# Patient Record
Sex: Female | Born: 1940
Health system: Southern US, Community
[De-identification: ages and names within clinical notes are randomized; demographics above are authoritative.]

## PROBLEM LIST (undated history)

## (undated) DIAGNOSIS — G8929 Other chronic pain: Secondary | ICD-10-CM

## (undated) DIAGNOSIS — M549 Dorsalgia, unspecified: Secondary | ICD-10-CM

## (undated) DIAGNOSIS — C801 Malignant (primary) neoplasm, unspecified: Secondary | ICD-10-CM

## (undated) DIAGNOSIS — N200 Calculus of kidney: Secondary | ICD-10-CM

## (undated) DIAGNOSIS — J90 Pleural effusion, not elsewhere classified: Secondary | ICD-10-CM

## (undated) DIAGNOSIS — F32A Depression, unspecified: Secondary | ICD-10-CM

## (undated) DIAGNOSIS — I5030 Unspecified diastolic (congestive) heart failure: Secondary | ICD-10-CM

## (undated) DIAGNOSIS — J189 Pneumonia, unspecified organism: Secondary | ICD-10-CM

## (undated) DIAGNOSIS — Z923 Personal history of irradiation: Secondary | ICD-10-CM

## (undated) DIAGNOSIS — Z9221 Personal history of antineoplastic chemotherapy: Secondary | ICD-10-CM

## (undated) DIAGNOSIS — I509 Heart failure, unspecified: Secondary | ICD-10-CM

## (undated) DIAGNOSIS — J40 Bronchitis, not specified as acute or chronic: Secondary | ICD-10-CM

## (undated) DIAGNOSIS — E049 Nontoxic goiter, unspecified: Secondary | ICD-10-CM

## (undated) DIAGNOSIS — I499 Cardiac arrhythmia, unspecified: Secondary | ICD-10-CM

## (undated) DIAGNOSIS — F329 Major depressive disorder, single episode, unspecified: Secondary | ICD-10-CM

## (undated) DIAGNOSIS — I4891 Unspecified atrial fibrillation: Secondary | ICD-10-CM

## (undated) DIAGNOSIS — R319 Hematuria, unspecified: Secondary | ICD-10-CM

## (undated) DIAGNOSIS — G4700 Insomnia, unspecified: Secondary | ICD-10-CM

## (undated) DIAGNOSIS — K219 Gastro-esophageal reflux disease without esophagitis: Secondary | ICD-10-CM

## (undated) DIAGNOSIS — E785 Hyperlipidemia, unspecified: Secondary | ICD-10-CM

## (undated) DIAGNOSIS — R42 Dizziness and giddiness: Secondary | ICD-10-CM

## (undated) DIAGNOSIS — I10 Essential (primary) hypertension: Secondary | ICD-10-CM

## (undated) HISTORY — DX: Unspecified atrial fibrillation: I48.91

## (undated) HISTORY — PX: ABDOMINAL HYSTERECTOMY: SHX81

## (undated) HISTORY — DX: Essential (primary) hypertension: I10

## (undated) HISTORY — DX: Insomnia, unspecified: G47.00

## (undated) HISTORY — DX: Hyperlipidemia, unspecified: E78.5

## (undated) HISTORY — PX: TUBAL LIGATION: SHX77

## (undated) HISTORY — PX: CARDIAC VALVE REPLACEMENT: SHX585

## (undated) HISTORY — DX: Hematuria, unspecified: R31.9

## (undated) HISTORY — DX: Pleural effusion, not elsewhere classified: J90

## (undated) HISTORY — DX: Unspecified diastolic (congestive) heart failure: I50.30

## (undated) HISTORY — DX: Nontoxic goiter, unspecified: E04.9

## (undated) HISTORY — PX: BREAST SURGERY: SHX581

---

## 2000-02-24 DIAGNOSIS — C50919 Malignant neoplasm of unspecified site of unspecified female breast: Secondary | ICD-10-CM

## 2000-02-24 HISTORY — PX: BREAST LUMPECTOMY: SHX2

## 2000-02-24 HISTORY — DX: Malignant neoplasm of unspecified site of unspecified female breast: C50.919

## 2007-06-02 ENCOUNTER — Encounter: Payer: Self-pay | Admitting: Family Medicine

## 2009-01-29 ENCOUNTER — Encounter: Payer: Self-pay | Admitting: Family Medicine

## 2009-03-30 ENCOUNTER — Encounter: Payer: Self-pay | Admitting: Family Medicine

## 2009-06-27 ENCOUNTER — Encounter: Payer: Self-pay | Admitting: Family Medicine

## 2009-09-02 ENCOUNTER — Encounter: Payer: Self-pay | Admitting: Family Medicine

## 2009-09-24 ENCOUNTER — Encounter: Payer: Self-pay | Admitting: Family Medicine

## 2010-01-13 ENCOUNTER — Ambulatory Visit: Payer: Self-pay | Admitting: Family Medicine

## 2010-01-13 DIAGNOSIS — I10 Essential (primary) hypertension: Secondary | ICD-10-CM | POA: Insufficient documentation

## 2010-01-13 DIAGNOSIS — Z853 Personal history of malignant neoplasm of breast: Secondary | ICD-10-CM | POA: Insufficient documentation

## 2010-01-13 DIAGNOSIS — G4700 Insomnia, unspecified: Secondary | ICD-10-CM | POA: Insufficient documentation

## 2010-01-13 DIAGNOSIS — E785 Hyperlipidemia, unspecified: Secondary | ICD-10-CM | POA: Insufficient documentation

## 2010-01-22 ENCOUNTER — Encounter: Payer: Self-pay | Admitting: Family Medicine

## 2010-01-29 ENCOUNTER — Encounter: Payer: Self-pay | Admitting: Family Medicine

## 2010-03-25 NOTE — Assessment & Plan Note (Signed)
Summary: NEW PATIENT   Vital Signs:  Patient profile:   70 year old female Menstrual status:  hysterectomy Height:      67 inches Weight:      169 pounds BMI:     26.56 O2 Sat:      97 % on Room air Pulse rate:   86 / minute Pulse rhythm:   regular Resp:     16 per minute BP sitting:   130 / 90  (right arm)  Vitals Entered By: Adella Hare LPN (January 13, 2010 1:54 PM)  Nutrition Counseling: Patient's BMI is greater than 25 and therefore counseled on weight management options.  O2 Flow:  Room air CC: new patient Is Patient Diabetic? No Pain Assessment Patient in pain? no      Comments complains of feet and legs burning     Menstrual Status hysterectomy   CC:  new patient.  History of Present Illness: New pt eval, she has relocated from [place], [place] since August. One urgent care visit since then treated with z pack and home remedies for URI which has resolved. She reports difficulty with sleep but the med she is on is beneficial. She denies uncontrolled depression, but has been having to deal in recent times with the unexpected death of son. she has in the past had depression following a divorce. she states her symptoms are improving with time, espescially she is living with her sisternow. she exercises regularly, and also tries to follow a heart healthy diet. She c/o numbness and burning on the tops of her feetfor several months intermittently.She denies back pain . Pt is a breast cancer survivor, and is on no current meds, she wants to establish with oncology locally.  since the untimely death of her son, the pt had 2 eD visits where she was near syncopal, likely stress related, she has ahd a neg recent card eval  Preventive Screening-Counseling & Management  Alcohol-Tobacco     Smoking Status: never  Caffeine-Diet-Exercise     Does Patient Exercise: no      Drug Use:  no.    Current Medications (verified): 1)  Amlodipine Besy-Benazepril Hcl 5-40 Mg  Caps (Amlodipine Besy-Benazepril Hcl) .... One Cap By Mouth Once Daily 2)  Lipitor 20 Mg Tabs (Atorvastatin Calcium) .... One Tab By Mouth At Bedtime 3)  Arthrotec 75-200 Mg-Mcg Tabs (Diclofenac-Misoprostol) .... One Tab By Mouth Two Times A Day 4)  Temazepam 30 Mg Caps (Temazepam) .... One Cap By Mouth At Bedtime  Allergies (verified): 1)  ! Pcn  Past History:  Past Medical History: Hyperlipidemia  since approx 2000 Hypertension  since approx 2000 Breast ca left dx in 15-Jul-2000, radiation x 30 weeks, chemotherapy x 6 treatments Left axillary and posterior chest pain  since 2000 approx insomnia in 07/15/2004 when she got divorced osteoperosis  Past Surgical History: Left breast lumpectomy removald 8  lymph nodes 2000/07/15 Hysterectomy (partial)  Family History: mother deceased- pulmonary aneurysm died at age 76, epilepsy since age 71 father deceased- COPD deceased in his 59's four sisters- 1 COPD, 1 DM, HTN four brothers- 1 heart dz, 1 DM  Social History: Retired, Solicitor retired in 07-16-1999 Divorced One grown son One deceased son, died in Jul 15, 2009 Never Smoked Alcohol use-no Drug use-no Regular exercise-no Smoking Status:  never Drug Use:  no Does Patient Exercise:  no  Review of Systems      See HPI General:  Complains of sleep disorder. Eyes:  Denies discharge, eye pain, and  red eye. ENT:  Complains of nasal congestion and postnasal drainage; denies sinus pressure and sore throat; mild symptoms. CV:  Denies chest pain or discomfort, palpitations, and swelling of feet. Resp:  Denies cough, shortness of breath, and sputum productive. GI:  Denies abdominal pain, constipation, diarrhea, nausea, and vomiting. GU:  Denies dysuria and urinary frequency. MS:  Denies joint pain and stiffness. Derm:  Denies itching and rash. Neuro:  Complains of tingling; denies headaches, seizures, and sensation of room spinning. Psych:  Complains of depression and easily tearful; denies mental problems,  suicidal thoughts/plans, thoughts of violence, and unusual visions or sounds; grief reaction primarily. Endo:  Denies cold intolerance, excessive hunger, excessive thirst, and excessive urination. Heme:  Denies abnormal bruising and bleeding. Allergy:  Denies hives or rash and itching eyes.  Physical Exam  General:  Well-developed,well-nourished,in no acute distress; alert,appropriate and cooperative throughout examination HEENT: No facial asymmetry,  EOMI, No sinus tenderness, TM's Clear, oropharynx  pink and moist.   Chest: Clear to auscultation bilaterally.  CVS: S1, S2, No murmurs, No S3. Bilateral dorsalis pedis pulses full and normal  Abd: Soft, Nontender.  MS: Adequate ROM spine, hips, shoulders and knees.  Ext: No edema.   CNS: CN 2-12 intact, power tone and sensation normal throughout.   Skin: Intact, no visible lesions or rashes.  Psych: Good eye contact, normal affect.  Memory intact, mildly anxious not depressed appearing.    Impression & Recommendations:  Problem # 1:  INSOMNIA (ICD-780.52) Assessment Comment Only  Her updated medication list for this problem includes:    Temazepam 30 Mg Caps (Temazepam) ..... One cap by mouth at bedtime  Discussed sleep hygiene.   Problem # 2:  BREAST CANCER (ICD-174.9) Assessment: Comment Only  Orders: Oncology Referral (Oncology)establishment only, pt is 5 years out  Problem # 3:  HYPERTENSION (ICD-401.9) Assessment: Comment Only  Her updated medication list for this problem includes:    Amlodipine Besy-benazepril Hcl 5-40 Mg Caps (Amlodipine besy-benazepril hcl) ..... One cap by mouth once daily  Orders: T-Basic Metabolic Panel [phone])  BP today: 130/90 Patient advised to follow low sodium diet rich in fruit and vegetables, and to commit to at least 30 minutes 5 days per week of regular exercise , to improve blood presure control.   Problem # 4:  HYPERLIPIDEMIA (ICD-272.4) Assessment: Comment Only  Her  updated medication list for this problem includes:    Lipitor 20 Mg Tabs (Atorvastatin calcium) ..... One tab by mouth at bedtime  Orders: T-Lipid Profile ([id number]) T-Hepatic Function [id number]) Low fat dietdiscussed and encouraged  Complete Medication List: 1)  Amlodipine Besy-benazepril Hcl 5-40 Mg Caps (Amlodipine besy-benazepril hcl) .... One cap by mouth once daily 2)  Lipitor 20 Mg Tabs (Atorvastatin calcium) .... One tab by mouth at bedtime 3)  Arthrotec 75-200 Mg-mcg Tabs (Diclofenac-misoprostol) .... One tab by mouth two times a day 4)  Temazepam 30 Mg Caps (Temazepam) .... One cap by mouth at bedtime  Other Orders: T- Hemoglobin A1C ([id number]) T-Vitamin D (25-Hydroxy) [phone]) Influenza Vaccine MCR (47425)  Patient Instructions: 1)  CPE  in 4.5 months 2)  BMP prior to visit, ICD-9: 3)  Hepatic Panel prior to visit, ICD-9:  fasting in 2nd week Dec 4)  Lipid Panel prior to visit, ICD-9: 5)  HBA1C , and ViT D level   6)  Pls start calcium with D 1200mg /1000IU one gel capsule once daily. 7)  asprin 81mg  one daily for heart health. 8)  Multivitamin one daily 9)  PLS take your once monthly osteoperosis tablet 10)  Flu vaccine today 11)  You are referred to Dr Cleone Slim Prescriptions: ARTHROTEC 75-200 MG-MCG TABS (DICLOFENAC-MISOPROSTOL) one tab by mouth two times a day  #180 x 0   Entered by:   Adella Hare LPN   Authorized by:   Syliva Overman MD   Signed by:   Adella Hare LPN on 16/11/9602   Method used:   Electronically to        [facility]* (retail)             ,          Ph: [phone]       Fax: [phone]   RxID:   [id number] LIPITOR 20 MG TABS (ATORVASTATIN CALCIUM) one tab by mouth at bedtime  #90 x 0   Entered by:   Adella Hare LPN   Authorized by:   Syliva Overman MD   Signed by:   Adella Hare LPN on 32/44/0102   Method used:   Electronically to        [facility]* (retail)             ,          Ph: [phone]        Fax: [phone]   RxID:   [id number] AMLODIPINE BESY-BENAZEPRIL HCL 5-40 MG CAPS (AMLODIPINE BESY-BENAZEPRIL HCL) one cap by mouth once daily  #90 x 0   Entered by:   Adella Hare LPN   Authorized by:   Syliva Overman MD   Signed by:   Adella Hare LPN on 08/23/1599   Method used:   Electronically to        [facility]* (retail)             ,          Ph: [phone]       Fax: [phone]   RxID:   [id number]    Orders Added: 1)  New Patient 65&> [71062] 2)  T-Basic Metabolic Panel [[id number]] 3)  T-Lipid Profile [35009-38182] 4)  T-Hepatic Function [80076-22960] 5)  T- Hemoglobin A1C [83036-23375] 6)  T-Vitamin D (25-Hydroxy) [99371-69678] 7)  Influenza Vaccine MCR [00025] 8)  Oncology Referral [Oncology] 9)  New Patient Level IV [93810]   Immunizations Administered:  Influenza Vaccine # 1:    Vaccine Type: Fluvax MCR    Site: right deltoid    Mfr: novartis    Dose: 0.5 ml    Route: IM    Given by: Adella Hare LPN    Exp. Date: 06/2010    Lot #: [id number]    VIS given: 09/17/09 version given January 13, 2010.   Immunizations Administered:  Influenza Vaccine # 1:    Vaccine Type: Fluvax MCR    Site: right deltoid    Mfr: novartis    Dose: 0.5 ml    Route: IM    Given by: Adella Hare LPN    Exp. Date: 06/2010    Lot #: [id number]    VIS given: 09/17/09 version given January 13, 2010.

## 2010-03-25 NOTE — Letter (Signed)
Summary: STRESS ECHOCARDIOGRAPHY REPORT  STRESS ECHOCARDIOGRAPHY REPORT   Imported By: Lind Guest 01/22/2010 13:00:02  _____________________________________________________________________  External Attachment:    Type:   Image     Comment:   External Document

## 2010-03-25 NOTE — Letter (Signed)
Summary: progress notes  progress notes   Imported By: Lind Guest 01/22/2010 10:43:46  _____________________________________________________________________  External Attachment:    Type:   Image     Comment:   External Document

## 2010-03-25 NOTE — Letter (Signed)
Summary: TRANSFERRED RECORDS  TRANSFERRED RECORDS   Imported By: Lind Guest 01/22/2010 12:54:45  _____________________________________________________________________  External Attachment:    Type:   Image     Comment:   External Document

## 2010-03-25 NOTE — Letter (Signed)
Summary: consulting radiologists  consulting radiologists   Imported By: Lind Guest 01/22/2010 12:47:50  _____________________________________________________________________  External Attachment:    Type:   Image     Comment:   External Document

## 2010-03-27 NOTE — Letter (Signed)
Summary: [facility]   Imported By: Lind Guest 02/05/2010 10:25:03  _____________________________________________________________________  External Attachment:    Type:   Image     Comment:   External Document

## 2010-04-02 ENCOUNTER — Encounter: Payer: Self-pay | Admitting: Family Medicine

## 2010-04-02 ENCOUNTER — Ambulatory Visit (INDEPENDENT_AMBULATORY_CARE_PROVIDER_SITE_OTHER): Payer: Medicare Other

## 2010-04-02 DIAGNOSIS — R350 Frequency of micturition: Secondary | ICD-10-CM

## 2010-04-02 LAB — CONVERTED CEMR LAB
Bilirubin Urine: NEGATIVE
Glucose, Urine, Semiquant: NEGATIVE
Ketones, urine, test strip: NEGATIVE
Nitrite: NEGATIVE
Protein, U semiquant: NEGATIVE
Specific Gravity, Urine: <1.005
Urobilinogen, UA: 0.2
WBC Urine, dipstick: NEGATIVE
pH: 5.5

## 2010-04-04 ENCOUNTER — Encounter: Payer: Self-pay | Admitting: Family Medicine

## 2010-04-07 ENCOUNTER — Telehealth: Payer: Self-pay | Admitting: Family Medicine

## 2010-04-10 ENCOUNTER — Ambulatory Visit (INDEPENDENT_AMBULATORY_CARE_PROVIDER_SITE_OTHER): Payer: Medicare Other | Admitting: Family Medicine

## 2010-04-10 ENCOUNTER — Encounter: Payer: Self-pay | Admitting: Family Medicine

## 2010-04-10 DIAGNOSIS — I10 Essential (primary) hypertension: Secondary | ICD-10-CM

## 2010-04-10 DIAGNOSIS — N3000 Acute cystitis without hematuria: Secondary | ICD-10-CM | POA: Insufficient documentation

## 2010-04-10 DIAGNOSIS — E785 Hyperlipidemia, unspecified: Secondary | ICD-10-CM

## 2010-04-10 DIAGNOSIS — G4700 Insomnia, unspecified: Secondary | ICD-10-CM

## 2010-04-10 LAB — CONVERTED CEMR LAB
Bilirubin Urine: NEGATIVE
Glucose, Urine, Semiquant: NEGATIVE
Ketones, urine, test strip: NEGATIVE
Nitrite: NEGATIVE
Protein, U semiquant: NEGATIVE
Specific Gravity, Urine: 1.02
Urobilinogen, UA: 0.2
pH: 5.5

## 2010-04-10 NOTE — Assessment & Plan Note (Signed)
Summary: bladder infection   Vital Signs:  Patient profile:   70 year old female Menstrual status:  hysterectomy Weight:      173 pounds BP sitting:   120 / 82 CC: frequent urination Comments patient states she is irritated around private area, advised patient did not sound like urinary symptoms but vaginal, patient states she is already taking a vaginal cream and fluconazole prescribed by cancer doctor and she strongly feels as though she has a uti    CC:  frequent urination.  Allergies: 1)  ! Pcn   Complete Medication List: 1)  Amlodipine Besy-benazepril Hcl 5-40 Mg Caps (Amlodipine besy-benazepril hcl) .... One cap by mouth once daily 2)  Lipitor 20 Mg Tabs (Atorvastatin calcium) .... One tab by mouth at bedtime 3)  Arthrotec 75-200 Mg-mcg Tabs (Diclofenac-misoprostol) .... One tab by mouth two times a day 4)  Temazepam 30 Mg Caps (Temazepam) .... One cap by mouth at bedtime  Other Orders: Urinalysis (75643-32951) T-Culture, Urine ([id number])   Orders Added: 1)  Urinalysis [81003-65000] 2)  T-Culture, Urine [[id number]]    Laboratory Results   Urine Tests  Date/Time Received: April 02, 2010 3:34 PM  Date/Time Reported: April 02, 2010 3:34 PM   Routine Urinalysis   Color: yellow Appearance: Clear Glucose: negative   (Normal Range: Negative) Bilirubin: negative   (Normal Range: Negative) Ketone: negative   (Normal Range: Negative) Spec. Gravity: <1.005   (Normal Range: 1.003-1.035) Blood: trace-intact   (Normal Range: Negative) pH: 5.5   (Normal Range: 5.0-8.0) Protein: negative   (Normal Range: Negative) Urobilinogen: 0.2   (Normal Range: 0-1) Nitrite: negative   (Normal Range: Negative) Leukocyte Esterace: negative   (Normal Range: Negative)    Comments: advised patient no infection, trace blood, will send for culture, in meantime advised to continue cream and pill prescribed by cancer doctor, if no improvement by the first of next week to call  for ov appt Adella Hare LPN  April 04, 2010 8:24 AM

## 2010-04-12 ENCOUNTER — Encounter: Payer: Self-pay | Admitting: Family Medicine

## 2010-04-14 ENCOUNTER — Ambulatory Visit: Payer: Medicare Other | Admitting: Family Medicine

## 2010-04-16 NOTE — Progress Notes (Signed)
Summary: blood in urine  Phone Note Call from Patient   Summary of Call: the nurse told her last week that she had blood in her urine and needs you to call her back at [phone] Initial call taken by: Lind Guest,  April 07, 2010 1:39 PM  Follow-up for Phone Call        states she has vaginal infection, was advised to schedule appt this week Follow-up by: Adella Hare LPN,  April 07, 2010 1:49 PM

## 2010-04-17 ENCOUNTER — Ambulatory Visit: Payer: Medicare Other | Admitting: Family Medicine

## 2010-04-18 ENCOUNTER — Telehealth: Payer: Self-pay | Admitting: Family Medicine

## 2010-04-22 NOTE — Progress Notes (Signed)
Summary: medicine  Phone Note Call from Patient   Summary of Call: the medicine that you gave her for bladder infection she just took the last and needs some more called into [facility] in [place].If any ? please call back at [phone] Initial call taken by: Lind Guest,  April 18, 2010 10:38 AM  Follow-up for Phone Call        pls refill cipro x 1 and let her know she did have a UTI with 2 different organisms, both asensitive to the cipro, pls also tell her to resubmit urine c/s next Tuesday if she feels she is still not doing well, she needs to call and let you know so you can order the test Follow-up by: Syliva Overman MD,  April 18, 2010 3:02 PM  Additional Follow-up for Phone Call Additional follow up Details #1::        patient aware Additional Follow-up by: Adella Hare LPN,  April 18, 2010 4:00 PM    Prescriptions: CIPROFLOXACIN HCL 500 MG TABS (CIPROFLOXACIN HCL) Take 1 tablet by mouth two times a day  #14 x 0   Entered by:   Adella Hare LPN   Authorized by:   Syliva Overman MD   Signed by:   Adella Hare LPN on 41/32/4401   Method used:   Electronically to        [facility] (retail)       [address]       [place], [place]  [address]       Ph: [phone]       Fax: [phone]   RxID:   [id number]

## 2010-04-22 NOTE — Assessment & Plan Note (Signed)
Summary: office visit   Vital Signs:  Patient profile:   70 year old female Menstrual status:  hysterectomy Height:      67 inches Weight:      169 pounds BMI:     26.56 Pulse rate:   80 / minute Pulse rhythm:   regular Resp:     16 per minute BP sitting:   130 / 90  (left arm) Cuff size:   regular  Vitals Entered By: Everitt Amber LPN (April 10, 2010 3:34 PM)  Nutrition Counseling: Patient's BMI is greater than 25 and therefore counseled on weight management options. CC: c/o having a scratchy feeling in her vaginal area, no pain or burning with urination. x 3 weeks and some urinary frequency    CC:  c/o having a scratchy feeling in her vaginal area and no pain or burning with urination. x 3 weeks and some urinary frequency .  History of Present Illness: 3 week h/o dysuria and frequency, no flank paij , fever or chills. Increased tearfullness and grief,this is the 1 year anniversarry of her son's untimely death.  Allergies: 1)  ! Pcn  Review of Systems      See HPI General:  Complains of fatigue, loss of appetite, and sleep disorder. ENT:  Complains of postnasal drainage; denies hoarseness, nasal congestion, and sinus pressure. CV:  Denies chest pain or discomfort, palpitations, and swelling of feet. Resp:  Denies cough and sputum productive. GI:  Denies abdominal pain, constipation, diarrhea, nausea, and vomiting. GU:  Complains of dysuria, hematuria, and urinary frequency. MS:  Denies joint pain and stiffness. Psych:  Complains of depression and easily tearful; denies suicidal thoughts/plans, thoughts of violence, and unusual visions or sounds; acutely grieving the loss of her son who passed 1 year ago in February. Endo:  Denies cold intolerance, excessive hunger, excessive thirst, and excessive urination. Heme:  Denies abnormal bruising and bleeding. Allergy:  Denies hives or rash and itching eyes.  Physical Exam  General:  Well-developed,well-nourished,in no acute  distress; alert,appropriate and cooperative throughout examination HEENT: No facial asymmetry,  EOMI, No sinus tenderness, TM's Clear, oropharynx  pink and moist.   Chest: Clear to auscultation bilaterally.  CVS: S1, S2, No murmurs, No S3.   Abd: Soft, mild suprapubic tenderness, no renal angle tenderness MS: Adequate ROM spine, hips, shoulders and knees.  Ext: No edema.   CNS: CN 2-12 intact, power tone and sensation normal throughout.   Skin: Intact, no visible lesions or rashes.  Psych: Good eye contact, normal affect.  Memory intact,  depressed appearing.    Impression & Recommendations:  Problem # 1:  ACUTE CYSTITIS (ICD-595.0) Assessment Comment Only  Her updated medication list for this problem includes:    Ciprofloxacin Hcl 500 Mg Tabs (Ciprofloxacin hcl) .Marland Kitchen... Take 1 tablet by mouth two times a day  Orders: Medicare Electronic Prescription [phone]) T-Culture, Urine ([id number]) UA Dipstick W/ Micro (manual) (57846)  Encouraged to push clear liquids, get enough rest, and take acetaminophen as needed. To be seen in 10 days if no improvement, sooner if worse.  Problem # 2:  HYPERTENSION (ICD-401.9) Assessment: Deteriorated  Her updated medication list for this problem includes:    Amlodipine Besy-benazepril Hcl 5-40 Mg Caps (Amlodipine besy-benazepril hcl) ..... One cap by mouth once daily  BP today: 130/90 Prior BP: 120/82 (04/02/2010)  Problem # 3:  HYPERLIPIDEMIA (ICD-272.4) Assessment: Comment Only  Her updated medication list for this problem includes:    Lipitor 20 Mg Tabs (Atorvastatin calcium) .Marland KitchenMarland KitchenMarland KitchenMarland Kitchen  One tab by mouth at bedtime needs fasting labs in the near future Low fat dietdiscussed and encouraged  Complete Medication List: 1)  Amlodipine Besy-benazepril Hcl 5-40 Mg Caps (Amlodipine besy-benazepril hcl) .... One cap by mouth once daily 2)  Lipitor 20 Mg Tabs (Atorvastatin calcium) .... One tab by mouth at bedtime 3)  Arthrotec 75-200 Mg-mcg Tabs  (Diclofenac-misoprostol) .... One tab by mouth two times a day 4)  Temazepam 30 Mg Caps (Temazepam) .... One cap by mouth at bedtime 5)  Ciprofloxacin Hcl 500 Mg Tabs (Ciprofloxacin hcl) .... Take 1 tablet by mouth two times a day 6)  Fluconazole 150 Mg Tabs (Fluconazole) .... Take 1 tablet by mouth once a day as needed for vag itching  Patient Instructions: 1)  f/u as before. 2)  you are being treated for acute cystitis (Bladder infection) Antibiotics are sent in, and your urine will be sent for fi-urther testing 3)  Take your antibiotic as prescribed until ALL of it is gone, but stop if you develop a rash or swelling and contact our office as soon as possible. 4)  pls drink alot of fluids/water. 5)  pLs  consider setiously joinng a support group for grieving parents i will give you the contact info. Prescriptions: FLUCONAZOLE 150 MG TABS (FLUCONAZOLE) Take 1 tablet by mouth once a day as needed for vag itching  #3 x 0   Entered by:   Everitt Amber LPN   Authorized by:   Syliva Overman MD   Signed by:   Everitt Amber LPN on 46/96/2952   Method used:   Electronically to        [facility] (retail)       [address]       [place], [place]  [address]       Ph: [phone]       Fax: [phone]   RxID:   [id number] CIPROFLOXACIN HCL 500 MG TABS (CIPROFLOXACIN HCL) Take 1 tablet by mouth two times a day  #14 x 0   Entered by:   Everitt Amber LPN   Authorized by:   Syliva Overman MD   Signed by:   Everitt Amber LPN on 88/41/6606   Method used:   Electronically to        [facility] (retail)       [address]       [place], [place]  [address]       Ph: [phone]       Fax: [phone]   RxID:   [id number]    Orders Added: 1)  Est. Patient Level IV [37106] 2)  Medicare Electronic Prescription [G8553] 3)  T-Culture, Urine [[id number]] 4)  UA Dipstick W/ Micro (manual) [81000]    Laboratory Results     Urine Tests    Routine Urinalysis   Color: lt. yellow Appearance: Clear Glucose: negative   (Normal Range: Negative) Bilirubin: negative   (Normal Range: Negative) Ketone: negative   (Normal Range: Negative) Spec. Gravity: 1.020   (Normal Range: 1.003-1.035) Blood: trace-intact   (Normal Range: Negative) pH: 5.5   (Normal Range: 5.0-8.0) Protein: negative   (Normal Range: Negative) Urobilinogen: 0.2   (Normal Range: 0-1) Nitrite: negative   (Normal Range: Negative) Leukocyte Esterace: trace   (Normal Range: Negative)

## 2010-05-01 ENCOUNTER — Telehealth: Payer: Self-pay | Admitting: Family Medicine

## 2010-05-06 NOTE — Progress Notes (Signed)
Summary: yeast infection  Phone Note Call from Patient   Summary of Call: yeast infection has came back could you please call in some medicine that is a little different at [facility] Initial call taken by: Lind Guest,  May 01, 2010 2:35 PM  Follow-up for Phone Call        does she want a cream to insert???, that is the prescription tab, where is the infectiopn, mouthor vaginal canal???let her know med will be sent tomorrow Follow-up by: Syliva Overman MD,  May 01, 2010 3:52 PM  Additional Follow-up for Phone Call Additional follow up Details #1::        She just wanted some cream, not to insert, but to rub on the outside. Doesn't burn when she urinates but just has been bothering her (irritated) in that area. [facility] Additional Follow-up by: Everitt Amber LPN,  May 01, 2010 4:00 PM    Additional Follow-up for Phone Call Additional follow up Details #2::    otc monistat is recommended for yeast on the skin, no script needed pls let her know Follow-up by: Syliva Overman MD,  May 02, 2010 4:56 AM  Additional Follow-up for Phone Call Additional follow up Details #3:: Details for Additional Follow-up Action Taken: Patient aware Additional Follow-up by: Everitt Amber LPN,  May 02, 2010 7:57 AM

## 2010-06-04 ENCOUNTER — Telehealth: Payer: Self-pay | Admitting: Family Medicine

## 2010-06-04 MED ORDER — TEMAZEPAM 30 MG PO CAPS: 30.0000 mg | ORAL_CAPSULE | Freq: Every evening | ORAL | Status: DC | PRN

## 2010-06-04 NOTE — Telephone Encounter (Signed)
Sent as requested.

## 2010-06-10 ENCOUNTER — Encounter: Payer: Self-pay | Admitting: Family Medicine

## 2010-06-11 ENCOUNTER — Ambulatory Visit (INDEPENDENT_AMBULATORY_CARE_PROVIDER_SITE_OTHER): Payer: Medicare Other | Admitting: Family Medicine

## 2010-06-11 ENCOUNTER — Other Ambulatory Visit: Payer: Self-pay | Admitting: Family Medicine

## 2010-06-11 ENCOUNTER — Telehealth: Payer: Self-pay | Admitting: Family Medicine

## 2010-06-11 ENCOUNTER — Encounter: Payer: Self-pay | Admitting: Family Medicine

## 2010-06-11 DIAGNOSIS — Z13820 Encounter for screening for osteoporosis: Secondary | ICD-10-CM

## 2010-06-11 DIAGNOSIS — Z139 Encounter for screening, unspecified: Secondary | ICD-10-CM

## 2010-06-11 DIAGNOSIS — I10 Essential (primary) hypertension: Secondary | ICD-10-CM

## 2010-06-11 DIAGNOSIS — R5381 Other malaise: Secondary | ICD-10-CM

## 2010-06-11 DIAGNOSIS — R5383 Other fatigue: Secondary | ICD-10-CM

## 2010-06-11 DIAGNOSIS — E785 Hyperlipidemia, unspecified: Secondary | ICD-10-CM

## 2010-06-11 MED ORDER — TEMAZEPAM 30 MG PO CAPS: 30.0000 mg | ORAL_CAPSULE | Freq: Every evening | ORAL | Status: DC | PRN

## 2010-06-11 NOTE — Patient Instructions (Signed)
Mammogram due September 03, 2010 or after, we will schedule.   Benadryl is good for allergies if excessive, I suggest you take at bedtime since this has a sedative effect.  Pls dicontinue daily use of arthrotec, your pain is fleeting and intermittent.Use only if needed  Pls start calcium with D 1200mg /1000IU one daily , gel capsule , this is good for your bones  One multivitamin once daily is recommended   CBC , fasting chem 7 , lipid , hepatic , Vit D and tSH  As soon as possible  F/u in 5 months

## 2010-06-12 NOTE — Telephone Encounter (Signed)
Noted, thanks!

## 2010-06-15 NOTE — Progress Notes (Signed)
  Subjective:    Patient ID: Theresa Garcia, female    DOB: [birth year], 70 y.o.   MRN: [id number]  HPI The PT is here for follow up and re-evaluation of chronic medical conditions, medication management and review of recent lab and radiology data.  Preventive health is updated, specifically  Cancer screening, Osteoporosis screening and Immunization.   Questions or concerns regarding consultations or procedures which the PT has had in the interim are  addressed. The PT denies any adverse reactions to current medications since the last visit.  There are no new concerns.  There are no specific complaints       Review of Systems Denies recent fever or chills. Denies sinus pressure, nasal congestion, ear pain or sore throat. Denies chest congestion, productive cough or wheezing. Denies chest pains, palpitations, paroxysmal nocturnal dyspnea, orthopnea and leg swelling Denies abdominal pain, nausea, vomiting,diarrhea or constipation.  Denies rectal bleeding or change in bowel movement. Denies dysuria, frequency, hesitancy or incontinence. Denies joint pain, swelling and limitation and mobility. Denies headaches, seizure, numbness, or tingling. Denies depression, anxiety or insomnia. Denies skin break down or rash.        Objective:   Physical Exam Patient alert and oriented and in no Cardiopulmonary distress.  HEENT: No facial asymmetry, EOMI, no sinus tenderness, TM's clear, Oropharynx pink and moist.  Neck supple no adenopathy.  Chest: Clear to auscultation bilaterally.  CVS: S1, S2 no murmurs, no S3.  ABD: Soft non tender. Bowel sounds normal.  Ext: No edema  MS: Adequate ROM spine, shoulders, hips and knees.  Skin: Intact, no ulcerations or rash noted.  Psych: Good eye contact, normal affect. Memory intact not anxious or depressed appearing.  CNS: CN 2-12 intact, power, tone and sensation normal throughout.        Assessment & Plan:    1.Hypertension:Controlled, no changes in medication.  2. Hyperlipidemia: ;labs to be obtained, low fat diet encouraged 3. Depression and grief: improving over time, excellent family support 4.Allergies: increased , otc benadryl as needed Insomnia: improved and controlled wit medication

## 2010-06-20 ENCOUNTER — Encounter: Payer: Self-pay | Admitting: Family Medicine

## 2010-06-23 ENCOUNTER — Encounter: Payer: Self-pay | Admitting: Family Medicine

## 2010-08-06 ENCOUNTER — Telehealth: Payer: Self-pay | Admitting: Family Medicine

## 2010-08-06 MED ORDER — ATORVASTATIN CALCIUM 20 MG PO TABS: ORAL_TABLET | ORAL | Status: DC

## 2010-08-06 NOTE — Telephone Encounter (Signed)
Sent to [facility] 

## 2010-09-08 ENCOUNTER — Ambulatory Visit (HOSPITAL_COMMUNITY): Payer: Medicare Other

## 2010-11-10 ENCOUNTER — Encounter: Payer: Self-pay | Admitting: Family Medicine

## 2010-11-11 ENCOUNTER — Encounter: Payer: Self-pay | Admitting: Family Medicine

## 2010-11-11 ENCOUNTER — Ambulatory Visit: Payer: Medicare Other | Admitting: Family Medicine

## 2010-11-21 ENCOUNTER — Encounter: Payer: Self-pay | Admitting: Family Medicine

## 2010-11-24 ENCOUNTER — Ambulatory Visit (INDEPENDENT_AMBULATORY_CARE_PROVIDER_SITE_OTHER): Payer: Medicare Other | Admitting: Family Medicine

## 2010-11-24 ENCOUNTER — Encounter: Payer: Self-pay | Admitting: Family Medicine

## 2010-11-24 DIAGNOSIS — I10 Essential (primary) hypertension: Secondary | ICD-10-CM

## 2010-11-24 DIAGNOSIS — M899 Disorder of bone, unspecified: Secondary | ICD-10-CM

## 2010-11-24 DIAGNOSIS — R5383 Other fatigue: Secondary | ICD-10-CM

## 2010-11-24 DIAGNOSIS — E785 Hyperlipidemia, unspecified: Secondary | ICD-10-CM

## 2010-11-24 DIAGNOSIS — G4700 Insomnia, unspecified: Secondary | ICD-10-CM

## 2010-11-24 DIAGNOSIS — J40 Bronchitis, not specified as acute or chronic: Secondary | ICD-10-CM

## 2010-11-24 DIAGNOSIS — R5381 Other malaise: Secondary | ICD-10-CM

## 2010-11-24 LAB — CBC WITH DIFFERENTIAL/PLATELET
Basophils Absolute: 0 10*3/uL (ref 0.0–0.1)
Basophils Relative: 0 % (ref 0–1)
Eosinophils Absolute: 0.4 10*3/uL (ref 0.0–0.7)
Eosinophils Relative: 4 % (ref 0–5)
HCT: 41.5 % (ref 36.0–46.0)
Hemoglobin: 13.3 g/dL (ref 12.0–15.0)
Lymphocytes Relative: 26 % (ref 12–46)
Lymphs Abs: 2.3 10*3/uL (ref 0.7–4.0)
MCH: 30.5 pg (ref 26.0–34.0)
MCHC: 32 g/dL (ref 30.0–36.0)
MCV: 95.2 fL (ref 78.0–100.0)
Monocytes Absolute: 0.5 10*3/uL (ref 0.1–1.0)
Monocytes Relative: 5 % (ref 3–12)
Neutro Abs: 5.9 10*3/uL (ref 1.7–7.7)
Neutrophils Relative %: 65 % (ref 43–77)
Platelets: 337 10*3/uL (ref 150–400)
RBC: 4.36 MIL/uL (ref 3.87–5.11)
RDW: 13.9 % (ref 11.5–15.5)
WBC: 9.1 10*3/uL (ref 4.0–10.5)

## 2010-11-24 LAB — LIPID PANEL
Cholesterol: 179 mg/dL (ref 0–200)
HDL: 52 mg/dL (ref >39)
LDL Cholesterol: 106 mg/dL — ABNORMAL HIGH (ref 0–99)
Total CHOL/HDL Ratio: 3.4 Ratio
Triglycerides: 106 mg/dL (ref <150)
VLDL: 21 mg/dL (ref 0–40)

## 2010-11-24 LAB — HEPATIC FUNCTION PANEL
ALT: 21 U/L (ref 0–35)
AST: 24 U/L (ref 0–37)
Albumin: 4.4 g/dL (ref 3.5–5.2)
Alkaline Phosphatase: 58 U/L (ref 39–117)
Bilirubin, Direct: 0.1 mg/dL (ref 0.0–0.3)
Indirect Bilirubin: 0.4 mg/dL (ref 0.0–0.9)
Total Bilirubin: 0.5 mg/dL (ref 0.3–1.2)
Total Protein: 7.3 g/dL (ref 6.0–8.3)

## 2010-11-24 LAB — TSH: TSH: 0.656 u[IU]/mL (ref 0.350–4.500)

## 2010-11-24 MED ORDER — FLUCONAZOLE 150 MG PO TABS: 150.0000 mg | ORAL_TABLET | Freq: Once | ORAL | Status: AC

## 2010-11-24 MED ORDER — SULFAMETHOXAZOLE-TRIMETHOPRIM 800-160 MG PO TABS: 1.0000 | ORAL_TABLET | Freq: Two times a day (BID) | ORAL | Status: AC

## 2010-11-24 MED ORDER — BENZONATATE 100 MG PO CAPS: 100.0000 mg | ORAL_CAPSULE | Freq: Four times a day (QID) | ORAL | Status: DC | PRN

## 2010-11-24 NOTE — Assessment & Plan Note (Signed)
Controlled pt also practices good sleep hygiene

## 2010-11-24 NOTE — Progress Notes (Signed)
  Subjective:    Patient ID: Theresa Garcia, female    DOB: 1940-10-27, 70 y.o.   MRN: [id number]  HPI 2 week h/o head and chest congestion, sputum is cream and thick, she has post nasal drainage which is foull tasting and smelling, she has had intermittent chills, low grade temp last week. Otherwise has been fairly well Labs still outstanding but will get them today.      Review of Systems See HPI  Denies chest pains, palpitations and leg swelling Denies abdominal pain, nausea, vomiting,diarrhea or constipation.   Denies dysuria, frequency, hesitancy or incontinence. Denies joint pain, swelling and limitation in mobility. Denies headaches, seizures, numbness, or tingling. Denies depression, anxiety or insomnia. Denies skin break down or rash.        Objective:   Physical Exam Patient alert and oriented and in no cardiopulmonary distress.  HEENT: No facial asymmetry, EOMI, frontal and maxillary sinus tenderness,  oropharynx pink and moist.  Neck supple no adenopathy.  Chest: decreased air entry bilateral crackles and wheezes  CVS: S1, S2 no murmurs, no S3.  ABD: Soft non tender. Bowel sounds normal.  Ext: No edema  MS: Adequate ROM spine, shoulders, hips and knees.  Skin: Intact, no ulcerations or rash noted.  Psych: Good eye contact, normal affect. Memory intact not anxious or depressed appearing.  CNS: CN 2-12 intact, power, tone and sensation normal throughout.        Assessment & Plan:

## 2010-11-24 NOTE — Assessment & Plan Note (Signed)
Uncontrolled. Medication compliance addressed. Commitment to regular exercise, and healthy  eating habits with portion control discussed. DASH diet, and low fat diet discussed, and literature offered. No changes in medication at this time.  

## 2010-11-24 NOTE — Patient Instructions (Addendum)
F/u in 4 months.  Nurse visit for flu vaccine in 3 to 4weeks  You are being treated for acute bronchitis and sinusiits, medication is sent to your pharmacy, call if symptoms persist after approx 10 days   Fasting labs today.  No changes in regular medications  Blood pressure is high today, pls follow the DASH diet, and start daily exercise when you feel better

## 2010-11-24 NOTE — Assessment & Plan Note (Signed)
2 week h/o head congestion and productive cough, will prescribe med

## 2010-11-24 NOTE — Assessment & Plan Note (Signed)
Low fat diet encouraged labs today

## 2010-11-25 LAB — VITAMIN D 25 HYDROXY (VIT D DEFICIENCY, FRACTURES): Vit D, 25-Hydroxy: 20 ng/mL — ABNORMAL LOW (ref 30–89)

## 2010-11-28 ENCOUNTER — Other Ambulatory Visit: Payer: Self-pay

## 2010-11-28 MED ORDER — ATORVASTATIN CALCIUM 20 MG PO TABS: ORAL_TABLET | ORAL | Status: DC

## 2010-11-28 MED ORDER — SUCRALFATE 1 G PO TABS: ORAL_TABLET | ORAL | Status: DC

## 2010-11-28 MED ORDER — TEMAZEPAM 30 MG PO CAPS: 30.0000 mg | ORAL_CAPSULE | Freq: Every evening | ORAL | Status: DC | PRN

## 2010-11-28 MED ORDER — AMLODIPINE BESY-BENAZEPRIL HCL 5-40 MG PO CAPS: 1.0000 | ORAL_CAPSULE | Freq: Every day | ORAL | Status: DC

## 2010-12-01 ENCOUNTER — Other Ambulatory Visit: Payer: Self-pay | Admitting: Family Medicine

## 2010-12-02 ENCOUNTER — Telehealth: Payer: Self-pay | Admitting: *Deleted

## 2010-12-02 NOTE — Telephone Encounter (Signed)
Patient aware of lab results.

## 2010-12-02 NOTE — Telephone Encounter (Signed)
Message copied by Diamantina Monks on Tue Dec 02, 2010 10:26 AM ------      Message from: Syliva Overman MD E      Created: Mon Dec 01, 2010 10:35 AM       pls advise labs are good, except bad cholesterol a little too high, cut back on fried and fatty foods, no med changes.      Her vit D is low needs once weekly supplement, for at least 24  weeks then rept test, pls order the vit D       She needs to ensure she take calcium 1200mg  once daily and vit D 800IU once daily.      I am sending in the vit D, let her know

## 2010-12-18 ENCOUNTER — Ambulatory Visit (INDEPENDENT_AMBULATORY_CARE_PROVIDER_SITE_OTHER): Payer: Medicare Other

## 2010-12-18 DIAGNOSIS — Z23 Encounter for immunization: Secondary | ICD-10-CM

## 2011-01-05 ENCOUNTER — Encounter: Payer: Self-pay | Admitting: Family Medicine

## 2011-01-06 ENCOUNTER — Ambulatory Visit (INDEPENDENT_AMBULATORY_CARE_PROVIDER_SITE_OTHER): Payer: Medicare Other | Admitting: Family Medicine

## 2011-01-06 ENCOUNTER — Encounter (HOSPITAL_COMMUNITY): Payer: Self-pay | Admitting: Unknown Physician Specialty

## 2011-01-06 ENCOUNTER — Inpatient Hospital Stay (HOSPITAL_COMMUNITY)
Admission: AD | Admit: 2011-01-06 | Discharge: 2011-01-08 | DRG: 669 | Disposition: A | Discharge status: Home / Self Care | Payer: Medicare Other | Source: Ambulatory Visit | Attending: Internal Medicine | Admitting: Internal Medicine

## 2011-01-06 ENCOUNTER — Encounter: Payer: Self-pay | Admitting: Family Medicine

## 2011-01-06 VITALS — BP 150/90 | HR 74 | Resp 16 | Ht 67.0 in | Wt 175.0 lb

## 2011-01-06 DIAGNOSIS — J40 Bronchitis, not specified as acute or chronic: Secondary | ICD-10-CM

## 2011-01-06 DIAGNOSIS — N83209 Unspecified ovarian cyst, unspecified side: Secondary | ICD-10-CM | POA: Diagnosis present

## 2011-01-06 DIAGNOSIS — N3000 Acute cystitis without hematuria: Secondary | ICD-10-CM

## 2011-01-06 DIAGNOSIS — I10 Essential (primary) hypertension: Secondary | ICD-10-CM

## 2011-01-06 DIAGNOSIS — G4700 Insomnia, unspecified: Secondary | ICD-10-CM | POA: Diagnosis present

## 2011-01-06 DIAGNOSIS — Z853 Personal history of malignant neoplasm of breast: Secondary | ICD-10-CM | POA: Diagnosis present

## 2011-01-06 DIAGNOSIS — E785 Hyperlipidemia, unspecified: Secondary | ICD-10-CM | POA: Diagnosis present

## 2011-01-06 DIAGNOSIS — N201 Calculus of ureter: Secondary | ICD-10-CM

## 2011-01-06 DIAGNOSIS — N23 Unspecified renal colic: Secondary | ICD-10-CM

## 2011-01-06 DIAGNOSIS — R112 Nausea with vomiting, unspecified: Secondary | ICD-10-CM | POA: Diagnosis present

## 2011-01-06 DIAGNOSIS — E860 Dehydration: Secondary | ICD-10-CM | POA: Diagnosis present

## 2011-01-06 DIAGNOSIS — C50919 Malignant neoplasm of unspecified site of unspecified female breast: Secondary | ICD-10-CM

## 2011-01-06 DIAGNOSIS — N1330 Unspecified hydronephrosis: Secondary | ICD-10-CM | POA: Diagnosis present

## 2011-01-06 HISTORY — DX: Pneumonia, unspecified organism: J18.9

## 2011-01-06 HISTORY — DX: Malignant (primary) neoplasm, unspecified: C80.1

## 2011-01-06 HISTORY — DX: Gastro-esophageal reflux disease without esophagitis: K21.9

## 2011-01-06 HISTORY — DX: Bronchitis, not specified as acute or chronic: J40

## 2011-01-06 LAB — COMPREHENSIVE METABOLIC PANEL
ALT: 11 U/L (ref 0–35)
AST: 14 U/L (ref 0–37)
Albumin: 3.7 g/dL (ref 3.5–5.2)
Alkaline Phosphatase: 62 U/L (ref 39–117)
BUN: 4 mg/dL — ABNORMAL LOW (ref 6–23)
CO2: 25 mEq/L (ref 19–32)
Calcium: 10.2 mg/dL (ref 8.4–10.5)
Chloride: 108 mEq/L (ref 96–112)
Creatinine, Ser: 0.78 mg/dL (ref 0.50–1.10)
GFR calc Af Amer: >90 mL/min (ref >90)
GFR calc non Af Amer: 85 mL/min — ABNORMAL LOW (ref >90)
Glucose, Bld: 99 mg/dL (ref 70–99)
Potassium: 3.7 mEq/L (ref 3.5–5.1)
Sodium: 143 mEq/L (ref 135–145)
Total Bilirubin: 0.2 mg/dL — ABNORMAL LOW (ref 0.3–1.2)
Total Protein: 7.6 g/dL (ref 6.0–8.3)

## 2011-01-06 LAB — URINALYSIS, ROUTINE W REFLEX MICROSCOPIC
Bilirubin Urine: NEGATIVE
Glucose, UA: NEGATIVE mg/dL
Hgb urine dipstick: NEGATIVE
Ketones, ur: NEGATIVE mg/dL
Leukocytes, UA: NEGATIVE
Nitrite: NEGATIVE
Protein, ur: NEGATIVE mg/dL
Specific Gravity, Urine: 1.015 (ref 1.005–1.030)
Urobilinogen, UA: 0.2 mg/dL (ref 0.0–1.0)
pH: 6 (ref 5.0–8.0)

## 2011-01-06 LAB — CBC
HCT: 39.3 % (ref 36.0–46.0)
Hemoglobin: 12.8 g/dL (ref 12.0–15.0)
MCH: 30.3 pg (ref 26.0–34.0)
MCHC: 32.6 g/dL (ref 30.0–36.0)
MCV: 93.1 fL (ref 78.0–100.0)
Platelets: 290 10*3/uL (ref 150–400)
RBC: 4.22 MIL/uL (ref 3.87–5.11)
RDW: 13.3 % (ref 11.5–15.5)
WBC: 7.4 10*3/uL (ref 4.0–10.5)

## 2011-01-06 LAB — DIFFERENTIAL
Basophils Absolute: 0 10*3/uL (ref 0.0–0.1)
Basophils Relative: 0 % (ref 0–1)
Eosinophils Absolute: 0.1 10*3/uL (ref 0.0–0.7)
Eosinophils Relative: 1 % (ref 0–5)
Lymphocytes Relative: 32 % (ref 12–46)
Lymphs Abs: 2.4 10*3/uL (ref 0.7–4.0)
Monocytes Absolute: 0.4 10*3/uL (ref 0.1–1.0)
Monocytes Relative: 6 % (ref 3–12)
Neutro Abs: 4.5 10*3/uL (ref 1.7–7.7)
Neutrophils Relative %: 61 % (ref 43–77)

## 2011-01-06 MED ORDER — ACETAMINOPHEN 325 MG PO TABS: 650.0000 mg | ORAL_TABLET | Freq: Four times a day (QID) | ORAL | Status: DC | PRN

## 2011-01-06 MED ORDER — ONDANSETRON HCL 4 MG PO TABS: 4.0000 mg | ORAL_TABLET | Freq: Four times a day (QID) | ORAL | Status: DC | PRN

## 2011-01-06 MED ORDER — SODIUM CHLORIDE 0.9 % IJ SOLN
INTRAMUSCULAR | Status: AC
  Filled 2011-01-06: qty 3

## 2011-01-06 MED ORDER — ACETAMINOPHEN 650 MG RE SUPP: 650.0000 mg | Freq: Four times a day (QID) | RECTAL | Status: DC | PRN

## 2011-01-06 MED ORDER — SODIUM CHLORIDE 0.9 % IV SOLN
INTRAVENOUS | Status: DC
  Administered 2011-01-06 – 2011-01-07 (×2): via INTRAVENOUS

## 2011-01-06 MED ORDER — HYDROMORPHONE HCL PF 1 MG/ML IJ SOLN
1.0000 mg | INTRAMUSCULAR | Status: DC | PRN
  Administered 2011-01-06 – 2011-01-08 (×4): 1 mg via INTRAVENOUS
  Filled 2011-01-06 (×4): qty 1

## 2011-01-06 MED ORDER — ONDANSETRON HCL 4 MG/2ML IJ SOLN
4.0000 mg | Freq: Four times a day (QID) | INTRAMUSCULAR | Status: DC | PRN
  Administered 2011-01-06 – 2011-01-07 (×2): 4 mg via INTRAVENOUS
  Filled 2011-01-06 (×2): qty 2

## 2011-01-06 MED ORDER — ALUM & MAG HYDROXIDE-SIMETH 200-200-20 MG/5ML PO SUSP: 30.0000 mL | Freq: Four times a day (QID) | ORAL | Status: DC | PRN

## 2011-01-06 MED ORDER — CIPROFLOXACIN IN D5W 200 MG/100ML IV SOLN
200.0000 mg | INTRAVENOUS | Status: DC
  Filled 2011-01-06: qty 100

## 2011-01-06 MED ORDER — SODIUM CHLORIDE 0.9 % IV BOLUS (SEPSIS)
500.0000 mL | Freq: Once | INTRAVENOUS | Status: AC
  Administered 2011-01-06: 500 mL via INTRAVENOUS

## 2011-01-06 MED ORDER — SENNA 8.6 MG PO TABS: 2.0000 | ORAL_TABLET | Freq: Every day | ORAL | Status: DC | PRN

## 2011-01-06 NOTE — Discharge Summary (Signed)
NAMEMarland Kitchen  ASHANTY, COLTRANE              ACCOUNT NO.:  [id number]  MEDICAL RECORD NO.:  [id number]  LOCATION:  A340                          FACILITY:  [facility]  PHYSICIAN:  Ky Barban, M.D.DATE OF BIRTH:  04-12-1940  DATE OF ADMISSION:  01/06/2011 DATE OF DISCHARGE:  LH                         DISCHARGE SUMMARY-REFERRING   CHIEF COMPLAINT:  Recurrent left renal colic.  HISTORY:  A 70 year old female patient under of Dr. Lodema Hong.  For the last 1 week, she is having recurrent episodes of left renal colic associated with nausea, vomiting, and she initially thought she will get better, did not do anything, eventually went to the emergency room in [facility] where CT scan shows there is a 5 mm stone on the left mid ureter causing hydroureter nephrosis.  She continued to have pain, so she was admitted today in [facility] for further management and I was asked by Dr. Lodema Hong to see her.  She is not running any fever or chills.  No history of kidney stones in the past.  PAST MEDICAL HISTORY:  History of hypertension, breast CA.  SURGICAL HISTORY:  TAH, left lumpectomy.  SOCIAL HISTORY:  She is divorced, nonsmoker, never took any drugs, and alcohol use none.  REVIEW OF SYSTEMS:  Denies any chest pain, orthopnea, PND.  Does have nausea and vomiting.  PHYSICAL EXAMINATION:  GENERAL:  Moderately built pleasant female not in acute distress. VITAL SIGNS:  Blood pressure is 155/100, temperature is 98.3, pulse 78 per minute. CENTRAL NERVOUS SYSTEM:  Negative. HEAD, NECK, EYE, ENT:  Negative. CHEST:  Symmetrical. HEART:  Regular sinus rhythm. ABDOMEN:  Soft, flat.  Liver, spleen, kidneys not palpable, 1+ left CVA tenderness. PELVIC:  Deferred. EXTREMITIES:  Normal.  IMPRESSION: 1. Left ureteral calculus. 2. Hypertension.  Recommend cystoscopy, left retrograde pyelogram,     ureteroscopic stone basket extraction, holmium laser lithotripsy,     insertion of double-J  stent under anesthesia in the morning.  I had     long discussion with the patient and her family which included her     brother and 2 sisters and told them the procedure of stone baskets,     possible complications which include:     a.     Ureteral perforation leading to open surgery, use of double-      J stent, stone migration.  They understand and wanted me to go      ahead and proceed.     b.     The second choice I gave her not to do anything but she was      very sick, she wanted to have done something.  Also we discussed      about ESL.  After I provided the information, they wanted to go      ahead and have stone basket which we will do in the morning.     Ky Barban, M.D.     MIJ/MEDQ  D:  01/06/2011  T:  01/06/2011  Job:  [id number]

## 2011-01-06 NOTE — Patient Instructions (Signed)
F/u in 5 to 6 weeks  You are  Being directly admitted to the [facility] to Dr Pincus Sanes care. Dr Frann Rider, a urologist will see you when you are there also.  You will feel better soon

## 2011-01-06 NOTE — Discharge Summary (Signed)
NAMEMarland Kitchen  Garcia, Theresa              ACCOUNT NO.:  [id number]  MEDICAL RECORD NO.:  [id number]  LOCATION:  A340                          FACILITY:  [facility]  PHYSICIAN:  Ky Barban, M.D.DATE OF BIRTH:  November 21, 1940  DATE OF ADMISSION:  01/06/2011 DATE OF DISCHARGE:  LH                         DISCHARGE SUMMARY-REFERRING   ADDENDUM:  Her lab workup is following:  Her BUN is 9, creatinine is 0.9.  Sodium 140, potassium 4, chloride 109, CO2 is 23.  Urinalysis shows moderate amount of blood.  CBC shows WBC count is 10.2, hematocrit is 39.5.     Ky Barban, M.D.     MIJ/MEDQ  D:  01/06/2011  T:  01/06/2011  Job:  [id number]

## 2011-01-06 NOTE — Progress Notes (Signed)
  Subjective:    Patient ID: Theresa Garcia, female    DOB: [birth year], 70 y.o.   MRN: [id number]  HPI Seen in ed at [facility] on Sunday, dx with kidney stone, on oxycodone and cipro, still nauseated poor oral intake , weak , denies fever or chills. Poor apetite, has been in bed . Pt accompanied by her sisters who state they were told to f/i with PCP   Review of Systems See HPI Denies recent fever or chills. Denies sinus pressure, nasal congestion, ear pain or sore throat. Denies chest congestion, productive cough or wheezing. Denies chest pains, palpitations and leg swelling Denies joint pain, swelling and limitation in mobility. Denies headaches, seizures, numbness, or tingling. Denies depression, anxiety or insomnia. Denies skin break down or rash.        Objective:   Physical Exam Ill appearing female lying flat , in pain, in no C/p distres HEENT: No facial asymmetry, EOMI, no sinus tenderness,  oropharynx dry.  Neck supple no adenopathy.  Chest: Clear to auscultation bilaterally.  CVS: S1, S2 no murmurs, no S3.  ABD: Soft left renal angle tenderness. Bowel sounds normal. Ext: No edema  MS: Adequate ROM spine, shoulders, hips and knees.  Skin: Intact, no ulcerations or rash noted.  d appearing.  CNS: CN 2-12 intact, power, tone and sensation normal throughout.        Assessment & Plan:

## 2011-01-06 NOTE — H&P (Signed)
Hospital Admission Note Date: 01/06/2011  Patient name: Theresa Garcia Medical record number: [id number] Date of birth: 09/24/1940 Age: 70 y.o. Gender: female PCP: Syliva Overman, MD, MD  Attending physician: Christiane Ha  Chief Complaint: Vomiting and left-sided pain.  History of Present Illness:  Theresa Garcia is an 70 y.o. female who was directly admitted from Dr. Anthony Sar office with a kidney stone.  she started having left abdominal and flank pain about 6 days ago. It was accompanied by intractable vomiting. She was hesitant to go to the emergency room, and instead stayed at home with vomiting daily and pain for several days. She finally did present to the emergency room at [facility] on 01/04/2011. She was diagnosed with a ureteral stone and sent home with oxycodone and ciprofloxacin. Since then, she has had no improvement. She rates her pain at 10 out of 10. She's been unable to keep down medications. She has vomited multiple times daily and is dehydrated. She has become very weak and has been essentially bed bound for the past week. She's had no fevers or chills or sweats. She's had no hematemesis. No diarrhea. She's never had problems with nephrolithiasis in the past. Dr. Lodema Hong contacted Dr. Jesse Fall who recommended admission to the hospitalist service and he would consult.  Past Medical History  Diagnosis Date  . Insomnia   . Hyperlipidemia   . Hypertension   . Cancer   . Pneumonia   . GERD (gastroesophageal reflux disease)     Meds: Prescriptions prior to admission  Medication Sig Dispense Refill  . amLODipine-benazepril (LOTREL) 5-40 MG per capsule Take 1 capsule by mouth daily.  90 capsule  1  . atorvastatin (LIPITOR) 20 MG tablet Take 1 tab at bedtime  90 tablet  1  . benzonatate (TESSALON PERLES) 100 MG capsule Take 1 capsule (100 mg total) by mouth every 6 (six) hours as needed for cough.  30 capsule  0  . Cholecalciferol (VITAMIN D3) 50000  UNITS CAPS Take 50,000 Units by mouth once a week.  12 capsule  1  . diclofenac-misoprostol (ARTHROTEC 75) 75-0.2 MG per tablet Take 1 tablet by mouth 2 (two) times daily as needed.       . sucralfate (CARAFATE) 1 G tablet Take 1 tab 1/2 hour before each meal  270 tablet  1  . temazepam (RESTORIL) 30 MG capsule Take 1 capsule (30 mg total) by mouth at bedtime as needed.  90 capsule  1    Allergies: Penicillins History   Social History  . Marital Status: Divorced    Spouse Name: N/A    Number of Children: N/A  . Years of Education: N/A   Occupational History  . Not on file.   Social History Main Topics  . Smoking status: Never Smoker   . Smokeless tobacco: Not on file  . Alcohol Use: No  . Drug Use: Not on file  . Sexually Active: Not on file   Other Topics Concern  . Not on file   Social History Narrative  . No narrative on file   Family History  Problem Relation Age of Onset  . COPD Father   . COPD Sister   . COPD Sister   . Diabetes Sister   . Diabetes Brother   . Heart disease Brother    Past Surgical History  Procedure Date  . Abdominal hysterectomy   . Breast surgery     left  . Tubal ligation  Review of Systems: Systems reviewed and as per HPI, otherwise negative.  Physical Exam: Temperature is not recorded heart rate 74 respiratory rate 16 blood pressure 150/90   General Appearance:    weak appearing black female.  Head:    Normocephalic, without obvious abnormality, atraumatic  Eyes:    PERRL, conjunctiva/corneas clear, EOM's intact, fundi    benign, both eyes  Ears:    Normal TM's and external ear canals, both ears  Nose:   Nares normal, septum midline, mucosa normal, no drainage    or sinus tenderness  Throat:   Lips, mucosa, and tongue normal; teeth and gums normal  Neck:   Supple, symmetrical, trachea midline, no adenopathy;    thyroid:  no enlargement/tenderness/nodules; no carotid   bruit or JVD  Back:     Symmetric, no curvature, ROM  normal, left CVA tenderness  Lungs:     Clear to auscultation bilaterally, respirations unlabored  Chest Wall:    No tenderness or deformity   Heart:    Regular rate and rhythm, S1 and S2 normal, no murmur, rub   or gallop  Breast Exam:    No tenderness, masses, or nipple abnormality  Abdomen:     Soft, suprapubic and left abdominal tenderness, mild, bowel sounds active all four quadrants,    no masses, no organomegaly  Genitalia:   deferred   Rectal:   deferred   Extremities:   Extremities normal, atraumatic, no cyanosis or edema  Pulses:   2+ and symmetric all extremities  Skin:   Skin color, texture, poor skin turgor, no rashes or lesions  Lymph nodes:   Cervical, supraclavicular, and axillary nodes normal  Neurologic:   CNII-XII intact, normal strength, sensation and reflexes    throughout    Psychiatric: Normal affect. Pleasant and cooperative.  Lab results: Basic Metabolic Panel: All labs are pending. Labs from 01/04/2011 at outside hospital showed a sodium of 140 potassium of 4.0 chloride 108, carbon dioxide 23 BUN 9 creatinine 0.9. CBC at that time was normal. Urinalysis at that time showed specific gravity less than 1.005 negative protein negative ketones moderate blood negative nitrite negative bilirubin negative leukocyte esterase 0-5 white cells 0-5 red cells small bacteria small mucous negative glucose.  Imaging results:  CT scan of the abdomen and pelvis without contrast on 1111 showed mild to moderate left hydronephrosis with 5 mm obstructing calculus in the proximal/mid left ureter. Also incidentally found was a 3.6 cm cystic lesion which was incompletely characterized on the right. Status post hysterectomy.  Assessment & Plan: Principal Problem:  *Ureterolithiasis Active Problems:  Dehydration  HYPERTENSION  Nausea and vomiting  BREAST CANCER  HYPERLIPIDEMIA  INSOMNIA  Ovarian cyst  Patient will be admitted to the floor. IV fluids, pain medication nausea  medication. Repeat CBC and basic metabolic panel. Consult urology. Hold home medications for now and monitor blood pressure. Check a urinalysis. If signs of ongoing infection, resume IV antibiotics. She will need an elective pelvic ultrasound to further evaluate the cyst. She would be more comfortable to have it done once her stone passes (as an outpatient).  Jaiel Saraceno L 01/06/2011, 5:25 PM

## 2011-01-06 NOTE — Consult Note (Signed)
Report#[id number] 

## 2011-01-06 NOTE — Consult Note (Signed)
[phone]

## 2011-01-07 ENCOUNTER — Inpatient Hospital Stay (HOSPITAL_COMMUNITY): Payer: Medicare Other | Admitting: Anesthesiology

## 2011-01-07 ENCOUNTER — Encounter (HOSPITAL_COMMUNITY): Payer: Self-pay

## 2011-01-07 ENCOUNTER — Other Ambulatory Visit: Payer: Self-pay

## 2011-01-07 ENCOUNTER — Inpatient Hospital Stay (HOSPITAL_COMMUNITY): Payer: Medicare Other

## 2011-01-07 ENCOUNTER — Encounter (HOSPITAL_COMMUNITY): Payer: Self-pay | Admitting: Anesthesiology

## 2011-01-07 ENCOUNTER — Encounter (HOSPITAL_COMMUNITY)
Admission: AD | Disposition: A | Discharge status: Home / Self Care | Payer: Self-pay | Source: Ambulatory Visit | Attending: Internal Medicine

## 2011-01-07 HISTORY — PX: CYSTOSCOPY/RETROGRADE/URETEROSCOPY/STONE EXTRACTION WITH BASKET: SHX5317

## 2011-01-07 SURGERY — CYSTOSCOPY, WITH CALCULUS REMOVAL USING BASKET
Anesthesia: General | Site: Urethra | Laterality: Left | Wound class: Clean Contaminated

## 2011-01-07 MED ORDER — MIDAZOLAM HCL 5 MG/5ML IJ SOLN
INTRAMUSCULAR | Status: DC | PRN
  Administered 2011-01-07: 2 mg via INTRAVENOUS

## 2011-01-07 MED ORDER — ONDANSETRON HCL 4 MG/2ML IJ SOLN: 4.0000 mg | Freq: Once | INTRAMUSCULAR | Status: DC | PRN

## 2011-01-07 MED ORDER — ONDANSETRON HCL 4 MG/2ML IJ SOLN
INTRAMUSCULAR | Status: AC
  Filled 2011-01-07: qty 2

## 2011-01-07 MED ORDER — MIDAZOLAM HCL 2 MG/2ML IJ SOLN
INTRAMUSCULAR | Status: AC
  Filled 2011-01-07: qty 2

## 2011-01-07 MED ORDER — STERILE WATER FOR IRRIGATION IR SOLN
Status: DC | PRN
  Administered 2011-01-07: 1000 mL

## 2011-01-07 MED ORDER — FENTANYL CITRATE 0.05 MG/ML IJ SOLN: 25.0000 ug | INTRAMUSCULAR | Status: DC | PRN

## 2011-01-07 MED ORDER — PROPOFOL 10 MG/ML IV EMUL
INTRAVENOUS | Status: AC
  Filled 2011-01-07: qty 20

## 2011-01-07 MED ORDER — MORPHINE SULFATE 2 MG/ML IJ SOLN: 1.0000 mg | INTRAMUSCULAR | Status: DC | PRN

## 2011-01-07 MED ORDER — MIDAZOLAM HCL 2 MG/2ML IJ SOLN
1.0000 mg | INTRAMUSCULAR | Status: DC | PRN
  Administered 2011-01-07: 2 mg via INTRAVENOUS

## 2011-01-07 MED ORDER — PROPOFOL 10 MG/ML IV EMUL
INTRAVENOUS | Status: DC | PRN
  Administered 2011-01-07: 150 mg via INTRAVENOUS

## 2011-01-07 MED ORDER — LACTATED RINGERS IV SOLN
INTRAVENOUS | Status: DC
  Administered 2011-01-07: 1000 mL via INTRAVENOUS

## 2011-01-07 MED ORDER — LIDOCAINE HCL 1 % IJ SOLN
INTRAMUSCULAR | Status: DC | PRN
  Administered 2011-01-07: 25 mg via INTRADERMAL

## 2011-01-07 MED ORDER — ONDANSETRON HCL 4 MG PO TABS: 4.0000 mg | ORAL_TABLET | Freq: Four times a day (QID) | ORAL | Status: DC | PRN

## 2011-01-07 MED ORDER — SODIUM CHLORIDE 0.9 % IR SOLN
Status: DC | PRN
  Administered 2011-01-07: 3000 mL

## 2011-01-07 MED ORDER — FENTANYL CITRATE 0.05 MG/ML IJ SOLN
INTRAMUSCULAR | Status: DC | PRN
  Administered 2011-01-07: 25 ug via INTRAVENOUS
  Administered 2011-01-07: 50 ug via INTRAVENOUS
  Administered 2011-01-07: 25 ug via INTRAVENOUS

## 2011-01-07 MED ORDER — LIDOCAINE HCL (PF) 1 % IJ SOLN
INTRAMUSCULAR | Status: AC
  Filled 2011-01-07: qty 5

## 2011-01-07 MED ORDER — ONDANSETRON HCL 4 MG/2ML IJ SOLN
4.0000 mg | Freq: Once | INTRAMUSCULAR | Status: AC
  Administered 2011-01-07: 4 mg via INTRAVENOUS

## 2011-01-07 MED ORDER — LACTATED RINGERS IV SOLN
INTRAVENOUS | Status: DC | PRN
  Administered 2011-01-07 (×2): via INTRAVENOUS

## 2011-01-07 MED ORDER — DEXTROSE-NACL 5-0.45 % IV SOLN
INTRAVENOUS | Status: DC
  Administered 2011-01-07: 950 mL via INTRAVENOUS
  Administered 2011-01-08: 04:00:00 via INTRAVENOUS

## 2011-01-07 MED ORDER — ENALAPRILAT 1.25 MG/ML IV SOLN: 0.6250 mg | Freq: Four times a day (QID) | INTRAVENOUS | Status: DC | PRN

## 2011-01-07 MED ORDER — FENTANYL CITRATE 0.05 MG/ML IJ SOLN
INTRAMUSCULAR | Status: AC
  Filled 2011-01-07: qty 2

## 2011-01-07 MED ORDER — ONDANSETRON HCL 4 MG/2ML IJ SOLN: 4.0000 mg | Freq: Four times a day (QID) | INTRAMUSCULAR | Status: DC | PRN

## 2011-01-07 MED ORDER — IOHEXOL 350 MG/ML SOLN
INTRAVENOUS | Status: DC | PRN
  Administered 2011-01-07 (×2): 50 mL via INTRAVENOUS

## 2011-01-07 MED ORDER — CIPROFLOXACIN IN D5W 200 MG/100ML IV SOLN
200.0000 mg | INTRAVENOUS | Status: AC
  Administered 2011-01-07: 200 mg via INTRAVENOUS
  Filled 2011-01-07: qty 100

## 2011-01-07 MED ORDER — TEMAZEPAM 15 MG PO CAPS
30.0000 mg | ORAL_CAPSULE | Freq: Every day | ORAL | Status: DC
Start: 2011-01-07 — End: 2011-01-08
  Administered 2011-01-07: 30 mg via ORAL
  Filled 2011-01-07: qty 2

## 2011-01-07 SURGICAL SUPPLY — 25 items
BAG DRAIN URO TABLE W/ADPT NS (DRAPE) ×3 IMPLANT
BASKET LASER NITINOL 1.9FR (BASKET) ×3 IMPLANT
BASKET/GRASPER STONE 3FR 13CM (MISCELLANEOUS) ×3 IMPLANT
CATH 5 FR WEDGE TIP (UROLOGICAL SUPPLIES) ×3 IMPLANT
CATH OPEN TIP 5FR (CATHETERS) ×3 IMPLANT
CLOTH BEACON ORANGE TIMEOUT ST (SAFETY) ×3 IMPLANT
DILATOR UROMAX ULTRA (MISCELLANEOUS) ×3 IMPLANT
FLOOR PAD 36X40 (MISCELLANEOUS)
GLOVE BIO SURGEON STRL SZ7 (GLOVE) ×3 IMPLANT
GLOVE BIOGEL PI IND STRL 7.0 (GLOVE) ×2 IMPLANT
GLOVE BIOGEL PI INDICATOR 7.0 (GLOVE) ×1
GLOVE EXAM NITRILE MD LF STRL (GLOVE) ×3 IMPLANT
GLOVE OPTIFIT SS 6.5 STRL BRWN (GLOVE) ×3 IMPLANT
GOWN STRL REIN XL XLG (GOWN DISPOSABLE) ×3 IMPLANT
IV NS IRRIG 3000ML ARTHROMATIC (IV SOLUTION) ×3 IMPLANT
KIT ROOM TURNOVER AP CYSTO (KITS) ×3 IMPLANT
LASER FIBER DISP (UROLOGICAL SUPPLIES) ×3 IMPLANT
MANIFOLD NEPTUNE II (INSTRUMENTS) ×3 IMPLANT
PACK CYSTO (CUSTOM PROCEDURE TRAY) ×3 IMPLANT
PAD ARMBOARD 7.5X6 YLW CONV (MISCELLANEOUS) ×3 IMPLANT
PAD FLOOR 36X40 (MISCELLANEOUS) IMPLANT
STENT PERCUFLEX 4.8FRX24 (STENTS) ×3 IMPLANT
STONE RETRIEVAL GEMINI 2.4 FR (MISCELLANEOUS) ×3 IMPLANT
TOWEL OR 17X26 4PK STRL BLUE (TOWEL DISPOSABLE) ×3 IMPLANT
WIRE GUIDE BENTSON .035 15CM (WIRE) IMPLANT

## 2011-01-07 NOTE — Progress Notes (Signed)
Pt reexamined H&P reviewed and no change.

## 2011-01-07 NOTE — Progress Notes (Signed)
OR nurse called stating to go ahead and give Cipro this morning.

## 2011-01-07 NOTE — Progress Notes (Signed)
Subjective: Couldn't sleep last night. No pain currently. Nausea and vomiting continues.  Objective: Vital signs in last 24 hours: Filed Vitals:   01/06/11 1735 01/06/11 2244 01/07/11 0600  BP: 173/83 143/77 152/77  Pulse: 72 71 68  Temp: 98.6 F (37 C) 98.1 F (36.7 C) 98.1 F (36.7 C)  TempSrc: Oral Oral Oral  Resp: 20 20 16   Height: 5\' 7"  (1.702 m)    Weight: 78.3 kg (172 lb 9.9 oz)    SpO2: 92% 98% 96%   Weight change:   Intake/Output Summary (Last 24 hours) at 01/07/11 1039 Last data filed at 01/07/11 0600  Gross per 24 hour  Intake   2340 ml  Output   2200 ml  Net    140 ml   Physical Exam:  Gen.: Groggy. Unchanged from 01/06/2011.  Lab Results: Basic Metabolic Panel:  Lab 01/06/11 7829  NA 143  K 3.7  CL 108  CO2 25  GLUCOSE 99  BUN 4*  CREATININE 0.78  CALCIUM 10.2  MG --  PHOS --   Liver Function Tests:  Lab 01/06/11 1736  AST 14  ALT 11  ALKPHOS 62  BILITOT 0.2*  PROT 7.6  ALBUMIN 3.7   No results found for this basename: LIPASE:2,AMYLASE:2 in the last 168 hours No results found for this basename: AMMONIA:2 in the last 168 hours CBC:  Lab 01/06/11 1736  WBC 7.4  NEUTROABS 4.5  HGB 12.8  HCT 39.3  MCV 93.1  PLT 290   Cardiac Enzymes: No results found for this basename: CKTOTAL:3,CKMB:3,CKMBINDEX:3,TROPONINI:3 in the last 168 hours  Alcohol Level: No results found for this basename: ETH:2 in the last 168 hours Urinalysis: Negative for blood, leukocyte esterase, protein, nitrites  Scheduled Meds:   . ciprofloxacin  200 mg Intravenous 120 min pre-op  . sodium chloride  500 mL Intravenous Once  . sodium chloride      . DISCONTD: ciprofloxacin  200 mg Intravenous 120 min pre-op   Continuous Infusions:   . sodium chloride 150 mL/hr at 01/07/11 0452   PRN Meds:.acetaminophen, acetaminophen, alum & mag hydroxide-simeth, HYDROmorphone, ondansetron (ZOFRAN) IV, ondansetron, senna  Assessment/Plan: Principal Problem:  *Ureterolithiasis Active Problems:  Dehydration  HYPERTENSION  Nausea and vomiting  BREAST CANCER  HYPERLIPIDEMIA  INSOMNIA  Ovarian cyst  Patient was not given sleep last night despite my order to give it when necessary. I will schedule it nightly. Await cystoscopy and stone removal.  Appreciate Dr. Rudean Haskell assistance.   LOS: 1 day   Jadee Golebiewski L 01/07/2011, 10:39 AM

## 2011-01-07 NOTE — Anesthesia Postprocedure Evaluation (Signed)
  Anesthesia Post-op Note  Patient: Theresa Garcia  Procedure(s) Performed:  CYSTOSCOPY/RETROGRADE/URETEROSCOPY/STONE EXTRACTION WITH BASKET - Balloon Dilatation ; HOLMIUM LASER APPLICATION  Patient Location: PACU  Anesthesia Type: General  Level of Consciousness: awake, alert , oriented and patient cooperative  Airway and Oxygen Therapy: Patient Spontanous Breathing  Post-op Pain: 2 /10, mild  Post-op Assessment: Post-op Vital signs reviewed, Patient's Cardiovascular Status Stable, Respiratory Function Stable, Patent Airway and No signs of Nausea or vomiting  Post-op Vital Signs: Reviewed and stable  Complications: No apparent anesthesia complications

## 2011-01-07 NOTE — Anesthesia Preprocedure Evaluation (Addendum)
Anesthesia Evaluation  Patient identified by MRN, date of birth, ID band Patient awake    Reviewed: Allergy & Precautions, H&P , NPO status , Patient's Chart, lab work & pertinent test results, reviewed documented beta blocker date and time   History of Anesthesia Complications Negative for: history of anesthetic complications  Airway Mallampati: I      Dental  (+) Edentulous Upper   Pulmonary pneumonia  (resolved 1 month ago, no cough),  clear to auscultation        Cardiovascular hypertension, Pt. on medications Regular Normal    Neuro/Psych    GI/Hepatic   Endo/Other    Renal/GU      Musculoskeletal   Abdominal   Peds  Hematology   Anesthesia Other Findings   Reproductive/Obstetrics                           Anesthesia Physical Anesthesia Plan  ASA: II  Anesthesia Plan: General   Post-op Pain Management:    Induction: Intravenous  Airway Management Planned: LMA  Additional Equipment:   Intra-op Plan:   Post-operative Plan: Extubation in OR  Informed Consent: I have reviewed the patients History and Physical, chart, labs and discussed the procedure including the risks, benefits and alternatives for the proposed anesthesia with the patient or authorized representative who has indicated his/her understanding and acceptance.     Plan Discussed with:   Anesthesia Plan Comments:         Anesthesia Quick Evaluation

## 2011-01-07 NOTE — Anesthesia Procedure Notes (Signed)
Procedure Name: LMA Insertion Date/Time: 01/07/2011 1:57 PM Performed by: Despina Hidden Pre-anesthesia Checklist: Patient identified, Patient being monitored, Emergency Drugs available and Suction available Patient Re-evaluated:Patient Re-evaluated prior to inductionOxygen Delivery Method: Circle System Utilized Preoxygenation: Pre-oxygenation with 100% oxygen Intubation Type: IV induction Ventilation: Mask ventilation without difficulty LMA Size: 3.0 Number of attempts: 1 Placement Confirmation: breath sounds checked- equal and bilateral and positive ETCO2 Tube secured with: Tape Dental Injury: Teeth and Oropharynx as per pre-operative assessment

## 2011-01-07 NOTE — Op Note (Signed)
[phone]

## 2011-01-07 NOTE — Brief Op Note (Signed)
01/06/2011 - 01/07/2011  3:35 PM  PATIENT:  Theresa Garcia  70 y.o. female  PRE-OPERATIVE DIAGNOSIS:  left ureteral stone  POST-OPERATIVE DIAGNOSIS:  left ureteral stone  PROCEDURE:  Procedure(s): CYSTOSCOPY/RETROGRADE/URETEROSCOPY/STONE EXTRACTION WITH BASKET HOLMIUM LASER APPLICATION  SURGEON:  Surgeon(s): Ky Barban  PHYSICIAN ASSISTANT:   ASSISTANTS: none   ANESTHESIA:   general  EBL:  Total I/O In: 1000 [I.V.:1000] Out: 1350 [Urine:1350]  BLOOD ADMINISTERED:none  DRAINS: double j stent   LOCAL MEDICATIONS USED:  NONE  SPECIMEN:  Source of Specimen:  ureteral stone   DISPOSITION OF SPECIMEN:  given to the patient  COUNTS:  YES  TOURNIQUET:  * No tourniquets in log *  DICTATION: .Other Dictation: Dictation Number [id number]  PLAN OF CARE: Admit to inpatient   PATIENT DISPOSITION:  PACU - hemodynamically stable.   Delay start of Pharmacological VTE agent (>24hrs) due to surgical blood loss or risk of bleeding:  {YES/NO/NOT APPLICABLE:20182

## 2011-01-07 NOTE — Transfer of Care (Signed)
Immediate Anesthesia Transfer of Care Note  Patient: Theresa Garcia  Procedure(s) Performed:  CYSTOSCOPY/RETROGRADE/URETEROSCOPY/STONE EXTRACTION WITH BASKET - Balloon Dilatation ; HOLMIUM LASER APPLICATION  Patient Location: PACU  Anesthesia Type: General  Level of Consciousness: awake, alert , oriented and patient cooperative  Airway & Oxygen Therapy: Patient Spontanous Breathing and Patient connected to face mask oxygen  Post-op Assessment: Report given to PACU RN, Post -op Vital signs reviewed and stable and Patient moving all extremities  Post vital signs: Reviewed and stable  Complications: No apparent anesthesia complications

## 2011-01-07 NOTE — Progress Notes (Signed)
Writer called OR to find out about what time procedure will be done so I could give Cipro 120 minutes before.  OR nurse stated that it will probably be after lunch but not sure on exact time.  OR nurse to call to let me know of definite time.

## 2011-01-07 NOTE — Op Note (Signed)
NAME:  Theresa Garcia, Theresa Garcia              ACCOUNT NO.:  [id number]  MEDICAL RECORD NO.:  [id number]  LOCATION:  A340                          FACILITY:  [facility]  PHYSICIAN:  Ky Barban, M.D.DATE OF BIRTH:  11-18-1940  DATE OF PROCEDURE: DATE OF DISCHARGE:                              OPERATIVE REPORT   PREOPERATIVE DIAGNOSIS:  Left ureteral calculus.  POSTOPERATIVE DIAGNOSIS:  Left ureteral calculus.  PROCEDURE:  Cystoscopy, left retrograde pyelogram, ureteroscopic stone basket extraction, holmium laser lithotripsy, insertion of double-J stent size 5-French 24 cm.  No strings attached.  ANESTHESIA:  General.  DESCRIPTION OF PROCEDURE:  The patient under general endotracheal anesthesia in lithotomy position.  After usual prep and drape, #25 cystoscope introduced into the bladder.  It is inspected, bladder looks normal.  Left ureteral orifice catheterized with a wedge catheter. Hypaque injected under fluoroscopic control.  It goes up into the upper ureter and I can see the obstruction and a filling defect but the dye goes above that without much difficulty and that is the area of the stone which is almost between L2 and L3.  Now a guidewire is passed up into the renal pelvis and over the guidewire.  The balloon dilator #15 is introduced into the intramural ureter, which was dilated.  After dilating the intramural ureter, ureteroscope short rigid was introduced into the ureter and I went alongside the guidewire to the level of the stone.  Stone is visualized and with the help of 316 laser fiber, the stone was broken into pieces and engaged into basket and several pieces of stones were removed gradually without much difficulty.  Once all the stones were removed, the ureter was inspected, looked satisfactory, and I went all the way near the UPJ.  There are no more stones and the ureter was intact.  Ureteroscope was removed over the guidewire.  A 5- French 24 cm double-J stent was  inserted under fluoroscopic control.  It was positioned between the renal pelvis and the bladder.  Nice loop was obtained in the bladder, kidney, and pelvis.  All the instruments were removed.  The patient left the operating room in satisfactory condition.     Ky Barban, M.D.     MIJ/MEDQ  D:  01/07/2011  T:  01/07/2011  Job:  [id number]

## 2011-01-08 LAB — CBC
HCT: 38.1 % (ref 36.0–46.0)
Hemoglobin: 12.7 g/dL (ref 12.0–15.0)
MCH: 31.2 pg (ref 26.0–34.0)
MCHC: 33.3 g/dL (ref 30.0–36.0)
MCV: 93.6 fL (ref 78.0–100.0)
Platelets: 283 10*3/uL (ref 150–400)
RBC: 4.07 MIL/uL (ref 3.87–5.11)
RDW: 13.1 % (ref 11.5–15.5)
WBC: 9.9 10*3/uL (ref 4.0–10.5)

## 2011-01-08 LAB — BASIC METABOLIC PANEL
BUN: 5 mg/dL — ABNORMAL LOW (ref 6–23)
CO2: 26 mEq/L (ref 19–32)
Calcium: 9.3 mg/dL (ref 8.4–10.5)
Chloride: 105 mEq/L (ref 96–112)
Creatinine, Ser: 0.9 mg/dL (ref 0.50–1.10)
GFR calc Af Amer: 75 mL/min — ABNORMAL LOW (ref >90)
GFR calc non Af Amer: 65 mL/min — ABNORMAL LOW (ref >90)
Glucose, Bld: 112 mg/dL — ABNORMAL HIGH (ref 70–99)
Potassium: 3.2 mEq/L — ABNORMAL LOW (ref 3.5–5.1)
Sodium: 139 mEq/L (ref 135–145)

## 2011-01-08 MED ORDER — PROMETHAZINE HCL 12.5 MG PO TABS: 12.5000 mg | ORAL_TABLET | Freq: Four times a day (QID) | ORAL | Status: AC | PRN

## 2011-01-08 MED ORDER — ACETAMINOPHEN 325 MG PO TABS: 650.0000 mg | ORAL_TABLET | Freq: Four times a day (QID) | ORAL | Status: AC | PRN

## 2011-01-08 MED ORDER — OXYCODONE-ACETAMINOPHEN 5-325 MG PO TABS: 1.0000 | ORAL_TABLET | ORAL | Status: AC | PRN

## 2011-01-08 NOTE — Progress Notes (Signed)
Afebrile.feels fine . Gave her instructions may be discharged on percocet. Will see her in office in one week. i have told her that she has a stent i will remove it in office.

## 2011-01-08 NOTE — Addendum Note (Signed)
Addendum  created 01/08/11 1329 by Despina Hidden   Modules edited:Notes Section

## 2011-01-08 NOTE — Anesthesia Postprocedure Evaluation (Signed)
  Anesthesia Post-op Note  Patient: Theresa Garcia  Procedure(s) Performed:  CYSTOSCOPY/RETROGRADE/URETEROSCOPY/STONE EXTRACTION WITH BASKET - Balloon Dilatation; stone given to family per MD; HOLMIUM LASER APPLICATION  Patient Location: room  340  Anesthesia Type: General  Level of Consciousness: awake and patient cooperative  Airway and Oxygen Therapy: Patient Spontanous Breathing  Post-op Pain: none  Post-op Assessment: Post-op Vital signs reviewed, Patient's Cardiovascular Status Stable, Respiratory Function Stable, Patent Airway and No signs of Nausea or vomiting  Post-op Vital Signs: Reviewed and stable  Complications: No apparent anesthesia complications

## 2011-01-08 NOTE — Discharge Summary (Signed)
Physician Discharge Summary  Patient ID: DYNASTI KERMAN MRN: [id number] DOB/AGE: Feb 16, 1941 70 y.o.  Admit date: 01/06/2011 Discharge date: 01/08/2011  Discharge Diagnoses:  Principal Problem:  *Ureterolithiasis Active Problems:  Dehydration  HYPERTENSION  Nausea and vomiting  BREAST CANCER  HYPERLIPIDEMIA  INSOMNIA  Ovarian cyst, needs outpatient pelvic ultrasound after stent is out and symptoms resolved.   Current Discharge Medication List    START taking these medications   Details  acetaminophen (TYLENOL) 325 MG tablet Take 2 tablets (650 mg total) by mouth every 6 (six) hours as needed (or Fever >/= 101). Qty: 30 tablet    oxyCODONE-acetaminophen (ROXICET) 5-325 MG per tablet Take 1 tablet by mouth every 4 (four) hours as needed for pain. Qty: 15 tablet, Refills: 0    promethazine (PHENERGAN) 12.5 MG tablet Take 1 tablet (12.5 mg total) by mouth every 6 (six) hours as needed for nausea. Qty: 15 tablet, Refills: 0      CONTINUE these medications which have NOT CHANGED   Details  amLODipine-benazepril (LOTREL) 5-40 MG per capsule Take 1 capsule by mouth daily. Qty: 90 capsule, Refills: 1    atorvastatin (LIPITOR) 20 MG tablet Take 1 tab at bedtime Qty: 90 tablet, Refills: 1    benzonatate (TESSALON PERLES) 100 MG capsule Take 1 capsule (100 mg total) by mouth every 6 (six) hours as needed for cough. Qty: 30 capsule, Refills: 0   Associated Diagnoses: Bronchitis    Cholecalciferol (VITAMIN D3) 50000 UNITS CAPS Take 50,000 Units by mouth once a week. Qty: 12 capsule, Refills: 1    diclofenac-misoprostol (ARTHROTEC 75) 75-0.2 MG per tablet Take 1 tablet by mouth 2 (two) times daily as needed.     sucralfate (CARAFATE) 1 G tablet Take 1 tab 1/2 hour before each meal Qty: 270 tablet, Refills: 1    temazepam (RESTORIL) 30 MG capsule Take 1 capsule (30 mg total) by mouth at bedtime as needed. Qty: 90 capsule, Refills: 1        Discharge Orders    Future  Appointments: Provider: Department: Dept Phone: Center:   04/03/2011 11:00 AM Milinda Antis, MD [facility] [phone] [facility]     Future Orders Please Complete By Expires   Diet - low sodium heart healthy      Activity as tolerated - No restrictions      Call MD for:  temperature >100.4      Call MD for:  persistant nausea and vomiting      Call MD for:  severe uncontrolled pain         Follow-up Information    Follow up with JAVAID,MOHAMMAD I. Make an appointment in 1 week.   Contact information:   [address] [place] [address] [phone]       Follow up with Syliva Overman, MD in 1 month. (arrange pelvic ultrasound for ovarian cyst)    Contact information:   [address], [address] [place] [address] [phone]          Disposition: Home  Discharged Condition: Stable  Consults: Treatment Team:  Ky Barban  Labs:   Results for orders placed during the hospital encounter of 01/06/11 (from the past 48 hour(s))  CBC     Status: Normal   Collection Time   01/06/11  5:36 PM      Component Value Range Comment   WBC 7.4  4.0 - 10.5 (K/uL)    RBC 4.22  3.87 - 5.11 (MIL/uL)  Hemoglobin 12.8  12.0 - 15.0 (g/dL)    HCT 16.1  09.6 - 04.5 (%)    MCV 93.1  78.0 - 100.0 (fL)    MCH 30.3  26.0 - 34.0 (pg)    MCHC 32.6  30.0 - 36.0 (g/dL)    RDW 40.9  81.1 - 91.4 (%)    Platelets 290  150 - 400 (K/uL)   DIFFERENTIAL     Status: Normal   Collection Time   01/06/11  5:36 PM      Component Value Range Comment   Neutrophils Relative 61  43 - 77 (%)    Neutro Abs 4.5  1.7 - 7.7 (K/uL)    Lymphocytes Relative 32  12 - 46 (%)    Lymphs Abs 2.4  0.7 - 4.0 (K/uL)    Monocytes Relative 6  3 - 12 (%)    Monocytes Absolute 0.4  0.1 - 1.0 (K/uL)    Eosinophils Relative 1  0 - 5 (%)    Eosinophils Absolute 0.1  0.0 - 0.7 (K/uL)    Basophils Relative 0  0 - 1 (%)    Basophils Absolute 0.0  0.0 - 0.1 (K/uL)     COMPREHENSIVE METABOLIC PANEL     Status: Abnormal   Collection Time   01/06/11  5:36 PM      Component Value Range Comment   Sodium 143  135 - 145 (mEq/L)    Potassium 3.7  3.5 - 5.1 (mEq/L)    Chloride 108  96 - 112 (mEq/L)    CO2 25  19 - 32 (mEq/L)    Glucose, Bld 99  70 - 99 (mg/dL)    BUN 4 (*) 6 - 23 (mg/dL)    Creatinine, Ser 7.82  0.50 - 1.10 (mg/dL)    Calcium 95.6  8.4 - 10.5 (mg/dL)    Total Protein 7.6  6.0 - 8.3 (g/dL)    Albumin 3.7  3.5 - 5.2 (g/dL)    AST 14  0 - 37 (U/L)    ALT 11  0 - 35 (U/L)    Alkaline Phosphatase 62  39 - 117 (U/L)    Total Bilirubin 0.2 (*) 0.3 - 1.2 (mg/dL)    GFR calc non Af Amer 85 (*) >90 (mL/min)    GFR calc Af Amer >90  >90 (mL/min)   URINALYSIS, ROUTINE W REFLEX MICROSCOPIC     Status: Normal   Collection Time   01/06/11  8:05 PM      Component Value Range Comment   Color, Urine YELLOW  YELLOW     Appearance CLEAR  CLEAR     Specific Gravity, Urine 1.015  1.005 - 1.030     pH 6.0  5.0 - 8.0     Glucose, UA NEGATIVE  NEGATIVE (mg/dL)    Hgb urine dipstick NEGATIVE  NEGATIVE     Bilirubin Urine NEGATIVE  NEGATIVE     Ketones, ur NEGATIVE  NEGATIVE (mg/dL)    Protein, ur NEGATIVE  NEGATIVE (mg/dL)    Urobilinogen, UA 0.2  0.0 - 1.0 (mg/dL)    Nitrite NEGATIVE  NEGATIVE     Leukocytes, UA NEGATIVE  NEGATIVE  MICROSCOPIC NOT DONE ON URINES WITH NEGATIVE PROTEIN, BLOOD, LEUKOCYTES, NITRITE, OR GLUCOSE <1000 mg/dL.  BASIC METABOLIC PANEL     Status: Abnormal   Collection Time   01/08/11  4:36 AM      Component Value Range Comment   Sodium 139  135 - 145 (mEq/L)  Potassium 3.2 (*) 3.5 - 5.1 (mEq/L)    Chloride 105  96 - 112 (mEq/L)    CO2 26  19 - 32 (mEq/L)    Glucose, Bld 112 (*) 70 - 99 (mg/dL)    BUN 5 (*) 6 - 23 (mg/dL)    Creatinine, Ser 1.61  0.50 - 1.10 (mg/dL)    Calcium 9.3  8.4 - 10.5 (mg/dL)    GFR calc non Af Amer 65 (*) >90 (mL/min)    GFR calc Af Amer 75 (*) >90 (mL/min)   CBC     Status: Normal   Collection  Time   01/08/11  4:36 AM      Component Value Range Comment   WBC 9.9  4.0 - 10.5 (K/uL)    RBC 4.07  3.87 - 5.11 (MIL/uL)    Hemoglobin 12.7  12.0 - 15.0 (g/dL)    HCT 09.6  04.5 - 40.9 (%)    MCV 93.6  78.0 - 100.0 (fL)    MCH 31.2  26.0 - 34.0 (pg)    MCHC 33.3  30.0 - 36.0 (g/dL)    RDW 81.1  91.4 - 78.2 (%)    Platelets 283  150 - 400 (K/uL)     Diagnostics:  Dg Retrograde Pyelogram  01/07/2011  *RADIOLOGY REPORT*  Clinical Data: Left ureteral calculus.  RETROGRADE PYELOGRAM  Comparison: CT scan 01/04/2011.  Findings: Fluoroscopic spot images demonstrate cannulation of the left ureter.  Injection of contrast material demonstrates a dilated left renal collecting system.  Subsequent balloon passage, stone extraction and double J ureteral stent placement.  IMPRESSION: Left ureteral stone extraction and double J stent placement.  Original Report Authenticated By: P. Loralie Champagne, M.D.    Procedures: Cystoscopy, left retrograde pyelogram, ureteroscopic stone basket extraction, laser lithotripsy, insertion of double-J stent by Dr. Jerre Simon on 11/14/2 2012  Full Code   Hospital Course:   See H&P for complete admission details. Ms. Kilts is a pleasant 70 year old black female who was directly admitted for treatment of ureterolithiasis and left hydronephrosis. Patient had been diagnosed with a 5 mm obstructing calculus in the left ureter on 01/04/2011 at [facility]. She was sent home with Cipro and pain medication. She continued to have intractable vomiting and pain. She was admitted to the hospitalist service and urology was consulted. Repeat urinalysis was negative and her laboratories were unremarkable. She did appear dehydrated on physical examination and had left lower cord or tenderness. She under went stone extraction and double-J stent placement without incident. Her vomiting and pain have resolved. She's been cleared for discharge and will followup next week with Dr.  Jesse Fall to remove the stent.  Incidentally noted on outside hospitals scan was a 3.6 cm cystic lesion of the right ovary which was incompletely characterized. She will need elective pelvic ultrasound and I've instructed patient about this followup needed and wrote it on her discharge instructions. I will also e-mail her primary care physician, Dr. Lodema Hong. Total time on the day of discharge is greater than 30 minutes.  Discharge Exam:  Blood pressure 145/74, pulse 80, temperature 97.9 F (36.6 C), temperature source Oral, resp. rate 20, height 5\' 7"  (1.702 m), weight 78.3 kg (172 lb 9.9 oz), SpO2 93.00%.  Unchanged from 01/07/2011   Signed: Crista Curb L 01/08/2011, 9:55 AM

## 2011-01-09 ENCOUNTER — Other Ambulatory Visit: Payer: Self-pay | Admitting: Family Medicine

## 2011-01-09 DIAGNOSIS — N83209 Unspecified ovarian cyst, unspecified side: Secondary | ICD-10-CM

## 2011-01-11 NOTE — Assessment & Plan Note (Signed)
Clinically dehydrated by history and on exam , needs admission

## 2011-01-11 NOTE — Assessment & Plan Note (Signed)
Elevated blood pressure, pt has not been able to keep down food

## 2011-01-11 NOTE — Assessment & Plan Note (Signed)
Symptomatic, with dehydration, reduced oral intake, nausea and vomitng, will directly admit to medical floor and direct contact was made both with hospitalist and urologist

## 2011-01-12 ENCOUNTER — Other Ambulatory Visit: Payer: Self-pay | Admitting: Family Medicine

## 2011-01-12 DIAGNOSIS — N83209 Unspecified ovarian cyst, unspecified side: Secondary | ICD-10-CM

## 2011-01-13 ENCOUNTER — Encounter (HOSPITAL_COMMUNITY): Payer: Self-pay | Admitting: Urology

## 2011-01-14 ENCOUNTER — Other Ambulatory Visit (HOSPITAL_COMMUNITY): Payer: Medicare Other

## 2011-01-29 ENCOUNTER — Telehealth: Payer: Self-pay | Admitting: Family Medicine

## 2011-01-29 NOTE — Telephone Encounter (Signed)
noted 

## 2011-01-30 ENCOUNTER — Encounter: Payer: Self-pay | Admitting: Family Medicine

## 2011-02-02 ENCOUNTER — Encounter: Payer: Self-pay | Admitting: Family Medicine

## 2011-02-02 ENCOUNTER — Ambulatory Visit (INDEPENDENT_AMBULATORY_CARE_PROVIDER_SITE_OTHER): Payer: Medicare Other | Admitting: Family Medicine

## 2011-02-02 ENCOUNTER — Ambulatory Visit (HOSPITAL_COMMUNITY)
Admission: RE | Admit: 2011-02-02 | Discharge: 2011-02-02 | Disposition: A | Discharge status: Home / Self Care | Payer: Medicare Other | Source: Ambulatory Visit | Attending: Family Medicine | Admitting: Family Medicine

## 2011-02-02 VITALS — BP 122/80 | HR 74 | Resp 16 | Ht 67.0 in | Wt 172.0 lb

## 2011-02-02 DIAGNOSIS — F329 Major depressive disorder, single episode, unspecified: Secondary | ICD-10-CM

## 2011-02-02 DIAGNOSIS — M545 Low back pain, unspecified: Secondary | ICD-10-CM | POA: Insufficient documentation

## 2011-02-02 DIAGNOSIS — R109 Unspecified abdominal pain: Secondary | ICD-10-CM

## 2011-02-02 DIAGNOSIS — E860 Dehydration: Secondary | ICD-10-CM

## 2011-02-02 DIAGNOSIS — M899 Disorder of bone, unspecified: Secondary | ICD-10-CM | POA: Insufficient documentation

## 2011-02-02 DIAGNOSIS — R5383 Other fatigue: Secondary | ICD-10-CM

## 2011-02-02 DIAGNOSIS — M949 Disorder of cartilage, unspecified: Secondary | ICD-10-CM | POA: Insufficient documentation

## 2011-02-02 DIAGNOSIS — M549 Dorsalgia, unspecified: Secondary | ICD-10-CM

## 2011-02-02 DIAGNOSIS — N3000 Acute cystitis without hematuria: Secondary | ICD-10-CM

## 2011-02-02 DIAGNOSIS — F3289 Other specified depressive episodes: Secondary | ICD-10-CM | POA: Insufficient documentation

## 2011-02-02 DIAGNOSIS — N390 Urinary tract infection, site not specified: Secondary | ICD-10-CM

## 2011-02-02 DIAGNOSIS — F32A Depression, unspecified: Secondary | ICD-10-CM

## 2011-02-02 DIAGNOSIS — M546 Pain in thoracic spine: Secondary | ICD-10-CM | POA: Insufficient documentation

## 2011-02-02 DIAGNOSIS — I10 Essential (primary) hypertension: Secondary | ICD-10-CM

## 2011-02-02 DIAGNOSIS — E785 Hyperlipidemia, unspecified: Secondary | ICD-10-CM

## 2011-02-02 DIAGNOSIS — R5381 Other malaise: Secondary | ICD-10-CM

## 2011-02-02 LAB — POCT URINALYSIS DIPSTICK
Bilirubin, UA: NEGATIVE
Glucose, UA: NEGATIVE
Leukocytes, UA: NEGATIVE
Nitrite, UA: NEGATIVE
Protein, UA: NEGATIVE
Spec Grav, UA: >=1.03
Urobilinogen, UA: 0.2
pH, UA: 6

## 2011-02-02 MED ORDER — TRAMADOL HCL 50 MG PO TABS: ORAL_TABLET | ORAL | Status: DC

## 2011-02-02 MED ORDER — FLUOXETINE HCL 10 MG PO CAPS: 10.0000 mg | ORAL_CAPSULE | Freq: Every day | ORAL | Status: DC

## 2011-02-02 NOTE — Progress Notes (Signed)
  Subjective:    Patient ID: Theresa Garcia, female    DOB: 10-31-1940, 70 y.o.   MRN: [id number]  HPI Pt in for f/u from recent surgery gor  Kidney stones, reports having no energy. Appetite is fairly good, denies any recent fever or chills , c/o back pain.Also reports mild depression   Review of Systems See HPI Denies recent fever or chills. Denies sinus pressure, nasal congestion, ear pain or sore throat. Denies chest congestion, productive cough or wheezing. Denies chest pains, palpitations and leg swelling Denies abdominal pain, nausea, vomiting,diarrhea or constipation.   .Mild frequency and dysuria Denies headaches, seizures, numbness, or tingling. Denies depression, anxiety or insomnia.Denies skin break down or rash.        Objective:   Physical Exam Patient alert and oriented and in no cardiopulmonary distress.  HEENT: No facial asymmetry, EOMI, no sinus tenderness,  oropharynx pink and moist.  Neck supple no adenopathy.  Chest: Clear to auscultation bilaterally.  CVS: S1, S2 no murmurs, no S3.  ABD: Soft non tender. Bowel sounds normal.  Ext: No edema  MS: Adequate ROM spine, shoulders, hips and knees.  Skin: Intact, no ulcerations or rash noted.  Psych: Good eye contact,flat  affect. Memory intact not anxious o depressed appearing.  CNS: CN 2-12 intact, power, tone and sensation normal throughout.        Assessment & Plan:

## 2011-02-02 NOTE — Patient Instructions (Addendum)
F/u in mid January  New medication for back pain, for use at night , also xrays of the back today.  Cbc , chem 7 and TSH today to evaluate fatigue and dehydration.  Urine is being sent for culture.  New medication for depression  Please ensure you drink at least 60 ounces of water every day, your urine suggests you are mildly dehydrated

## 2011-02-02 NOTE — Assessment & Plan Note (Signed)
approx 3 week h/o mid to low back pain, not radiaiting to legs rated at an 8 keeps her awake, states similar to her pain when she had breast cancer

## 2011-02-03 LAB — COMPREHENSIVE METABOLIC PANEL
ALT: 14 U/L (ref 0–35)
AST: 17 U/L (ref 0–37)
Albumin: 4.3 g/dL (ref 3.5–5.2)
Alkaline Phosphatase: 52 U/L (ref 39–117)
BUN: 17 mg/dL (ref 6–23)
CO2: 23 mEq/L (ref 19–32)
Calcium: 9.6 mg/dL (ref 8.4–10.5)
Chloride: 106 mEq/L (ref 96–112)
Creat: 0.86 mg/dL (ref 0.50–1.10)
Glucose, Bld: 77 mg/dL (ref 70–99)
Potassium: 4.1 mEq/L (ref 3.5–5.3)
Sodium: 139 mEq/L (ref 135–145)
Total Bilirubin: 0.4 mg/dL (ref 0.3–1.2)
Total Protein: 6.7 g/dL (ref 6.0–8.3)

## 2011-02-03 LAB — CBC
HCT: 39.7 % (ref 36.0–46.0)
Hemoglobin: 13.1 g/dL (ref 12.0–15.0)
MCH: 31 pg (ref 26.0–34.0)
MCHC: 33 g/dL (ref 30.0–36.0)
MCV: 94.1 fL (ref 78.0–100.0)
Platelets: 292 10*3/uL (ref 150–400)
RBC: 4.22 MIL/uL (ref 3.87–5.11)
RDW: 13.5 % (ref 11.5–15.5)
WBC: 9.4 10*3/uL (ref 4.0–10.5)

## 2011-02-03 LAB — TSH: TSH: 0.64 u[IU]/mL (ref 0.350–4.500)

## 2011-02-05 ENCOUNTER — Ambulatory Visit: Payer: Medicare Other | Admitting: Family Medicine

## 2011-02-06 ENCOUNTER — Telehealth: Payer: Self-pay | Admitting: Family Medicine

## 2011-02-06 LAB — URINE CULTURE: Colony Count: >=100000

## 2011-02-06 MED ORDER — CIPROFLOXACIN HCL 500 MG PO TABS: 500.0000 mg | ORAL_TABLET | Freq: Two times a day (BID) | ORAL | Status: AC

## 2011-02-06 NOTE — Telephone Encounter (Signed)
Advise her to stop pill if she feels it is bothering her, nothing else will be substituted att this time, take med for UTI I sent in

## 2011-02-06 NOTE — Telephone Encounter (Signed)
Spoke with pt and she understands to continue the entire course of the ABT and to discontinue the antidepressant if the side effects are too much for her.

## 2011-02-06 NOTE — Assessment & Plan Note (Signed)
Urinalysis mildly abnormal , will await c/s prior to treatment

## 2011-02-06 NOTE — Assessment & Plan Note (Signed)
Controlled, no change in medication  

## 2011-02-06 NOTE — Assessment & Plan Note (Signed)
Low fat diet encouraged, no change in medication

## 2011-02-06 NOTE — Assessment & Plan Note (Signed)
Deteriorated, will start medication 

## 2011-02-23 ENCOUNTER — Ambulatory Visit (HOSPITAL_COMMUNITY)
Admission: RE | Admit: 2011-02-23 | Discharge: 2011-02-23 | Disposition: A | Discharge status: Home / Self Care | Payer: Medicare Other | Source: Ambulatory Visit | Attending: Family Medicine | Admitting: Family Medicine

## 2011-02-23 ENCOUNTER — Other Ambulatory Visit: Payer: Self-pay | Admitting: Family Medicine

## 2011-02-23 DIAGNOSIS — R109 Unspecified abdominal pain: Secondary | ICD-10-CM | POA: Insufficient documentation

## 2011-02-23 DIAGNOSIS — N83209 Unspecified ovarian cyst, unspecified side: Secondary | ICD-10-CM

## 2011-02-25 ENCOUNTER — Telehealth: Payer: Self-pay | Admitting: Family Medicine

## 2011-02-25 ENCOUNTER — Other Ambulatory Visit: Payer: Self-pay | Admitting: Family Medicine

## 2011-02-25 DIAGNOSIS — N83209 Unspecified ovarian cyst, unspecified side: Secondary | ICD-10-CM

## 2011-02-25 NOTE — Telephone Encounter (Signed)
Spoke wit pt , discusssed the result of pelvic US, she has complex right ovarian cyst, will refer to gynae to follow.  She is to resume prozac, took one today states energy improved, using tylenol for aches from head to toe , encouraged to continue as needed

## 2011-02-26 ENCOUNTER — Telehealth: Payer: Self-pay | Admitting: Family Medicine

## 2011-02-26 NOTE — Telephone Encounter (Signed)
Appointment is made and patient is aware

## 2011-02-26 NOTE — Telephone Encounter (Signed)
Patient is aware 

## 2011-03-02 DIAGNOSIS — N83209 Unspecified ovarian cyst, unspecified side: Secondary | ICD-10-CM | POA: Diagnosis not present

## 2011-03-03 ENCOUNTER — Telehealth: Payer: Self-pay | Admitting: Family Medicine

## 2011-03-03 NOTE — Telephone Encounter (Signed)
States she is to have a hysterectomy which will be done in the near future. I denied her request to be excused from court, insufficient medical grds, but suggested she see if the gynae who will operate feels this is a reason to provide letter requested.

## 2011-03-09 DIAGNOSIS — N83209 Unspecified ovarian cyst, unspecified side: Secondary | ICD-10-CM | POA: Diagnosis not present

## 2011-03-11 ENCOUNTER — Encounter: Payer: Self-pay | Admitting: Family Medicine

## 2011-03-13 ENCOUNTER — Encounter: Payer: Self-pay | Admitting: Family Medicine

## 2011-03-16 ENCOUNTER — Telehealth: Payer: Self-pay | Admitting: Family Medicine

## 2011-03-16 ENCOUNTER — Ambulatory Visit: Payer: Medicare Other | Admitting: Family Medicine

## 2011-03-16 NOTE — Telephone Encounter (Signed)
noted 

## 2011-03-16 NOTE — Telephone Encounter (Signed)
Will forward to Dr

## 2011-03-26 DIAGNOSIS — E785 Hyperlipidemia, unspecified: Secondary | ICD-10-CM | POA: Diagnosis not present

## 2011-03-26 DIAGNOSIS — Z780 Asymptomatic menopausal state: Secondary | ICD-10-CM | POA: Diagnosis not present

## 2011-03-26 DIAGNOSIS — N83209 Unspecified ovarian cyst, unspecified side: Secondary | ICD-10-CM | POA: Diagnosis not present

## 2011-03-26 DIAGNOSIS — Z79899 Other long term (current) drug therapy: Secondary | ICD-10-CM | POA: Diagnosis not present

## 2011-03-26 DIAGNOSIS — Z853 Personal history of malignant neoplasm of breast: Secondary | ICD-10-CM | POA: Diagnosis not present

## 2011-03-26 DIAGNOSIS — Z803 Family history of malignant neoplasm of breast: Secondary | ICD-10-CM | POA: Diagnosis not present

## 2011-03-26 DIAGNOSIS — I10 Essential (primary) hypertension: Secondary | ICD-10-CM | POA: Diagnosis not present

## 2011-03-30 DIAGNOSIS — Z853 Personal history of malignant neoplasm of breast: Secondary | ICD-10-CM | POA: Diagnosis not present

## 2011-03-30 DIAGNOSIS — Z803 Family history of malignant neoplasm of breast: Secondary | ICD-10-CM | POA: Diagnosis not present

## 2011-03-30 DIAGNOSIS — I10 Essential (primary) hypertension: Secondary | ICD-10-CM | POA: Diagnosis not present

## 2011-03-30 DIAGNOSIS — N838 Other noninflammatory disorders of ovary, fallopian tube and broad ligament: Secondary | ICD-10-CM | POA: Diagnosis not present

## 2011-03-30 DIAGNOSIS — N83209 Unspecified ovarian cyst, unspecified side: Secondary | ICD-10-CM | POA: Diagnosis not present

## 2011-03-30 DIAGNOSIS — Z79899 Other long term (current) drug therapy: Secondary | ICD-10-CM | POA: Diagnosis not present

## 2011-03-30 DIAGNOSIS — E785 Hyperlipidemia, unspecified: Secondary | ICD-10-CM | POA: Diagnosis not present

## 2011-03-31 DIAGNOSIS — Z803 Family history of malignant neoplasm of breast: Secondary | ICD-10-CM | POA: Diagnosis not present

## 2011-03-31 DIAGNOSIS — I10 Essential (primary) hypertension: Secondary | ICD-10-CM | POA: Diagnosis not present

## 2011-03-31 DIAGNOSIS — Z853 Personal history of malignant neoplasm of breast: Secondary | ICD-10-CM | POA: Diagnosis not present

## 2011-03-31 DIAGNOSIS — Z79899 Other long term (current) drug therapy: Secondary | ICD-10-CM | POA: Diagnosis not present

## 2011-03-31 DIAGNOSIS — E785 Hyperlipidemia, unspecified: Secondary | ICD-10-CM | POA: Diagnosis not present

## 2011-03-31 DIAGNOSIS — N83209 Unspecified ovarian cyst, unspecified side: Secondary | ICD-10-CM | POA: Diagnosis not present

## 2011-04-03 ENCOUNTER — Ambulatory Visit: Payer: Medicare Other | Admitting: Family Medicine

## 2011-04-17 NOTE — Telephone Encounter (Signed)
Pt aware.

## 2011-05-06 ENCOUNTER — Encounter (HOSPITAL_COMMUNITY): Payer: Self-pay | Admitting: *Deleted

## 2011-05-06 ENCOUNTER — Emergency Department (HOSPITAL_COMMUNITY)
Admission: EM | Admit: 2011-05-06 | Discharge: 2011-05-06 | Disposition: A | Discharge status: Home / Self Care | Payer: Medicare Other | Attending: Emergency Medicine | Admitting: Emergency Medicine

## 2011-05-06 ENCOUNTER — Emergency Department (HOSPITAL_COMMUNITY): Payer: Medicare Other

## 2011-05-06 DIAGNOSIS — K219 Gastro-esophageal reflux disease without esophagitis: Secondary | ICD-10-CM | POA: Diagnosis not present

## 2011-05-06 DIAGNOSIS — M1990 Unspecified osteoarthritis, unspecified site: Secondary | ICD-10-CM | POA: Diagnosis not present

## 2011-05-06 DIAGNOSIS — M549 Dorsalgia, unspecified: Secondary | ICD-10-CM

## 2011-05-06 DIAGNOSIS — R319 Hematuria, unspecified: Secondary | ICD-10-CM | POA: Insufficient documentation

## 2011-05-06 DIAGNOSIS — M545 Low back pain, unspecified: Secondary | ICD-10-CM | POA: Diagnosis not present

## 2011-05-06 DIAGNOSIS — E785 Hyperlipidemia, unspecified: Secondary | ICD-10-CM | POA: Insufficient documentation

## 2011-05-06 DIAGNOSIS — F411 Generalized anxiety disorder: Secondary | ICD-10-CM | POA: Insufficient documentation

## 2011-05-06 DIAGNOSIS — R109 Unspecified abdominal pain: Secondary | ICD-10-CM | POA: Insufficient documentation

## 2011-05-06 DIAGNOSIS — K5730 Diverticulosis of large intestine without perforation or abscess without bleeding: Secondary | ICD-10-CM | POA: Diagnosis not present

## 2011-05-06 DIAGNOSIS — M47817 Spondylosis without myelopathy or radiculopathy, lumbosacral region: Secondary | ICD-10-CM | POA: Insufficient documentation

## 2011-05-06 DIAGNOSIS — Z79899 Other long term (current) drug therapy: Secondary | ICD-10-CM | POA: Diagnosis not present

## 2011-05-06 DIAGNOSIS — N200 Calculus of kidney: Secondary | ICD-10-CM | POA: Insufficient documentation

## 2011-05-06 DIAGNOSIS — I10 Essential (primary) hypertension: Secondary | ICD-10-CM | POA: Insufficient documentation

## 2011-05-06 DIAGNOSIS — N289 Disorder of kidney and ureter, unspecified: Secondary | ICD-10-CM | POA: Diagnosis not present

## 2011-05-06 DIAGNOSIS — Z7982 Long term (current) use of aspirin: Secondary | ICD-10-CM | POA: Insufficient documentation

## 2011-05-06 HISTORY — DX: Calculus of kidney: N20.0

## 2011-05-06 LAB — CBC
HCT: 38.7 % (ref 36.0–46.0)
Hemoglobin: 13.1 g/dL (ref 12.0–15.0)
MCH: 30.8 pg (ref 26.0–34.0)
MCHC: 33.9 g/dL (ref 30.0–36.0)
MCV: 90.8 fL (ref 78.0–100.0)
Platelets: 235 10*3/uL (ref 150–400)
RBC: 4.26 MIL/uL (ref 3.87–5.11)
RDW: 13 % (ref 11.5–15.5)
WBC: 9.9 10*3/uL (ref 4.0–10.5)

## 2011-05-06 LAB — URINALYSIS, ROUTINE W REFLEX MICROSCOPIC
Bilirubin Urine: NEGATIVE
Glucose, UA: NEGATIVE mg/dL
Ketones, ur: NEGATIVE mg/dL
Leukocytes, UA: NEGATIVE
Nitrite: NEGATIVE
Protein, ur: NEGATIVE mg/dL
Specific Gravity, Urine: 1.01 (ref 1.005–1.030)
Urobilinogen, UA: 0.2 mg/dL (ref 0.0–1.0)
pH: 5.5 (ref 5.0–8.0)

## 2011-05-06 LAB — BASIC METABOLIC PANEL
BUN: 16 mg/dL (ref 6–23)
CO2: 25 mEq/L (ref 19–32)
Calcium: 10.4 mg/dL (ref 8.4–10.5)
Chloride: 105 mEq/L (ref 96–112)
Creatinine, Ser: 0.78 mg/dL (ref 0.50–1.10)
GFR calc Af Amer: >90 mL/min (ref >90)
GFR calc non Af Amer: 84 mL/min — ABNORMAL LOW (ref >90)
Glucose, Bld: 137 mg/dL — ABNORMAL HIGH (ref 70–99)
Potassium: 3.5 mEq/L (ref 3.5–5.1)
Sodium: 141 mEq/L (ref 135–145)

## 2011-05-06 LAB — DIFFERENTIAL
Basophils Absolute: 0 10*3/uL (ref 0.0–0.1)
Basophils Relative: 0 % (ref 0–1)
Eosinophils Absolute: 0.1 10*3/uL (ref 0.0–0.7)
Eosinophils Relative: 1 % (ref 0–5)
Lymphocytes Relative: 26 % (ref 12–46)
Lymphs Abs: 2.5 10*3/uL (ref 0.7–4.0)
Monocytes Absolute: 0.4 10*3/uL (ref 0.1–1.0)
Monocytes Relative: 4 % (ref 3–12)
Neutro Abs: 6.9 10*3/uL (ref 1.7–7.7)
Neutrophils Relative %: 69 % (ref 43–77)

## 2011-05-06 LAB — URINE MICROSCOPIC-ADD ON

## 2011-05-06 MED ORDER — OXYCODONE-ACETAMINOPHEN 5-325 MG PO TABS: 1.0000 | ORAL_TABLET | ORAL | Status: AC | PRN

## 2011-05-06 MED ORDER — OXYCODONE-ACETAMINOPHEN 5-325 MG PO TABS
1.0000 | ORAL_TABLET | Freq: Once | ORAL | Status: AC
  Administered 2011-05-06: 1 via ORAL
  Filled 2011-05-06: qty 1

## 2011-05-06 NOTE — ED Notes (Signed)
Pt and family under the impression pt was a direct admit. Pt has no orders and no bed assigned to pt. Pt and family aware

## 2011-05-06 NOTE — Discharge Instructions (Signed)
Use heat on your back 3-4 times a day.  Degenerative Arthritis You have osteoarthritis. This is the wear and tear arthritis that comes with aging. It is also called degenerative arthritis. This is common in people past middle age. It is caused by stress on the joints. The large weight bearing joints of the lower extremities are most often affected. The knees, hips, back, neck, and hands can become painful, swollen, and stiff. This is the most common type of arthritis. It comes on with age, carrying too much weight, or from an injury. Treatment includes resting the sore joint until the pain and swelling improve. Crutches or a walker may be needed for severe flares. Only take over-the-counter or prescription medicines for pain, discomfort, or fever as directed by your caregiver. Local heat therapy may improve motion. Cortisone shots into the joint are sometimes used to reduce pain and swelling during flares. Osteoarthritis is usually not crippling and progresses slowly. There are things you can do to decrease pain:  Avoid high impact activities.   Exercise regularly.   Low impact exercises such as walking, biking and swimming help to keep the muscles strong and keep normal joint function.   Stretching helps to keep your range of motion.   Lose weight if you are overweight. This reduces joint stress.  In severe cases when you have pain at rest or increasing disability, joint surgery may be helpful. See your caregiver for follow-up treatment as recommended.  SEEK IMMEDIATE MEDICAL CARE IF:   You have severe joint pain.   Marked swelling and redness in your joint develops.   You develop a high fever.  Document Released: 02/09/2005 Document Revised: 01/29/2011 Document Reviewed: 07/12/2006 [facility] Patient Information 2012 [place], [place].Back Exercises Back exercises help treat and prevent back injuries. The goal of back exercises is to increase the strength of your abdominal and back muscles  and the flexibility of your back. These exercises should be started when you no longer have back pain. Back exercises include:  Pelvic Tilt. Lie on your back with your knees bent. Tilt your pelvis until the lower part of your back is against the floor. Hold this position 5 to 10 sec and repeat 5 to 10 times.   Knee to Chest. Pull first 1 knee up against your chest and hold for 20 to 30 seconds, repeat this with the other knee, and then both knees. This may be done with the other leg straight or bent, whichever feels better.   Sit-Ups or Curl-Ups. Bend your knees 90 degrees. Start with tilting your pelvis, and do a partial, slow sit-up, lifting your trunk only 30 to 45 degrees off the floor. Take at least 2 to 3 seconds for each sit-up. Do not do sit-ups with your knees out straight. If partial sit-ups are difficult, simply do the above but with only tightening your abdominal muscles and holding it as directed.   Hip-Lift. Lie on your back with your knees flexed 90 degrees. Push down with your feet and shoulders as you raise your hips a couple inches off the floor; hold for 10 seconds, repeat 5 to 10 times.   Back arches. Lie on your stomach, propping yourself up on bent elbows. Slowly press on your hands, causing an arch in your low back. Repeat 3 to 5 times. Any initial stiffness and discomfort should lessen with repetition over time.   Shoulder-Lifts. Lie face down with arms beside your body. Keep hips and torso pressed to floor as you slowly  lift your head and shoulders off the floor.  Do not overdo your exercises, especially in the beginning. Exercises may cause you some mild back discomfort which lasts for a few minutes; however, if the pain is more severe, or lasts for more than 15 minutes, do not continue exercises until you see your caregiver. Improvement with exercise therapy for back problems is slow.  See your caregivers for assistance with developing a proper back exercise  program. Document Released: 03/19/2004 Document Revised: 01/29/2011 Document Reviewed: 02/09/2005 [facility] Patient Information 2012 [place], [place].Back Pain, Adult Low back pain is very common. About 1 in 5 people have back pain.The cause of low back pain is rarely dangerous. The pain often gets better over time.About half of people with a sudden onset of back pain feel better in just 2 weeks. About 8 in 10 people feel better by 6 weeks.  CAUSES Some common causes of back pain include:  Strain of the muscles or ligaments supporting the spine.   Wear and tear (degeneration) of the spinal discs.   Arthritis.   Direct injury to the back.  DIAGNOSIS Most of the time, the direct cause of low back pain is not known.However, back pain can be treated effectively even when the exact cause of the pain is unknown.Answering your caregiver's questions about your overall health and symptoms is one of the most accurate ways to make sure the cause of your pain is not dangerous. If your caregiver needs more information, he or she may order lab work or imaging tests (X-rays or MRIs).However, even if imaging tests show changes in your back, this usually does not require surgery. HOME CARE INSTRUCTIONS For many people, back pain returns.Since low back pain is rarely dangerous, it is often a condition that people can learn to [facility] their own.   Remain active. It is stressful on the back to sit or stand in one place. Do not sit, drive, or stand in one place for more than 30 minutes at a time. Take short walks on level surfaces as soon as pain allows.Try to increase the length of time you walk each day.   Do not stay in bed.Resting more than 1 or 2 days can delay your recovery.   Do not avoid exercise or work.Your body is made to move.It is not dangerous to be active, even though your back may hurt.Your back will likely heal faster if you return to being active before your pain is gone.   Pay  attention to your body when you bend and lift. Many people have less discomfortwhen lifting if they bend their knees, keep the load close to their bodies,and avoid twisting. Often, the most comfortable positions are those that put less stress on your recovering back.   Find a comfortable position to sleep. Use a firm mattress and lie on your side with your knees slightly bent. If you lie on your back, put a pillow under your knees.   Only take over-the-counter or prescription medicines as directed by your caregiver. Over-the-counter medicines to reduce pain and inflammation are often the most helpful.Your caregiver may prescribe muscle relaxant drugs.These medicines help dull your pain so you can more quickly return to your normal activities and healthy exercise.   Put ice on the injured area.   Put ice in a plastic bag.   Place a towel between your skin and the bag.   Leave the ice on for 15 to 20 minutes, 3 to 4 times a day for the  first 2 to 3 days. After that, ice and heat may be alternated to reduce pain and spasms.   Ask your caregiver about trying back exercises and gentle massage. This may be of some benefit.   Avoid feeling anxious or stressed.Stress increases muscle tension and can worsen back pain.It is important to recognize when you are anxious or stressed and learn ways to manage it.Exercise is a great option.  SEEK MEDICAL CARE IF:  You have pain that is not relieved with rest or medicine.   You have pain that does not improve in 1 week.   You have new symptoms.   You are generally not feeling well.  SEEK IMMEDIATE MEDICAL CARE IF:   You have pain that radiates from your back into your legs.   You develop new bowel or bladder control problems.   You have unusual weakness or numbness in your arms or legs.   You develop nausea or vomiting.   You develop abdominal pain.   You feel faint.  Document Released: 02/09/2005 Document Revised: 01/29/2011 Document  Reviewed: 06/30/2010 [facility] Patient Information 2012 [place], [place].

## 2011-05-06 NOTE — ED Notes (Signed)
Pt and family request not to see dr Frann Rider. edp aware

## 2011-05-06 NOTE — ED Notes (Signed)
Pt not ready for discharge. Breathing tx not completed

## 2011-05-06 NOTE — ED Provider Notes (Signed)
History     CSN: [id number]  Arrival date & time 05/06/11  1615   First MD Initiated Contact with Patient 05/06/11 1630      Chief Complaint  Patient presents with  . Hematuria    (Consider location/radiation/quality/duration/timing/severity/associated sxs/prior treatment) HPI Comments: Theresa Garcia is a 71 y.o. female who states she went to her gynecologist office today gave a urine sample and was told there was blood in it. So was sent here. Her gynecologist did not examine her. She had a hysterectomy about 5 weeks ago. She does not know the reason why. She had basket extraction of kidney stone 4 weeks ago. His ongoing low back pain since the kidney stone extraction. She has  not know if she has persistent hematuria. Denies fever, vomiting, diarrhea. She was able to eat today. She has been using her usual medications without relief.  Patient is a 71 y.o. female presenting with hematuria.  Hematuria    Past Medical History  Diagnosis Date  . Insomnia   . Hyperlipidemia   . Hypertension   . Cancer   . Pneumonia   . GERD (gastroesophageal reflux disease)   . Bronchitis   . Renal disorder   . Kidney stone   . Kidney stone     Past Surgical History  Procedure Date  . Abdominal hysterectomy   . Breast surgery     left  . Tubal ligation   . Cystoscopy/retrograde/ureteroscopy/stone extraction with basket 01/07/2011    Procedure: CYSTOSCOPY/RETROGRADE/URETEROSCOPY/STONE EXTRACTION WITH BASKET;  Surgeon: Ky Barban;  Location: [facility];  Service: Urology;  Laterality: Left;  Balloon Dilatation; stone given to family per MD    Family History  Problem Relation Age of Onset  . COPD Father   . COPD Sister   . COPD Sister   . Diabetes Sister   . Diabetes Brother   . Heart disease Brother     History  Substance Use Topics  . Smoking status: Never Smoker   . Smokeless tobacco: Not on file  . Alcohol Use: No    OB History    Grav Para Term Preterm Abortions  TAB SAB Ect Mult Living                  Review of Systems  Genitourinary: Positive for hematuria.  All other systems reviewed and are negative.    Allergies  Penicillins  Home Medications   Current Outpatient Rx  Name Route Sig Dispense Refill  . AMLODIPINE BESY-BENAZEPRIL HCL 5-40 MG PO CAPS Oral Take 1 capsule by mouth daily. 90 capsule 1  . ASPIRIN EC 81 MG PO TBEC Oral Take 81 mg by mouth 2 (two) times a week.    . ATORVASTATIN CALCIUM 20 MG PO TABS Oral Take 20 mg by mouth every morning.    Marland Kitchen DICLOFENAC-MISOPROSTOL 75-200 MG-MCG PO TABS Oral Take 1 tablet by mouth daily. **May take one tablet twice daily if needed**    . FLUOXETINE HCL 10 MG PO CAPS Oral Take 10 mg by mouth daily as needed. For depression    . SUCRALFATE 1 G PO TABS Oral Take 1 g by mouth daily as needed. With a meal    . TEMAZEPAM 30 MG PO CAPS Oral Take 1 capsule (30 mg total) by mouth at bedtime as needed. 90 capsule 1  . OXYCODONE-ACETAMINOPHEN 5-325 MG PO TABS Oral Take 1 tablet by mouth every 4 (four) hours as needed for pain. 20 tablet 0  BP 144/69  Pulse 67  Temp(Src) 98.3 F (36.8 C) (Oral)  Resp 20  Ht 5\' 7"  (1.702 m)  Wt 171 lb (77.565 kg)  BMI 26.78 kg/m2  SpO2 96%  Physical Exam  Nursing note and vitals reviewed. Constitutional: She is oriented to person, place, and time. She appears well-developed and well-nourished. She appears distressed (mild).  HENT:  Head: Normocephalic and atraumatic.  Eyes: Conjunctivae and EOM are normal. Pupils are equal, round, and reactive to light.  Neck: Normal range of motion and phonation normal. Neck supple.  Cardiovascular: Normal rate, regular rhythm and intact distal pulses.   Pulmonary/Chest: Effort normal and breath sounds normal. She exhibits no tenderness.  Abdominal: Soft. She exhibits no distension. There is no tenderness. There is no guarding.       Mild lateral lower quadrant tenderness, that she states is persistent since her  hysterectomy 5 weeks ago  Genitourinary:       No costal vertebral angle percussive tenderness  Musculoskeletal: Normal range of motion.       Mild lumbar tenderness bilaterally. She has pain in this area when going from supine to sitting  Neurological: She is alert and oriented to person, place, and time. She has normal strength. She exhibits normal muscle tone.  Skin: Skin is warm and dry.  Psychiatric: Her behavior is normal. Thought content normal.       She is anxious    ED Course  Procedures (including critical care time)  Initial impression: Pain is more likely musculoskeletal than neurologic in etiology. The patient did have lumbar spine films done 12/12, that were normal. She has not had recent or numerous CT scans. It would be best to evaluate her with a CT scan of the abdomen and pelvis for urologic source of pain.    Labs Reviewed  URINALYSIS, ROUTINE W REFLEX MICROSCOPIC - Abnormal; Notable for the following:    Hgb urine dipstick TRACE (*)    All other components within normal limits  BASIC METABOLIC PANEL - Abnormal; Notable for the following:    Glucose, Bld 137 (*)    GFR calc non Af Amer 84 (*)    All other components within normal limits  CBC  DIFFERENTIAL  URINE MICROSCOPIC-ADD ON   Ct Abdomen Pelvis Wo Contrast  05/06/2011  *RADIOLOGY REPORT*  Clinical Data: Flank pain.  History of nephrolithiasis.  CT ABDOMEN AND PELVIS WITHOUT CONTRAST  Technique:  Multidetector CT imaging of the abdomen and pelvis was performed following the standard protocol without intravenous contrast.  Comparison: 01/04/2011  Findings: There is dependent atelectasis or consolidation posteriorly in the visualized right lower lobe.  Unremarkable uninfused evaluation of the liver, gallbladder, spleen, adrenal glands, pancreas.  Patchy aortoiliac calcified plaque without aneurysm.  No nephrolithiasis or hydronephrosis.  2.7 cm low attenuation right renal lesion possibly cyst but incompletely  characterized, stable since prior study.  No evidence of ureteral calculus.  Urinary bladder is physiologically distended.  Stomach and small bowel are decompressed.  Normal appendix. Scattered diverticula from ascending, descending, and proximal sigmoid portions of the colon without adjacent inflammatory or edematous change.  Previous hysterectomy.  Trace amount of pelvic fluid.  No free air.  Degenerative disc disease L5-S1.  IMPRESSION:  1.  Negative for nephrolithiasis, ureteral calculus, or hydronephrosis. 2.  Normal appendix. 3.  Stable right renal lesion probably cyst. 4.  Scattered colonic diverticula.  Original Report Authenticated By: Osa Craver, M.D.   6:59 PM Reevaluation with update and discussion.  After initial assessment and treatment, an updated evaluation reveals pain better, no further c/o. Iyonnah Ferrante L   1. Back pain   2. Degenerative joint disease       MDM  Nonspecific low back and abdominal pain. No apparent kidney stone or acute intra-abdominal process. She has mild, degenerative joint disease of the low back. This could be a cause for pain. Patient stable for discharge.  Plan: Home Medications- Percocet; Home Treatments- heat; Recommended follow up- PCP in 1 week        Flint Melter, MD 05/06/11 1859

## 2011-05-06 NOTE — ED Notes (Addendum)
Back pain, hematuria,  Sent by DR Lodema Hong. To be abmitted.  Pt was seen by MD in [facility] and thought to have a kidney stone.  Dr Lodema Hong notified and pt told to come here.

## 2011-05-07 ENCOUNTER — Ambulatory Visit (INDEPENDENT_AMBULATORY_CARE_PROVIDER_SITE_OTHER): Payer: Medicare Other | Admitting: Family Medicine

## 2011-05-07 ENCOUNTER — Encounter: Payer: Self-pay | Admitting: Family Medicine

## 2011-05-07 VITALS — BP 130/90 | HR 72 | Resp 16 | Ht 67.0 in | Wt 173.0 lb

## 2011-05-07 DIAGNOSIS — E785 Hyperlipidemia, unspecified: Secondary | ICD-10-CM | POA: Diagnosis not present

## 2011-05-07 DIAGNOSIS — M549 Dorsalgia, unspecified: Secondary | ICD-10-CM | POA: Diagnosis not present

## 2011-05-07 DIAGNOSIS — F32A Depression, unspecified: Secondary | ICD-10-CM

## 2011-05-07 DIAGNOSIS — F3289 Other specified depressive episodes: Secondary | ICD-10-CM

## 2011-05-07 DIAGNOSIS — F329 Major depressive disorder, single episode, unspecified: Secondary | ICD-10-CM

## 2011-05-07 DIAGNOSIS — I10 Essential (primary) hypertension: Secondary | ICD-10-CM | POA: Diagnosis not present

## 2011-05-07 MED ORDER — AMLODIPINE BESY-BENAZEPRIL HCL 5-40 MG PO CAPS: 1.0000 | ORAL_CAPSULE | Freq: Every day | ORAL | Status: DC

## 2011-05-07 MED ORDER — ATORVASTATIN CALCIUM 20 MG PO TABS: 20.0000 mg | ORAL_TABLET | ORAL | Status: DC

## 2011-05-07 MED ORDER — TEMAZEPAM 30 MG PO CAPS: 30.0000 mg | ORAL_CAPSULE | Freq: Every evening | ORAL | Status: DC | PRN

## 2011-05-07 NOTE — Progress Notes (Signed)
  Subjective:    Patient ID: Theresa Garcia, female    DOB: 03-21-1940, 71 y.o.   MRN: [id number]  HPI Pt in for Ed follow up for what she thought was back pain due to renal colic. She is in with a sibling who feels like the others that the pt is extremely depressed and needs help. At this visit , after much discussion pt herself agrees that this is the case and is in agreement to going.    Review of Systems See HPI Denies recent fever or chills. Denies sinus pressure, nasal congestion, ear pain or sore throat. Denies chest congestion, productive cough or wheezing. Denies chest pains, palpitations and leg swelling Denies abdominal pain, nausea, vomiting,diarrhea or constipation.   Denies dysuria, frequency, hesitancy or incontinence.  Denies headaches, seizures, numbness, or tingling. Denies suicidal or homicidal ideation or hallucination, though she reportedly often speaks to her deceased son. Denies skin break down or rash.        Objective:   Physical Exam Patient alert and oriented and in no cardiopulmonary distress.  HEENT: No facial asymmetry, EOMI, no sinus tenderness,  oropharynx pink and moist.  Neck supple no adenopathy.  Chest: Clear to auscultation bilaterally.  CVS: S1, S2 no murmurs, no S3.  ABD: Soft non tender. Bowel sounds normal.  Ext: No edema  MS: decreased  ROM spine,adequate in  shoulders, hips and knees.  Skin: Intact, no ulcerations or rash noted.  Psych: Good eye contact, flat  affect. Memory intact  anxious and  depressed appearing.  CNS: CN 2-12 intact, power, tone and sensation normal throughout.        Assessment & Plan:

## 2011-05-07 NOTE — Patient Instructions (Signed)
F/u in 5.5 to 6 weeks.  Use pain medication only if having significant back pain.   The scan of your abdomen and pelvis were normal and there was no blood in the urine  You are referred for therapy , I am thankful you decided to go. This will make a big difference with how you are feeling because you need it.

## 2011-05-08 ENCOUNTER — Ambulatory Visit: Payer: Medicare Other | Admitting: Family Medicine

## 2011-05-14 ENCOUNTER — Other Ambulatory Visit: Payer: Self-pay | Admitting: Family Medicine

## 2011-05-14 ENCOUNTER — Telehealth: Payer: Self-pay | Admitting: Family Medicine

## 2011-05-14 DIAGNOSIS — M549 Dorsalgia, unspecified: Secondary | ICD-10-CM

## 2011-05-14 NOTE — Telephone Encounter (Signed)
Pt was referred to dr. Eduard Clos. Pt is aware of this.

## 2011-05-14 NOTE — Telephone Encounter (Signed)
Yes I recommend Dr Eduard Clos, pls let her know and I am entering referral

## 2011-05-18 NOTE — Assessment & Plan Note (Signed)
Increased and uncontrolled. Pt called back a few days after the visit for a referral to a pain specialist

## 2011-05-18 NOTE — Assessment & Plan Note (Signed)
Controlled, no change in medication  

## 2011-05-18 NOTE — Assessment & Plan Note (Signed)
Severe and disabling trying to recover from death of her son, as well as a divorce. Is now agreeing to therapy

## 2011-05-18 NOTE — Assessment & Plan Note (Signed)
Hyperlipidemia:Low fat diet discussed and encouraged.  Updated labs needed 

## 2011-05-19 ENCOUNTER — Ambulatory Visit (INDEPENDENT_AMBULATORY_CARE_PROVIDER_SITE_OTHER): Payer: Medicare Other | Admitting: Psychiatry

## 2011-05-19 ENCOUNTER — Encounter (HOSPITAL_COMMUNITY): Payer: Self-pay | Admitting: Psychiatry

## 2011-05-19 DIAGNOSIS — F3289 Other specified depressive episodes: Secondary | ICD-10-CM

## 2011-05-19 DIAGNOSIS — F329 Major depressive disorder, single episode, unspecified: Secondary | ICD-10-CM | POA: Diagnosis not present

## 2011-05-20 ENCOUNTER — Encounter: Payer: Self-pay | Admitting: Family Medicine

## 2011-05-21 NOTE — Progress Notes (Signed)
Patient:   Theresa Garcia   DOB:   11-29-1940  MR Number:  [id number]  Location:  [address] [place], [place] [address]  Date of Service:   Tuesday 05/19/2011  Start Time:   3:05 PM End Time:   3:55 PM  Provider/Observer:  Florencia Reasons, MSW, LCSW   Billing Code/Service:  [phone]   Chief Complaint:     Chief Complaint  Patient presents with  . Depression    Reason for Service:  The patient was referred for services by primary care physician Dr. Syliva Overman due the patient experiencing symptoms of depression. Patient's sister accompanies her to the initial part of session as she is concerned  that patient has been very quiet and withdrawn since she had surgery in November, 2012 due to kidney stones. Patient had another surgery in February 2013 when she had a partial hysterectomy. Patient reports that she has been very tired and just hasn't felt like doing anything. Patient also reports being sad as she found her 73 year old son dead in his car on 05/05/2009. Per patient's report, the muffler company did not seal the muffler properly when he took the car for repairs. Her son died from carbon monoxide poisoning.  Current Status:  The patient is experiencing sadness, low energy, and decreased interest.  Reliability of Information: Reliable  Behavioral Observation: Theresa Garcia  presents as a 71 y.o.-year-old  African American Female who appeared her stated age. Her dress was appropriate and she was casual in her appearance. Her manners were appropriate to the situation.  There were not any physical disabilities noted.  She displayed an appropriate level of cooperation and motivation.    Interactions:    Active   Attention:   within normal limits  Memory:   within normal limits  Visuo-spatial:   within normal limits  Speech (Volume):  within normal limits  Speech:   soft  Thought Process:  Coherent and Relevant  Though Content:  WNL  Orientation:   person, place,  time/date, situation, day of week, month of year and year  Judgment:   Good  Planning:   Good  Affect:    Depressed, tearful  Mood:    Depressed  Insight:   Good  Intelligence:   normal  Marital Status/Living: The patient was born in [place] and reared in [place]. She is third of 10 siblings. 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 She reports being well loved by her parents and having a good relationship with her siblings. Patient moved to [place] when she graduated from high school.  She and her husband were married for 32 years before patient left the marriage as husband talked negatively about their 24 year-old son due to his disapproval of  his son's gay lifestyle.  Patient resided with her son for 4-5 years before finding him dead in his car.  Patient returned to [place] in August 2011 and now resides with her sister in [place].  Patient also has a 32 year old son who continues to reside in [place].  Current Employment: retired  Past Employment:  Patient reports a stable work history being employed at [employer] for 32 years.  Substance Use:  No concerns of substance abuse are reported.    Education:   HS Graduate  Medical History:   Past Medical History  Diagnosis Date  . Insomnia   . Hyperlipidemia   . Hypertension   . Cancer   .  Pneumonia   . GERD (gastroesophageal reflux disease)   . Bronchitis   . Renal disorder   . Kidney stone   . Kidney stone     Sexual History:   History  Sexual Activity  . Sexually Active: Not Currently    Abuse/Trauma History: Patient reports finding son dead in car due to carbon monoxide poisoning in 2011.  Psychiatric History:  Patient reports no psychiatric hospitalizations. She attended outpatient psychotherapy after her divorce.  Family Med/Psych History:  Family History  Problem Relation Age of Onset  . COPD Father   . COPD Sister   . COPD Sister   . Diabetes Sister   .  Diabetes Brother   . Heart disease Brother     Risk of Suicide/Violence: virtually non-existent.. Patient denies past and current suicidal and homicidal ideations.  She reports no history of aggression or violent behaviors.  Impression/DX:  The patient presents with symptoms of depression that have been observed by family members in the past 3-4 months. Her sister notes that patient appears to have become quiet and withdrawn since surgery in November 2012. Patient had another surgery in February 2013. Patient reports feeling tired and losing interest in activities. She also reports experiencing sadness as she found her son dead his car from carbon monoxide poisoning in February 2011.  Diagnosis: Depressive disorder NOS  Disposition/Plan:  The patient attends the assessment appointment. Confidentiality and limits are explained. She agrees to return for an appointment in 2 weeks for continuing assessment and treatment planning. She agrees to call this practice, call 911, or have someone take her to the emergency room should symptoms worsen.  Diagnosis:    Axis I:   1. Depressive disorder, not elsewhere classified         Axis II: No diagnosis       Axis III:  See medical history      Axis IV:  Death of son 2 years ago          Axis V:  51-60 moderate symptoms

## 2011-05-21 NOTE — Patient Instructions (Signed)
Discussed orally 

## 2011-05-25 ENCOUNTER — Telehealth: Payer: Self-pay | Admitting: Family Medicine

## 2011-05-29 ENCOUNTER — Other Ambulatory Visit: Payer: Self-pay

## 2011-05-29 MED ORDER — TEMAZEPAM 30 MG PO CAPS: 30.0000 mg | ORAL_CAPSULE | Freq: Every evening | ORAL | Status: DC | PRN

## 2011-05-29 NOTE — Telephone Encounter (Signed)
Med refilled.

## 2011-06-02 ENCOUNTER — Encounter (HOSPITAL_COMMUNITY): Payer: Self-pay | Admitting: Psychiatry

## 2011-06-02 ENCOUNTER — Ambulatory Visit (INDEPENDENT_AMBULATORY_CARE_PROVIDER_SITE_OTHER): Payer: Medicare Other | Admitting: Psychiatry

## 2011-06-02 DIAGNOSIS — F329 Major depressive disorder, single episode, unspecified: Secondary | ICD-10-CM

## 2011-06-02 DIAGNOSIS — F3289 Other specified depressive episodes: Secondary | ICD-10-CM | POA: Diagnosis not present

## 2011-06-03 NOTE — Patient Instructions (Signed)
Discussed orally 
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
## 2011-06-06 ENCOUNTER — Other Ambulatory Visit: Payer: Self-pay | Admitting: Family Medicine

## 2011-06-16 ENCOUNTER — Ambulatory Visit (INDEPENDENT_AMBULATORY_CARE_PROVIDER_SITE_OTHER): Payer: Medicare Other | Admitting: Psychiatry

## 2011-06-16 DIAGNOSIS — F3289 Other specified depressive episodes: Secondary | ICD-10-CM | POA: Diagnosis not present

## 2011-06-16 DIAGNOSIS — F329 Major depressive disorder, single episode, unspecified: Secondary | ICD-10-CM | POA: Diagnosis not present

## 2011-06-17 NOTE — Progress Notes (Signed)
Patient:  Theresa Garcia   DOB: 10-21-1940  MR Number: [id number]  Location: [facility]:  [address] [place],  [place], [address]  Start: Tuesday 06/16/2011 2:00 PM End: Tuesday 06/16/2011 2:50 PM  Provider/Observer:     Florencia Reasons, MSW, LCSW   Chief Complaint:      Chief Complaint  Patient presents with  . Depression    Reason For Service:     The patient was referred for services by primary care physician Dr. Syliva Overman do to be patient experiencing symptoms of depression. Patient's sister reported concerns that patient had been very quiet and withdrawn since her surgery in November, 2012 due to kidney stones. The patient had another surgery in February 2013 when she had a partial hysterectomy. Patient reports she has been very tired and just hasn't felt like doing anything. The patient also reports being sad as she found her 37 year old son did in his car on 04/21/2009. Per patient's report, the muffler company did not seal the muffler properly when he took the car for repairs. Her son died from carbon monoxide poisoning. Patient is seen for a followup appointment today  Interventions Strategy:  Supportive therapy  Participation Level:   Active  Participation Quality:  Appropriate      Behavioral Observation:  Casual, Alert, and Appropriate.   Current Psychosocial Factors: Patient reports recent conflict with the sister with whom she resides.  Content of Session:   Reviewing symptoms, developing treatment plan, processing feelings  Current Status:   The patient reports improved mood, increased involvement in activities such as completing household chores. She experiences continued sadness regarding her son.  Patient Progress:   Fair. The patient reports she has continued to feel better. She has resumed driving and plans to resume a walking regimen at the [facility] this week. She is pleased that she and her sister have discussed and resolved the recent  conflict. Patient continues to become tearful when she talks about her deceased son.She shares some of her son's accomplishments as well as some of the difficulties he faced.  She continues to rely on her spirituality as support and a way of trying to accept son's death.  She also begins to share information regarding other losses including the loss of her nieces and her brother.  Target Goals:  Process and resolve grief and loss issues.   Last Reviewed:  06/16/2011  Goals Addressed Today:    Process and resolve grief and loss issues.  Impression/Diagnosis:   The patient presented with symptoms of depression that have been observed by family members in the past 3-4 months. Her sister noted that patient appears to have become quiet and withdrawn since her surgery in November 2012. The patient had another surgery in February 2013. Patient reported feeling tired and losing interest in activities. She also reports experiencing increased sadness as she found her son dead in his car from carbon monoxide processing in February 2011. Diagnoses: Depressive disorder NOS  Diagnosis:  Axis I:  1. Depressive disorder, not elsewhere classified             Axis II: No diagnosis

## 2011-06-17 NOTE — Patient Instructions (Signed)
Discussed orally 

## 2011-06-23 ENCOUNTER — Encounter: Payer: Self-pay | Admitting: Family Medicine

## 2011-06-23 ENCOUNTER — Ambulatory Visit (INDEPENDENT_AMBULATORY_CARE_PROVIDER_SITE_OTHER): Payer: Medicare Other | Admitting: Family Medicine

## 2011-06-23 VITALS — BP 130/84 | HR 77 | Resp 15 | Ht 67.0 in | Wt 174.8 lb

## 2011-06-23 DIAGNOSIS — I10 Essential (primary) hypertension: Secondary | ICD-10-CM

## 2011-06-23 DIAGNOSIS — E785 Hyperlipidemia, unspecified: Secondary | ICD-10-CM | POA: Diagnosis not present

## 2011-06-23 DIAGNOSIS — F3289 Other specified depressive episodes: Secondary | ICD-10-CM

## 2011-06-23 DIAGNOSIS — M549 Dorsalgia, unspecified: Secondary | ICD-10-CM

## 2011-06-23 DIAGNOSIS — F329 Major depressive disorder, single episode, unspecified: Secondary | ICD-10-CM | POA: Diagnosis not present

## 2011-06-23 DIAGNOSIS — G4700 Insomnia, unspecified: Secondary | ICD-10-CM | POA: Diagnosis not present

## 2011-06-23 DIAGNOSIS — F32A Depression, unspecified: Secondary | ICD-10-CM

## 2011-06-23 MED ORDER — ATORVASTATIN CALCIUM 20 MG PO TABS: 20.0000 mg | ORAL_TABLET | ORAL | Status: DC

## 2011-06-23 MED ORDER — AMLODIPINE BESY-BENAZEPRIL HCL 5-40 MG PO CAPS: ORAL_CAPSULE | ORAL | Status: DC

## 2011-06-23 MED ORDER — CLOTRIMAZOLE-BETAMETHASONE 1-0.05 % EX CREA: TOPICAL_CREAM | Freq: Two times a day (BID) | CUTANEOUS | Status: AC

## 2011-06-23 NOTE — Patient Instructions (Addendum)
F/u in September, early  Call if you need me before  PLease follow through on your commitment to go to therapy and exercise daily for  Days each week at least   Exercise 5 days per week  Continuing with therapy   Fluoxetine to be taken EVERY day

## 2011-06-23 NOTE — Progress Notes (Signed)
  Subjective:    Patient ID: Theresa Garcia, female    DOB: 1940/09/16, 71 y.o.   MRN: [id number]  HPI The PT is here for follow up and re-evaluation of chronic medical conditions, medication management and review of any available recent lab and radiology data.  Preventive health is updated, specifically  Cancer screening and Immunization.   Questions or concerns regarding consultations or procedures which the PT has had in the interim are  Addressed.She reports benefiting from psychotherapy but is uncertain if she should continue, i counseled her of the need to continue. She has not been taking prozac regularly and i advise this is in her best interest . She has seen pain specialist re chronic back pain and reports topica med to o be useful The PT denies any adverse reactions to current medications since the last visit.  There are no new concerns.  There are no specific complaints       Review of Systems See HPI Denies recent fever or chills. Denies sinus pressure, nasal congestion, ear pain or sore throat. Denies chest congestion, productive cough or wheezing. Denies chest pains, palpitations and leg swelling Denies abdominal pain, nausea, vomiting,diarrhea or constipation.   Denies dysuria, frequency, hesitancy or incontinence.  Chronic back pain reportedly improved Denies uncontrolled  depression, anxiety or insomnia. Denies skin break down or rash.         Objective:   Physical Exam Patient alert and oriented and in no cardiopulmonary distress.  HEENT: No facial asymmetry, EOMI, no sinus tenderness,  oropharynx pink and moist.  Neck supple no adenopathy.  Chest: Clear to auscultation bilaterally.  CVS: S1, S2 no murmurs, no S3.  ABD: Soft non tender. Bowel sounds normal.  Ext: No edema  MS: Adequate though decreased  ROM spine, shoulders, hips and knees.  Skin: Intact, no ulcerations or rash noted.  Psych: Good eye contact, blunted  affect. Memory intact not  anxious or depressed appearing.  CNS: CN 2-12 intact, power, tone and sensation normal throughout.        Assessment & Plan:

## 2011-06-28 NOTE — Assessment & Plan Note (Signed)
Sleep hygiene reviewed, pt to continue medication 

## 2011-06-28 NOTE — Assessment & Plan Note (Signed)
improved

## 2011-06-28 NOTE — Assessment & Plan Note (Signed)
Controlled, no change in medication  

## 2011-06-28 NOTE — Assessment & Plan Note (Signed)
Improved with therapy, pt needs to take prescribed med regularly

## 2011-06-28 NOTE — Assessment & Plan Note (Signed)
Hyperlipidemia:Low fat diet discussed and encouraged.  Controlled, no change in medication   

## 2011-07-07 ENCOUNTER — Ambulatory Visit (INDEPENDENT_AMBULATORY_CARE_PROVIDER_SITE_OTHER): Payer: Medicare Other | Admitting: Psychiatry

## 2011-07-07 DIAGNOSIS — F3289 Other specified depressive episodes: Secondary | ICD-10-CM

## 2011-07-07 DIAGNOSIS — F329 Major depressive disorder, single episode, unspecified: Secondary | ICD-10-CM

## 2011-07-07 NOTE — Patient Instructions (Signed)
Discussed orally 

## 2011-07-07 NOTE — Progress Notes (Signed)
Patient:  Theresa Garcia   DOB: 12-22-1940  MR Number: [id number]  Location: [facility]:  [address] [place],  [place], [address]  Start: Tuesday 07/07/2011 1:05 PM End: Tuesday 07/07/2011 1:50 PM  Provider/Observer:     Florencia Reasons, MSW, LCSW   Chief Complaint:      Chief Complaint  Patient presents with  . Depression    Reason For Service:     The patient was referred for services by primary care physician Dr. Syliva Overman do to be patient experiencing symptoms of depression. Patient's sister reported concerns that patient had been very quiet and withdrawn since her surgery in November, 2012 due to kidney stones. The patient had another surgery in February 2013 when she had a partial hysterectomy. Patient reports she has been very tired and just hasn't felt like doing anything. The patient also reports being sad as she found her 46 year old son did in his car on 04/21/2009. Per patient's report, the muffler company did not seal the muffler properly when he took the car for repairs. Her son died from carbon monoxide poisoning. Patient is seen for a followup appointment today  Interventions Strategy:  Supportive therapy,grief therapy  Participation Level:   Active  Participation Quality:  Appropriate      Behavioral Observation:  Casual, Alert, and Appropriate.   Current Psychosocial Factors: Patient reports no new stressors  Content of Session:   Reviewing symptoms,  processing grief and loss issues, identifying memorializations  Current Status:   The patient reports continued improved mood, increased involvement in activities such as completing household chores. She experiences continued sadness regarding her son.  Patient Progress:   Good. The patient reports continuing to feel better. She reports increased thoughts about deceased son but reports managing Mother's Day by placing a token in a memorial garden she has for her son.  She also had support from  family and went out to dinner with her sisters. Patient also reports that her daughter-in-law recently found a family member dead in his home.  This triggered memories of patient finding her son in his car. Therapist works with patient to process trauma history and to process grief and loss issues. Patient is able to discuss son today without becoming overwhelmed although she is still tearful at times during session.  Target Goals:  Process and resolve grief and loss issues.   Last Reviewed:  06/16/2011  Goals Addressed Today:    Process and resolve grief and loss issues.  Impression/Diagnosis:   The patient presented with symptoms of depression that have been observed by family members in the past 3-4 months. Her sister noted that patient appears to have become quiet and withdrawn since her surgery in November 2012. The patient had another surgery in February 2013. Patient reported feeling tired and losing interest in activities. She also reports experiencing increased sadness as she found her son dead in his car from carbon monoxide processing in February 2011. Diagnoses: Depressive disorder NOS  Diagnosis:  Axis I:  1. Depressive disorder, not elsewhere classified             Axis II: No diagnosis

## 2011-07-28 ENCOUNTER — Ambulatory Visit (INDEPENDENT_AMBULATORY_CARE_PROVIDER_SITE_OTHER): Payer: Medicare Other | Admitting: Psychiatry

## 2011-07-28 DIAGNOSIS — F3289 Other specified depressive episodes: Secondary | ICD-10-CM | POA: Diagnosis not present

## 2011-07-28 DIAGNOSIS — F329 Major depressive disorder, single episode, unspecified: Secondary | ICD-10-CM | POA: Diagnosis not present

## 2011-07-28 NOTE — Patient Instructions (Signed)
Discussed orally 
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
## 2011-08-18 ENCOUNTER — Ambulatory Visit (INDEPENDENT_AMBULATORY_CARE_PROVIDER_SITE_OTHER): Payer: Medicare Other | Admitting: Psychiatry

## 2011-08-18 DIAGNOSIS — F3289 Other specified depressive episodes: Secondary | ICD-10-CM

## 2011-08-18 DIAGNOSIS — F329 Major depressive disorder, single episode, unspecified: Secondary | ICD-10-CM | POA: Diagnosis not present

## 2011-08-18 NOTE — Patient Instructions (Signed)
Discussed orally 

## 2011-08-18 NOTE — Progress Notes (Addendum)
Patient:  Theresa Garcia   DOB: Jun 28, 1940  MR Number: [id number]  Location: [facility]:  [address] [place],  [place], [address]  Start: Tuesday 08/18/2011 1:10 PM End: Tuesday 08/18/2011 1:55 PM  Provider/Observer:     Florencia Reasons, MSW, LCSW   Chief Complaint:      Chief Complaint  Patient presents with  . Depression    Reason For Service:     The patient was referred for services by primary care physician Dr. Syliva Overman do to patient experiencing symptoms of depression. Patient's sister reported concerns that patient had been very quiet and withdrawn since her surgery in November, 2012 due to kidney stones. The patient had another surgery in February 2013 when she had a partial hysterectomy. Patient reports she has been very tired and just hasn't felt like doing anything. The patient also reports being sad as she found her 36 year old son dead in his car on 06-May-2009. Per patient's report, the muffler company did not seal the muffler properly when he took the car for repairs. Her son died from carbon monoxide poisoning. Patient is seen for a followup appointment today  Interventions Strategy:  Supportive therapy,grief therapy  Participation Level:   Active  Participation Quality:  Appropriate      Behavioral Observation:  Casual, Alert, pleasant  Current Psychosocial Factors: Patient   Content of Session:   Reviewing symptoms,  processing feelings, identifying memorializations, discussing impact of patient's spiritual beliefs regarding grief and adversity  Current Status:   The patient reports continued improved mood, decreased anxiety, increased involvement in activity, decreased sadness.  Patient Progress:   Good. The patient reports continuing to feel better and resuming normal interest in activities.  She continues to  attend church regularly and has maintained a walking regimen reporting that she walks a mile  at the mall. She also reports more  involvement with family and friends and states visiting friends. She expresses less worry about her youngest son. She continues to think about her deceased son and is reflecting on the relief her son may have experienced. She reports that her son was under a lot of pressure before his death. She shares with therapist today that she visits her son's grave in her mind as she cannot physically go there often since  his grave is in [place]. She reports feeling comforted when she imagines visiting his grave. She also talks about her spiritual beliefs and her faith in God. Patient states that she knows she will see her son again. Patient reports her faith in God is helping her with her son's death as well as other adversities she has faced in her life. Her statements in session today reflect more hope and optimism.   Target Goals:  Process and resolve grief and loss issues.   Last Reviewed:  06/16/2011  Goals Addressed Today:    Process and resolve grief and loss issues.  Impression/Diagnosis:   The patient presented with symptoms of depression that have been observed by family members in the past 3-4 months. Her sister noted that patient appears to have become quiet and withdrawn since her surgery in November 2012. The patient had another surgery in February 2013. Patient reported feeling tired and losing interest in activities. She also reports experiencing increased sadness as she found her son dead in his car from carbon monoxide processing in February 2011. Diagnoses: Depressive disorder NOS  Diagnosis:  Axis I:  1. Depressive disorder, not elsewhere classified  Axis II: No diagnosis

## 2011-09-08 ENCOUNTER — Ambulatory Visit (INDEPENDENT_AMBULATORY_CARE_PROVIDER_SITE_OTHER): Payer: Medicare Other | Admitting: Psychiatry

## 2011-09-08 DIAGNOSIS — F329 Major depressive disorder, single episode, unspecified: Secondary | ICD-10-CM | POA: Diagnosis not present

## 2011-09-08 DIAGNOSIS — F3289 Other specified depressive episodes: Secondary | ICD-10-CM

## 2011-09-08 NOTE — Patient Instructions (Signed)
Discussed orally 

## 2011-09-08 NOTE — Progress Notes (Signed)
Patient:  Theresa Garcia   DOB: 1940-03-11  MR Number: [id number]  [facility]:  [address] [place],  [place], [address]  Start: Tuesday 09/08/2011 1:10 PM End: Tuesday 09/08/2011 1:40 PM  Provider/Observer:     Florencia Reasons, MSW, LCSW   Chief Complaint:      Chief Complaint  Patient presents with  . Depression    Reason For Service:     The patient was referred for services by primary care physician Dr. Syliva Overman do to patient experiencing symptoms of depression. Patient's sister reported concerns that patient had been very quiet and withdrawn since her surgery in November, 2012 due to kidney stones. The patient had another surgery in February 2013 when she had a partial hysterectomy. Patient reports she has been very tired and just hasn't felt like doing anything. The patient also reports being sad as she found her 69 year old son dead in his car on 05-04-2009. Per patient's report, the muffler company did not seal the muffler properly when he took the car for repairs. Her son died from carbon monoxide poisoning. Patient is seen for a followup appointment today  Interventions Strategy:  Supportive therapy  Participation Level:   Active  Participation Quality:  Appropriate      Behavioral Observation:  Casual, Alert, pleasant  Current Psychosocial Factors: Patient   Content of Session:   Reviewing symptoms,  processing feelings, reviewing goals and progress, termination  Current Status:   The patient reports continued improved mood, decreased anxiety, increased involvement in activity, and decreased sadness.  Patient Progress:   Good. The patient reports doing well since last session. She has maintained involvement in various activities including socializing with family and friends, attending church regularly, and performing daily household tasks. Patient is experiencing pleasure and joy in activities as well as resuming normal interest in  activities. She continues to miss her deceased son but is able to discuss son without becoming overwhelmed. She is able to identify ways to stay connected to her son.  Patient also continues to have strong family support and relies heavily on her spirituality and faith. Therapist and patient agree that patient has met her goals. Therefore, therapist and patient agree to close patient's case. Patient is encouraged to contact this practice should she need services in the future.   Target Goals:  Process and resolve grief and loss issues.   Last Reviewed:  09/08/2011  Goals Addressed Today:    Process and resolve grief and loss issues.  Impression/Diagnosis:   The patient presented with symptoms of depression that have been observed by family members in the past 3-4 months. Her sister noted that patient appears to have become quiet and withdrawn since her surgery in November 2012. The patient had another surgery in February 2013. Patient reported feeling tired and losing interest in activities. She also reported experiencing increased sadness as she found her son dead in his car from carbon monoxide processing in February 2011. Diagnosis: Depressive disorder NOS  Diagnosis:  Axis I:  1. Depressive disorder, not elsewhere classified             Axis II: No diagnosis       Outpatient Therapist Discharge Summary      CEIRRA BELLI    Apr 29, 1940   Admission Date:   05/19/2911    Discharge Date:    09/08/2011  Reason for Discharge:  Treatment completed   Diagnosis:   Axis I:   1. Depressive disorder, not  elsewhere classified     Axis II:  No diagnosis   Axis III:  Hyperlipidemia, hypertension, GERD   Axis IV:  Death of son   Axis V:  81-90  Comments:    Jibril Mcminn LCSW

## 2011-11-03 ENCOUNTER — Ambulatory Visit (INDEPENDENT_AMBULATORY_CARE_PROVIDER_SITE_OTHER): Payer: Medicare Other | Admitting: Family Medicine

## 2011-11-03 ENCOUNTER — Encounter: Payer: Self-pay | Admitting: Family Medicine

## 2011-11-03 VITALS — BP 120/82 | HR 79 | Resp 16 | Ht 67.0 in | Wt 171.0 lb

## 2011-11-03 DIAGNOSIS — G4700 Insomnia, unspecified: Secondary | ICD-10-CM

## 2011-11-03 DIAGNOSIS — I10 Essential (primary) hypertension: Secondary | ICD-10-CM | POA: Diagnosis not present

## 2011-11-03 DIAGNOSIS — F32A Depression, unspecified: Secondary | ICD-10-CM

## 2011-11-03 DIAGNOSIS — E785 Hyperlipidemia, unspecified: Secondary | ICD-10-CM

## 2011-11-03 DIAGNOSIS — F3289 Other specified depressive episodes: Secondary | ICD-10-CM

## 2011-11-03 DIAGNOSIS — F329 Major depressive disorder, single episode, unspecified: Secondary | ICD-10-CM

## 2011-11-03 DIAGNOSIS — M899 Disorder of bone, unspecified: Secondary | ICD-10-CM

## 2011-11-03 DIAGNOSIS — M949 Disorder of cartilage, unspecified: Secondary | ICD-10-CM

## 2011-11-03 MED ORDER — ATORVASTATIN CALCIUM 20 MG PO TABS: 20.0000 mg | ORAL_TABLET | ORAL | Status: DC

## 2011-11-03 MED ORDER — TEMAZEPAM 30 MG PO CAPS: 30.0000 mg | ORAL_CAPSULE | Freq: Every evening | ORAL | Status: DC | PRN

## 2011-11-03 MED ORDER — AMLODIPINE BESY-BENAZEPRIL HCL 5-40 MG PO CAPS: ORAL_CAPSULE | ORAL | Status: DC

## 2011-11-03 NOTE — Patient Instructions (Addendum)
F/U in January.Call if you need me before  Resume daily exercise.  Please start volunteering  Mammogram will be scheduled at checkout   lipid and cmp and vit D today   Flu vaccine today  You will get the script for zostavax, please get at pharmacy if able  Take one capsule 3 times daily of the medicine from Dr Eduard Clos for pain for 5 days to help with right forearm and thumb  discomfort , if no relief, please call Dr Eduard Clos for an appt

## 2011-11-16 NOTE — Assessment & Plan Note (Signed)
Sleep hygiene reviewed, continue meds 

## 2011-11-16 NOTE — Assessment & Plan Note (Signed)
Controlled, no change in medication DASH diet and commitment to daily physical activity for a minimum of 30 minutes discussed and encouraged, as a part of hypertension management. The importance of attaining a healthy weight is also discussed.  

## 2011-11-16 NOTE — Progress Notes (Signed)
  Subjective:    Patient ID: Theresa Garcia, female    DOB: 1941/02/11, 71 y.o.   MRN: [id number]  HPI The PT is here for follow up and re-evaluation of chronic medical conditions, medication management and review of any available recent lab and radiology data.  Preventive health is updated, specifically  Cancer screening and Immunization.   Questions or concerns regarding consultations or procedures which the PT has had in the interim are  addressed. The PT denies any adverse reactions to current medications since the last visit.   Pt c/o right forearm and thumb pain in the past 3 days, no aggravating trauma to explain same   Review of Systems See HPI Denies recent fever or chills. Denies sinus pressure, nasal congestion, ear pain or sore throat. Denies chest congestion, productive cough or wheezing. Denies chest pains, palpitations and leg swelling Denies abdominal pain, nausea, vomiting,diarrhea or constipation.   Denies dysuria, frequency, hesitancy or incontinence.  Denies headaches, seizures, numbness, or tingling. Denies depression, anxiety or insomnia.much improved with therapy reportedly Denies skin break down or rash.        Objective:   Physical Exam  Patient alert and oriented and in no cardiopulmonary distress.  HEENT: No facial asymmetry, EOMI, no sinus tenderness,  oropharynx pink and moist.  Neck supple no adenopathy.  Chest: Clear to auscultation bilaterally.  CVS: S1, S2 no murmurs, no S3.  ABD: Soft non tender. Bowel sounds normal.  Ext: No edema  MS: Adequate ROM spine, shoulders, hips and knees.  Skin: Intact, no ulcerations or rash noted.  Psych: Good eye contact, normal affect. Memory intact not anxious or depressed appearing.  CNS: CN 2-12 intact, power, tone and sensation normal throughout.       Assessment & Plan:

## 2011-11-16 NOTE — Assessment & Plan Note (Signed)
Much improved, and pt on no regular medication, has benefited from therapy

## 2011-11-16 NOTE — Assessment & Plan Note (Signed)
Hyperlipidemia:Low fat diet discussed and encouraged.  Controlled, no change in medication, updated labs needed

## 2012-01-04 ENCOUNTER — Other Ambulatory Visit: Payer: Self-pay

## 2012-01-04 MED ORDER — DICLOFENAC-MISOPROSTOL 75-0.2 MG PO TBEC: 1.0000 | DELAYED_RELEASE_TABLET | Freq: Every day | ORAL | Status: DC

## 2012-01-12 ENCOUNTER — Telehealth: Payer: Self-pay | Admitting: Family Medicine

## 2012-01-12 NOTE — Telephone Encounter (Signed)
If her sister has never been here she needs a new pt appt.

## 2012-01-13 NOTE — Telephone Encounter (Signed)
Sorry brandi its the pts sister calling to see if she can get something for a head cold. Not the sister. Theresa Garcia has really bad head cold. I offered appt but they didn't want it

## 2012-01-14 NOTE — Telephone Encounter (Signed)
Nasal congestion and head stopped up. Clear mucus. Advised sudafed and saline nasal speay. If not better in a few days to schedule appt to come in

## 2012-02-01 IMAGING — US US PELVIS COMPLETE
1 series · 13 of 25 positions shown · non-contrast
Comparison: 01/04/2011 CT

CLINICAL DATA: 70-year-old female with right ovarian/adnexal cyst
identified on recent CT.  History of hysterectomy and breast
cancer.

TRANSABDOMINAL AND TRANSVAGINAL ULTRASOUND OF PELVIS
TECHNIQUE: Both transabdominal and transvaginal ultrasound
examinations of the pelvis were performed. Transabdominal technique
was performed for global imaging of the pelvis including uterus,
ovaries, adnexal regions, and pelvic cul-de-sac.

[Series 1: us pelvis complete · 0.27mm/px · 13 of 37 slices shown]
[im 1/37]
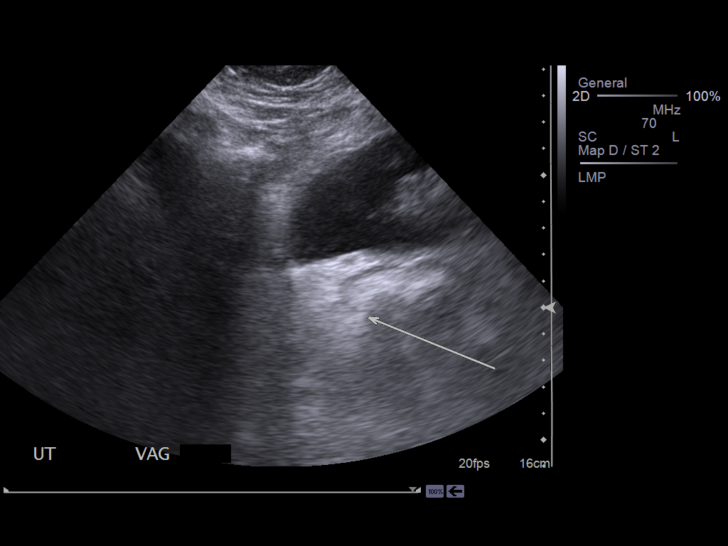
[im 4/37]
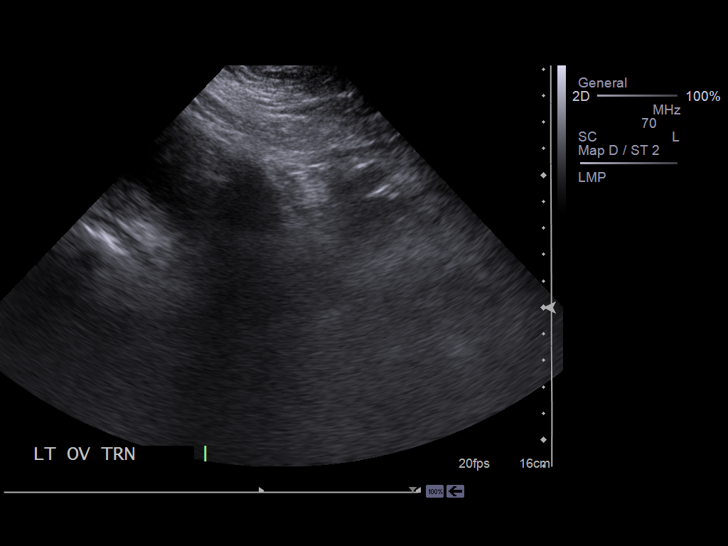
[im 7/37]
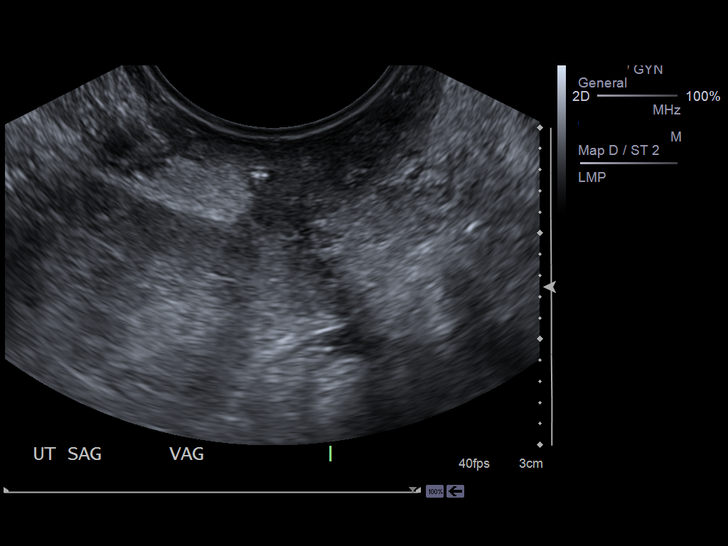
[im 10/37]
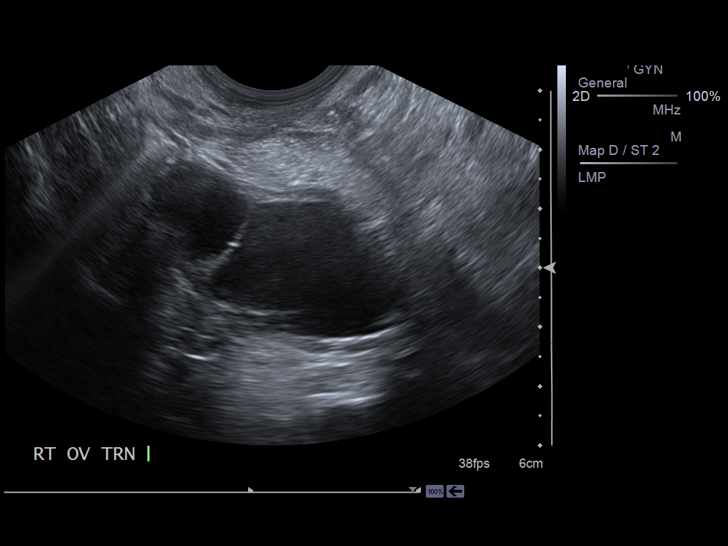
[im 13/37]
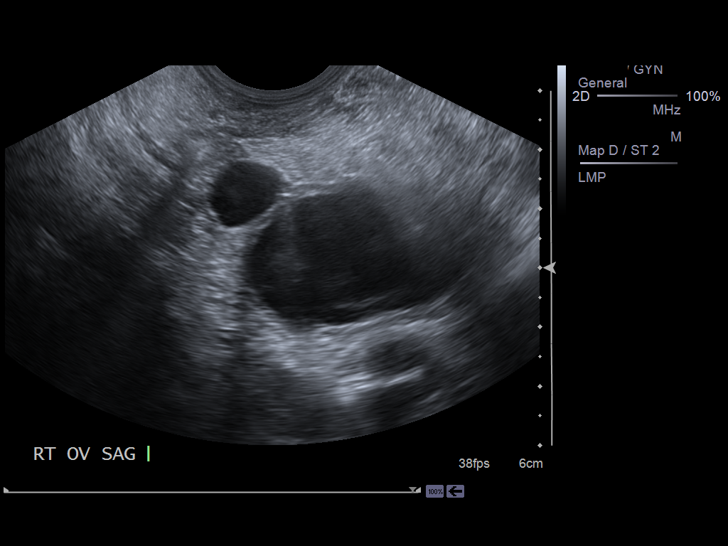
[im 16/37]
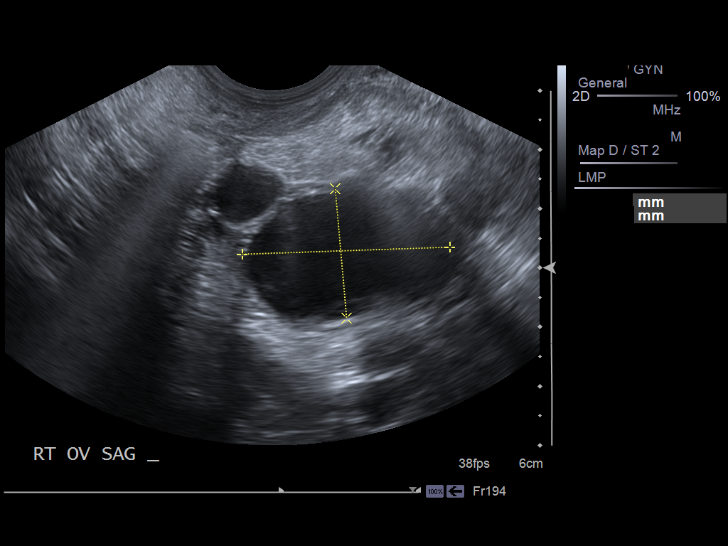
[im 19/37]
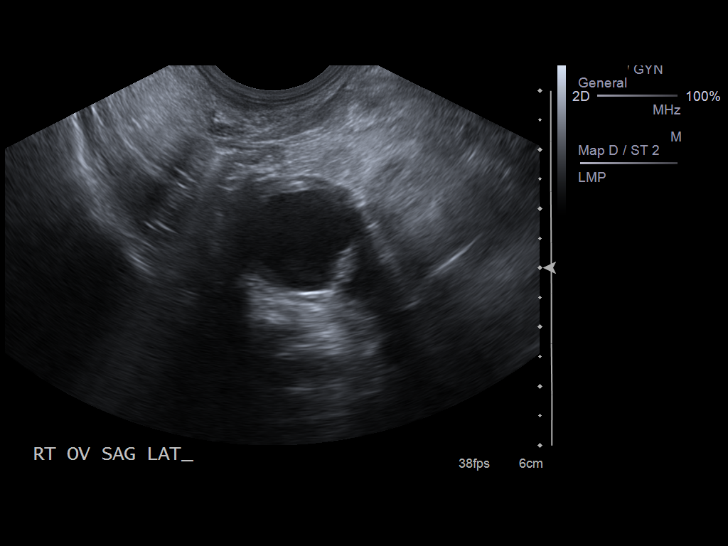
[im 22/37]
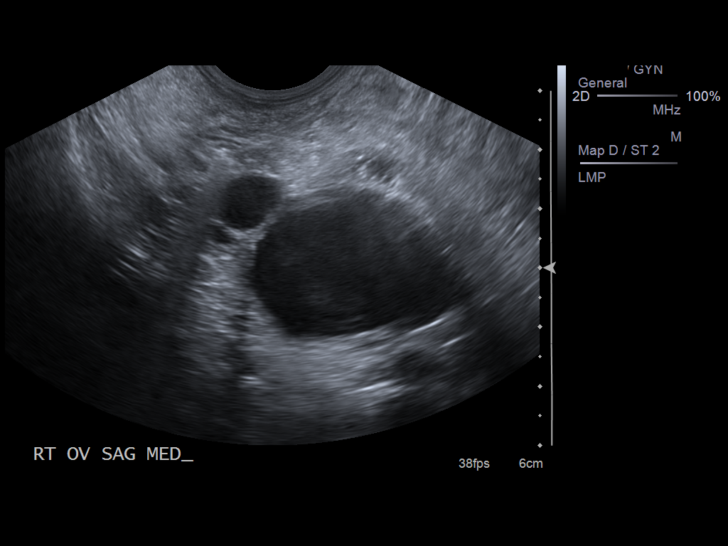
[im 25/37]
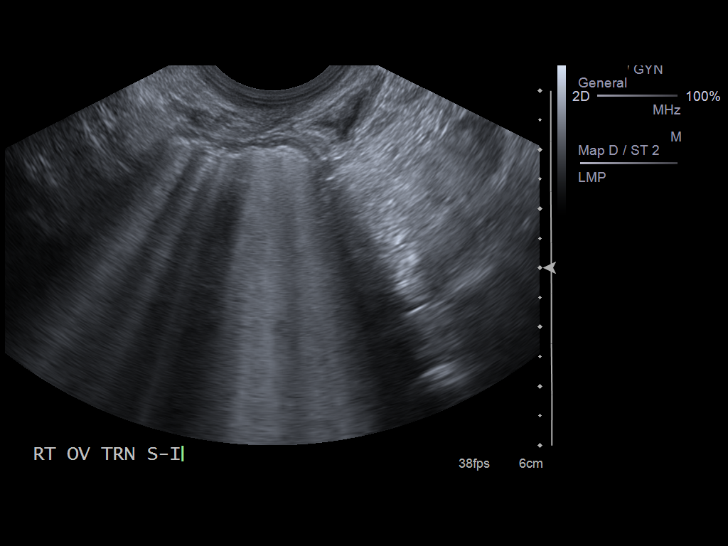
[im 28/37]
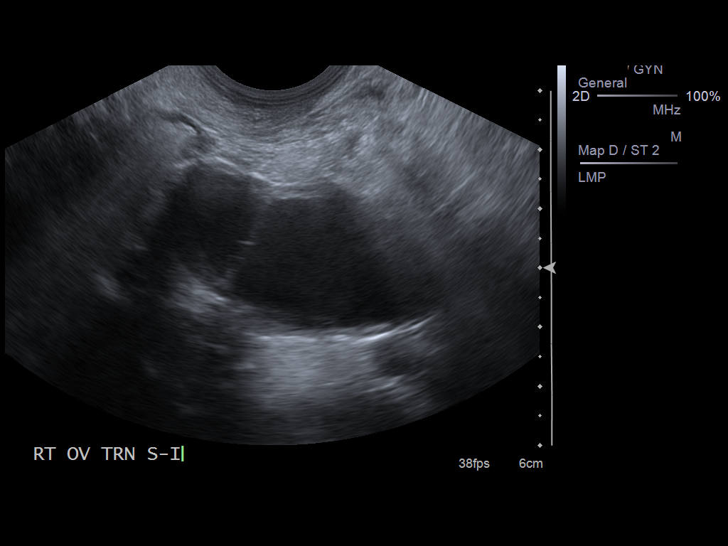
[im 31/37]
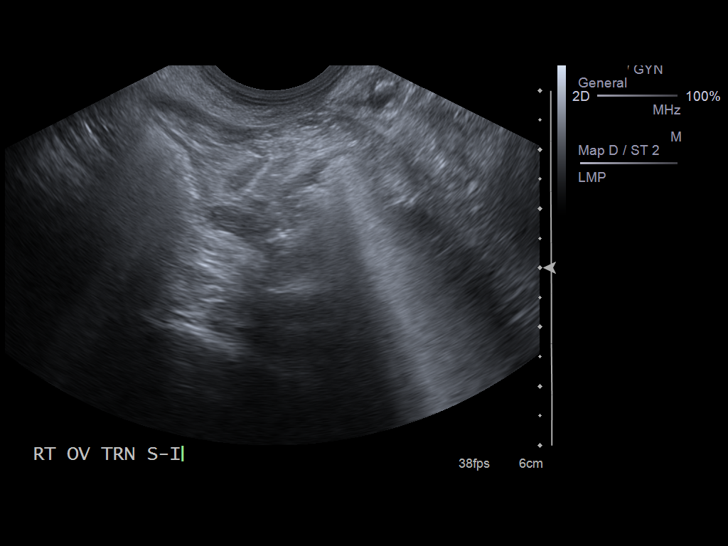
[im 34/37]
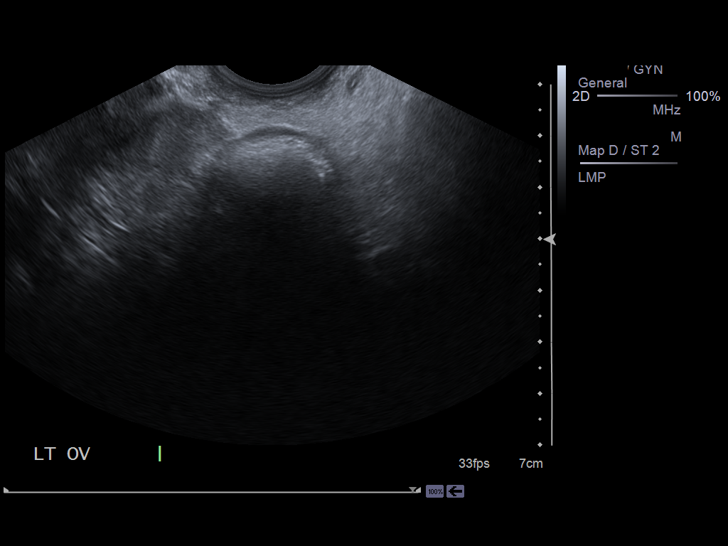
[im 37/37]
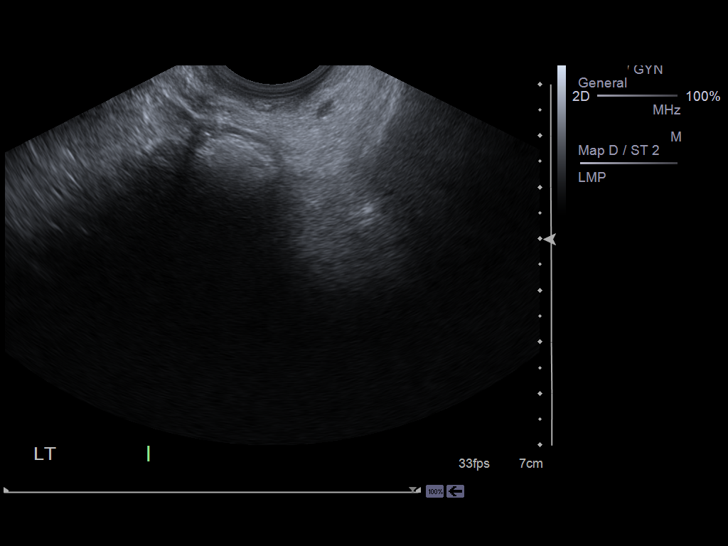

[13 of 25 positions shown; findings below may reference images not displayed]

It was necessary to proceed with endovaginal exam following the
transabdominal exam to visualize the ovaries.
FINDINGS: Uterus: The uterus is not visualized compatible with hysterectomy.

Endometrium: Not visualized compatible with hysterectomy.

Right ovary:  A 4.8 x 4.2 x 4.8 cm complex cyst within the right
ovary/adnexal region is noted with slightly thickened septations.
There is no evidence of mural nodularity or internal vascular flow.

Left ovary: The left ovary is not visualized.

Other findings: No free fluid
IMPRESSION: 4.8 x 4.2 x 4.8 cm right ovarian/adnexal cystic lesion,
indeterminate but likely benign.  Cystic neoplasm is not excluded
and consider surgical evaluation, MRI or short-term interval
ultrasound follow-up (3-6 months).

Status post hysterectomy.

Left ovary not visualized.

## 2012-02-18 ENCOUNTER — Other Ambulatory Visit: Payer: Self-pay | Admitting: Family Medicine

## 2012-02-18 ENCOUNTER — Encounter: Payer: Self-pay | Admitting: Family Medicine

## 2012-02-18 ENCOUNTER — Ambulatory Visit (INDEPENDENT_AMBULATORY_CARE_PROVIDER_SITE_OTHER): Payer: Medicare Other | Admitting: Family Medicine

## 2012-02-18 VITALS — BP 130/78 | HR 76 | Resp 18 | Ht 67.0 in | Wt 174.1 lb

## 2012-02-18 DIAGNOSIS — G4700 Insomnia, unspecified: Secondary | ICD-10-CM

## 2012-02-18 DIAGNOSIS — L259 Unspecified contact dermatitis, unspecified cause: Secondary | ICD-10-CM | POA: Diagnosis not present

## 2012-02-18 DIAGNOSIS — I10 Essential (primary) hypertension: Secondary | ICD-10-CM | POA: Diagnosis not present

## 2012-02-18 DIAGNOSIS — Z139 Encounter for screening, unspecified: Secondary | ICD-10-CM | POA: Diagnosis not present

## 2012-02-18 DIAGNOSIS — F32A Depression, unspecified: Secondary | ICD-10-CM

## 2012-02-18 DIAGNOSIS — F3289 Other specified depressive episodes: Secondary | ICD-10-CM | POA: Diagnosis not present

## 2012-02-18 DIAGNOSIS — F329 Major depressive disorder, single episode, unspecified: Secondary | ICD-10-CM | POA: Diagnosis not present

## 2012-02-18 DIAGNOSIS — L309 Dermatitis, unspecified: Secondary | ICD-10-CM | POA: Insufficient documentation

## 2012-02-18 MED ORDER — SULFAMETHOXAZOLE-TRIMETHOPRIM 800-160 MG PO TABS: 1.0000 | ORAL_TABLET | Freq: Two times a day (BID) | ORAL | Status: DC

## 2012-02-18 MED ORDER — FLUOXETINE HCL 10 MG PO CAPS: 10.0000 mg | ORAL_CAPSULE | Freq: Every day | ORAL | Status: DC

## 2012-02-18 NOTE — Assessment & Plan Note (Signed)
Deteriorated due to mental health issues, will resume fluoxetine

## 2012-02-18 NOTE — Progress Notes (Signed)
  Subjective:    Patient ID: Theresa Garcia, female    DOB: 1940/05/20, 71 y.o.   MRN: [id number]  HPI 2 week h/o pruritic rash on inner thighs between buttocks. Denies dysuria or frequency, no fever or chills. Has not had this problem in the past. Increased and uncontrolled symptoms of depression since mentally challenged sister diagnosed with breast cancer. Has been off meds , willing to resume, not willing to start therapy at this time.   Review of Systems See HPI Denies recent fever or chills. Denies sinus pressure, nasal congestion, ear pain or sore throat. Denies chest congestion, productive cough or wheezing. Denies chest pains, palpitations and leg swelling Denies abdominal pain, nausea, vomiting,diarrhea or constipation.    Denies joint pain, swelling and limitation in mobility. Denies headaches, seizures, numbness, or tingling.  Denies skin break down or rash.        Objective:   Physical Exam  Patient alert and oriented and in no cardiopulmonary distress.  HEENT: No facial asymmetry, EOMI, no sinus tenderness,  oropharynx pink and moist.  Neck supple no adenopathy.  Chest: Clear to auscultation bilaterally.  CVS: S1, S2 no murmurs, no S3.  ABD: Soft non tender. Bowel sounds normal.  Ext: No edema  MS: Adequate ROM spine, shoulders, hips and knees.  Skin: ulceration of inner thighs, area involved approx 2.5 inch diameter Psych: Good eye contact, flat  affect. Memory intact tearful and  depressed appearing.  CNS: CN 2-12 intact, power, tone and sensation normal throughout.       Assessment & Plan:

## 2012-02-18 NOTE — Assessment & Plan Note (Signed)
Resume fluoxetine, behavioral change to begin, therapy recommended, pt refusing at this time

## 2012-02-18 NOTE — Patient Instructions (Addendum)
F/u in 2 month  I am concerned about your health. You need to resume fluoxetine as we discussed, and start taking breaks from sitting with your ill sister , and allow other family members to be involved. This will help her, you ,and the rest of the family.  Antibiotic is sent in where the skin is broken and you have been scratching, so it will heal, you also need to stop scratching.  HSV tye 2 test today

## 2012-02-18 NOTE — Assessment & Plan Note (Signed)
Controlled, no change in medication DASH diet and commitment to daily physical activity for a minimum of 30 minutes discussed and encouraged, as a part of hypertension management. The importance of attaining a healthy weight is also discussed.  

## 2012-02-18 NOTE — Assessment & Plan Note (Signed)
Skin irritation and breakdown on buttocks between thighs, will cover with antibiotic, test for hSV2

## 2012-02-19 ENCOUNTER — Other Ambulatory Visit: Payer: Self-pay

## 2012-02-19 ENCOUNTER — Other Ambulatory Visit: Payer: Self-pay | Admitting: Family Medicine

## 2012-02-19 LAB — HSV 2 ANTIBODY, IGG: HSV 2 Glycoprotein G Ab, IgG: 11.11 IV — ABNORMAL HIGH

## 2012-02-19 MED ORDER — ACYCLOVIR 200 MG PO CAPS: 200.0000 mg | ORAL_CAPSULE | Freq: Every day | ORAL | Status: DC

## 2012-03-04 ENCOUNTER — Other Ambulatory Visit: Payer: Self-pay | Admitting: Family Medicine

## 2012-03-04 ENCOUNTER — Telehealth: Payer: Self-pay | Admitting: Family Medicine

## 2012-03-04 ENCOUNTER — Other Ambulatory Visit: Payer: Self-pay

## 2012-03-04 MED ORDER — ACYCLOVIR 400 MG PO TABS: 400.0000 mg | ORAL_TABLET | Freq: Two times a day (BID) | ORAL | Status: DC

## 2012-03-04 NOTE — Telephone Encounter (Signed)
Tried to get more information but she said that there was no itching or burning but she "felt" like it was still something there in her vagina and she wanted a refill of the med she got last time. She has her sick sister at her home and is looking after her and can't come in

## 2012-03-04 NOTE — Telephone Encounter (Signed)
Acyclovir twice daily ebnterded historically for chronic suppression and will help the stingluing and burning. That is what she needs now. If still having probs will need OV, pls send med after you spk with her

## 2012-03-04 NOTE — Telephone Encounter (Signed)
Med sent.

## 2012-03-08 ENCOUNTER — Telehealth: Payer: Self-pay | Admitting: Family Medicine

## 2012-03-08 NOTE — Telephone Encounter (Signed)
noted 

## 2012-03-10 ENCOUNTER — Ambulatory Visit (INDEPENDENT_AMBULATORY_CARE_PROVIDER_SITE_OTHER): Payer: Medicare Other | Admitting: Family Medicine

## 2012-03-10 ENCOUNTER — Encounter: Payer: Self-pay | Admitting: Family Medicine

## 2012-03-10 VITALS — BP 130/82 | HR 78 | Resp 16 | Wt 176.0 lb

## 2012-03-10 DIAGNOSIS — E559 Vitamin D deficiency, unspecified: Secondary | ICD-10-CM

## 2012-03-10 DIAGNOSIS — A6004 Herpesviral vulvovaginitis: Secondary | ICD-10-CM

## 2012-03-10 DIAGNOSIS — E785 Hyperlipidemia, unspecified: Secondary | ICD-10-CM

## 2012-03-10 DIAGNOSIS — Z0000 Encounter for general adult medical examination without abnormal findings: Secondary | ICD-10-CM

## 2012-03-10 DIAGNOSIS — Z79899 Other long term (current) drug therapy: Secondary | ICD-10-CM

## 2012-03-10 DIAGNOSIS — R5383 Other fatigue: Secondary | ICD-10-CM

## 2012-03-10 DIAGNOSIS — C50919 Malignant neoplasm of unspecified site of unspecified female breast: Secondary | ICD-10-CM | POA: Diagnosis not present

## 2012-03-10 DIAGNOSIS — R5381 Other malaise: Secondary | ICD-10-CM

## 2012-03-10 DIAGNOSIS — I10 Essential (primary) hypertension: Secondary | ICD-10-CM

## 2012-03-10 MED ORDER — TEMAZEPAM 30 MG PO CAPS: 30.0000 mg | ORAL_CAPSULE | Freq: Every evening | ORAL | Status: DC | PRN

## 2012-03-10 NOTE — Progress Notes (Signed)
Subjective:    Patient ID: Theresa Garcia, female    DOB: [birth year], 72 y.o.   MRN: [id number]  HPI Preventive Screening-Counseling & Management   Patient present here today for a Medicare annual wellness visit.   Current Problems (verified)   Medications Prior to Visit Allergies (verified)   PAST HISTORY  Family History  Social History divorced, mother of 1 living one son living, the other deceased recently Never alcohol , quit nicotine over 25 years ago , and did not smoke for more than 2 years Retired from [employer] x 32 years    Risk Factors  Current exercise habits:  Physical activity commitment needed  Dietary issues discussed:low fat, a lot of fruit and bveg   Cardiac risk factors:   Depression Screen  (Note: if answer to either of the following is "Yes", a more complete depression screening is indicated)   Over the past two weeks, have you felt down, depressed or hopeless? No  Over the past two weeks, have you felt little interest or pleasure in doing things? No  Have you lost interest or pleasure in daily life? No  Do you often feel hopeless? No  Do you cry easily over simple problems? No   Activities of Daily Living  In your present state of health, do you have any difficulty performing the following activities?  Driving?: No Managing money?: No Feeding yourself?:No Getting from bed to chair?:No Climbing a flight of stairs?:No Preparing food and eating?:No Bathing or showering?:No Getting dressed?:No Getting to the toilet?:No Using the toilet?:No Moving around from place to place?: No  Fall Risk Assessment In the past year have you fallen or had a near fall?:No Are you currently taking any medications that make you dizziness?:No   Hearing Difficulties: No Do you often ask people to speak up or repeat themselves?:No Do you experience ringing or noises in your ears?:No Do you have difficulty understanding soft or whispered voices?:No  Cognitive  Testing  Alert? Yes Normal Appearance?Yes  Oriented to person? Yes Place? Yes  Time? Yes  Displays appropriate judgment?Yes  Can read the correct time from a watch face? yes Are you having problems remembering things?No  Advanced Directives have been discussed with the patient?Yes , full code   List the Names of Other Physician/Practitioners you currently use: needs onc f/u in [place], , Dr Eduard Clos   Indicate any recent Medical Services you may have received from other than [facility] providers in the past year (date may be approximate).   Assessment:    Annual Wellness Exam   Plan:    During the course of the visit the patient was educated and counseled about appropriate screening and preventive services including:  A healthy diet is rich in fruit, vegetables and whole grains. Poultry fish, nuts and beans are a healthy choice for protein rather then red meat. A low sodium diet and drinking 64 ounces of water daily is generally recommended. Oils and sweet should be limited. Carbohydrates especially for those who are diabetic or overweight, should be limited to 30-45 gram per meal. It is important to eat on a regular schedule, at least 3 times daily. Snacks should be primarily fruits, vegetables or nuts. It is important that you exercise regularly at least 30 minutes 5 times a week. If you develop chest pain, have severe difficulty breathing, or feel very tired, stop exercising immediately and seek medical attention  Immunization reviewed and updated. Cancer screening reviewed and updated    Patient Instructions (the written  plan) was given to the patient.  Medicare Attestation  I have personally reviewed:  The patient's medical and social history  Their use of alcohol, tobacco or illicit drugs  Their current medications and supplements  The patient's functional ability including ADLs,fall risks, home safety risks, cognitive, and hearing and visual impairment  Diet and physical activities    Evidence for depression or mood disorders  The patient's weight, height, BMI, and visual acuity have been recorded in the chart. I have made referrals, counseling, and provided education to the patient based on review of the above and I have provided the patient with a written personalized care plan for preventive services.     Review of Systems     Objective:   Physical Exam        Assessment & Plan:

## 2012-03-10 NOTE — Patient Instructions (Addendum)
F/u in 4 month  I am happy you are doing better.  Please call if you need me before.  Fasting lipid, cmp, TSH, vit D, cbc past due please get in the next 1 to 2 weeks  Please start regular physical activity, his will help with your health  You will be referred to oncology re breast cancer hsitory, in [place]

## 2012-03-12 DIAGNOSIS — A6004 Herpesviral vulvovaginitis: Secondary | ICD-10-CM | POA: Insufficient documentation

## 2012-03-12 DIAGNOSIS — Z0000 Encounter for general adult medical examination without abnormal findings: Secondary | ICD-10-CM | POA: Insufficient documentation

## 2012-03-12 NOTE — Assessment & Plan Note (Signed)
Annual wellness exam completed as documented. Pt referred to oncology to f/u locally, past h/o breast cancer Needs to commit to regularr phsyical activity for mental and physical wellbeing

## 2012-03-24 ENCOUNTER — Telehealth: Payer: Self-pay | Admitting: Family Medicine

## 2012-03-24 DIAGNOSIS — E785 Hyperlipidemia, unspecified: Secondary | ICD-10-CM | POA: Diagnosis not present

## 2012-03-24 DIAGNOSIS — I10 Essential (primary) hypertension: Secondary | ICD-10-CM | POA: Diagnosis not present

## 2012-03-24 DIAGNOSIS — C50919 Malignant neoplasm of unspecified site of unspecified female breast: Secondary | ICD-10-CM | POA: Diagnosis not present

## 2012-03-24 NOTE — Telephone Encounter (Signed)
If any questions please call [phone] option 1

## 2012-03-30 DIAGNOSIS — Z1231 Encounter for screening mammogram for malignant neoplasm of breast: Secondary | ICD-10-CM | POA: Diagnosis not present

## 2012-05-18 ENCOUNTER — Other Ambulatory Visit: Payer: Self-pay | Admitting: Family Medicine

## 2012-07-11 ENCOUNTER — Ambulatory Visit (INDEPENDENT_AMBULATORY_CARE_PROVIDER_SITE_OTHER): Payer: Medicare Other | Admitting: Family Medicine

## 2012-07-11 ENCOUNTER — Encounter: Payer: Self-pay | Admitting: Family Medicine

## 2012-07-11 VITALS — BP 112/80 | HR 83 | Resp 16 | Ht 67.0 in | Wt 180.8 lb

## 2012-07-11 DIAGNOSIS — I10 Essential (primary) hypertension: Secondary | ICD-10-CM

## 2012-07-11 DIAGNOSIS — J309 Allergic rhinitis, unspecified: Secondary | ICD-10-CM

## 2012-07-11 DIAGNOSIS — F3289 Other specified depressive episodes: Secondary | ICD-10-CM

## 2012-07-11 DIAGNOSIS — R5383 Other fatigue: Secondary | ICD-10-CM

## 2012-07-11 DIAGNOSIS — F32A Depression, unspecified: Secondary | ICD-10-CM

## 2012-07-11 DIAGNOSIS — E785 Hyperlipidemia, unspecified: Secondary | ICD-10-CM

## 2012-07-11 DIAGNOSIS — R5381 Other malaise: Secondary | ICD-10-CM | POA: Diagnosis not present

## 2012-07-11 DIAGNOSIS — F329 Major depressive disorder, single episode, unspecified: Secondary | ICD-10-CM

## 2012-07-11 DIAGNOSIS — G4700 Insomnia, unspecified: Secondary | ICD-10-CM

## 2012-07-11 MED ORDER — DICLOFENAC-MISOPROSTOL 75-0.2 MG PO TBEC: 1.0000 | DELAYED_RELEASE_TABLET | Freq: Every day | ORAL | Status: DC

## 2012-07-11 MED ORDER — AMLODIPINE BESY-BENAZEPRIL HCL 5-40 MG PO CAPS: ORAL_CAPSULE | ORAL | Status: DC

## 2012-07-11 MED ORDER — LORATADINE 10 MG PO TABS: 10.0000 mg | ORAL_TABLET | Freq: Every day | ORAL | Status: DC

## 2012-07-11 MED ORDER — ATORVASTATIN CALCIUM 20 MG PO TABS: ORAL_TABLET | ORAL | Status: DC

## 2012-07-11 NOTE — Assessment & Plan Note (Signed)
Sleep hygiene reviewed, continue medication

## 2012-07-11 NOTE — Patient Instructions (Addendum)
Annual wellness in 6 month, call if you need me before  I am glad that you are doing well.  Fasting lipid, cmp, cbc, tsh this week please   Please check [facility], [place] for shingles vaccine, you need this, one time only  [facility]or[facility]idine, (generic [facility]) [facility].

## 2012-07-11 NOTE — Assessment & Plan Note (Signed)
Controlled, no change in medication DASH diet and commitment to daily physical activity for a minimum of 30 minutes discussed and encouraged, as a part of hypertension management. The importance of attaining a healthy weight is also discussed.  

## 2012-07-11 NOTE — Assessment & Plan Note (Signed)
uncontrolled in past 6 weeks, medication prescribed

## 2012-07-11 NOTE — Assessment & Plan Note (Signed)
Improved, continue current meds 

## 2012-07-11 NOTE — Progress Notes (Signed)
  Subjective:    Patient ID: Theresa Garcia, female    DOB: Jul 05, 1940, 72 y.o.   MRN: [id number]  HPI  The PT is here for follow up and re-evaluation of chronic medical conditions, medication management and review of any available recent lab and radiology data.  Preventive health is updated, specifically  Cancer screening and Immunization.   Questions or concerns regarding consultations or procedures which the PT has had in the interim are  addressed. The PT denies any adverse reactions to current medications since the last visit.  Increased sinus pressure and nasal congestion with clear drainage in the past 4 to 6 weeks, no fever chills or green drainage. Recently having increased calls from her ex spouse , somewhat troubling to her , I advised her not to answer      Review of Systems See HPI Denies recent fever or chills. Denies , ear pain or sore throat. Denies chest congestion, productive cough or wheezing. Denies chest pains, palpitations and leg swelling Denies abdominal pain, nausea, vomiting,diarrhea or constipation.   Denies dysuria, frequency, hesitancy or incontinence. Denies joint pain, swelling and limitation in mobility. Denies headaches, seizures, numbness, or tingling. Denies depression, anxiety or insomnia. Denies skin break down or rash.        Objective:   Physical Exam   Patient alert and oriented and in no cardiopulmonary distress.  HEENT: No facial asymmetry, EOMI, no sinus tenderness,  oropharynx pink and moist.  Neck supple no adenopathy.  Chest: Clear to auscultation bilaterally.  CVS: S1, S2 no murmurs, no S3.  ABD: Soft non tender. Bowel sounds normal.  Ext: No edema  MS: Adequate ROM spine, shoulders, hips and knees.  Skin: Intact, no ulcerations or rash noted.  Psych: Good eye contact, normal affect. Memory intact not anxious or depressed appearing.  CNS: CN 2-12 intact, power, tone and sensation normal throughout.       Assessment & Plan:

## 2012-07-15 DIAGNOSIS — R5381 Other malaise: Secondary | ICD-10-CM | POA: Diagnosis not present

## 2012-07-15 DIAGNOSIS — I10 Essential (primary) hypertension: Secondary | ICD-10-CM | POA: Diagnosis not present

## 2012-07-15 DIAGNOSIS — E785 Hyperlipidemia, unspecified: Secondary | ICD-10-CM | POA: Diagnosis not present

## 2012-07-15 LAB — LIPID PANEL
Cholesterol: 173 mg/dL (ref 0–200)
HDL: 51 mg/dL (ref >39)
LDL Cholesterol: 103 mg/dL — ABNORMAL HIGH (ref 0–99)
Total CHOL/HDL Ratio: 3.4 Ratio
Triglycerides: 95 mg/dL (ref <150)
VLDL: 19 mg/dL (ref 0–40)

## 2012-07-15 LAB — CBC WITH DIFFERENTIAL/PLATELET
Basophils Absolute: 0 10*3/uL (ref 0.0–0.1)
Basophils Relative: 0 % (ref 0–1)
Eosinophils Absolute: 0.2 10*3/uL (ref 0.0–0.7)
Eosinophils Relative: 2 % (ref 0–5)
HCT: 39.9 % (ref 36.0–46.0)
Hemoglobin: 13.9 g/dL (ref 12.0–15.0)
Lymphocytes Relative: 32 % (ref 12–46)
Lymphs Abs: 2.3 10*3/uL (ref 0.7–4.0)
MCH: 31.4 pg (ref 26.0–34.0)
MCHC: 34.8 g/dL (ref 30.0–36.0)
MCV: 90.3 fL (ref 78.0–100.0)
Monocytes Absolute: 0.5 10*3/uL (ref 0.1–1.0)
Monocytes Relative: 7 % (ref 3–12)
Neutro Abs: 4.3 10*3/uL (ref 1.7–7.7)
Neutrophils Relative %: 59 % (ref 43–77)
Platelets: 267 10*3/uL (ref 150–400)
RBC: 4.42 MIL/uL (ref 3.87–5.11)
RDW: 14.5 % (ref 11.5–15.5)
WBC: 7.2 10*3/uL (ref 4.0–10.5)

## 2012-07-15 LAB — COMPREHENSIVE METABOLIC PANEL
ALT: 12 U/L (ref 0–35)
AST: 18 U/L (ref 0–37)
Albumin: 4.1 g/dL (ref 3.5–5.2)
Alkaline Phosphatase: 55 U/L (ref 39–117)
BUN: 12 mg/dL (ref 6–23)
CO2: 21 mEq/L (ref 19–32)
Calcium: 9.8 mg/dL (ref 8.4–10.5)
Chloride: 108 mEq/L (ref 96–112)
Creat: 0.8 mg/dL (ref 0.50–1.10)
Glucose, Bld: 87 mg/dL (ref 70–99)
Potassium: 4.3 mEq/L (ref 3.5–5.3)
Sodium: 140 mEq/L (ref 135–145)
Total Bilirubin: 0.5 mg/dL (ref 0.3–1.2)
Total Protein: 7 g/dL (ref 6.0–8.3)

## 2012-07-15 LAB — TSH: TSH: 0.942 u[IU]/mL (ref 0.350–4.500)

## 2012-09-13 ENCOUNTER — Other Ambulatory Visit: Payer: Self-pay

## 2012-09-13 ENCOUNTER — Telehealth: Payer: Self-pay | Admitting: Family Medicine

## 2012-09-13 MED ORDER — TEMAZEPAM 30 MG PO CAPS: 30.0000 mg | ORAL_CAPSULE | Freq: Every evening | ORAL | Status: DC | PRN

## 2012-09-13 NOTE — Telephone Encounter (Signed)
Noted and sorry to hear

## 2012-09-13 NOTE — Telephone Encounter (Signed)
Med refilled.

## 2012-10-05 ENCOUNTER — Other Ambulatory Visit: Payer: Self-pay | Admitting: Family Medicine

## 2012-10-06 NOTE — Telephone Encounter (Signed)
Reminder letter sent to call and schedule annual wellness visit for November.

## 2012-10-10 ENCOUNTER — Encounter: Payer: Self-pay | Admitting: Family Medicine

## 2012-10-10 ENCOUNTER — Ambulatory Visit (INDEPENDENT_AMBULATORY_CARE_PROVIDER_SITE_OTHER): Payer: Medicare Other | Admitting: Family Medicine

## 2012-10-10 VITALS — BP 128/80 | HR 83 | Resp 16 | Wt 182.0 lb

## 2012-10-10 DIAGNOSIS — Z1239 Encounter for other screening for malignant neoplasm of breast: Secondary | ICD-10-CM | POA: Diagnosis not present

## 2012-10-10 DIAGNOSIS — Z1211 Encounter for screening for malignant neoplasm of colon: Secondary | ICD-10-CM | POA: Diagnosis not present

## 2012-10-10 DIAGNOSIS — E785 Hyperlipidemia, unspecified: Secondary | ICD-10-CM | POA: Diagnosis not present

## 2012-10-10 DIAGNOSIS — G4700 Insomnia, unspecified: Secondary | ICD-10-CM

## 2012-10-10 DIAGNOSIS — A6004 Herpesviral vulvovaginitis: Secondary | ICD-10-CM

## 2012-10-10 DIAGNOSIS — F329 Major depressive disorder, single episode, unspecified: Secondary | ICD-10-CM

## 2012-10-10 DIAGNOSIS — I10 Essential (primary) hypertension: Secondary | ICD-10-CM

## 2012-10-10 DIAGNOSIS — F32A Depression, unspecified: Secondary | ICD-10-CM

## 2012-10-10 DIAGNOSIS — F3289 Other specified depressive episodes: Secondary | ICD-10-CM

## 2012-10-10 LAB — HEMOCCULT GUIAC POC 1CARD (OFFICE): Fecal Occult Blood, POC: NEGATIVE

## 2012-10-10 NOTE — Assessment & Plan Note (Addendum)
Controlled, no change in medication Hyperlipidemia:Low fat diet discussed and encouraged. Updated lab in November

## 2012-10-10 NOTE — Assessment & Plan Note (Signed)
Sleep hygiene reviewed, continue current medication

## 2012-10-10 NOTE — Assessment & Plan Note (Signed)
Improved and controlled on current medication 

## 2012-10-10 NOTE — Assessment & Plan Note (Signed)
Remains on low dose zovirax, no acute flare

## 2012-10-10 NOTE — Progress Notes (Signed)
  Subjective:    Patient ID: Theresa Garcia, female    DOB: [birth year], 72 y.o.   MRN: [id number]  HPI The PT is here for follow up and re-evaluation of chronic medical conditions, medication management and review of any available recent lab and radiology data.  Preventive health is updated, specifically  Cancer screening and Immunization.  Mammogram is UTD , done in February Will need colonoscopy next year. The PT denies any adverse reactions to current medications since the last visit.  Two siblings are ill, sister has breast cancer and needed to be hospitalized due to heart failure, back home now and getting medication weekly IV for her cancer. Her brother recently diagnosed with prostate cancer involving the bone , so she has to be dealing with this also No longer walking as she used to , and misses it, states needs to stay with her sisiter. Unsure of last colonoscopy      Review of Systems See HPI Denies recent fever or chills. Denies sinus pressure, nasal congestion, ear pain or sore throat. Denies chest congestion, productive cough or wheezing. Denies chest pains, palpitations and leg swelling Denies abdominal pain, nausea, vomiting,diarrhea or constipation.   Denies dysuria, frequency, hesitancy or incontinence. Denies joint pain, swelling and limitation in mobility. Denies headaches, seizures, numbness, or tingling. Denies depression, anxiety or insomnia. Denies skin break down or rash.        Objective:   Physical Exam  Patient alert and oriented and in no cardiopulmonary distress.  HEENT: No facial asymmetry, EOMI, no sinus tenderness,  oropharynx pink and moist.  Neck supple no adenopathy.  Chest: Clear to auscultation bilaterally.  CVS: S1, S2 no murmurs, no S3.  ABD: Soft non tender. Bowel sounds normal. Rectal no mass, heme negative stool Ext: No edema  MS: Adequate ROM spine, shoulders, hips and knees.  Skin: Intact, no ulcerations or rash  noted.  Psych: Good eye contact, normal affect. Memory intact not anxious or depressed appearing.  CNS: CN 2-12 intact, power, tone and sensation normal throughout.       Assessment & Plan:

## 2012-10-10 NOTE — Assessment & Plan Note (Signed)
Controlled, no change in medication DASH diet and commitment to daily physical activity for a minimum of 30 minutes discussed and encouraged, as a part of hypertension management. The importance of attaining a healthy weight is also discussed.  

## 2012-10-10 NOTE — Patient Instructions (Addendum)
F/u mid January, call if you need me before  Call for flu vaccine in October.  Pneumonia vaccine today.  [facility] check [facility], [place] [facility] [place] [facility] , any of these pharmacies have the shingles  vaccine and you need this  Fasting lipid and cmp in October.  Plan on a colonoscopy next year.  Rectal exam today  Start 30 minutes physical activity every day please, you can do this in your home

## 2012-10-14 ENCOUNTER — Other Ambulatory Visit: Payer: Self-pay | Admitting: Family Medicine

## 2012-10-14 ENCOUNTER — Telehealth: Payer: Self-pay | Admitting: Family Medicine

## 2012-10-14 MED ORDER — MECLIZINE HCL 12.5 MG PO TABS: 12.5000 mg | ORAL_TABLET | Freq: Three times a day (TID) | ORAL | Status: DC | PRN

## 2012-10-14 NOTE — Telephone Encounter (Signed)
meds sent to the pharmacy, pls advise her to be careful with moving around, limit as much as possible till her symptoms improve. Has she had this problem before?pls document a response. Meds sent in should improve her symptoms in the next 3 days , pls explain this to her also

## 2012-10-14 NOTE — Telephone Encounter (Signed)
noted 

## 2012-10-14 NOTE — Telephone Encounter (Signed)
Called back phone line busy

## 2012-10-14 NOTE — Telephone Encounter (Signed)
Patient states has had this problem before a long time ago and she will limit her activity until she feels better and thank you so much for the meds

## 2012-11-28 ENCOUNTER — Ambulatory Visit (INDEPENDENT_AMBULATORY_CARE_PROVIDER_SITE_OTHER): Payer: Medicare Other

## 2012-11-28 DIAGNOSIS — Z23 Encounter for immunization: Secondary | ICD-10-CM

## 2012-12-16 ENCOUNTER — Other Ambulatory Visit: Payer: Self-pay | Admitting: Family Medicine

## 2012-12-20 ENCOUNTER — Other Ambulatory Visit: Payer: Self-pay | Admitting: Family Medicine

## 2012-12-21 ENCOUNTER — Telehealth: Payer: Self-pay | Admitting: Family Medicine

## 2012-12-21 ENCOUNTER — Other Ambulatory Visit: Payer: Self-pay

## 2012-12-21 MED ORDER — DICLOFENAC-MISOPROSTOL 75-0.2 MG PO TBEC: 1.0000 | DELAYED_RELEASE_TABLET | Freq: Every day | ORAL | Status: DC

## 2012-12-21 NOTE — Telephone Encounter (Signed)
[facility]

## 2013-01-02 ENCOUNTER — Telehealth: Payer: Self-pay | Admitting: Family Medicine

## 2013-01-03 ENCOUNTER — Encounter: Payer: Self-pay | Admitting: Family Medicine

## 2013-01-03 ENCOUNTER — Ambulatory Visit (INDEPENDENT_AMBULATORY_CARE_PROVIDER_SITE_OTHER): Payer: Medicare Other | Admitting: Family Medicine

## 2013-01-03 ENCOUNTER — Encounter (INDEPENDENT_AMBULATORY_CARE_PROVIDER_SITE_OTHER): Payer: Self-pay

## 2013-01-03 VITALS — BP 114/74 | HR 88 | Temp 98.7°F | Resp 16 | Ht 67.0 in | Wt 185.0 lb

## 2013-01-03 DIAGNOSIS — J0110 Acute frontal sinusitis, unspecified: Secondary | ICD-10-CM | POA: Diagnosis not present

## 2013-01-03 DIAGNOSIS — F329 Major depressive disorder, single episode, unspecified: Secondary | ICD-10-CM

## 2013-01-03 DIAGNOSIS — I10 Essential (primary) hypertension: Secondary | ICD-10-CM | POA: Diagnosis not present

## 2013-01-03 DIAGNOSIS — J209 Acute bronchitis, unspecified: Secondary | ICD-10-CM

## 2013-01-03 DIAGNOSIS — J040 Acute laryngitis: Secondary | ICD-10-CM

## 2013-01-03 DIAGNOSIS — B9689 Other specified bacterial agents as the cause of diseases classified elsewhere: Secondary | ICD-10-CM | POA: Insufficient documentation

## 2013-01-03 DIAGNOSIS — A499 Bacterial infection, unspecified: Secondary | ICD-10-CM

## 2013-01-03 DIAGNOSIS — F32A Depression, unspecified: Secondary | ICD-10-CM

## 2013-01-03 DIAGNOSIS — F3289 Other specified depressive episodes: Secondary | ICD-10-CM

## 2013-01-03 MED ORDER — METHYLPREDNISOLONE ACETATE 80 MG/ML IJ SUSP
80.0000 mg | Freq: Once | INTRAMUSCULAR | Status: AC
  Administered 2013-01-03: 80 mg via INTRAMUSCULAR

## 2013-01-03 MED ORDER — DOXYCYCLINE HYCLATE 100 MG PO TABS: 100.0000 mg | ORAL_TABLET | Freq: Two times a day (BID) | ORAL | Status: AC

## 2013-01-03 MED ORDER — DOXYCYCLINE HYCLATE 100 MG PO CAPS: 100.0000 mg | ORAL_CAPSULE | Freq: Two times a day (BID) | ORAL | Status: DC

## 2013-01-03 MED ORDER — PREDNISONE 5 MG PO TABS: 5.0000 mg | ORAL_TABLET | Freq: Two times a day (BID) | ORAL | Status: AC

## 2013-01-03 MED ORDER — PROMETHAZINE-DM 6.25-15 MG/5ML PO SYRP: ORAL_SOLUTION | ORAL | Status: DC

## 2013-01-03 MED ORDER — BENZONATATE 100 MG PO CAPS: ORAL_CAPSULE | ORAL | Status: DC

## 2013-01-03 NOTE — Patient Instructions (Signed)
F/u as before. Call if you need me sooner. Depo medrol 80mg  Im in office today You are treated for acute sinusitis and bronchitis.Also for laryngitis 1 week course of antibiotic is prescribed , decongestant medication , tessalon perles,, prednisone for 5 days , and cough suppressant syrup at bedtime  Call if no better or getting worse in the next 2 to 3 days, I will order a CXR and sputum for c/s then  Voice rest, salt water gargles and honey and lime to soothe the throat is also recommended

## 2013-01-03 NOTE — Progress Notes (Signed)
  Subjective:    Patient ID: CASANDRA DALLAIRE, female    DOB: 05-27-1940, 72 y.o.   MRN: [id number]  HPI  3 day h/o cough with yellow mucus , post nasal drainage and chills, no documented fever. Loss of voice, generalized myalgias , fatigue, head and chest pain due to congestion and excessive cough Prior to this had been well  Review of Systems See HPI  Denies chest pains, palpitations and leg swelling Denies abdominal pain, nausea, vomiting,diarrhea or constipation.   Denies dysuria, frequency, hesitancy or incontinence. Denies joint pain, swelling and limitation in mobility. Denies headaches, seizures, numbness, or tingling. Denies depression, anxiety or insomnia. Denies skin break down or rash.        Objective:   Physical Exam  Patient alert and oriented and in no cardiopulmonary distress.Ill appearing  HEENT: No facial asymmetry, EOMI, frontal  sinus tenderness,  oropharynx pink and moist.  Neck supple anterior cervical adenopathy.normal TM, nasal mucosa erythematous and edematous  Chest: decreased though adequate air entry, scattered crackles no wheezes  CVS: S1, S2 no murmurs, no S3.  ABD: Soft non tender. Bowel sounds normal.  Ext: No edema  MS: Adequate ROM spine, shoulders, hips and knees.  Skin: Intact, no ulcerations or rash noted.  Psych: Good eye contact, normal affect. Memory intact not anxious or depressed appearing.  CNS: CN 2-12 intact, power, tone and sensation normal throughout.       Assessment & Plan:

## 2013-01-03 NOTE — Telephone Encounter (Signed)
Coming today at 3:15 for eval

## 2013-01-08 DIAGNOSIS — J0110 Acute frontal sinusitis, unspecified: Secondary | ICD-10-CM | POA: Insufficient documentation

## 2013-01-08 DIAGNOSIS — J040 Acute laryngitis: Secondary | ICD-10-CM | POA: Insufficient documentation

## 2013-01-08 NOTE — Assessment & Plan Note (Signed)
Antibiotic , decongestant and strteroids

## 2013-01-08 NOTE — Assessment & Plan Note (Signed)
Improved and controlled on medication 

## 2013-01-08 NOTE — Assessment & Plan Note (Signed)
Voice rest, salt water gargles and steroids

## 2013-01-08 NOTE — Assessment & Plan Note (Signed)
Antibiotic, decongestant , and steroid injection  Also orally

## 2013-01-08 NOTE — Assessment & Plan Note (Signed)
Controlled, no change in medication  

## 2013-01-21 ENCOUNTER — Other Ambulatory Visit: Payer: Self-pay | Admitting: Family Medicine

## 2013-01-23 ENCOUNTER — Other Ambulatory Visit: Payer: Self-pay | Admitting: Family Medicine

## 2013-01-23 ENCOUNTER — Telehealth: Payer: Self-pay | Admitting: Family Medicine

## 2013-01-23 DIAGNOSIS — J209 Acute bronchitis, unspecified: Secondary | ICD-10-CM

## 2013-01-23 MED ORDER — PROMETHAZINE-DM 6.25-15 MG/5ML PO SYRP: ORAL_SOLUTION | ORAL | Status: DC

## 2013-01-23 MED ORDER — LEVOFLOXACIN 500 MG PO TABS: 500.0000 mg | ORAL_TABLET | Freq: Every day | ORAL | Status: DC

## 2013-01-23 NOTE — Telephone Encounter (Signed)
C/o persistent cough andpost nasal drainage , sputu is thisck and cream, levaquin x 10 days, phenergan dm are and CXr ordered, she is aware

## 2013-01-24 ENCOUNTER — Telehealth: Payer: Self-pay | Admitting: Family Medicine

## 2013-01-24 ENCOUNTER — Ambulatory Visit (HOSPITAL_COMMUNITY)
Admission: RE | Admit: 2013-01-24 | Discharge: 2013-01-24 | Disposition: A | Discharge status: Home / Self Care | Payer: Medicare Other | Source: Ambulatory Visit | Attending: Family Medicine | Admitting: Family Medicine

## 2013-01-24 DIAGNOSIS — E785 Hyperlipidemia, unspecified: Secondary | ICD-10-CM | POA: Diagnosis not present

## 2013-01-24 DIAGNOSIS — J9819 Other pulmonary collapse: Secondary | ICD-10-CM | POA: Diagnosis not present

## 2013-01-24 DIAGNOSIS — I10 Essential (primary) hypertension: Secondary | ICD-10-CM | POA: Diagnosis not present

## 2013-01-24 DIAGNOSIS — Z853 Personal history of malignant neoplasm of breast: Secondary | ICD-10-CM | POA: Diagnosis not present

## 2013-01-24 DIAGNOSIS — J209 Acute bronchitis, unspecified: Secondary | ICD-10-CM | POA: Insufficient documentation

## 2013-01-24 MED ORDER — LEVOFLOXACIN 500 MG PO TABS: 500.0000 mg | ORAL_TABLET | Freq: Every day | ORAL | Status: AC

## 2013-01-24 MED ORDER — PROMETHAZINE-DM 6.25-15 MG/5ML PO SYRP: ORAL_SOLUTION | ORAL | Status: AC

## 2013-01-25 NOTE — Telephone Encounter (Signed)
Patient aware and stated her meds were ready to be collected from [facility]- she received a call

## 2013-01-25 NOTE — Telephone Encounter (Signed)
The meds were sent to Mail order by accident. Resent meds to her local pharmacy and called mail order and cancelled them there.

## 2013-02-08 ENCOUNTER — Telehealth: Payer: Self-pay

## 2013-02-08 DIAGNOSIS — R5381 Other malaise: Secondary | ICD-10-CM

## 2013-02-08 DIAGNOSIS — R5383 Other fatigue: Secondary | ICD-10-CM

## 2013-02-08 NOTE — Telephone Encounter (Signed)
Patient aware and labs [facility] to be done tomorrow

## 2013-02-08 NOTE — Telephone Encounter (Signed)
I suggest 1 MVI daily, also order cbc and diff and chem 7 and TSH due to complaint, she needs to get this no later than tomorrow pls explain

## 2013-02-08 NOTE — Addendum Note (Signed)
Addended by: Abner Greenspan on: 02/08/2013 04:32 PM   Modules accepted: Orders

## 2013-02-10 ENCOUNTER — Telehealth: Payer: Self-pay | Admitting: Family Medicine

## 2013-02-10 MED ORDER — ACYCLOVIR 400 MG PO TABS: ORAL_TABLET | ORAL | Status: DC

## 2013-02-10 NOTE — Telephone Encounter (Signed)
rx sent in to [facility] as requested

## 2013-02-27 DIAGNOSIS — R5381 Other malaise: Secondary | ICD-10-CM | POA: Diagnosis not present

## 2013-02-27 LAB — CBC WITH DIFFERENTIAL/PLATELET
Basophils Absolute: 0 10*3/uL (ref 0.0–0.1)
Basophils Relative: 0 % (ref 0–1)
Eosinophils Absolute: 0.1 10*3/uL (ref 0.0–0.7)
Eosinophils Relative: 1 % (ref 0–5)
HCT: 40.3 % (ref 36.0–46.0)
Hemoglobin: 13.9 g/dL (ref 12.0–15.0)
Lymphocytes Relative: 33 % (ref 12–46)
Lymphs Abs: 2.5 10*3/uL (ref 0.7–4.0)
MCH: 31.8 pg (ref 26.0–34.0)
MCHC: 34.5 g/dL (ref 30.0–36.0)
MCV: 92.2 fL (ref 78.0–100.0)
Monocytes Absolute: 0.5 10*3/uL (ref 0.1–1.0)
Monocytes Relative: 7 % (ref 3–12)
Neutro Abs: 4.6 10*3/uL (ref 1.7–7.7)
Neutrophils Relative %: 59 % (ref 43–77)
Platelets: 292 10*3/uL (ref 150–400)
RBC: 4.37 MIL/uL (ref 3.87–5.11)
RDW: 14 % (ref 11.5–15.5)
WBC: 7.8 10*3/uL (ref 4.0–10.5)

## 2013-02-27 LAB — BASIC METABOLIC PANEL
BUN: 10 mg/dL (ref 6–23)
CO2: 25 mEq/L (ref 19–32)
Calcium: 9.6 mg/dL (ref 8.4–10.5)
Chloride: 107 mEq/L (ref 96–112)
Creat: 0.71 mg/dL (ref 0.50–1.10)
Glucose, Bld: 85 mg/dL (ref 70–99)
Potassium: 4.2 mEq/L (ref 3.5–5.3)
Sodium: 139 mEq/L (ref 135–145)

## 2013-02-28 LAB — TSH: TSH: 0.827 u[IU]/mL (ref 0.350–4.500)

## 2013-03-13 ENCOUNTER — Encounter: Payer: Self-pay | Admitting: Family Medicine

## 2013-03-13 ENCOUNTER — Encounter (INDEPENDENT_AMBULATORY_CARE_PROVIDER_SITE_OTHER): Payer: Self-pay

## 2013-03-13 ENCOUNTER — Ambulatory Visit (INDEPENDENT_AMBULATORY_CARE_PROVIDER_SITE_OTHER): Payer: Medicare Other | Admitting: Family Medicine

## 2013-03-13 VITALS — BP 160/100 | HR 82 | Resp 18 | Ht 67.0 in | Wt 186.1 lb

## 2013-03-13 DIAGNOSIS — E785 Hyperlipidemia, unspecified: Secondary | ICD-10-CM

## 2013-03-13 DIAGNOSIS — I10 Essential (primary) hypertension: Secondary | ICD-10-CM

## 2013-03-13 DIAGNOSIS — Z0000 Encounter for general adult medical examination without abnormal findings: Secondary | ICD-10-CM | POA: Diagnosis not present

## 2013-03-13 NOTE — Patient Instructions (Addendum)
F/u in 6 month, call if you need me before  You will be referred for a mammogram if needed, also for colonscopy by the Summer.   PLEASE remember to take medication every day, BP is high today and you say you forgot to take the medication   Fasting lipid and cmp in 6 month, before visit  It is important that you exercise regularly at least 30 minutes 5 times a week. If you develop chest pain, have severe difficulty breathing, or feel very tired, stop exercising immediately and seek medical attention   A healthy diet is rich in fruit, vegetables and whole grains. Poultry fish, nuts and beans are a healthy choice for protein rather then red meat. A low sodium diet and drinking 64 ounces of water daily is generally recommended. Oils and sweet should be limited. Carbohydrates especially for those who are diabetic or overweight, should be limited to 60 -45 gram per meal. It is important to eat on a regular schedule, at least 3 times daily. Snacks should be primarily fruits, vegetables or nuts.  Weight loss goal of 5 pounds in 6 month

## 2013-03-13 NOTE — Progress Notes (Signed)
Subjective:    Patient ID: Theresa Garcia, female    DOB: Oct 31, 1940, 73 y.o.   MRN: [id number]  HPI  Preventive Screening-Counseling & Management   Patient present here today for a Medicare annual wellness visit.   Current Problems (verified)   Medications Prior to Visit Allergies (verified)   PAST HISTORY  Family History: see note  Social History  Divorced Mom of 1 living son, 3 grandchildren (step0. , 4 oz red wine daily at dinner, no street drugs, nicotine  In teens for short time   Risk Factors  Current exercise habits: None , will work on changing this , she needs to  Dietary issues discussed:inc in veg, fruit , fresh and frozen, water, grains and high fiber intake, white meat , beans   Cardiac risk factors: Sibling who had CAD first dx in his 54's  Depression Screen  (Note: if answer to either of the following is "Yes", a more complete depression screening is indicated)   Over the past two weeks, have you felt down, depressed or hopeless? No  Over the past two weeks, have you felt little interest or pleasure in doing things? No  Have you lost interest or pleasure in daily life? No  Do you often feel hopeless? No  Do you cry easily over simple problems? No   Activities of Daily Living  In your present state of health, do you have any difficulty performing the following activities?  Driving?: No Managing money?: No Feeding yourself?:No Getting from bed to chair?:No Climbing a flight of stairs?:No Preparing food and eating?:No Bathing or showering?:No Getting dressed?:No Getting to the toilet?:No Using the toilet?:No Moving around from place to place?: No  Fall Risk Assessment In the past year have you fallen or had a near fall?:No Are you currently taking any medications that make you dizzy?:No   Hearing Difficulties: No Do you often ask people to speak up or repeat themselves?:No Do you experience ringing or noises in your ears?:No Do you have  difficulty understanding soft or whispered voices?:No  Cognitive Testing  Alert? Yes Normal Appearance?Yes  Oriented to person? Yes Place? Yes  Time? Yes  Displays appropriate judgment?Yes  Can read the correct time from a watch face? yes Are you having problems remembering things?No  Advanced Directives have been discussed with the patient?Yes , full code   List the Names of Other Physician/Practitioners you currently use: see list   Indicate any recent Medical Services you may have received from other than [facility] providers in the past year (date may be approximate).   Assessment:    Annual Wellness Exam   Plan:    During the course of the visit the patient was educated and counseled about appropriate screening and preventive services including:  A healthy diet is rich in fruit, vegetables and whole grains. Poultry fish, nuts and beans are a healthy choice for protein rather then red meat. A low sodium diet and drinking 64 ounces of water daily is generally recommended. Oils and sweet should be limited. Carbohydrates especially for those who are diabetic or overweight, should be limited to 30-45 gram per meal. It is important to eat on a regular schedule, at least 3 times daily. Snacks should be primarily fruits, vegetables or nuts. It is important that you exercise regularly at least 30 minutes 5 times a week. If you develop chest pain, have severe difficulty breathing, or feel very tired, stop exercising immediately and seek medical attention  Immunization reviewed and updated.  Cancer screening reviewed and updated    Patient Instructions (the written plan) was given to the patient.  Medicare Attestation  I have personally reviewed:  The patient's medical and social history  Their use of alcohol, tobacco or illicit drugs  Their current medications and supplements  The patient's functional ability including ADLs,fall risks, home safety risks, cognitive, and hearing and visual  impairment  Diet and physical activities  Evidence for depression or mood disorders  The patient's weight, height, BMI, and visual acuity have been recorded in the chart. I have made referrals, counseling, and provided education to the patient based on review of the above and I have provided the patient with a written personalized care plan for preventive services.      Review of Systems     Objective:   Physical Exam        Assessment & Plan:

## 2013-03-19 NOTE — Assessment & Plan Note (Signed)
Annual exam as documented. Counseling done  re healthy lifestyle involving commitment to 150 minutes exercise per week, heart healthy diet, and attaining healthy weight.The importance of adequate sleep also discussed. Regular seat belt use and safe storage  of firearms if patient has them, is also discussed. Changes in health habits are decided on by the patient with goals and time frames  set for achieving them. Immunization and cancer screening needs are specifically addressed at this visit.  

## 2013-03-21 ENCOUNTER — Ambulatory Visit (HOSPITAL_COMMUNITY)
Admission: RE | Admit: 2013-03-21 | Discharge: 2013-03-21 | Disposition: A | Discharge status: Home / Self Care | Payer: Medicare Other | Source: Ambulatory Visit | Attending: Family Medicine | Admitting: Family Medicine

## 2013-03-21 DIAGNOSIS — Z1231 Encounter for screening mammogram for malignant neoplasm of breast: Secondary | ICD-10-CM | POA: Diagnosis not present

## 2013-03-21 DIAGNOSIS — Z1239 Encounter for other screening for malignant neoplasm of breast: Secondary | ICD-10-CM

## 2013-03-23 ENCOUNTER — Telehealth: Payer: Self-pay | Admitting: Family Medicine

## 2013-03-23 MED ORDER — TEMAZEPAM 30 MG PO CAPS: 30.0000 mg | ORAL_CAPSULE | Freq: Every evening | ORAL | Status: DC | PRN

## 2013-03-23 NOTE — Telephone Encounter (Signed)
Printed and once signed will fax to [facility]

## 2013-04-04 ENCOUNTER — Other Ambulatory Visit: Payer: Self-pay | Admitting: Family Medicine

## 2013-06-18 ENCOUNTER — Other Ambulatory Visit: Payer: Self-pay | Admitting: Family Medicine

## 2013-06-19 ENCOUNTER — Other Ambulatory Visit: Payer: Self-pay | Admitting: Family Medicine

## 2013-07-05 ENCOUNTER — Telehealth: Payer: Self-pay | Admitting: Family Medicine

## 2013-07-05 ENCOUNTER — Other Ambulatory Visit: Payer: Self-pay | Admitting: Family Medicine

## 2013-07-05 DIAGNOSIS — F329 Major depressive disorder, single episode, unspecified: Secondary | ICD-10-CM

## 2013-07-05 DIAGNOSIS — F32A Depression, unspecified: Secondary | ICD-10-CM

## 2013-07-05 MED ORDER — FLUOXETINE HCL 10 MG PO CAPS: 10.0000 mg | ORAL_CAPSULE | Freq: Every day | ORAL | Status: DC

## 2013-07-05 NOTE — Telephone Encounter (Signed)
Med refilled.

## 2013-08-21 ENCOUNTER — Other Ambulatory Visit: Payer: Self-pay

## 2013-08-21 ENCOUNTER — Telehealth: Payer: Self-pay

## 2013-08-21 MED ORDER — ACYCLOVIR 400 MG PO TABS: ORAL_TABLET | ORAL | Status: DC

## 2013-08-21 NOTE — Telephone Encounter (Signed)
Med refilled.

## 2013-08-25 ENCOUNTER — Other Ambulatory Visit: Payer: Self-pay | Admitting: Family Medicine

## 2013-09-04 ENCOUNTER — Encounter (HOSPITAL_COMMUNITY): Payer: Self-pay | Admitting: Emergency Medicine

## 2013-09-04 ENCOUNTER — Emergency Department (HOSPITAL_COMMUNITY)
Admission: EM | Admit: 2013-09-04 | Discharge: 2013-09-04 | Disposition: A | Discharge status: Home / Self Care | Payer: Medicare Other | Attending: Emergency Medicine | Admitting: Emergency Medicine

## 2013-09-04 DIAGNOSIS — G8929 Other chronic pain: Secondary | ICD-10-CM | POA: Insufficient documentation

## 2013-09-04 DIAGNOSIS — Z79899 Other long term (current) drug therapy: Secondary | ICD-10-CM | POA: Diagnosis not present

## 2013-09-04 DIAGNOSIS — F3289 Other specified depressive episodes: Secondary | ICD-10-CM | POA: Insufficient documentation

## 2013-09-04 DIAGNOSIS — Z859 Personal history of malignant neoplasm, unspecified: Secondary | ICD-10-CM | POA: Insufficient documentation

## 2013-09-04 DIAGNOSIS — I10 Essential (primary) hypertension: Secondary | ICD-10-CM | POA: Diagnosis not present

## 2013-09-04 DIAGNOSIS — Z87442 Personal history of urinary calculi: Secondary | ICD-10-CM | POA: Diagnosis not present

## 2013-09-04 DIAGNOSIS — Z8701 Personal history of pneumonia (recurrent): Secondary | ICD-10-CM | POA: Insufficient documentation

## 2013-09-04 DIAGNOSIS — R404 Transient alteration of awareness: Secondary | ICD-10-CM | POA: Diagnosis not present

## 2013-09-04 DIAGNOSIS — E785 Hyperlipidemia, unspecified: Secondary | ICD-10-CM | POA: Insufficient documentation

## 2013-09-04 DIAGNOSIS — Z7982 Long term (current) use of aspirin: Secondary | ICD-10-CM | POA: Insufficient documentation

## 2013-09-04 DIAGNOSIS — K219 Gastro-esophageal reflux disease without esophagitis: Secondary | ICD-10-CM | POA: Diagnosis not present

## 2013-09-04 DIAGNOSIS — Z880 Allergy status to penicillin: Secondary | ICD-10-CM | POA: Insufficient documentation

## 2013-09-04 DIAGNOSIS — F329 Major depressive disorder, single episode, unspecified: Secondary | ICD-10-CM | POA: Diagnosis not present

## 2013-09-04 DIAGNOSIS — H81399 Other peripheral vertigo, unspecified ear: Secondary | ICD-10-CM | POA: Diagnosis not present

## 2013-09-04 DIAGNOSIS — R5381 Other malaise: Secondary | ICD-10-CM | POA: Diagnosis not present

## 2013-09-04 DIAGNOSIS — R42 Dizziness and giddiness: Secondary | ICD-10-CM | POA: Diagnosis not present

## 2013-09-04 HISTORY — DX: Depression, unspecified: F32.A

## 2013-09-04 HISTORY — DX: Major depressive disorder, single episode, unspecified: F32.9

## 2013-09-04 HISTORY — DX: Dizziness and giddiness: R42

## 2013-09-04 HISTORY — DX: Dorsalgia, unspecified: M54.9

## 2013-09-04 HISTORY — DX: Other chronic pain: G89.29

## 2013-09-04 LAB — I-STAT CHEM 8, ED
BUN: 13 mg/dL (ref 6–23)
Calcium, Ion: 1.28 mmol/L (ref 1.13–1.30)
Chloride: 106 mEq/L (ref 96–112)
Creatinine, Ser: 0.9 mg/dL (ref 0.50–1.10)
Glucose, Bld: 96 mg/dL (ref 70–99)
HCT: 46 % (ref 36.0–46.0)
Hemoglobin: 15.6 g/dL — ABNORMAL HIGH (ref 12.0–15.0)
Potassium: 3.7 mEq/L (ref 3.7–5.3)
Sodium: 144 mEq/L (ref 137–147)
TCO2: 21 mmol/L (ref 0–100)

## 2013-09-04 MED ORDER — SODIUM CHLORIDE 0.9 % IV SOLN
INTRAVENOUS | Status: DC
  Administered 2013-09-04: 16:00:00 via INTRAVENOUS

## 2013-09-04 MED ORDER — ONDANSETRON HCL 4 MG/2ML IJ SOLN
4.0000 mg | INTRAMUSCULAR | Status: DC | PRN
  Administered 2013-09-04: 4 mg via INTRAVENOUS
  Filled 2013-09-04: qty 2

## 2013-09-04 MED ORDER — MECLIZINE HCL 12.5 MG PO TABS
25.0000 mg | ORAL_TABLET | Freq: Once | ORAL | Status: AC
  Administered 2013-09-04: 25 mg via ORAL
  Filled 2013-09-04: qty 2

## 2013-09-04 MED ORDER — ONDANSETRON HCL 4 MG PO TABS: 4.0000 mg | ORAL_TABLET | Freq: Three times a day (TID) | ORAL | Status: DC | PRN

## 2013-09-04 MED ORDER — MECLIZINE HCL 25 MG PO TABS: 25.0000 mg | ORAL_TABLET | Freq: Three times a day (TID) | ORAL | Status: DC | PRN

## 2013-09-04 NOTE — ED Notes (Signed)
Pt refuses to let nurse stick for the I-stat, states she wants phlebotomy to draw her because she has had chemo and the phlebotomists are the only ones that can draw her blood.

## 2013-09-04 NOTE — Discharge Instructions (Signed)
°Emergency Department Resource Guide °1) Find a Doctor and Pay Out of Pocket °Although you won't have to find out who is covered by your insurance plan, it is a good idea to ask around and get recommendations. You will then need to call the office and see if the doctor you have chosen will accept you as a new patient and what types of options they offer for patients who are self-pay. Some doctors offer discounts or will set up payment plans for their patients who do not have insurance, but you will need to ask so you aren't surprised when you get to your appointment. ° °2) Contact Your Local Health Department °Not all health departments have doctors that can see patients for sick visits, but many do, so it is worth a call to see if yours does. If you don't know where your local health department is, you can check in your phone book. The CDC also has a tool to help you locate your state's health department, and many state websites also have listings of all of their local health departments. ° °3) Find a Walk-in Clinic °If your illness is not likely to be very severe or complicated, you may want to try a walk in clinic. These are popping up all over the country in pharmacies, drugstores, and shopping centers. They're usually staffed by nurse practitioners or physician assistants that have been trained to treat common illnesses and complaints. They're usually fairly quick and inexpensive. However, if you have serious medical issues or chronic medical problems, these are probably not your best option. ° °No Primary Care Doctor: °- Call [facility] at  832-8000 - they can help you locate a primary care doctor that  accepts your insurance, provides certain services, etc. °- Physician Referral Service- 1-800-533-3463 ° °Chronic Pain Problems: °Organization         Address  Phone   Notes  °[facility]  (336) 297-2271 Patients need to be referred by their primary care doctor.  ° °Medication  Assistance: °Organization         Address  Phone   Notes  °[place] [facility] [address] °[place], [place] [address] (336) 641-8030 --Must be a resident of [place] °-- Must have NO insurance coverage whatsoever (no Medicaid/ Medicare, etc.) °-- The pt. MUST have a primary care doctor that directs their care regularly and follows them in the community °  °[facility]  (866) 331-1348   °[employer]  (888) 892-1162   ° °Agencies that provide inexpensive medical care: °Organization         Address  Phone   Notes  °[facility]  (336) 832-8035   °[facility]    (336) 832-7272   °[facility] [address] °[place], [place] [address] (336) 832-4777   °[facility] [address], °[place] (336) 271-4999   °[facility]    (336) 373-0678   °[facility]    (336) 272-1050   °[facility] ° [address], [place] Phone:  (336) 832-4444, Fax:  (336) 832-4440 Hours of Operation:  9 am - 6 pm, M-F.  Also accepts Medicaid/Medicare and self-pay.  ° [facility] ° [address], [place] Phone: (336) 832-3150, Fax: (336) 832-3151. Hours of Operation:  8:30 am - 5:30 pm, M-F.  Also accepts Medicaid and self-pay.  °[facility] [place] [address], [place] Phone: (336) 878-6027   °[facility] [address], [place], [place] (336)723-1848, Ext. 123 Mondays & Thursdays: 7-9 AM.  First 15 patients are seen on a first come, first serve basis. °  ° °Medicaid-accepting [place] Providers: ° °Organization         Address  Phone   Notes  °[facility] [address], [place] (336) 641-2100 Also accepts self-pay patients.  °[facility] [address], [place] ° (336) 856-9996   °[facility] [address], [place]  (336) 288-8857   °[facility] [address] [place] [address], [place] (336) 299-7000   °Veita Bland [address], [place]  ° (336) 373-1557 Only accepts [place] Access Medicaid patients after they have their name applied to their card.  ° °Self-Pay (no insurance) in [place]: ° °Organization         Address  Phone   Notes  °Sickle Cell Patients, [facility] [address], [place] (336) 832-1970   °[facility] [address], [place] (336) 832-4400   °[facility] ° [address], [place] (336) 992-4800   °[facility]/Dr. Osei-Bonsu ° [address] [place] [address], [place] or [address], [place] (336) 841-8500 Phone number for both [place] and [place] locations is the same.  °[facility] [address], [place] (336) 299-0000   °[facility] [address] [place] [address], [place] or [address] (336) 852-7530 °(336) 878-2260   °[facility] [address], [place] (336) 350-1642, phone; (336) 294-5005, fax Sees patients 1st and 3rd Saturday of every month.  Must not qualify for public or private insurance (i.e. Medicaid, Medicare, [place] Health Choice, Veterans' Benefits) • Household income should be no more than 200% of the poverty level •The clinic cannot treat you if you are pregnant or think you are pregnant • Sexually transmitted diseases are not treated at the clinic.  ° ° °Dental Care: °Organization         Address  Phone  Notes  °[place] [facility] [address], [place] (336) 641-6152 Accepts children up to age 21 who are enrolled in Medicaid or [employer] Health Choice; pregnant women with a Medicaid card; and children who have applied for Medicaid or [place] Health Choice, but were declined, whose parents can pay a reduced fee at time of service.  °[place]  [facility] [place]  [address], [place] (336) 641-7733 Accepts children up to age 21 who are enrolled in Medicaid or [place] Health Choice; pregnant women with a Medicaid card; and children who have applied for Medicaid or [place] Health Choice, but were declined, whose parents can pay a reduced fee at time of service.  °[facility] ° [address], [place] (336) 641-4533 Patients are seen by appointment only. Walk-ins are not accepted. [facility] will see patients 18 years of age and older. °Monday - Tuesday (8am-5pm) °Most Wednesdays (8:30-5pm) °$30 per visit, cash only  °[facility] ° [address], [place] (336) 641-4533 Patients are seen by appointment only. Walk-ins are not accepted. [facility] will see patients 18 years of age and older. °One   Wednesday Evening (Monthly: Volunteer Based).  $30 per visit, cash only  °[facility]  (919) 537-3737 for adults; Children under age 4, call Graduate Pediatric Dentistry at (919) [phone]. Children aged 4-14, please call (919) [phone] to request a pediatric application. ° Dental services are provided in all areas of dental care including fillings, crowns and bridges, complete and partial dentures, implants, gum treatment, root canals, and extractions. Preventive care is also provided. Treatment is provided to both adults and children. °Patients are selected via a lottery and there is often a waiting list. °  °[facility] [address], °[place] ° (336) 763-8833 [email or web address] °  °[facility] [address], [place], [place] (336)723-1848, Ext. 123 Second and Fourth Thursday of each month, opens at 6:30 AM; Clinic ends at 9 AM.  Patients are seen on a first-come first-served basis, and a limited number are seen during each clinic.  ° °[facility] ° [address], [place], [place] (336) 723-7904    Eligibility Requirements °You must have lived in [place], [place], or [place] counties for at least the last three months. °  You cannot be eligible for state or federal sponsored healthcare insurance, including Veterans Administration, Medicaid, or Medicare. °  You generally cannot be eligible for healthcare insurance through your employer.  °  How to apply: °Eligibility screenings are held every Tuesday and Wednesday afternoon from 1:00 pm until 4:00 pm. You do not need an appointment for the interview!  °[facility] [address], [place], [place] 336-631-2330   °[facility]  336-342-8273   °[facility]  336-703-3100   °[facility]  336-570-6415   ° °Behavioral Health Resources in the Community: °Intensive Outpatient Programs °Organization         Address  Phone  Notes  °[facility] [address], [place], [place] 336-878-6098   °[facility] [address], [place], [place] 336-832-9800   °[facility] [address], [place], [place] ° 336-882-2125   °[facility] [address],  °[place], [place] 1-800-853-5163 or 336-641-4981   °Substance Abuse Resources °Organization         Address  Phone  Notes  °[facility]  336-882-2125   °[facility]  336-784-9470   °[facility]  336-285-9073   °[facility]  336-845-3988   °Residential & Outpatient Substance Abuse Program  1-800-659-3381   °Psychological Services °Organization         Address  Phone  Notes  °[facility]  336- 832-9600   °[facility]  336- 378-7881   °[facility] [address], [place] 1-800-853-5163 or 336-641-4981   ° °Mobile Crisis Teams °Organization         Address  Phone  Notes  °[facility], Mobile Crisis Care Unit  1-877-626-1772   °Assertive °[facility] ° [address]  [place], [place] 336-834-9664   °Sharon DeEsch [address] °[place] 336-554-5454   ° °Self-Help/Support Groups °Organization         Address  Phone             Notes  °[facility] [place] - variety of support groups  336- 373-1402 Call for more information  °Narcotics Anonymous (NA), [facility] [address], °[place]  2 meetings at this location  ° °  Residential Treatment Programs °Organization         Address  Phone  Notes  °[facility] [address],    °[place]  1-866-801-8205   °[facility] ° [address], [place], [place] 704-293-8524   °[facility] [address], [place] 336-845-3988 Admissions: 8am-3pm M-F  °[facility] [address]    °[place], [place] 336-841-1104   °[facility] [address], [place], [place] 336-379-7146   °[facility] [address]  °[place], [place] 336-285-9073   °[facility] - Intensive Outpatient [address], [place], [place] 336-852-3033   °[facility] [address]  °[place], [place] 1-877-615-2722 or 336-784-9470   °[facility] [address] [place], [place] 336-227-7417 Accepts Medicaid  °[facility] [address]  °[place] 1-800-659-3381 Substance Abuse/Addiction Treatment  ° °[place] Behavioral Health Resources °Organization         Address  Phone  Notes  °[facility]  (888) 581-9988   °Julie Brannon, PhD [address] [place], [place]   (336) 349-5553 or (336) 951-0000   °[facility]   [address] °[place], [place] (336) 349-4454   °[facility] [address], [place], [place] (336) 342-8316 Insurance/Medicaid/sponsorship through [facility]  °[facility] [address]                                    [place], [place] (336) 342-8316 Therapy/tele-psych/case    °[facility] [address]  ° [place], [place] (336) 349-2233    °Dr. Arfeen  (336) 349-4544   °[facility] [place]  [employer] [place] [facility] 1) [address], [place] °2) [address], [place] °3)  [address], [place] (336) 349-3220 °(336) 342-7768 ° °(336) 342-8140   °[place] Child [facility] (336) 342-1394 or (336) 342-3537 (After Hours)    ° ° °Take the prescriptions as directed.  Call your regular medical doctor tomorrow to schedule a follow up appointment within the next 2 days.  Return to the Emergency Department immediately sooner if worsening.  ° °

## 2013-09-04 NOTE — ED Provider Notes (Signed)
CSN: [id number]     Arrival date & time 09/04/13  1458 History   First MD Initiated Contact with Patient 09/04/13 1510     Chief Complaint  Patient presents with  . Dizziness      HPI Pt was seen at 1525. Per pt and her family, c/o sudden onset and persistence of constant "dizziness" that began 1 hour PTA. Has been associated with N/V. Symptoms worsen when she moves her head or eyes side-to-side, as well as when she attempts to ambulate. Pt describes her symptoms as "everything is spinning around," consistent with her previous vertigo symptoms. Pt states she "used to have meclizine" but "not anymore." Pt she feels like she has "an inner ear infection like the last time," because she hears her ears "popping." Denies CP/palpitations, no SOB/cough, no abd pain, no diarrhea, no falls, no syncope, no focal motor weakness, no tingling/numbness in extremities, no facial droop, no slurred speech.    Past Medical History  Diagnosis Date  . Insomnia   . Hyperlipidemia   . Hypertension   . Cancer   . Pneumonia   . GERD (gastroesophageal reflux disease)   . Bronchitis   . Kidney stone   . Depression   . Chronic back pain   . Vertigo    Past Surgical History  Procedure Laterality Date  . Abdominal hysterectomy    . Breast surgery      left  . Tubal ligation    . Cystoscopy/retrograde/ureteroscopy/stone extraction with basket  01/07/2011    Procedure: CYSTOSCOPY/RETROGRADE/URETEROSCOPY/STONE EXTRACTION WITH BASKET;  Surgeon: Marissa Nestle;  Location: [facility];  Service: Urology;  Laterality: Left;  Balloon Dilatation; stone given to family per MD   Family History  Problem Relation Age of Onset  . COPD Father   . COPD Sister   . COPD Sister   . Diabetes Sister   . Diabetes Brother   . Heart disease Brother    History  Substance Use Topics  . Smoking status: Never Smoker   . Smokeless tobacco: Not on file  . Alcohol Use: No    Review of Systems ROS: Statement: All systems  negative except as marked or noted in the HPI; Constitutional: Negative for fever and chills. ; ; Eyes: Negative for eye pain, redness and discharge. ; ; ENMT: Negative for ear pain, hoarseness, nasal congestion, sinus pressure and sore throat. ; ; Cardiovascular: Negative for chest pain, palpitations, diaphoresis, dyspnea and peripheral edema. ; ; Respiratory: Negative for cough, wheezing and stridor. ; ; Gastrointestinal: +N/V. Negative for diarrhea, abdominal pain, blood in stool, hematemesis, jaundice and rectal bleeding. . ; ; Genitourinary: Negative for dysuria, flank pain and hematuria. ; ; Musculoskeletal: Negative for back pain and neck pain. Negative for swelling and trauma.; ; Skin: Negative for pruritus, rash, abrasions, blisters, bruising and skin lesion.; ; Neuro: +vertigo. Negative for headache, lightheadedness and neck stiffness. Negative for weakness, altered level of consciousness , altered mental status, extremity weakness, paresthesias, involuntary movement, seizure and syncope.      Allergies  Penicillins  Home Medications   Prior to Admission medications   Medication Sig Start Date End Date Taking? Authorizing Provider  acyclovir (ZOVIRAX) 400 MG tablet TAKE ONE TABLET BY MOUTH TWICE DAILY 08/21/13  Yes Fayrene Helper, MD  amLODipine-benazepril (LOTREL) 5-40 MG per capsule TAKE 1 CAPSULE DAILY   Yes Fayrene Helper, MD  atorvastatin (LIPITOR) 20 MG tablet TAKE 1 TABLET (20 MG TOTAL) EVERY EVENING 04/04/13  Yes Norwood Levo  Moshe Cipro, MD  Diclofenac Potassium (ZIPSOR) 25 MG CAPS Take 1 capsule by mouth 4 (four) times daily as needed (back pain).   Yes Historical Provider, MD  FLUoxetine (PROZAC) 10 MG capsule Take 1 capsule (10 mg total) by mouth daily. 07/05/13 07/05/14 Yes Fayrene Helper, MD  temazepam (RESTORIL) 30 MG capsule Take 1 capsule (30 mg total) by mouth at bedtime as needed. 03/23/13  Yes Fayrene Helper, MD  aspirin EC 81 MG tablet Take 81 mg by mouth 2 (two)  times a week.    Historical Provider, MD  loratadine (CLARITIN) 10 MG tablet Take 1 tablet (10 mg total) by mouth daily. 07/11/12 07/11/13  Fayrene Helper, MD   BP 158/86  Pulse 85  Temp(Src) 97.8 F (36.6 C) (Oral)  Resp 18  Ht 5\' 4"  (1.626 m)  Wt 186 lb (84.369 kg)  BMI 31.91 kg/m2  SpO2 96% Physical Exam 1530: Physical examination:  Nursing notes reviewed; Vital signs and O2 SAT reviewed;  Constitutional: Well developed, Well nourished, Well hydrated, In no acute distress; Head:  Normocephalic, atraumatic; Eyes: EOMI, PERRL, No scleral icterus; ENMT:  Clear fluid behind TM's bilat, without erythema or bulging. +edemetous nasal turbinates bilat with clear rhinorrhea. Mouth and pharynx normal, Mucous membranes moist; Neck: Supple, Full range of motion, No lymphadenopathy; Cardiovascular: Regular rate and rhythm, No gallop; Respiratory: Breath sounds clear & equal bilaterally, No wheezes.  Speaking full sentences with ease, Normal respiratory effort/excursion; Chest: Nontender, Movement normal; Abdomen: Soft, Nontender, Nondistended, Normal bowel sounds; Genitourinary: No CVA tenderness; Extremities: Pulses normal, No tenderness, No edema, No calf edema or asymmetry.; Neuro: AA&Ox3, Major CN grossly intact.Speech clear.  No facial droop.  +right horizontal end gaze fatigable nystagmus which reproduces pt's symptoms. Grips equal. Strength 5/5 equal bilat UE's and LE's.  DTR 2/4 equal bilat UE's and LE's.  No gross sensory deficits.  Normal cerebellar testing bilat UE's (finger-nose) and LE's (heel-shin)..; Skin: Color normal, Warm, Dry.   ED Course  Procedures    EKG Interpretation   Date/Time:  Monday September 04 2013 15:24:37 EDT Ventricular Rate:  71 PR Interval:  191 QRS Duration: 82 QT Interval:  395 QTC Calculation: 429 R Axis:   55 Text Interpretation:  Sinus rhythm Normal ECG When compared with ECG of  01/07/2011 No significant change was found Confirmed by [facility]  MD,   Nunzio Cory [phone]) on 09/04/2013 5:52:56 PM      MDM  MDM Reviewed: previous chart, nursing note and vitals Reviewed previous: labs and ECG Interpretation: labs and ECG     Results for orders placed during the hospital encounter of 09/04/13  I-STAT CHEM 8, ED      Result Value Ref Range   Sodium 144  137 - 147 mEq/L   Potassium 3.7  3.7 - 5.3 mEq/L   Chloride 106  96 - 112 mEq/L   BUN 13  6 - 23 mg/dL   Creatinine, Ser 0.90  0.50 - 1.10 mg/dL   Glucose, Bld 96  70 - 99 mg/dL   Calcium, Ion 1.28  1.13 - 1.30 mmol/L   TCO2 21  0 - 100 mmol/L   Hemoglobin 15.6 (*) 12.0 - 15.0 g/dL   HCT 46.0  36.0 - 46.0 %    1655:  Pt is not orthostatic on VS. Pt has ambulated with steady gait, easy resps. Neuro exam remains intact. Pt states she feels better now and wants to go home. Dx and testing d/w pt and family.  Questions answered.  Verb understanding, agreeable to d/c home with outpt f/u.     Alfonzo Feller, DO 09/07/13 1435

## 2013-09-04 NOTE — ED Notes (Signed)
Dizziness 1 hour prior to arrival. 4mg  IV zofran after pt vomited per EMS. Pale in appearance.

## 2013-09-04 NOTE — ED Notes (Signed)
Pt ambulated, pt slightly dizzy at first but maintained balance, tolerated well.

## 2013-09-07 ENCOUNTER — Encounter: Payer: Self-pay | Admitting: Family Medicine

## 2013-09-07 ENCOUNTER — Ambulatory Visit (INDEPENDENT_AMBULATORY_CARE_PROVIDER_SITE_OTHER): Payer: Medicare Other | Admitting: Family Medicine

## 2013-09-07 VITALS — BP 130/74 | HR 77 | Resp 18 | Ht 67.0 in | Wt 189.1 lb

## 2013-09-07 DIAGNOSIS — R42 Dizziness and giddiness: Secondary | ICD-10-CM | POA: Diagnosis not present

## 2013-09-07 DIAGNOSIS — I10 Essential (primary) hypertension: Secondary | ICD-10-CM | POA: Diagnosis not present

## 2013-09-07 DIAGNOSIS — H8110 Benign paroxysmal vertigo, unspecified ear: Secondary | ICD-10-CM | POA: Insufficient documentation

## 2013-09-07 DIAGNOSIS — E785 Hyperlipidemia, unspecified: Secondary | ICD-10-CM

## 2013-09-07 MED ORDER — MECLIZINE HCL 25 MG PO TABS: 25.0000 mg | ORAL_TABLET | Freq: Three times a day (TID) | ORAL | Status: DC | PRN

## 2013-09-07 MED ORDER — TEMAZEPAM 30 MG PO CAPS: 30.0000 mg | ORAL_CAPSULE | Freq: Every evening | ORAL | Status: DC | PRN

## 2013-09-07 NOTE — Patient Instructions (Signed)
F/u  In 3.5 months, call if you need me before  You are referred to ENT asap, we will call  With appt    Meclizine is refilled

## 2013-09-07 NOTE — Progress Notes (Signed)
   Subjective:    Patient ID: Theresa Garcia, female    DOB: Jul 11, 1940, 73 y.o.   MRN: [id number]  HPI Pt had acute onset of severe vertigo starting this past Monday, has responded to scheduled antivert, has nausea with the vertigo, and she uses oral med with relief.movement of her head aggravates the symptom currently Drowsy from medication she is taking, the  Symptoms are less severe than when they initially started, however, still persist. Family is concerned that pt has been more unsteady and having to "hold" on" over the past approximately 4 weeks, not associated with change in position Denies any recent viral illness, has had vertigo ion the past   Review of Systems See HPI Denies recent fever or chills. Denies sinus pressure, nasal congestion, ear pain or sore throat. Denies chest congestion, productive cough or wheezing. Denies chest pains, palpitations and leg swelling Denies abdominal pain, nausea, vomiting,diarrhea or constipation.   Denies dysuria, frequency, hesitancy or incontinence. Denies joint pain, swelling and limitation in mobility. Denies headaches, seizures, numbness, or tingling. Denies uncontrolled depression, anxiety or insomnia. Denies skin break down or rash.        Objective:   Physical Exam BP 130/74  Pulse 77  Resp 18  Ht 5\' 7"  (1.702 m)  Wt 189 lb 1.9 oz (85.784 kg)  BMI 29.61 kg/m2  SpO2 96% Patient alert and oriented,  drowsy and in no cardiopulmonary distress.  HEENT: No facial asymmetry, EOMI,   oropharynx pink and moist.  Neck  no JVD, no mass No lymphadenopathy.TM clear bilaterally  Chest: Clear to auscultation bilaterally.  CVS: S1, S2 no murmurs, no S3.Regular rate.  ABD: Soft non tender.   Ext: No edema    Skin: Intact, no ulcerations or rash noted.  Psych: Good eye contact, flat  affect. Mildly depressed appearing.  CNS: CN 2-12 intact, power,  normal throughout.no focal deficits noted.        Assessment & Plan:    Vertigo Acute disabling vertigo, some response to symptomatic meds, however, with 4 week h/o intermittent imbalance, will refer for further evaluation by ENT, script provided for meclizine, pt reports having nausea meds at home  HYPERTENSION Controlled, no change in medication   HYPERLIPIDEMIA Hyperlipidemia:Low fat diet discussed and encouraged.  Updated lab needed , this is past due

## 2013-09-08 NOTE — Assessment & Plan Note (Signed)
Acute disabling vertigo, some response to symptomatic meds, however, with 4 week h/o intermittent imbalance, will refer for further evaluation by ENT, script provided for meclizine, pt reports having nausea meds at home

## 2013-09-08 NOTE — Assessment & Plan Note (Signed)
Controlled, no change in medication  

## 2013-09-08 NOTE — Assessment & Plan Note (Signed)
Hyperlipidemia:Low fat diet discussed and encouraged.  Updated lab needed , this is past due

## 2013-09-13 ENCOUNTER — Telehealth: Payer: Self-pay | Admitting: Family Medicine

## 2013-09-14 NOTE — Telephone Encounter (Signed)
Theresa Garcia is aware of the patients appointment which has been changed to Dr Benjamine Mola in [place]

## 2013-09-18 ENCOUNTER — Ambulatory Visit: Payer: Medicare Other | Admitting: Family Medicine

## 2013-09-19 ENCOUNTER — Telehealth: Payer: Self-pay | Admitting: Family Medicine

## 2013-09-19 DIAGNOSIS — R42 Dizziness and giddiness: Secondary | ICD-10-CM | POA: Diagnosis not present

## 2013-09-19 DIAGNOSIS — H903 Sensorineural hearing loss, bilateral: Secondary | ICD-10-CM | POA: Diagnosis not present

## 2013-09-20 NOTE — Telephone Encounter (Signed)
noted 

## 2013-10-09 ENCOUNTER — Telehealth: Payer: Self-pay | Admitting: Family Medicine

## 2013-10-10 NOTE — Telephone Encounter (Signed)
pls get specifics of her complaint I will go from there,

## 2013-10-12 ENCOUNTER — Other Ambulatory Visit: Payer: Self-pay | Admitting: Family Medicine

## 2013-10-12 DIAGNOSIS — R42 Dizziness and giddiness: Secondary | ICD-10-CM

## 2013-10-12 NOTE — Telephone Encounter (Signed)
Patient states that she is still having dizziness and has already seen ENT.  Would like to be seen at [facility]

## 2013-10-12 NOTE — Telephone Encounter (Signed)
completed

## 2013-10-24 ENCOUNTER — Other Ambulatory Visit: Payer: Self-pay | Admitting: Family Medicine

## 2013-11-01 ENCOUNTER — Ambulatory Visit: Payer: Self-pay | Admitting: Cardiovascular Disease

## 2013-11-22 ENCOUNTER — Other Ambulatory Visit: Payer: Self-pay | Admitting: Family Medicine

## 2013-11-27 ENCOUNTER — Ambulatory Visit (INDEPENDENT_AMBULATORY_CARE_PROVIDER_SITE_OTHER): Payer: Medicare Other | Admitting: Family Medicine

## 2013-11-27 ENCOUNTER — Encounter: Payer: Self-pay | Admitting: Family Medicine

## 2013-11-27 ENCOUNTER — Encounter (INDEPENDENT_AMBULATORY_CARE_PROVIDER_SITE_OTHER): Payer: Self-pay

## 2013-11-27 VITALS — BP 130/82 | HR 74 | Resp 16 | Ht 67.0 in | Wt 187.0 lb

## 2013-11-27 DIAGNOSIS — F418 Other specified anxiety disorders: Secondary | ICD-10-CM

## 2013-11-27 DIAGNOSIS — I10 Essential (primary) hypertension: Secondary | ICD-10-CM | POA: Diagnosis not present

## 2013-11-27 DIAGNOSIS — G4700 Insomnia, unspecified: Secondary | ICD-10-CM | POA: Diagnosis not present

## 2013-11-27 DIAGNOSIS — Z23 Encounter for immunization: Secondary | ICD-10-CM

## 2013-11-27 DIAGNOSIS — E785 Hyperlipidemia, unspecified: Secondary | ICD-10-CM

## 2013-11-27 DIAGNOSIS — A6004 Herpesviral vulvovaginitis: Secondary | ICD-10-CM

## 2013-11-27 DIAGNOSIS — R42 Dizziness and giddiness: Secondary | ICD-10-CM

## 2013-11-27 MED ORDER — AMLODIPINE BESY-BENAZEPRIL HCL 5-40 MG PO CAPS: ORAL_CAPSULE | ORAL | Status: DC

## 2013-11-27 MED ORDER — TEMAZEPAM 30 MG PO CAPS: 30.0000 mg | ORAL_CAPSULE | Freq: Every evening | ORAL | Status: DC | PRN

## 2013-11-27 MED ORDER — ATORVASTATIN CALCIUM 20 MG PO TABS: ORAL_TABLET | ORAL | Status: DC

## 2013-11-27 NOTE — Assessment & Plan Note (Signed)
Sleep hygiene reviewed and written information offered also. Prescription sent for  medication needed. Controlled, no change in medication  

## 2013-11-27 NOTE — Assessment & Plan Note (Signed)
lactose intolerance. Updated lab needed at/ before next visit.

## 2013-11-27 NOTE — Patient Instructions (Addendum)
Annual wellness in 4 month, call if you need me  Before  My condolence with regard to your brother's recnt passing, and I hope that your sister will soon start doing better  You need to eat regularly 3 times daily, so that you do not get light headed and dizzy, also please commit to drinking sufficient water  Flu vaccine today  BP today is excellent  Fasting lipid and cmp as soon as possible  Meds will be refilled including medciation to help with sleep

## 2013-11-27 NOTE — Assessment & Plan Note (Signed)
Intermittent and ongoing uses antivert as needed

## 2013-11-27 NOTE — Assessment & Plan Note (Signed)
Controlled, no change in medication  

## 2013-11-27 NOTE — Assessment & Plan Note (Signed)
Increased with recent passing of sibling , continue current med

## 2013-11-27 NOTE — Assessment & Plan Note (Signed)
Vaccine administered at visit.  

## 2013-11-27 NOTE — Assessment & Plan Note (Signed)
Controlled on chronic medication, no change in management

## 2013-11-27 NOTE — Progress Notes (Signed)
   Subjective:    Patient ID: Theresa Garcia, female    DOB: May 25, 1940, 73 y.o.   MRN: [id number]  HPI The PT is here for follow up and re-evaluation of chronic medical conditions, medication management and review of any available recent lab and radiology data.  Preventive health is updated, specifically  Cancer screening and Immunization.  Flu vaccine administered Still grieving the unexpected passing of her brother, her mentally challenged sister with breast ca is not doing well, and at this time is in the ED The PT denies any adverse reactions to current medications since the last visit.  Still has intermittent light headedness/ vertigo states she knows at times trhis is related to poor intake, had scheduled cardiology eval but was unable to attend due to brother's passing, will reschedule     Review of Systems See HPI Denies recent fever or chills. Denies sinus pressure, nasal congestion, ear pain or sore throat. Denies chest congestion, productive cough or wheezing. Denies chest pains, palpitations and leg swelling Denies abdominal pain, nausea, vomiting,diarrhea or constipation.   Denies dysuria, frequency, hesitancy or incontinence. Denies joint pain, swelling and limitation in mobility. Denies headaches, seizures, numbness, or tingling. Denies skin break down or rash.        Objective:   Physical Exam BP 130/82  Pulse 74  Resp 16  Ht 5\' 7"  (1.702 m)  Wt 187 lb (84.823 kg)  BMI 29.28 kg/m2  SpO2 98% Patient alert and oriented and in no cardiopulmonary distress.  HEENT: No facial asymmetry, EOMI,   oropharynx pink and moist.  Neck supple no JVD, no mass.  Chest: Clear to auscultation bilaterally.  CVS: S1, S2 no murmurs, no S3.Regular rate.  ABD: Soft non tender.   Ext: No edema  MS: Adequate ROM spine, shoulders, hips and knees.  Skin: Intact, no ulcerations or rash noted.  Psych: Good eye contact, tearful  affect. Memory intact not anxious but   depressed appearing.  CNS: CN 2-12 intact, power,  normal throughout.no focal deficits noted.        Assessment & Plan:  Essential hypertension Controlled, no change in medication   Hyperlipidemia lactose intolerance. Updated lab needed at/ before next visit.   Insomnia Sleep hygiene reviewed and written information offered also. Prescription sent for  medication needed. Controlled, no change in medication   Vertigo Intermittent and ongoing uses antivert as needed  Type 2 HSV infection of vulvovaginal region Controlled on chronic medication, no change in management  Depression with anxiety Increased with recent passing of sibling , continue current med  Need for prophylactic vaccination and inoculation against influenza Vaccine administered at visit.

## 2013-12-12 ENCOUNTER — Other Ambulatory Visit: Payer: Self-pay

## 2013-12-12 DIAGNOSIS — R42 Dizziness and giddiness: Secondary | ICD-10-CM

## 2013-12-12 MED ORDER — MECLIZINE HCL 25 MG PO TABS: 25.0000 mg | ORAL_TABLET | Freq: Three times a day (TID) | ORAL | Status: DC | PRN

## 2013-12-27 ENCOUNTER — Other Ambulatory Visit: Payer: Self-pay | Admitting: Family Medicine

## 2014-02-21 ENCOUNTER — Other Ambulatory Visit: Payer: Self-pay | Admitting: Family Medicine

## 2014-02-27 ENCOUNTER — Other Ambulatory Visit: Payer: Self-pay | Admitting: Family Medicine

## 2014-02-27 DIAGNOSIS — Z1231 Encounter for screening mammogram for malignant neoplasm of breast: Secondary | ICD-10-CM

## 2014-03-08 ENCOUNTER — Telehealth: Payer: Self-pay

## 2014-03-08 NOTE — Telephone Encounter (Signed)
States she has an itch under her arm where she had surgery at years ago and there is no redness or rash, just itchy. I advised her to get some otc hydrocortisone cream and rub on the area and to call back if it doesn't get any better. Patient understands and agrees with plan

## 2014-03-09 ENCOUNTER — Telehealth: Payer: Self-pay

## 2014-03-09 NOTE — Telephone Encounter (Signed)
would suggest continue the benadryl, arrange a work in next week early as able pls pls ask her to be prepared to either get labs  the day of the visit or on Saturday she is WAY past due and on multiple meds

## 2014-03-12 NOTE — Telephone Encounter (Signed)
Called for patient to return call

## 2014-03-13 NOTE — Telephone Encounter (Signed)
Patient will be seen 1/20

## 2014-03-14 ENCOUNTER — Encounter: Payer: Self-pay | Admitting: Family Medicine

## 2014-03-14 ENCOUNTER — Ambulatory Visit (INDEPENDENT_AMBULATORY_CARE_PROVIDER_SITE_OTHER): Payer: Medicare Other | Admitting: Family Medicine

## 2014-03-14 VITALS — BP 130/80 | HR 78 | Resp 16 | Ht 67.0 in | Wt 191.1 lb

## 2014-03-14 DIAGNOSIS — J3089 Other allergic rhinitis: Secondary | ICD-10-CM | POA: Diagnosis not present

## 2014-03-14 DIAGNOSIS — Z1231 Encounter for screening mammogram for malignant neoplasm of breast: Secondary | ICD-10-CM

## 2014-03-14 DIAGNOSIS — G4700 Insomnia, unspecified: Secondary | ICD-10-CM | POA: Diagnosis not present

## 2014-03-14 DIAGNOSIS — Z853 Personal history of malignant neoplasm of breast: Secondary | ICD-10-CM

## 2014-03-14 DIAGNOSIS — Z23 Encounter for immunization: Secondary | ICD-10-CM

## 2014-03-14 DIAGNOSIS — R42 Dizziness and giddiness: Secondary | ICD-10-CM

## 2014-03-14 DIAGNOSIS — F418 Other specified anxiety disorders: Secondary | ICD-10-CM

## 2014-03-14 DIAGNOSIS — I10 Essential (primary) hypertension: Secondary | ICD-10-CM

## 2014-03-14 DIAGNOSIS — E785 Hyperlipidemia, unspecified: Secondary | ICD-10-CM

## 2014-03-14 DIAGNOSIS — B369 Superficial mycosis, unspecified: Secondary | ICD-10-CM | POA: Diagnosis not present

## 2014-03-14 DIAGNOSIS — E559 Vitamin D deficiency, unspecified: Secondary | ICD-10-CM | POA: Diagnosis not present

## 2014-03-14 DIAGNOSIS — A6004 Herpesviral vulvovaginitis: Secondary | ICD-10-CM | POA: Diagnosis not present

## 2014-03-14 LAB — COMPREHENSIVE METABOLIC PANEL
ALT: 23 U/L (ref 0–35)
AST: 27 U/L (ref 0–37)
Albumin: 3.9 g/dL (ref 3.5–5.2)
Alkaline Phosphatase: 61 U/L (ref 39–117)
BUN: 14 mg/dL (ref 6–23)
CO2: 21 mEq/L (ref 19–32)
Calcium: 9.5 mg/dL (ref 8.4–10.5)
Chloride: 109 mEq/L (ref 96–112)
Creat: 0.79 mg/dL (ref 0.50–1.10)
Glucose, Bld: 85 mg/dL (ref 70–99)
Potassium: 4.5 mEq/L (ref 3.5–5.3)
Sodium: 140 mEq/L (ref 135–145)
Total Bilirubin: 0.5 mg/dL (ref 0.2–1.2)
Total Protein: 6.9 g/dL (ref 6.0–8.3)

## 2014-03-14 LAB — CBC WITH DIFFERENTIAL/PLATELET
Basophils Absolute: 0 10*3/uL (ref 0.0–0.1)
Basophils Relative: 0 % (ref 0–1)
Eosinophils Absolute: 0.2 10*3/uL (ref 0.0–0.7)
Eosinophils Relative: 2 % (ref 0–5)
HCT: 42.1 % (ref 36.0–46.0)
Hemoglobin: 14 g/dL (ref 12.0–15.0)
Lymphocytes Relative: 26 % (ref 12–46)
Lymphs Abs: 2.1 10*3/uL (ref 0.7–4.0)
MCH: 30.7 pg (ref 26.0–34.0)
MCHC: 33.3 g/dL (ref 30.0–36.0)
MCV: 92.3 fL (ref 78.0–100.0)
MPV: 9.6 fL (ref 8.6–12.4)
Monocytes Absolute: 0.6 10*3/uL (ref 0.1–1.0)
Monocytes Relative: 7 % (ref 3–12)
Neutro Abs: 5.1 10*3/uL (ref 1.7–7.7)
Neutrophils Relative %: 65 % (ref 43–77)
Platelets: 323 10*3/uL (ref 150–400)
RBC: 4.56 MIL/uL (ref 3.87–5.11)
RDW: 14.1 % (ref 11.5–15.5)
WBC: 7.9 10*3/uL (ref 4.0–10.5)

## 2014-03-14 LAB — LIPID PANEL
Cholesterol: 160 mg/dL (ref 0–200)
HDL: 51 mg/dL (ref >39)
LDL Cholesterol: 85 mg/dL (ref 0–99)
Total CHOL/HDL Ratio: 3.1 Ratio
Triglycerides: 119 mg/dL (ref <150)
VLDL: 24 mg/dL (ref 0–40)

## 2014-03-14 LAB — TSH: TSH: 1.133 u[IU]/mL (ref 0.350–4.500)

## 2014-03-14 MED ORDER — ACYCLOVIR 400 MG PO TABS: 400.0000 mg | ORAL_TABLET | Freq: Two times a day (BID) | ORAL | Status: DC

## 2014-03-14 MED ORDER — CLOTRIMAZOLE-BETAMETHASONE 1-0.05 % EX CREA: 1.0000 "application " | TOPICAL_CREAM | Freq: Two times a day (BID) | CUTANEOUS | Status: DC

## 2014-03-14 NOTE — Patient Instructions (Signed)
Annual physical exam in 4 month, call if you need me before  Prevnar today  Antifungal cream sent to your pharmacy for itchy rash in your left armpit  Fasting labs TODAY please  Your mammogram is already scheduled for 01/29 , at 1:40pm at [facility] , please keep the appointment  Condolence and prayers for  you and family  All the best for 2016!  Plan to re join and go to the [facility] by March please!

## 2014-03-14 NOTE — Progress Notes (Signed)
   Subjective:    Patient ID: Theresa Garcia, female    DOB: 03-31-1940, 74 y.o.   MRN: [id number]  HPI The PT is here for follow up and re-evaluation of chronic medical conditions, medication management and review of any available recent lab and radiology data.  Preventive health is updated, specifically  Cancer screening and Immunization.    The PT denies any adverse reactions to current medications since the last visit.  C/o itchy rash in left axilla in area of scar x 1 week, no drainage or fever, no new personal products Still dealing with the loss of 2 siblings in the past 6 months, family support is getting her through     Review of Systems See HPI Denies recent fever or chills. Denies sinus pressure, nasal congestion, ear pain or sore throat. Denies chest congestion, productive cough or wheezing. Denies chest pains, palpitations and leg swelling Denies abdominal pain, nausea, vomiting,diarrhea or constipation.   Denies dysuria, frequency, hesitancy or incontinence. Denies joint pain, swelling and limitation in mobility. Denies headaches, seizures, numbness, or tingling. Denies uncontrolled  depression, anxiety or insomnia.        Objective:   Physical Exam  BP 130/80 mmHg  Pulse 78  Resp 16  Ht 5\' 7"  (1.702 m)  Wt 191 lb 1.9 oz (86.691 kg)  BMI 29.93 kg/m2  SpO2 96% Patient alert and oriented and in no cardiopulmonary distress.  HEENT: No facial asymmetry, EOMI,   oropharynx pink and moist.  Neck supple no JVD, no mass.  Chest: Clear to auscultation bilaterally. Breast: no mass, asymetry or nipple inversion or discharge. Scar in left axilla does not appear infected, scaly mildly erythematous rash in left axilla CVS: S1, S2 no murmurs, no S3.Regular rate.  ABD: Soft non tender.   Ext: No edema  MS: Adequate ROM spine, shoulders, hips and knees.  Skin: Intact, fungal rash noted in left axilla , max diameter approx 2 in  Psych: Good eye contact, normal  affect. Memory intact not anxious or depressed appearing.  CNS: CN 2-12 intact, power,  normal throughout.no focal deficits noted.       Assessment & Plan:  Essential hypertension Controlled, no change in medication DASH diet and commitment to daily physical activity for a minimum of 30 minutes discussed and encouraged, as a part of hypertension management. The importance of attaining a healthy weight is also discussed.    Allergic rhinitis Controlled, no change in medication    Type 2 HSV infection of vulvovaginal region No current flare, maintained on prophylactic med   Depression with anxiety Controlled, no change in medication Handling passage of 2 siblings in the last 6 months well   Hyperlipidemia Hyperlipidemia:Low fat diet discussed and encouraged. Controlled, no change in medication      Insomnia Sleep hygiene reviewed and written information offered also. Prescription sent for  medication needed. Controlled, no change in medication    Vertigo Intermittent flares, uses antivert as needed   Need for vaccination with 13-polyvalent pneumococcal conjugate vaccine Vaccine administered at visit.    Dermatomycosis Fungal rash in left axilla, topical prep prescribed for 10 days, then as needed, pt to keep area dry .

## 2014-03-15 LAB — VITAMIN D 25 HYDROXY (VIT D DEFICIENCY, FRACTURES): Vit D, 25-Hydroxy: 7 ng/mL — ABNORMAL LOW (ref 30–100)

## 2014-03-15 MED ORDER — VITAMIN D (ERGOCALCIFEROL) 1.25 MG (50000 UNIT) PO CAPS: 50000.0000 [IU] | ORAL_CAPSULE | ORAL | Status: DC

## 2014-03-16 DIAGNOSIS — B369 Superficial mycosis, unspecified: Secondary | ICD-10-CM | POA: Insufficient documentation

## 2014-03-16 DIAGNOSIS — Z23 Encounter for immunization: Secondary | ICD-10-CM | POA: Insufficient documentation

## 2014-03-16 NOTE — Assessment & Plan Note (Signed)
Vaccine administered at visit.  

## 2014-03-16 NOTE — Assessment & Plan Note (Signed)
No current flare, maintained on prophylactic med

## 2014-03-16 NOTE — Assessment & Plan Note (Signed)
Controlled, no change in medication DASH diet and commitment to daily physical activity for a minimum of 30 minutes discussed and encouraged, as a part of hypertension management. The importance of attaining a healthy weight is also discussed.  

## 2014-03-16 NOTE — Assessment & Plan Note (Signed)
Sleep hygiene reviewed and written information offered also. Prescription sent for  medication needed. Controlled, no change in medication  

## 2014-03-16 NOTE — Assessment & Plan Note (Signed)
Intermittent flares, uses antivert as needed

## 2014-03-16 NOTE — Assessment & Plan Note (Signed)
Controlled, no change in medication Handling passage of 2 siblings in the last 6 months well

## 2014-03-16 NOTE — Assessment & Plan Note (Signed)
Controlled, no change in medication  

## 2014-03-16 NOTE — Assessment & Plan Note (Signed)
Fungal rash in left axilla, topical prep prescribed for 10 days, then as needed, pt to keep area dry .

## 2014-03-16 NOTE — Assessment & Plan Note (Signed)
Hyperlipidemia:Low fat diet discussed and encouraged.  Controlled, no change in medication   

## 2014-03-23 ENCOUNTER — Ambulatory Visit (HOSPITAL_COMMUNITY)
Admission: RE | Admit: 2014-03-23 | Discharge: 2014-03-23 | Disposition: A | Discharge status: Home / Self Care | Payer: Medicare Other | Source: Ambulatory Visit | Attending: Family Medicine | Admitting: Family Medicine

## 2014-03-23 DIAGNOSIS — Z1231 Encounter for screening mammogram for malignant neoplasm of breast: Secondary | ICD-10-CM

## 2014-04-02 ENCOUNTER — Encounter: Payer: Self-pay | Admitting: Family Medicine

## 2014-04-02 ENCOUNTER — Ambulatory Visit (INDEPENDENT_AMBULATORY_CARE_PROVIDER_SITE_OTHER): Payer: Medicare Other | Admitting: Family Medicine

## 2014-04-02 VITALS — BP 110/64 | HR 78 | Resp 18 | Ht 67.0 in | Wt 192.0 lb

## 2014-04-02 DIAGNOSIS — R42 Dizziness and giddiness: Secondary | ICD-10-CM

## 2014-04-02 DIAGNOSIS — E785 Hyperlipidemia, unspecified: Secondary | ICD-10-CM

## 2014-04-02 DIAGNOSIS — I10 Essential (primary) hypertension: Secondary | ICD-10-CM

## 2014-04-02 DIAGNOSIS — Z0000 Encounter for general adult medical examination without abnormal findings: Secondary | ICD-10-CM | POA: Diagnosis not present

## 2014-04-02 DIAGNOSIS — Z1211 Encounter for screening for malignant neoplasm of colon: Secondary | ICD-10-CM

## 2014-04-02 DIAGNOSIS — N83201 Unspecified ovarian cyst, right side: Secondary | ICD-10-CM

## 2014-04-02 NOTE — Progress Notes (Signed)
Subjective:    Patient ID: MONICKA CYRAN, female    DOB: July 27, 1940, 74 y.o.   MRN: [id number]  HPI Preventive Screening-Counseling & Management   Patient present here today for a Medicare annual wellness visit.   Current Problems (verified)   Medications Prior to Visit Allergies (verified)   PAST HISTORY  Family History (updated)  Social History Retired Scientist, clinical (histocompatibility and immunogenetics)    Risk Factors  Current exercise habits:  As able  Dietary issues discussed: Low fat heart healthy diet   Cardiac risk factors: age   Depression Screen  (Note: if answer to either of the following is "Yes", a more complete depression screening is indicated)   Over the past two weeks, have you felt down, depressed or hopeless? No  Over the past two weeks, have you felt little interest or pleasure in doing things? No  Have you lost interest or pleasure in daily life? No  Do you often feel hopeless? No  Do you cry easily over simple problems? No   Activities of Daily Living  In your present state of health, do you have any difficulty performing the following activities?  Driving?: No Managing money?: No Feeding yourself?:No Getting from bed to chair?:No Climbing a flight of stairs?:No Preparing food and eating?:No Bathing or showering?:No Getting dressed?:No Getting to the toilet?:No Using the toilet?:No Moving around from place to place?: No  Fall Risk Assessment In the past year have you fallen or had a near fall?:No Are you currently taking any medications that make you dizziness?:No   Hearing Difficulties: No Do you often ask people to speak up or repeat themselves?:No Do you experience ringing or noises in your ears?:No Do you have difficulty understanding soft or whispered voices?:No  Cognitive Testing  Alert? Yes Normal Appearance?Yes  Oriented to person? Yes Place? Yes  Time? Yes  Displays appropriate judgment?Yes  Can read the correct time from a watch face? yes Are you having  problems remembering things?No  Advanced Directives have been discussed with the patient?Yes and brochure provided    List the Names of Other Physician/Practitioners you currently use: updated    Indicate any recent Medical Services you may have received from other than [facility] providers in the past year (date may be approximate).   Assessment:    Annual Wellness Exam   Plan:    D Medicare Attestation  I have personally reviewed:  The patient's medical and social history  Their use of alcohol, tobacco or illicit drugs  Their current medications and supplements  The patient's functional ability including ADLs,fall risks, home safety risks, cognitive, and hearing and visual impairment  Diet and physical activities  Evidence for depression or mood disorders  The patient's weight, height, BMI, and visual acuity have been recorded in the chart. I have made referrals, counseling, and provided education to the patient based on review of the above and I have provided the patient with a written personalized care plan for preventive services.      Review of Systems     Objective:   Physical Exam  BP 110/64 mmHg  Pulse 78  Resp 18  Ht 5\' 7"  (1.702 m)  Wt 192 lb (87.091 kg)  BMI 30.06 kg/m2  SpO2 96%       Assessment & Plan:  Medicare annual wellness visit, subsequent Annual exam as documented. Counseling done  re healthy lifestyle involving commitment to 150 minutes exercise per week, heart healthy diet, and attaining healthy weight.The importance of adequate sleep also discussed.  Regular seat belt use and home safety, is also discussed. Changes in health habits are decided on by the patient with goals and time frames  set for achieving them. Immunization and cancer screening needs are specifically addressed at this visit. Needs colonoscopy and also needs to call and schedule her appt with cardiology referred since last August but cancelled at the time

## 2014-04-02 NOTE — Assessment & Plan Note (Addendum)
Annual exam as documented. Counseling done  re healthy lifestyle involving commitment to 150 minutes exercise per week, heart healthy diet, and attaining healthy weight.The importance of adequate sleep also discussed. Regular seat belt use and home safety, is also discussed. Changes in health habits are decided on by the patient with goals and time frames  set for achieving them. Immunization and cancer screening needs are specifically addressed at this visit. Needs colonoscopy and also needs to call and schedule her appt with cardiology referred since last August but cancelled at the time

## 2014-04-02 NOTE — Patient Instructions (Signed)
F/u in 5.5  month   You will get tele number to call and reschedule your appt with the cardiologist in [facility] are referred to Dr Laural Golden for avg risk screening colonoscopy  Excellent labs , no changes in medication Mammogram normal  pls work on living will [place] cause injuries and can affect all age groups. It is possible to prevent falls.  HOW TO PREVENT FALLS  Wear shoes with rubber soles that do not have an opening for your toes.  Keep the inside and outside of your house well lit.  Use night lights throughout your home.  Remove clutter from floors.  Clean up floor spills.  Remove throw rugs or fasten them to the floor with carpet tape.  Do not place electrical cords across pathways.  Put grab bars by your tub, shower, and toilet. Do not use towel bars as grab bars.  Put handrails on both sides of the stairway. Fix loose handrails.  Do not climb on stools or stepladders, if possible.  Do not wax your floors.  Repair uneven or unsafe sidewalks, walkways, or stairs.  Keep items you use a lot within reach.  Be aware of pets.  Keep emergency numbers next to the telephone.  Put smoke detectors in your home and near bedrooms. Ask your doctor what other things you can do to prevent falls. Document Released: 12/06/2008 Document Revised: 08/11/2011 Document Reviewed: 05/12/2011 [facility] Patient Information 2015 [place], [place]. This information is not intended to replace advice given to you by your health care provider. Make sure you discuss any questions you have with your health care provider.

## 2014-04-03 ENCOUNTER — Encounter (INDEPENDENT_AMBULATORY_CARE_PROVIDER_SITE_OTHER): Payer: Self-pay | Admitting: *Deleted

## 2014-04-06 ENCOUNTER — Ambulatory Visit (HOSPITAL_COMMUNITY)
Admission: RE | Admit: 2014-04-06 | Discharge: 2014-04-06 | Disposition: A | Discharge status: Home / Self Care | Payer: Medicare Other | Source: Ambulatory Visit | Attending: Family Medicine | Admitting: Family Medicine

## 2014-04-06 ENCOUNTER — Ambulatory Visit (HOSPITAL_COMMUNITY): Payer: Medicare Other

## 2014-04-06 DIAGNOSIS — N8320 Unspecified ovarian cysts: Secondary | ICD-10-CM | POA: Diagnosis not present

## 2014-04-06 DIAGNOSIS — N83201 Unspecified ovarian cyst, right side: Secondary | ICD-10-CM

## 2014-04-06 DIAGNOSIS — Z09 Encounter for follow-up examination after completed treatment for conditions other than malignant neoplasm: Secondary | ICD-10-CM | POA: Insufficient documentation

## 2014-04-09 ENCOUNTER — Ambulatory Visit: Payer: Medicare Other | Admitting: Cardiovascular Disease

## 2014-04-16 ENCOUNTER — Other Ambulatory Visit: Payer: Self-pay

## 2014-04-20 ENCOUNTER — Ambulatory Visit: Payer: Medicare Other | Admitting: Cardiovascular Disease

## 2014-04-20 ENCOUNTER — Telehealth: Payer: Self-pay | Admitting: Family Medicine

## 2014-04-20 NOTE — Telephone Encounter (Signed)
1 day history of nasal congestion with white mucus. No other symptoms. Advised the use of sudafed and saline nasal spray as needed. Patient understands and agrees

## 2014-05-02 ENCOUNTER — Other Ambulatory Visit: Payer: Self-pay | Admitting: Family Medicine

## 2014-05-21 ENCOUNTER — Ambulatory Visit (INDEPENDENT_AMBULATORY_CARE_PROVIDER_SITE_OTHER): Payer: Medicare Other | Admitting: Cardiovascular Disease

## 2014-05-21 ENCOUNTER — Encounter: Payer: Self-pay | Admitting: Cardiovascular Disease

## 2014-05-21 VITALS — BP 122/90 | HR 82 | Ht 67.0 in | Wt 192.0 lb

## 2014-05-21 DIAGNOSIS — R42 Dizziness and giddiness: Secondary | ICD-10-CM | POA: Diagnosis not present

## 2014-05-21 DIAGNOSIS — E785 Hyperlipidemia, unspecified: Secondary | ICD-10-CM | POA: Diagnosis not present

## 2014-05-21 DIAGNOSIS — Z136 Encounter for screening for cardiovascular disorders: Secondary | ICD-10-CM | POA: Diagnosis not present

## 2014-05-21 DIAGNOSIS — I10 Essential (primary) hypertension: Secondary | ICD-10-CM | POA: Diagnosis not present

## 2014-05-21 NOTE — Progress Notes (Signed)
Patient ID: Theresa Garcia, female   DOB: [birth year], 74 y.o.   MRN: [id number]       CARDIOLOGY CONSULT NOTE  Patient ID: Theresa Garcia MRN: [id number] DOB/AGE: 02-04-1941 74 y.o.  Admit date: (Not on file) Primary Physician Tula Nakayama, MD  Reason for Consultation: dizziness  HPI: The patient is a 74 year old female with a history of hypertension, hyperlipidemia, depression, and vertigo. She has been referred for the evaluation of dizziness. ECG performed on 09/04/13 demonstrated normal sinus rhythm with no ischemic ST segment or T-wave abnormalities, nor any arrhythmias. She underwent CTA coronary arteries in [place], [place], on 03/07/07 which demonstrated normal coronaries.  She denies exertional chest pain. She has mild exertional dyspnea which she attributes to weight gain. She said she does not get any routine physical activity since her brother passed away in 12/16/2013 of a ruptured aorta and her sister passed away in 03/18/2014 of metastatic breast CA. Her son died of carbon monoxide poisoning in 2011.  She used to live in [place], [place], with her son after her divorce. She has bilateral varicose veins. She seldom gets dizzy and has not had to take meclizine in the recent past. She had scarlet fever as a child. He believes she is doing well.  ECG performed in the office today demonstrate normal sinus rhythm with no ischemic ST segment or T-wave abnormalities, nor any arrhythmias.    Allergies  Allergen Reactions  . Penicillins Other (See Comments)    unknown    Current Outpatient Prescriptions  Medication Sig Dispense Refill  . acyclovir (ZOVIRAX) 400 MG tablet Take 1 tablet (400 mg total) by mouth 2 (two) times daily. 60 tablet 6  . amLODipine-benazepril (LOTREL) 5-40 MG per capsule TAKE 1 CAPSULE DAILY 90 capsule 1  . amLODipine-benazepril (LOTREL) 5-40 MG per capsule TAKE 1 CAPSULE DAILY 90 capsule 0  . atorvastatin (LIPITOR) 20 MG tablet  TAKE 1 TABLET EVERY EVENING 90 tablet 1  . clotrimazole-betamethasone (LOTRISONE) cream Apply 1 application topically 2 (two) times daily. For 10 days 30 g 0  . Diclofenac-Misoprostol 75-0.2 MG TBEC     . FLUoxetine (PROZAC) 10 MG capsule Take 1 capsule (10 mg total) by mouth daily. 30 capsule 2  . meclizine (ANTIVERT) 25 MG tablet Take 1 tablet (25 mg total) by mouth 3 (three) times daily as needed for dizziness. 30 tablet 1  . temazepam (RESTORIL) 30 MG capsule Take 1 capsule (30 mg total) by mouth at bedtime as needed. 90 capsule 1  . Vitamin D, Ergocalciferol, (DRISDOL) 50000 UNITS CAPS capsule Take 1 capsule (50,000 Units total) by mouth every 7 (seven) days. 4 capsule 5   No current facility-administered medications for this visit.    Past Medical History  Diagnosis Date  . Insomnia   . Hyperlipidemia   . Hypertension   . Cancer   . Pneumonia   . GERD (gastroesophageal reflux disease)   . Bronchitis   . Kidney stone   . Depression   . Chronic back pain   . Vertigo     Past Surgical History  Procedure Laterality Date  . Abdominal hysterectomy    . Breast surgery      left  . Tubal ligation    . Cystoscopy/retrograde/ureteroscopy/stone extraction with basket  01/07/2011    Procedure: CYSTOSCOPY/RETROGRADE/URETEROSCOPY/STONE EXTRACTION WITH BASKET;  Surgeon: Marissa Nestle;  Location: [facility];  Service: Urology;  Laterality: Left;  Balloon Dilatation; stone given to family per MD  History   Social History  . Marital Status: Divorced    Spouse Name: N/A  . Number of Children: N/A  . Years of Education: N/A   Occupational History  . Not on file.   Social History Main Topics  . Smoking status: Never Smoker   . Smokeless tobacco: Not on file  . Alcohol Use: No  . Drug Use: No  . Sexual Activity: Not Currently   Other Topics Concern  . Not on file   Social History Narrative     No family history of premature CAD in 1st degree relatives.  Prior to  Admission medications   Medication Sig Start Date End Date Taking? Authorizing Provider  acyclovir (ZOVIRAX) 400 MG tablet Take 1 tablet (400 mg total) by mouth 2 (two) times daily. 03/14/14   Fayrene Helper, MD  amLODipine-benazepril (LOTREL) 5-40 MG per capsule TAKE 1 CAPSULE DAILY 11/27/13   Fayrene Helper, MD  amLODipine-benazepril (LOTREL) 5-40 MG per capsule TAKE 1 CAPSULE DAILY 05/02/14   Fayrene Helper, MD  atorvastatin (LIPITOR) 20 MG tablet TAKE 1 TABLET EVERY EVENING 11/27/13   Fayrene Helper, MD  clotrimazole-betamethasone (LOTRISONE) cream Apply 1 application topically 2 (two) times daily. For 10 days 03/14/14   Fayrene Helper, MD  Diclofenac-Misoprostol 75-0.2 MG TBEC  08/28/13   Historical Provider, MD  FLUoxetine (PROZAC) 10 MG capsule Take 1 capsule (10 mg total) by mouth daily. 07/05/13 07/05/14  Fayrene Helper, MD  meclizine (ANTIVERT) 25 MG tablet Take 1 tablet (25 mg total) by mouth 3 (three) times daily as needed for dizziness. 12/12/13   Fayrene Helper, MD  temazepam (RESTORIL) 30 MG capsule Take 1 capsule (30 mg total) by mouth at bedtime as needed. 11/27/13   Fayrene Helper, MD  Vitamin D, Ergocalciferol, (DRISDOL) 50000 UNITS CAPS capsule Take 1 capsule (50,000 Units total) by mouth every 7 (seven) days. 03/15/14   Fayrene Helper, MD     Review of systems complete and found to be negative unless listed above in HPI     Physical exam  BP 122/90  Pulse 82  SpO2 91%  Weight 192 lb (87.091 kg) Height 5\' 7"  (1.702 m)    General: NAD Neck: No JVD, no thyromegaly or thyroid nodule.  Lungs: Clear to auscultation bilaterally with normal respiratory effort. CV: Nondisplaced PMI. Regular rate and rhythm, normal S1/S2, no S3/S4, no murmur.  Mild peripheral edema.  No carotid bruit.    Abdomen: Soft, nontender, no distention.  Skin: Intact without lesions or rashes.  Neurologic: Alert and oriented x 3.  Psych: Normal affect. Extremities: No  clubbing or cyanosis.  HEENT: Normal.   ECG: Most recent ECG reviewed.  Labs:   Lab Results  Component Value Date   WBC 7.9 03/14/2014   HGB 14.0 03/14/2014   HCT 42.1 03/14/2014   MCV 92.3 03/14/2014   PLT 323 03/14/2014   No results for input(s): NA, K, CL, CO2, BUN, CREATININE, CALCIUM, PROT, BILITOT, ALKPHOS, ALT, AST, GLUCOSE in the last 168 hours.  Invalid input(s): LABALBU No results found for: CKTOTAL, CKMB, CKMBINDEX, TROPONINI  Lab Results  Component Value Date   CHOL 160 03/14/2014   CHOL 173 07/15/2012   CHOL 179 11/24/2010   Lab Results  Component Value Date   HDL 51 03/14/2014   HDL 51 07/15/2012   HDL 52 11/24/2010   Lab Results  Component Value Date   LDLCALC 85 03/14/2014   LDLCALC 103* 07/15/2012  LDLCALC 106* 11/24/2010   Lab Results  Component Value Date   TRIG 119 03/14/2014   TRIG 95 07/15/2012   TRIG 106 11/24/2010   Lab Results  Component Value Date   CHOLHDL 3.1 03/14/2014   CHOLHDL 3.4 07/15/2012   CHOLHDL 3.4 11/24/2010   No results found for: LDLDIRECT       Studies: No results found.  ASSESSMENT AND PLAN:  1. Cardiac: ECGs have been normal. CTA of coronary arteries in January 2009 was also normal. She has no exertional chest pain nor any significant exertional dyspnea. She has leg swelling from bilateral varicose veins. She has no abnormal cardiac findings on physical examination. At the present time, there is no indication to proceed with any noninvasive cardiac testing. She can follow-up as needed.   2. Essential HTN: Blood pressure is well controlled on amlodipine-benazepril 5-40 mg daily. No changes.  3. Hyperlipidemia: Well controlled on 03/14/14. Continue Lipitor 20 mg.  Dispo: f/u prn.  Signed: Kate Sable, M.D., F.A.C.C.  05/21/2014, 2:02 PM

## 2014-05-21 NOTE — Patient Instructions (Signed)
Continue all current medications. Follow up as needed  

## 2014-06-28 ENCOUNTER — Other Ambulatory Visit: Payer: Self-pay | Admitting: Family Medicine

## 2014-07-05 DIAGNOSIS — H52223 Regular astigmatism, bilateral: Secondary | ICD-10-CM | POA: Diagnosis not present

## 2014-07-05 DIAGNOSIS — H5203 Hypermetropia, bilateral: Secondary | ICD-10-CM | POA: Diagnosis not present

## 2014-07-05 DIAGNOSIS — H524 Presbyopia: Secondary | ICD-10-CM | POA: Diagnosis not present

## 2014-07-05 DIAGNOSIS — H2513 Age-related nuclear cataract, bilateral: Secondary | ICD-10-CM | POA: Diagnosis not present

## 2014-07-29 ENCOUNTER — Other Ambulatory Visit: Payer: Self-pay | Admitting: Family Medicine

## 2014-09-11 ENCOUNTER — Ambulatory Visit (INDEPENDENT_AMBULATORY_CARE_PROVIDER_SITE_OTHER): Payer: Medicare Other | Admitting: Family Medicine

## 2014-09-11 ENCOUNTER — Encounter: Payer: Self-pay | Admitting: Family Medicine

## 2014-09-11 VITALS — BP 120/84 | HR 70 | Resp 16 | Ht 67.0 in | Wt 187.0 lb

## 2014-09-11 DIAGNOSIS — E559 Vitamin D deficiency, unspecified: Secondary | ICD-10-CM

## 2014-09-11 DIAGNOSIS — I10 Essential (primary) hypertension: Secondary | ICD-10-CM

## 2014-09-11 DIAGNOSIS — R42 Dizziness and giddiness: Secondary | ICD-10-CM

## 2014-09-11 DIAGNOSIS — G4700 Insomnia, unspecified: Secondary | ICD-10-CM

## 2014-09-11 DIAGNOSIS — F418 Other specified anxiety disorders: Secondary | ICD-10-CM | POA: Diagnosis not present

## 2014-09-11 DIAGNOSIS — E785 Hyperlipidemia, unspecified: Secondary | ICD-10-CM | POA: Diagnosis not present

## 2014-09-11 DIAGNOSIS — A6004 Herpesviral vulvovaginitis: Secondary | ICD-10-CM

## 2014-09-11 NOTE — Patient Instructions (Addendum)
Annual physical in December, call if you need em before  Flu vaccines available in  Sept , pls call  Labs today, lipid, cmp and EGFR  PLS commit to daily vit D3 800IU indefinitely  Congrats regular exercise commitment and weight loss, keep it up  Pls call to sched colonoscopy  Thanks for choosing [facility], we consider it a privelige to serve you.

## 2014-09-12 DIAGNOSIS — E559 Vitamin D deficiency, unspecified: Secondary | ICD-10-CM | POA: Insufficient documentation

## 2014-09-12 LAB — COMPLETE METABOLIC PANEL WITH GFR
ALT: 18 U/L (ref 0–35)
AST: 20 U/L (ref 0–37)
Albumin: 4 g/dL (ref 3.5–5.2)
Alkaline Phosphatase: 61 U/L (ref 39–117)
BUN: 13 mg/dL (ref 6–23)
CO2: 24 mEq/L (ref 19–32)
Calcium: 10 mg/dL (ref 8.4–10.5)
Chloride: 105 mEq/L (ref 96–112)
Creat: 0.73 mg/dL (ref 0.50–1.10)
GFR, Est African American: >89 mL/min
GFR, Est Non African American: 83 mL/min
Glucose, Bld: 76 mg/dL (ref 70–99)
Potassium: 4.6 mEq/L (ref 3.5–5.3)
Sodium: 139 mEq/L (ref 135–145)
Total Bilirubin: 0.5 mg/dL (ref 0.2–1.2)
Total Protein: 7.1 g/dL (ref 6.0–8.3)

## 2014-09-12 LAB — LIPID PANEL
Cholesterol: 166 mg/dL (ref 0–200)
HDL: 51 mg/dL (ref >=46)
LDL Cholesterol: 90 mg/dL (ref 0–99)
Total CHOL/HDL Ratio: 3.3 Ratio
Triglycerides: 125 mg/dL (ref <150)
VLDL: 25 mg/dL (ref 0–40)

## 2014-09-12 NOTE — Progress Notes (Signed)
   Theresa Garcia     MRN: [id number]      DOB: March 05, 1940   HPI Theresa Garcia is here for follow up and re-evaluation of chronic medical conditions, medication management and review of any available recent lab and radiology data.  Preventive health is updated, specifically  Cancer screening and Immunization.   Questions or concerns regarding consultations or procedures which the PT has had in the interim are  addressed. The PT denies any adverse reactions to current medications since the last visit.  There are no new concerns.  There are no specific complaints   ROS Denies recent fever or chills. Denies sinus pressure, nasal congestion, ear pain or sore throat. Denies chest congestion, productive cough or wheezing. Denies chest pains, palpitations and leg swelling Denies abdominal pain, nausea, vomiting,diarrhea or constipation.   Denies dysuria, frequency, hesitancy or incontinence. Denies joint pain, swelling and limitation in mobility. Denies headaches, seizures, numbness, or tingling. Denies depression, anxiety or insomnia. Denies skin break down or rash.   PE  BP 120/84 mmHg  Pulse 70  Resp 16  Ht 5\' 7"  (1.702 m)  Wt 187 lb (84.823 kg)  BMI 29.28 kg/m2  SpO2 98%  Patient alert and oriented and in no cardiopulmonary distress.  HEENT: No facial asymmetry, EOMI,   oropharynx pink and moist.  Neck supple no JVD, no mass.  Chest: Clear to auscultation bilaterally.  CVS: S1, S2 no murmurs, no S3.Regular rate.  ABD: Soft non tender.   Ext: No edema  MS: Adequate ROM spine, shoulders, hips and knees.  Skin: Intact, no ulcerations or rash noted.  Psych: Good eye contact, normal affect. Memory intact not anxious or depressed appearing.  CNS: CN 2-12 intact, power,  normal throughout.no focal deficits noted.   Assessment & Plan   Essential hypertension Controlled, no change in medication DASH diet and commitment to daily physical activity for a minimum of 30  minutes discussed and encouraged, as a part of hypertension management. The importance of attaining a healthy weight is also discussed.  BP/Weight 09/11/2014 05/21/2014 04/02/2014 03/14/2014 11/27/2013 09/07/2013 1/70/0174  Systolic BP 944 967 591 638 466 599 357  Diastolic BP 84 90 64 80 82 74 86  Wt. (Lbs) 187 192 192 191.12 187 189.12 186  BMI 29.28 30.06 30.06 29.93 29.28 29.61 31.91        Depression with anxiety imropved and controlled, has started daily exercise at the [facility] which has also helped , no med change  Vertigo Asymptomatic for the past 4 months, essentially resolved  Insomnia Sleep hygiene reviewed and written information offered also. Prescription sent for  medication needed.   Type 2 HSV infection of vulvovaginal region Controlled, no change in medication   Hyperlipidemia Controlled, no change in medication Hyperlipidemia:Low fat diet discussed and encouraged.   Lipid Panel  Lab Results  Component Value Date   CHOL 166 09/11/2014   HDL 51 09/11/2014   LDLCALC 90 09/11/2014   TRIG 125 09/11/2014   CHOLHDL 3.3 09/11/2014        Vitamin D deficiency Marked deficiency. Patient electing to buy OTC once daily vitamin D3 tablet

## 2014-09-12 NOTE — Assessment & Plan Note (Signed)
Controlled, no change in medication DASH diet and commitment to daily physical activity for a minimum of 30 minutes discussed and encouraged, as a part of hypertension management. The importance of attaining a healthy weight is also discussed.  BP/Weight 09/11/2014 05/21/2014 04/02/2014 03/14/2014 11/27/2013 09/07/2013 03/04/2109  Systolic BP 735 670 141 030 131 438 887  Diastolic BP 84 90 64 80 82 74 86  Wt. (Lbs) 187 192 192 191.12 187 189.12 186  BMI 29.28 30.06 30.06 29.93 29.28 29.61 31.91

## 2014-09-12 NOTE — Assessment & Plan Note (Signed)
Controlled, no change in medication Hyperlipidemia:Low fat diet discussed and encouraged.   Lipid Panel  Lab Results  Component Value Date   CHOL 166 09/11/2014   HDL 51 09/11/2014   LDLCALC 90 09/11/2014   TRIG 125 09/11/2014   CHOLHDL 3.3 09/11/2014

## 2014-09-12 NOTE — Assessment & Plan Note (Signed)
imropved and controlled, has started daily exercise at the [facility] which has also helped , no med change

## 2014-09-12 NOTE — Assessment & Plan Note (Signed)
Controlled, no change in medication  

## 2014-09-12 NOTE — Assessment & Plan Note (Signed)
Sleep hygiene reviewed and written information offered also. Prescription sent for  medication needed.  

## 2014-09-12 NOTE — Assessment & Plan Note (Signed)
Asymptomatic for the past 4 months, essentially resolved

## 2014-09-12 NOTE — Assessment & Plan Note (Signed)
Marked deficiency. Patient electing to buy OTC once daily vitamin D3 tablet

## 2014-09-13 ENCOUNTER — Telehealth: Payer: Self-pay | Admitting: *Deleted

## 2014-09-13 NOTE — Telephone Encounter (Signed)
Pt called this morning stating that she got a called yesterday from the office. Pt is returning the call. Patient is requesting a call back.

## 2014-09-13 NOTE — Telephone Encounter (Signed)
Patient aware of lab results.

## 2014-09-25 ENCOUNTER — Other Ambulatory Visit: Payer: Self-pay | Admitting: Family Medicine

## 2014-09-27 ENCOUNTER — Telehealth: Payer: Self-pay | Admitting: *Deleted

## 2014-09-27 NOTE — Telephone Encounter (Signed)
Pt called stating she has caught her sister's cold, pt would like something for cough, pt is requesting the same medication Dr. Moshe Cipro put her sister on for the cough, pt is not sure of the name. Pt wants this called into the [facility] in [place]

## 2014-10-02 MED ORDER — BENZONATATE 100 MG PO CAPS: 100.0000 mg | ORAL_CAPSULE | Freq: Two times a day (BID) | ORAL | Status: DC | PRN

## 2014-10-02 NOTE — Telephone Encounter (Signed)
Called and left message for patient to return call.  

## 2014-10-02 NOTE — Telephone Encounter (Signed)
Spoke with patient and she states that she is having productive cough.    Per verbal order Tessalon Perles 100mg  bid prn #14 prescribed and sent to [facility]

## 2014-11-01 ENCOUNTER — Other Ambulatory Visit: Payer: Self-pay | Admitting: Family Medicine

## 2014-11-19 ENCOUNTER — Other Ambulatory Visit: Payer: Self-pay | Admitting: Family Medicine

## 2014-12-10 ENCOUNTER — Ambulatory Visit (INDEPENDENT_AMBULATORY_CARE_PROVIDER_SITE_OTHER): Payer: Medicare Other | Admitting: Family Medicine

## 2014-12-10 ENCOUNTER — Encounter: Payer: Self-pay | Admitting: Family Medicine

## 2014-12-10 VITALS — BP 122/70 | HR 70 | Resp 18 | Ht 67.0 in | Wt 187.0 lb

## 2014-12-10 DIAGNOSIS — E785 Hyperlipidemia, unspecified: Secondary | ICD-10-CM | POA: Diagnosis not present

## 2014-12-10 DIAGNOSIS — I10 Essential (primary) hypertension: Secondary | ICD-10-CM

## 2014-12-10 DIAGNOSIS — F418 Other specified anxiety disorders: Secondary | ICD-10-CM

## 2014-12-10 DIAGNOSIS — J3089 Other allergic rhinitis: Secondary | ICD-10-CM

## 2014-12-10 DIAGNOSIS — H8110 Benign paroxysmal vertigo, unspecified ear: Secondary | ICD-10-CM | POA: Diagnosis not present

## 2014-12-10 DIAGNOSIS — G4700 Insomnia, unspecified: Secondary | ICD-10-CM

## 2014-12-10 DIAGNOSIS — Z23 Encounter for immunization: Secondary | ICD-10-CM

## 2014-12-10 MED ORDER — PSEUDOEPHEDRINE HCL 30 MG PO TABS: ORAL_TABLET | ORAL | Status: DC

## 2014-12-10 MED ORDER — LORATADINE 10 MG PO TABS: 10.0000 mg | ORAL_TABLET | Freq: Every day | ORAL | Status: DC

## 2014-12-10 MED ORDER — TEMAZEPAM 30 MG PO CAPS: 30.0000 mg | ORAL_CAPSULE | Freq: Every evening | ORAL | Status: DC | PRN

## 2014-12-10 NOTE — Progress Notes (Signed)
   Subjective:    Patient ID: Theresa Garcia, female    DOB: 1940/12/25, 74 y.o.   MRN: [id number]  HPI   Theresa Garcia     MRN: [id number]      DOB: [birth year]   HPI Theresa Garcia is here fwith a 2 week h/o vertigo, causing her to have to sit and rest, she is intolerant of meclizine, states it bothers her stomach, has benefitied in the past from ENT eval, also has  re-evaluation of chronic medical conditions, medication management and review of any available recent lab and radiology data.  Preventive health is updated, specifically  Cancer screening and Immunization.   . The PT denies any adverse reactions to current medications since the last visit.  ROS Denies recent fever or chills. C/o increased  sinus pressure, and nsal congestion, does have some ear pressure, denies sore throat Denies chest congestion, productive cough or wheezing. Denies chest pain, palpitation, PND, orthopnea or ;leg swelling Denies abdominal pain, nausea, vomiting,diarrhea or constipation.   Denies dysuria, frequency, hesitancy or incontinence. Denies joint pain, swelling and limitation in mobility. Denies headaches, seizures, numbness, or tingling. Denies uncontrolled depression, anxiety or insomnia. Denies skin break down or rash.   PE  BP 122/70 mmHg  Pulse 70  Resp 18  Ht 5\' 7"  (1.702 m)  Wt 187 lb 0.6 oz (84.841 kg)  BMI 29.29 kg/m2  SpO2 98%  Patient alert and oriented and in no cardiopulmonary distress.  HEENT: No facial asymmetry, EOMI,   oropharynx pink and moist.  Neck supple no JVD, no mass.No nystagmus  Chest: Clear to auscultation bilaterally.  CVS: S1, S2 no murmurs, no S3.Regular rate.  ABD: Soft non tender.   Ext: No edema  MS: Adequate ROM spine, shoulders, hips and knees.  Skin: Intact, no ulcerations or rash noted.  Psych: Good eye contact, normal affect. Memory intact not anxious or depressed appearing.  CNS: CN 2-12 intact, power,  normal throughout.no focal  deficits noted.   Assessment & Plan   Vertigo 2 week history , intolerant of meclizine, persistent, refer to ENT  Allergic rhinitis Uncontrolled , start daily oral med  Essential hypertension Controlled, no change in medication DASH diet and commitment to daily physical activity for a minimum of 30 minutes discussed and encouraged, as a part of hypertension management. The importance of attaining a healthy weight is also discussed.  BP/Weight 12/10/2014 09/11/2014 05/21/2014 04/02/2014 03/14/2014 11/27/2013 10/17/2351  Systolic BP 614 431 540 086 761 950 932  Diastolic BP 70 84 90 64 80 82 74  Wt. (Lbs) 187.04 187 192 192 191.12 187 189.12  BMI 29.29 29.28 30.06 30.06 29.93 29.28 29.61        Hyperlipidemia Hyperlipidemia:Low fat diet discussed and encouraged.   Lipid Panel  Lab Results  Component Value Date   CHOL 166 09/11/2014   HDL 51 09/11/2014   LDLCALC 90 09/11/2014   TRIG 125 09/11/2014   CHOLHDL 3.3 09/11/2014   Controlled, no change in medication      Depression with anxiety Controlled, no change in medication   Insomnia Sleep hygiene reviewed and written information offered also. Prescription sent for  medication needed.        Review of Systems     Objective:   Physical Exam        Assessment & Plan:

## 2014-12-10 NOTE — Patient Instructions (Addendum)
F/u as before , call if you need me sooner  Flu vaccine today  You are referred to Dr Benjamine Mola for vertigo,  [place] office  Loratidine is sent in for allergies, and sudafed for ear pressure ,take as directed  PLS call and sched appt for colonoscopy, you need this   Restoril will be refilled.

## 2014-12-15 NOTE — Assessment & Plan Note (Signed)
Controlled, no change in medication DASH diet and commitment to daily physical activity for a minimum of 30 minutes discussed and encouraged, as a part of hypertension management. The importance of attaining a healthy weight is also discussed.  BP/Weight 12/10/2014 09/11/2014 05/21/2014 04/02/2014 03/14/2014 11/27/2013 06/12/9142  Systolic BP 458 483 507 573 225 672 091  Diastolic BP 70 84 90 64 80 82 74  Wt. (Lbs) 187.04 187 192 192 191.12 187 189.12  BMI 29.29 29.28 30.06 30.06 29.93 29.28 29.61

## 2014-12-15 NOTE — Assessment & Plan Note (Signed)
Hyperlipidemia:Low fat diet discussed and encouraged.   Lipid Panel  Lab Results  Component Value Date   CHOL 166 09/11/2014   HDL 51 09/11/2014   LDLCALC 90 09/11/2014   TRIG 125 09/11/2014   CHOLHDL 3.3 09/11/2014   Controlled, no change in medication

## 2014-12-15 NOTE — Assessment & Plan Note (Signed)
Controlled, no change in medication  

## 2014-12-15 NOTE — Assessment & Plan Note (Signed)
Uncontrolled , start daily oral med

## 2014-12-15 NOTE — Assessment & Plan Note (Signed)
2 week history , intolerant of meclizine, persistent, refer to ENT

## 2014-12-15 NOTE — Assessment & Plan Note (Signed)
Sleep hygiene reviewed and written information offered also. Prescription sent for  medication needed.  

## 2014-12-17 DIAGNOSIS — Z23 Encounter for immunization: Secondary | ICD-10-CM | POA: Diagnosis not present

## 2015-01-18 DIAGNOSIS — R109 Unspecified abdominal pain: Secondary | ICD-10-CM | POA: Diagnosis not present

## 2015-01-21 DIAGNOSIS — K5900 Constipation, unspecified: Secondary | ICD-10-CM | POA: Diagnosis not present

## 2015-01-21 DIAGNOSIS — I10 Essential (primary) hypertension: Secondary | ICD-10-CM | POA: Diagnosis not present

## 2015-01-21 DIAGNOSIS — Z87442 Personal history of urinary calculi: Secondary | ICD-10-CM | POA: Diagnosis not present

## 2015-01-21 DIAGNOSIS — Z853 Personal history of malignant neoplasm of breast: Secondary | ICD-10-CM | POA: Diagnosis not present

## 2015-01-21 DIAGNOSIS — Z79899 Other long term (current) drug therapy: Secondary | ICD-10-CM | POA: Diagnosis not present

## 2015-01-21 DIAGNOSIS — R109 Unspecified abdominal pain: Secondary | ICD-10-CM | POA: Diagnosis not present

## 2015-01-21 DIAGNOSIS — R1032 Left lower quadrant pain: Secondary | ICD-10-CM | POA: Diagnosis not present

## 2015-01-25 ENCOUNTER — Other Ambulatory Visit: Payer: Self-pay | Admitting: Family Medicine

## 2015-01-29 ENCOUNTER — Encounter: Payer: Self-pay | Admitting: Family Medicine

## 2015-01-29 ENCOUNTER — Ambulatory Visit (INDEPENDENT_AMBULATORY_CARE_PROVIDER_SITE_OTHER): Payer: Medicare Other | Admitting: Family Medicine

## 2015-01-29 ENCOUNTER — Ambulatory Visit (HOSPITAL_COMMUNITY)
Admission: RE | Admit: 2015-01-29 | Discharge: 2015-01-29 | Disposition: A | Discharge status: Home / Self Care | Payer: Medicare Other | Source: Ambulatory Visit | Attending: Family Medicine | Admitting: Family Medicine

## 2015-01-29 VITALS — BP 118/78 | HR 82 | Resp 16 | Ht 67.0 in | Wt 193.0 lb

## 2015-01-29 DIAGNOSIS — J3089 Other allergic rhinitis: Secondary | ICD-10-CM

## 2015-01-29 DIAGNOSIS — I10 Essential (primary) hypertension: Secondary | ICD-10-CM | POA: Diagnosis not present

## 2015-01-29 DIAGNOSIS — M5410 Radiculopathy, site unspecified: Secondary | ICD-10-CM

## 2015-01-29 DIAGNOSIS — M47896 Other spondylosis, lumbar region: Secondary | ICD-10-CM | POA: Insufficient documentation

## 2015-01-29 DIAGNOSIS — F418 Other specified anxiety disorders: Secondary | ICD-10-CM | POA: Diagnosis not present

## 2015-01-29 DIAGNOSIS — M545 Low back pain: Secondary | ICD-10-CM | POA: Diagnosis present

## 2015-01-29 DIAGNOSIS — G4700 Insomnia, unspecified: Secondary | ICD-10-CM

## 2015-01-29 DIAGNOSIS — M5032 Other cervical disc degeneration, mid-cervical region, unspecified level: Secondary | ICD-10-CM | POA: Diagnosis not present

## 2015-01-29 DIAGNOSIS — M549 Dorsalgia, unspecified: Secondary | ICD-10-CM | POA: Insufficient documentation

## 2015-01-29 MED ORDER — PREDNISONE 5 MG (21) PO TBPK: ORAL_TABLET | ORAL | Status: DC

## 2015-01-29 MED ORDER — AMLODIPINE BESY-BENAZEPRIL HCL 5-40 MG PO CAPS: 1.0000 | ORAL_CAPSULE | Freq: Every day | ORAL | Status: DC

## 2015-01-29 MED ORDER — METHYLPREDNISOLONE ACETATE 80 MG/ML IJ SUSP
80.0000 mg | Freq: Once | INTRAMUSCULAR | Status: AC
  Administered 2015-01-29: 80 mg via INTRAMUSCULAR

## 2015-01-29 MED ORDER — LORATADINE 10 MG PO TABS: 10.0000 mg | ORAL_TABLET | Freq: Every day | ORAL | Status: DC

## 2015-01-29 MED ORDER — TEMAZEPAM 30 MG PO CAPS: 30.0000 mg | ORAL_CAPSULE | Freq: Every evening | ORAL | Status: DC | PRN

## 2015-01-29 MED ORDER — KETOROLAC TROMETHAMINE 60 MG/2ML IM SOLN
60.0000 mg | Freq: Once | INTRAMUSCULAR | Status: AC
  Administered 2015-01-29: 60 mg via INTRAMUSCULAR

## 2015-01-29 MED ORDER — ATORVASTATIN CALCIUM 20 MG PO TABS: 20.0000 mg | ORAL_TABLET | Freq: Every evening | ORAL | Status: DC

## 2015-01-29 MED ORDER — IBUPROFEN 800 MG PO TABS: 800.0000 mg | ORAL_TABLET | Freq: Two times a day (BID) | ORAL | Status: DC | PRN

## 2015-01-29 NOTE — Assessment & Plan Note (Addendum)
Decreased sensation and grade 4 power in left thigh, anti inflammatories in office followed by short oral course , if persists or worsens , needs MRI

## 2015-01-29 NOTE — Progress Notes (Signed)
   Subjective:    Patient ID: Theresa Garcia, female    DOB: [birth year], 74 y.o.   MRN: [id number]   HPI 3 week h/o low back pain radiating to left hip buttock and groin.c/o weakness and numbness in left thigh Was treated in ED for constipation, despite relief her pain is unchanged, also went to urgent care. Has established h/o spine disease received epidural in the past. No aggravating or relieving factors, Has experienced similar in the past Still needs colonoscopy  Review of Systems See HPI Denies recent fever or chills. Denies sinus pressure, nasal congestion, ear pain or sore throat. Denies chest congestion, productive cough or wheezing. Denies chest pains, palpitations and leg swelling Denies abdominal pain, nausea, vomiting,diarrhea or constipation.   Denies dysuria, frequency, hesitancy or incontinence. Denies headaches, seizures, . Denies  Uncontrolled depression, anxiety or insomnia. Denies skin break down or rash.        Objective:   Physical Exam  BP 118/78 mmHg  Pulse 82  Resp 16  Ht 5\' 7"  (1.702 m)  Wt 193 lb (87.544 kg)  BMI 30.22 kg/m2  SpO2 96% Patient alert and oriented and in no cardiopulmonary distress.  HEENT: No facial asymmetry, EOMI,   oropharynx pink and moist.  Neck supple no JVD, no mass.  Chest: Clear to auscultation bilaterally.  CVS: S1, S2 no murmurs, no S3.Regular rate.  ABD: Soft no localized tenderness, guarding or rebound  Ext: No edema  MS: decrease  RO lumbar  Spine,adequate in  shoulders, hips and knees.  Skin: Intact, no ulcerations or rash noted.  Psych: Good eye contact, normal affect. Memory intact not anxious or depressed appearing.  CNS: CN 2-12 intact,grade  Power in left thigh, otherwise normal in the other 3 extremities,also decreased sensation in left thigh, otherwise normal sensation in the other 3 extremities        Assessment & Plan:  Back pain with left-sided radiculopathy Decreased sensation and grade  4 power in left thigh, anti inflammatories in office followed by short oral course , if persists or worsens , needs MRI  Essential hypertension Controlled, no change in medication DASH diet and commitment to daily physical activity for a minimum of 30 minutes discussed and encouraged, as a part of hypertension management. The importance of attaining a healthy weight is also discussed.  BP/Weight 01/29/2015 12/10/2014 09/11/2014 05/21/2014 04/02/2014 03/14/2014 [id number]  Systolic BP 123456 [id number] 123456 [id number]  Diastolic BP 78 70 84 90 64 80 82  Wt. (Lbs) 193 187.04 187 192 192 191.12 187  BMI 30.22 29.29 29.28 30.06 30.06 29.93 29.28        Depression with anxiety Controlled, no change in medication   Insomnia Sleep hygiene reviewed and written information offered also. Prescription sent for  medication needed.

## 2015-01-29 NOTE — Patient Instructions (Signed)
F/u as before  You are treated for back pain with arthritis causing nerve irritation  Injections in office and medication is sent in  Call in 3 to 5 days if no improvement , I will attempt to get an MRI  If needed.  I hope and believe that the treatment  today will be sufficient  X ray of low back today  Pls follow through on colonoscopy

## 2015-02-03 NOTE — Assessment & Plan Note (Signed)
Sleep hygiene reviewed and written information offered also. Prescription sent for  medication needed.  

## 2015-02-03 NOTE — Assessment & Plan Note (Signed)
Controlled, no change in medication  

## 2015-02-03 NOTE — Assessment & Plan Note (Signed)
Controlled, no change in medication DASH diet and commitment to daily physical activity for a minimum of 30 minutes discussed and encouraged, as a part of hypertension management. The importance of attaining a healthy weight is also discussed.  BP/Weight 01/29/2015 12/10/2014 09/11/2014 05/21/2014 04/02/2014 03/14/2014 [id number]  Systolic BP 123456 [id number] 123456 [id number]  Diastolic BP 78 70 84 90 64 80 82  Wt. (Lbs) 193 187.04 187 192 192 191.12 187  BMI 30.22 29.29 29.28 30.06 30.06 29.93 29.28

## 2015-02-05 ENCOUNTER — Other Ambulatory Visit: Payer: Self-pay | Admitting: Family Medicine

## 2015-02-05 ENCOUNTER — Encounter: Payer: Self-pay | Admitting: Family Medicine

## 2015-02-12 ENCOUNTER — Encounter: Payer: Self-pay | Admitting: Family Medicine

## 2015-02-12 ENCOUNTER — Ambulatory Visit (INDEPENDENT_AMBULATORY_CARE_PROVIDER_SITE_OTHER): Payer: Medicare Other | Admitting: Family Medicine

## 2015-02-12 VITALS — BP 108/72 | HR 86 | Resp 16 | Ht 67.0 in | Wt 190.0 lb

## 2015-02-12 DIAGNOSIS — E785 Hyperlipidemia, unspecified: Secondary | ICD-10-CM | POA: Diagnosis not present

## 2015-02-12 DIAGNOSIS — E559 Vitamin D deficiency, unspecified: Secondary | ICD-10-CM

## 2015-02-12 DIAGNOSIS — I10 Essential (primary) hypertension: Secondary | ICD-10-CM | POA: Diagnosis not present

## 2015-02-12 DIAGNOSIS — A6004 Herpesviral vulvovaginitis: Secondary | ICD-10-CM

## 2015-02-12 DIAGNOSIS — G4700 Insomnia, unspecified: Secondary | ICD-10-CM

## 2015-02-12 DIAGNOSIS — J3089 Other allergic rhinitis: Secondary | ICD-10-CM

## 2015-02-12 DIAGNOSIS — F418 Other specified anxiety disorders: Secondary | ICD-10-CM

## 2015-02-12 DIAGNOSIS — M5410 Radiculopathy, site unspecified: Secondary | ICD-10-CM

## 2015-02-12 MED ORDER — ERGOCALCIFEROL 1.25 MG (50000 UT) PO CAPS: 50000.0000 [IU] | ORAL_CAPSULE | ORAL | Status: DC

## 2015-02-12 NOTE — Patient Instructions (Signed)
Annual wellness in 4 month, call if you need me before  Fasting lipid, cmp, TSH, cBC  Jan 21 or after  PLEASE commit to once weekly vitmin D capsules , this is prescribed for next 12 month  All the best  For 2017  Thanks for choosing [facility], we consider it a privelige to serve you.    Thankful that your pain is improved

## 2015-02-12 NOTE — Progress Notes (Signed)
Subjective:    Patient ID: Theresa Garcia, female    DOB: Jul 18, 1940, 74 y.o.   MRN: [id number]  HPI   Theresa Garcia     MRN: [id number]      DOB: 10-23-1940   HPI Theresa Garcia is here for follow up and re-evaluation of chronic medical conditions, medication management and review of any available recent lab and radiology data.  Preventive health is updated, specifically  Cancer screening and Immunization.   Questions or concerns regarding consultations or procedures which the PT has had in the interim are  addressed. The PT denies any adverse reactions to current medications since the last visit.  There are no new concerns.  Reports resolution of lower back and abdominal pain after last treatment  Increased stress and anxiety due to family stress  ROS Denies recent fever or chills. Denies sinus pressure, nasal congestion, ear pain or sore throat. Denies chest congestion, productive cough or wheezing. Denies chest pains, palpitations and leg swelling Denies abdominal pain, nausea, vomiting,diarrhea or constipation.   Denies dysuria, frequency, hesitancy or incontinence. Denies joint pain, swelling and limitation in mobility. Denies headaches, seizures, numbness, or tingling. Denies depression, anxiety or insomnia. Denies skin break down or rash.   PE  BP 108/72 mmHg  Pulse 86  Resp 16  Ht 5\' 7"  (1.702 m)  Wt 190 lb (86.183 kg)  BMI 29.75 kg/m2  SpO2 96%  Patient alert and oriented and in no cardiopulmonary distress.  HEENT: No facial asymmetry, EOMI,   oropharynx pink and moist.  Neck supple no JVD, no mass.  Chest: Clear to auscultation bilaterally.  CVS: S1, S2 no murmurs, no S3.Regular rate.  ABD: Soft non tender.   Ext: No edema  MS: Adequate ROM spine, shoulders, hips and knees.  Skin: Intact, no ulcerations or rash noted.  Psych: Good eye contact, normal affect. Memory intact not anxious or depressed appearing.  CNS: CN 2-12 intact, power,   normal throughout.no focal deficits noted.   Assessment & Plan   Essential hypertension Controlled, no change in medication DASH diet and commitment to daily physical activity for a minimum of 30 minutes discussed and encouraged, as a part of hypertension management. The importance of attaining a healthy weight is also discussed.  BP/Weight 02/12/2015 01/29/2015 12/10/2014 09/11/2014 05/21/2014 04/02/2014 [id number]  Systolic BP 123XX123 123456 123XX123 123456 [id number]  Diastolic BP 72 78 70 84 90 64 80  Wt. (Lbs) 190 193 187.04 187 192 192 191.12  BMI 29.75 30.22 29.29 29.28 30.06 30.06 29.93        Allergic rhinitis Increased nasal drainage in poast 2 weeks, advised intermittent use of sudafed and daily loratidine  Back pain with left-sided radiculopathy Resolved following recent course of anti inflammatories  Type 2 HSV infection of vulvovaginal region Controlled with use of daily prophylaxis  Depression with anxiety Increased anxiety with new family stress, however, appears to be coping well currently No med change  Hyperlipidemia Controlled, no change in medication Hyperlipidemia:Low fat diet discussed and encouraged.   Lipid Panel  Lab Results  Component Value Date   CHOL 166 09/11/2014   HDL 51 09/11/2014   LDLCALC 90 09/11/2014   TRIG 125 09/11/2014   CHOLHDL 3.3 09/11/2014   Updated lab needed at/ before next visit.      Insomnia Sleep hygiene reviewed and written information offered also. Prescription sent for  medication needed. Controlled, no change in medication   Vitamin D deficiency Importance of supplementation to  correct deficit is discussed, she will commit to weekly vit D      Review of Systems     Objective:   Physical Exam        Assessment & Plan:

## 2015-02-18 NOTE — Assessment & Plan Note (Signed)
Importance of supplementation to correct deficit is discussed, she will commit to weekly vit D

## 2015-02-18 NOTE — Assessment & Plan Note (Signed)
Resolved following recent course of anti inflammatories

## 2015-02-18 NOTE — Assessment & Plan Note (Signed)
Increased anxiety with new family stress, however, appears to be coping well currently No med change

## 2015-02-18 NOTE — Assessment & Plan Note (Signed)
Controlled, no change in medication Hyperlipidemia:Low fat diet discussed and encouraged.   Lipid Panel  Lab Results  Component Value Date   CHOL 166 09/11/2014   HDL 51 09/11/2014   LDLCALC 90 09/11/2014   TRIG 125 09/11/2014   CHOLHDL 3.3 09/11/2014   Updated lab needed at/ before next visit.

## 2015-02-18 NOTE — Assessment & Plan Note (Signed)
Controlled, no change in medication DASH diet and commitment to daily physical activity for a minimum of 30 minutes discussed and encouraged, as a part of hypertension management. The importance of attaining a healthy weight is also discussed.  BP/Weight 02/12/2015 01/29/2015 12/10/2014 09/11/2014 05/21/2014 04/02/2014 [id number]  Systolic BP 123XX123 123456 123XX123 123456 [id number]  Diastolic BP 72 78 70 84 90 64 80  Wt. (Lbs) 190 193 187.04 187 192 192 191.12  BMI 29.75 30.22 29.29 29.28 30.06 30.06 29.93

## 2015-02-18 NOTE — Assessment & Plan Note (Signed)
Sleep hygiene reviewed and written information offered also. Prescription sent for  medication needed. Controlled, no change in medication  

## 2015-02-18 NOTE — Assessment & Plan Note (Signed)
Increased nasal drainage in poast 2 weeks, advised intermittent use of sudafed and daily loratidine

## 2015-02-18 NOTE — Assessment & Plan Note (Signed)
Controlled with use of daily prophylaxis

## 2015-02-25 ENCOUNTER — Other Ambulatory Visit: Payer: Self-pay | Admitting: Family Medicine

## 2015-03-18 DIAGNOSIS — J329 Chronic sinusitis, unspecified: Secondary | ICD-10-CM | POA: Diagnosis not present

## 2015-03-19 ENCOUNTER — Encounter: Payer: Self-pay | Admitting: Family Medicine

## 2015-03-19 ENCOUNTER — Ambulatory Visit (INDEPENDENT_AMBULATORY_CARE_PROVIDER_SITE_OTHER): Payer: Medicare Other | Admitting: Family Medicine

## 2015-03-19 VITALS — BP 124/74 | HR 84 | Temp 98.5°F | Resp 18 | Ht 67.0 in | Wt 196.1 lb

## 2015-03-19 DIAGNOSIS — R058 Other specified cough: Secondary | ICD-10-CM

## 2015-03-19 DIAGNOSIS — I10 Essential (primary) hypertension: Secondary | ICD-10-CM | POA: Diagnosis not present

## 2015-03-19 DIAGNOSIS — R05 Cough: Secondary | ICD-10-CM | POA: Diagnosis not present

## 2015-03-19 DIAGNOSIS — J3089 Other allergic rhinitis: Secondary | ICD-10-CM | POA: Diagnosis not present

## 2015-03-19 MED ORDER — PREDNISONE 5 MG PO TABS: ORAL_TABLET | ORAL | Status: DC

## 2015-03-19 MED ORDER — METHYLPREDNISOLONE ACETATE 80 MG/ML IJ SUSP
80.0000 mg | Freq: Once | INTRAMUSCULAR | Status: AC
  Administered 2015-03-19: 80 mg via INTRAMUSCULAR

## 2015-03-19 MED ORDER — MONTELUKAST SODIUM 10 MG PO TABS: 10.0000 mg | ORAL_TABLET | Freq: Every day | ORAL | Status: DC

## 2015-03-19 MED ORDER — PROMETHAZINE-DM 6.25-15 MG/5ML PO SYRP: 5.0000 mL | ORAL_SOLUTION | Freq: Every evening | ORAL | Status: DC | PRN

## 2015-03-19 NOTE — Patient Instructions (Addendum)
F/u as before, call if you need me sooner  Please complete azithromycin as prescribed  Depo medrol IM in office for allergic cough, prednisone and singulair and cough suppressant prescribed.  Call end of the week if not better for CXR, no need for that at this time, lungs are clear  Hope yoou feel better soon

## 2015-03-28 ENCOUNTER — Telehealth: Payer: Self-pay | Admitting: Family Medicine

## 2015-03-28 DIAGNOSIS — R058 Other specified cough: Secondary | ICD-10-CM

## 2015-03-28 DIAGNOSIS — R05 Cough: Secondary | ICD-10-CM

## 2015-03-28 NOTE — Telephone Encounter (Signed)
If has sinus drainage pls order sinus x ray and also order CXr and sputum c/s, let her knwo with no fever, unless clear evidence of infection I recommend fluids and robitussin DM

## 2015-03-28 NOTE — Telephone Encounter (Signed)
Patient is still having a terrible cough, coughing up colored yellowish mucos, please advise?

## 2015-03-28 NOTE — Telephone Encounter (Signed)
Patent aware and orders placed.  Will have imaging done at [facility] and sister collected sputum culture materials.

## 2015-03-28 NOTE — Telephone Encounter (Signed)
Patient states that she has no fever/chills.   No facial pain.  Completed all previously prescribed medications.  Please advise.

## 2015-03-29 DIAGNOSIS — R05 Cough: Secondary | ICD-10-CM | POA: Diagnosis not present

## 2015-03-31 LAB — RESPIRATORY CULTURE OR RESPIRATORY AND SPUTUM CULTURE
Gram Stain: NONE SEEN
Gram Stain: NONE SEEN
Organism ID, Bacteria: NORMAL

## 2015-04-04 MED ORDER — BENZONATATE 100 MG PO CAPS: 100.0000 mg | ORAL_CAPSULE | Freq: Two times a day (BID) | ORAL | Status: DC | PRN

## 2015-04-04 NOTE — Addendum Note (Signed)
Addended by: Denman George B on: 04/04/2015 04:03 PM   Modules accepted: Orders

## 2015-04-07 NOTE — Progress Notes (Signed)
   Subjective:    Patient ID: Theresa Garcia, female    DOB: 1940-12-16, 75 y.o.   MRN: [id number]  HPI 1 week h/o cough productive of white sputum, no fever , intermittent chills, no sinus pressure, ear pain or sore throat. Seen in urgent care and started on z pack which she is currently taking Denies vertigo, does have some light headedness with the cough  Review of Systems See HPI  Denies chest pains, palpitations and leg swelling Denies abdominal pain, nausea, vomiting,diarrhea or constipation.   Denies dysuria, frequency, hesitancy or incontinence. Denies joint pain, swelling and limitation in mobility. Denies headaches, seizures, numbness, or tingling. Denies uncontrolled depression, anxiety or insomnia. Denies skin break down or rash.        Objective:   Physical Exam BP 124/74 mmHg  Pulse 84  Temp(Src) 98.5 F (36.9 C)  Resp 18  Ht 5\' 7"  (1.702 m)  Wt 196 lb 1.9 oz (88.959 kg)  BMI 30.71 kg/m2  SpO2 97% Patient alert and oriented and in no cardiopulmonary distress.  HEENT: No facial asymmetry, EOMI,   oropharynx pink and moist.  Neck supple no JVD, no mass. No sinus tenderness, erythema of nasal mucosa and edema with excess mucoid nasal drainage Chest: Clear to auscultation bilaterally.  CVS: S1, S2 no murmurs, no S3.Regular rate.  ABD: Soft non tender.   Ext: No edema  MS: Adequate ROM spine, shoulders, hips and knees.  Skin: Intact, no ulcerations or rash noted.  Psych: Good eye contact, normal affect. Memory intact not anxious or depressed appearing.  CNS: CN 2-12 intact, power,  normal throughout.no focal deficits noted.      Assessment & Plan:  Allergic cough Depo medrol in office and 5 day course of prednisone. Advised pt to complete Z pack which she had already started She will have a CXR should she call in within the next 2 weeks of worsening symptoms  Allergic rhinitis Uncontrolled , causing cough, short sharp steroid  course  Essential hypertension Controlled, no change in medication

## 2015-04-07 NOTE — Assessment & Plan Note (Addendum)
Depo medrol in office and 5 day course of prednisone. Advised pt to complete Z pack which she had already started She will have a CXR should she call in within the next 2 weeks of worsening symptoms

## 2015-04-07 NOTE — Assessment & Plan Note (Signed)
Uncontrolled , causing cough, short sharp steroid course

## 2015-04-07 NOTE — Assessment & Plan Note (Signed)
Controlled, no change in medication  

## 2015-04-24 ENCOUNTER — Ambulatory Visit (HOSPITAL_COMMUNITY)
Admission: RE | Admit: 2015-04-24 | Discharge: 2015-04-24 | Disposition: A | Discharge status: Home / Self Care | Payer: Medicare Other | Source: Ambulatory Visit | Attending: Family Medicine | Admitting: Family Medicine

## 2015-04-24 ENCOUNTER — Ambulatory Visit (INDEPENDENT_AMBULATORY_CARE_PROVIDER_SITE_OTHER): Payer: Medicare Other | Admitting: Family Medicine

## 2015-04-24 ENCOUNTER — Encounter: Payer: Self-pay | Admitting: Family Medicine

## 2015-04-24 VITALS — BP 122/82 | HR 80 | Resp 16 | Ht 67.0 in | Wt 196.0 lb

## 2015-04-24 DIAGNOSIS — R058 Other specified cough: Secondary | ICD-10-CM

## 2015-04-24 DIAGNOSIS — F418 Other specified anxiety disorders: Secondary | ICD-10-CM

## 2015-04-24 DIAGNOSIS — R05 Cough: Secondary | ICD-10-CM | POA: Diagnosis not present

## 2015-04-24 DIAGNOSIS — R220 Localized swelling, mass and lump, head: Secondary | ICD-10-CM

## 2015-04-24 DIAGNOSIS — J309 Allergic rhinitis, unspecified: Secondary | ICD-10-CM

## 2015-04-24 DIAGNOSIS — I10 Essential (primary) hypertension: Secondary | ICD-10-CM

## 2015-04-24 MED ORDER — MONTELUKAST SODIUM 10 MG PO TABS: 10.0000 mg | ORAL_TABLET | Freq: Every day | ORAL | Status: DC

## 2015-04-24 NOTE — Patient Instructions (Signed)
F/u as before  Cough is from uncontrolled allergies please start daily singulair , sent in  Please get CXR today  You are being referred for [place] of right side of face

## 2015-04-24 NOTE — Assessment & Plan Note (Signed)
Controlled, no change in medication DASH diet and commitment to daily physical activity for a minimum of 30 minutes discussed and encouraged, as a part of hypertension management. The importance of attaining a healthy weight is also discussed.  BP/Weight 04/24/2015 03/19/2015 02/12/2015 01/29/2015 12/10/2014 09/11/2014 123XX123  Systolic BP 123XX123 [id number] 123XX123 123456 123XX123 123456 [id number]  Diastolic BP 82 74 72 78 70 84 90  Wt. (Lbs) 196 196.12 190 193 187.04 187 192  BMI 30.69 30.71 29.75 30.22 29.29 29.28 30.06

## 2015-04-24 NOTE — Assessment & Plan Note (Signed)
Controlled, no change in medication  

## 2015-04-24 NOTE — Progress Notes (Signed)
   Subjective:    Patient ID: Theresa Garcia, female    DOB: December 28, 1940, 75 y.o.   MRN: [id number]  HPI 1 month h/o cough often non productive, sometimes white sputum, no fever or chills, worse at night, notes increased tickle in throat and post nasal drainage, no ear pain or sore throat, thinks there is an environmental trigger Left facial swelling , no fever, no dental pain, h/o left breast cancer   Review of Systems See HPI Denies recent fever or chills. Denies sinus pressure, nasal congestion, ear pain or sore throat.  Denies chest pains, palpitations and leg swelling Denies abdominal pain, nausea, vomiting,diarrhea or constipation.   Denies dysuria, frequency, hesitancy or incontinence. Denies joint pain, swelling and limitation in mobility. Denies headaches, seizures, numbness, or tingling. Denies depression, anxiety or insomnia. Denies skin break down or rash.        Objective:   Physical Exam BP 122/82 mmHg  Pulse 80  Resp 16  Ht 5\' 7"  (1.702 m)  Wt 196 lb (88.905 kg)  BMI 30.69 kg/m2  SpO2 97% Patient alert and oriented and in no cardiopulmonary distress.  HEENT: left facial swelling, no erythema or warmth or tenderness, EOMI,   oropharynx pink and moist.  Neck supple no JVD, no mass.TM clear . No gingivitis or tenderness on tapping teeth on affected side  Chest: Clear to auscultation bilaterally.  CVS: S1, S2 no murmurs, no S3.Regular rate.  ABD: Soft non tender.   Ext: No edema  MS: Adequate ROM spine, shoulders, hips and knees.  Skin: Intact, no ulcerations or rash noted.  Psych: Good eye contact, normal affect. Memory intact not anxious or depressed appearing.  CNS: CN 2-12 intact, power,  normal throughout.no focal deficits noted.        Assessment & Plan:  Essential hypertension Controlled, no change in medication DASH diet and commitment to daily physical activity for a minimum of 30 minutes discussed and encouraged, as a part of  hypertension management. The importance of attaining a healthy weight is also discussed.  BP/Weight 04/24/2015 03/19/2015 02/12/2015 01/29/2015 12/10/2014 09/11/2014 123XX123  Systolic BP 123XX123 [id number] 123XX123 123456 123XX123 123456 [id number]  Diastolic BP 82 74 72 78 70 84 90  Wt. (Lbs) 196 196.12 190 193 187.04 187 192  BMI 30.69 30.71 29.75 30.22 29.29 29.28 30.06        Allergic rhinitis Uncontrolled , daily cough x over 4 weeks, CXR and start daily singulair  Allergic cough Untreated and uncontrolled, start singulair daily, cXR which has been ordered in the past, today  Left facial swelling 1 week h/o painless swelling of left side of face, h/o breast cancer. No adenopathy or site of infection on exam [place] of affected area  Depression with anxiety Controlled, no change in medication

## 2015-04-24 NOTE — Assessment & Plan Note (Signed)
Untreated and uncontrolled, start singulair daily, cXR which has been ordered in the past, today

## 2015-04-24 NOTE — Assessment & Plan Note (Signed)
1 week h/o painless swelling of left side of face, h/o breast cancer. No adenopathy or site of infection on exam [place] of affected area

## 2015-04-24 NOTE — Assessment & Plan Note (Signed)
Uncontrolled , daily cough x over 4 weeks, CXR and start daily singulair

## 2015-04-26 ENCOUNTER — Telehealth: Payer: Self-pay

## 2015-04-26 ENCOUNTER — Emergency Department (HOSPITAL_COMMUNITY): Payer: Medicare Other

## 2015-04-26 ENCOUNTER — Inpatient Hospital Stay (HOSPITAL_COMMUNITY)
Admission: EM | Admit: 2015-04-26 | Discharge: 2015-04-30 | DRG: 390 | Disposition: A | Discharge status: Home / Self Care | Payer: Medicare Other | Attending: Internal Medicine | Admitting: Internal Medicine

## 2015-04-26 ENCOUNTER — Encounter (HOSPITAL_COMMUNITY): Payer: Self-pay | Admitting: Emergency Medicine

## 2015-04-26 DIAGNOSIS — I10 Essential (primary) hypertension: Secondary | ICD-10-CM | POA: Diagnosis not present

## 2015-04-26 DIAGNOSIS — Z0189 Encounter for other specified special examinations: Secondary | ICD-10-CM | POA: Insufficient documentation

## 2015-04-26 DIAGNOSIS — K219 Gastro-esophageal reflux disease without esophagitis: Secondary | ICD-10-CM | POA: Diagnosis not present

## 2015-04-26 DIAGNOSIS — Z923 Personal history of irradiation: Secondary | ICD-10-CM

## 2015-04-26 DIAGNOSIS — Z8249 Family history of ischemic heart disease and other diseases of the circulatory system: Secondary | ICD-10-CM

## 2015-04-26 DIAGNOSIS — Z9221 Personal history of antineoplastic chemotherapy: Secondary | ICD-10-CM

## 2015-04-26 DIAGNOSIS — E785 Hyperlipidemia, unspecified: Secondary | ICD-10-CM | POA: Diagnosis not present

## 2015-04-26 DIAGNOSIS — Z90710 Acquired absence of both cervix and uterus: Secondary | ICD-10-CM

## 2015-04-26 DIAGNOSIS — Z833 Family history of diabetes mellitus: Secondary | ICD-10-CM

## 2015-04-26 DIAGNOSIS — R112 Nausea with vomiting, unspecified: Secondary | ICD-10-CM | POA: Diagnosis not present

## 2015-04-26 DIAGNOSIS — K5660 Unspecified intestinal obstruction: Secondary | ICD-10-CM | POA: Diagnosis not present

## 2015-04-26 DIAGNOSIS — K56609 Unspecified intestinal obstruction, unspecified as to partial versus complete obstruction: Secondary | ICD-10-CM | POA: Diagnosis present

## 2015-04-26 DIAGNOSIS — K598 Other specified functional intestinal disorders: Secondary | ICD-10-CM | POA: Diagnosis not present

## 2015-04-26 DIAGNOSIS — K5669 Other intestinal obstruction: Secondary | ICD-10-CM

## 2015-04-26 DIAGNOSIS — Z4682 Encounter for fitting and adjustment of non-vascular catheter: Secondary | ICD-10-CM | POA: Diagnosis not present

## 2015-04-26 DIAGNOSIS — Z87891 Personal history of nicotine dependence: Secondary | ICD-10-CM | POA: Diagnosis not present

## 2015-04-26 DIAGNOSIS — Z825 Family history of asthma and other chronic lower respiratory diseases: Secondary | ICD-10-CM | POA: Diagnosis not present

## 2015-04-26 DIAGNOSIS — Z87442 Personal history of urinary calculi: Secondary | ICD-10-CM

## 2015-04-26 DIAGNOSIS — R0902 Hypoxemia: Secondary | ICD-10-CM | POA: Diagnosis not present

## 2015-04-26 DIAGNOSIS — Z853 Personal history of malignant neoplasm of breast: Secondary | ICD-10-CM | POA: Diagnosis not present

## 2015-04-26 LAB — COMPREHENSIVE METABOLIC PANEL
ALT: 19 U/L (ref 14–54)
AST: 23 U/L (ref 15–41)
Albumin: 3.9 g/dL (ref 3.5–5.0)
Alkaline Phosphatase: 56 U/L (ref 38–126)
Anion gap: 9 (ref 5–15)
BUN: 15 mg/dL (ref 6–20)
CO2: 26 mmol/L (ref 22–32)
Calcium: 9.8 mg/dL (ref 8.9–10.3)
Chloride: 108 mmol/L (ref 101–111)
Creatinine, Ser: 0.95 mg/dL (ref 0.44–1.00)
GFR calc Af Amer: >60 mL/min (ref >60)
GFR calc non Af Amer: 58 mL/min — ABNORMAL LOW (ref >60)
Glucose, Bld: 104 mg/dL — ABNORMAL HIGH (ref 65–99)
Potassium: 3.9 mmol/L (ref 3.5–5.1)
Sodium: 143 mmol/L (ref 135–145)
Total Bilirubin: 0.6 mg/dL (ref 0.3–1.2)
Total Protein: 7.5 g/dL (ref 6.5–8.1)

## 2015-04-26 LAB — CBC WITH DIFFERENTIAL/PLATELET
Basophils Absolute: 0 10*3/uL (ref 0.0–0.1)
Basophils Relative: 0 %
Eosinophils Absolute: 0 10*3/uL (ref 0.0–0.7)
Eosinophils Relative: 0 %
HCT: 43.9 % (ref 36.0–46.0)
Hemoglobin: 14.8 g/dL (ref 12.0–15.0)
Lymphocytes Relative: 18 %
Lymphs Abs: 2.6 10*3/uL (ref 0.7–4.0)
MCH: 32.2 pg (ref 26.0–34.0)
MCHC: 33.7 g/dL (ref 30.0–36.0)
MCV: 95.4 fL (ref 78.0–100.0)
Monocytes Absolute: 0.8 10*3/uL (ref 0.1–1.0)
Monocytes Relative: 6 %
Neutro Abs: 10.8 10*3/uL — ABNORMAL HIGH (ref 1.7–7.7)
Neutrophils Relative %: 76 %
Platelets: 292 10*3/uL (ref 150–400)
RBC: 4.6 MIL/uL (ref 3.87–5.11)
RDW: 13.9 % (ref 11.5–15.5)
WBC: 14.3 10*3/uL — ABNORMAL HIGH (ref 4.0–10.5)

## 2015-04-26 LAB — LIPASE, BLOOD: Lipase: 23 U/L (ref 11–51)

## 2015-04-26 MED ORDER — PANTOPRAZOLE SODIUM 40 MG IV SOLR
40.0000 mg | Freq: Two times a day (BID) | INTRAVENOUS | Status: DC
  Administered 2015-04-26 – 2015-04-29 (×6): 40 mg via INTRAVENOUS
  Filled 2015-04-26 (×6): qty 40

## 2015-04-26 MED ORDER — ONDANSETRON HCL 4 MG/2ML IJ SOLN: 4.0000 mg | Freq: Three times a day (TID) | INTRAMUSCULAR | Status: DC | PRN

## 2015-04-26 MED ORDER — SODIUM CHLORIDE 0.9 % IV SOLN
INTRAVENOUS | Status: DC
  Administered 2015-04-26: 18:00:00 via INTRAVENOUS

## 2015-04-26 MED ORDER — IOHEXOL 300 MG/ML  SOLN
25.0000 mL | Freq: Once | INTRAMUSCULAR | Status: AC | PRN
  Administered 2015-04-26: 25 mL via ORAL

## 2015-04-26 MED ORDER — HYDROMORPHONE HCL 1 MG/ML IJ SOLN: 0.5000 mg | INTRAMUSCULAR | Status: DC | PRN

## 2015-04-26 MED ORDER — IOHEXOL 300 MG/ML  SOLN
100.0000 mL | Freq: Once | INTRAMUSCULAR | Status: AC | PRN
  Administered 2015-04-26: 100 mL via INTRAVENOUS

## 2015-04-26 MED ORDER — SUCRALFATE 1 GM/10ML PO SUSP
1.0000 g | Freq: Three times a day (TID) | ORAL | Status: DC
  Administered 2015-04-26: 1 g via ORAL
  Filled 2015-04-26: qty 10

## 2015-04-26 MED ORDER — METOPROLOL TARTRATE 1 MG/ML IV SOLN
5.0000 mg | Freq: Four times a day (QID) | INTRAVENOUS | Status: DC
  Administered 2015-04-26 – 2015-04-29 (×10): 5 mg via INTRAVENOUS
  Filled 2015-04-26 (×11): qty 5

## 2015-04-26 MED ORDER — POTASSIUM CHLORIDE IN NACL 20-0.45 MEQ/L-% IV SOLN
INTRAVENOUS | Status: DC
  Administered 2015-04-26: 18:00:00 via INTRAVENOUS
  Filled 2015-04-26 (×7): qty 1000

## 2015-04-26 MED ORDER — ONDANSETRON HCL 4 MG/2ML IJ SOLN
4.0000 mg | Freq: Once | INTRAMUSCULAR | Status: AC
  Administered 2015-04-26: 4 mg via INTRAVENOUS
  Filled 2015-04-26: qty 2

## 2015-04-26 MED ORDER — SODIUM CHLORIDE 0.9 % IV BOLUS (SEPSIS)
500.0000 mL | Freq: Once | INTRAVENOUS | Status: AC
  Administered 2015-04-26: 500 mL via INTRAVENOUS

## 2015-04-26 MED ORDER — ALUM & MAG HYDROXIDE-SIMETH 200-200-20 MG/5ML PO SUSP
30.0000 mL | Freq: Four times a day (QID) | ORAL | Status: DC | PRN
Start: 2015-04-26 — End: 2015-04-30
  Administered 2015-04-27: 30 mL via ORAL
  Filled 2015-04-26: qty 30

## 2015-04-26 MED ORDER — FENTANYL CITRATE (PF) 100 MCG/2ML IJ SOLN
25.0000 ug | Freq: Once | INTRAMUSCULAR | Status: AC
  Administered 2015-04-26: 25 ug via INTRAVENOUS
  Filled 2015-04-26: qty 2

## 2015-04-26 MED ORDER — ONDANSETRON HCL 4 MG PO TABS
4.0000 mg | ORAL_TABLET | Freq: Four times a day (QID) | ORAL | Status: DC | PRN
Start: 2015-04-26 — End: 2015-04-30

## 2015-04-26 MED ORDER — ENOXAPARIN SODIUM 40 MG/0.4ML ~~LOC~~ SOLN
40.0000 mg | SUBCUTANEOUS | Status: DC
  Administered 2015-04-26 – 2015-04-29 (×4): 40 mg via SUBCUTANEOUS
  Filled 2015-04-26 (×4): qty 0.4

## 2015-04-26 MED ORDER — ONDANSETRON HCL 4 MG/2ML IJ SOLN: 4.0000 mg | Freq: Four times a day (QID) | INTRAMUSCULAR | Status: DC | PRN

## 2015-04-26 MED ORDER — TEMAZEPAM 15 MG PO CAPS: 30.0000 mg | ORAL_CAPSULE | Freq: Every evening | ORAL | Status: DC | PRN

## 2015-04-26 MED ORDER — ZOLPIDEM TARTRATE 5 MG PO TABS
5.0000 mg | ORAL_TABLET | Freq: Every evening | ORAL | Status: DC | PRN
  Administered 2015-04-27 – 2015-04-29 (×3): 5 mg via ORAL
  Filled 2015-04-26 (×3): qty 1

## 2015-04-26 MED ORDER — ACETAMINOPHEN 325 MG PO TABS: 650.0000 mg | ORAL_TABLET | Freq: Four times a day (QID) | ORAL | Status: DC | PRN

## 2015-04-26 MED ORDER — ACETAMINOPHEN 650 MG RE SUPP: 650.0000 mg | Freq: Four times a day (QID) | RECTAL | Status: DC | PRN

## 2015-04-26 MED ORDER — HYDROMORPHONE HCL 1 MG/ML IJ SOLN
0.5000 mg | INTRAMUSCULAR | Status: DC | PRN
  Administered 2015-04-26: 0.5 mg via INTRAVENOUS
  Filled 2015-04-26: qty 1

## 2015-04-26 NOTE — ED Notes (Signed)
Pt states that she feels much better at this time.

## 2015-04-26 NOTE — ED Provider Notes (Signed)
CSN: [id number]     Arrival date & time 04/26/15  1257 History   First MD Initiated Contact with Patient 04/26/15 1307     Chief Complaint  Patient presents with  . Emesis     (Consider location/radiation/quality/duration/timing/severity/associated sxs/prior Treatment) HPI Patient presents with concern of nausea, vomiting, discomfort. Symptoms began about 11 hours ago. Patient was in her usual state of health prior to the onset of symptoms. Since onset patient has had multiple episodes of vomiting, including dark material. No syncope, lightheadedness, chest pain, diarrhea. Patient denies abdominal pain. No clear precipitant, no recent sick contact. Patient denies substantial medical problems, but has history of cancer in the distant past.  Past Medical History  Diagnosis Date  . Insomnia   . Hyperlipidemia   . Hypertension   . Cancer ([place])   . Pneumonia   . GERD (gastroesophageal reflux disease)   . Bronchitis   . Kidney stone   . Depression   . Chronic back pain   . Vertigo    Past Surgical History  Procedure Laterality Date  . Abdominal hysterectomy    . Breast surgery      left  . Tubal ligation    . Cystoscopy/retrograde/ureteroscopy/stone extraction with basket  01/07/2011    Procedure: CYSTOSCOPY/RETROGRADE/URETEROSCOPY/STONE EXTRACTION WITH BASKET;  Surgeon: Marissa Nestle;  Location: [facility];  Service: Urology;  Laterality: Left;  Balloon Dilatation; stone given to family per MD   Family History  Problem Relation Age of Onset  . COPD Father   . COPD Sister   . COPD Sister   . Diabetes Sister   . Diabetes Brother   . Heart disease Brother    Social History  Substance Use Topics  . Smoking status: Former Smoker    Types: Cigarettes  . Smokeless tobacco: Never Used     Comment: quit 25+ years ago  . Alcohol Use: No   OB History    No data available     Review of Systems  Constitutional:       Per HPI, otherwise negative  HENT:       Per HPI,  otherwise negative  Respiratory:       Per HPI, otherwise negative  Cardiovascular:       Per HPI, otherwise negative  Gastrointestinal: Positive for vomiting.  Endocrine:       Negative aside from HPI  Genitourinary:       Neg aside from HPI   Musculoskeletal:       Per HPI, otherwise negative  Skin: Negative.   Neurological: Negative for syncope.      Allergies  Meclizine and Penicillins  Home Medications   Prior to Admission medications   Medication Sig Start Date End Date Taking? Authorizing Provider  acyclovir (ZOVIRAX) 400 MG tablet TAKE ONE TABLET BY MOUTH TWICE DAILY 02/27/15  Yes Fayrene Helper, MD  amLODipine-benazepril (LOTREL) 5-40 MG capsule Take 1 capsule by mouth daily. 01/29/15  Yes Fayrene Helper, MD  aspirin 81 MG tablet Take 81 mg by mouth daily.   Yes Historical Provider, MD  atorvastatin (LIPITOR) 20 MG tablet Take 1 tablet (20 mg total) by mouth every evening. 01/29/15  Yes Fayrene Helper, MD  clotrimazole-betamethasone (LOTRISONE) cream Apply 1 application topically 2 (two) times daily. For 10 days 03/14/14  Yes Fayrene Helper, MD  Cyanocobalamin (B-12) 1000 MCG CAPS Take 1 capsule by mouth daily.   Yes Historical Provider, MD  Diclofenac-Misoprostol 75-0.2 MG TBEC Take 1 tablet  by mouth daily.  08/28/13  Yes Historical Provider, MD  montelukast (SINGULAIR) 10 MG tablet Take 1 tablet (10 mg total) by mouth at bedtime. 04/24/15  Yes Fayrene Helper, MD  temazepam (RESTORIL) 30 MG capsule Take 1 capsule (30 mg total) by mouth at bedtime as needed. Patient taking differently: Take 30 mg by mouth at bedtime as needed for sleep.  01/29/15  Yes Fayrene Helper, MD  vitamin C (ASCORBIC ACID) 500 MG tablet Take 500 mg by mouth daily.   Yes Historical Provider, MD   BP 163/87 mmHg  Pulse 73  Temp(Src) 98.2 F (36.8 C) (Oral)  Resp 18  Ht 5\' 7"  (1.702 m)  Wt 195 lb (88.451 kg)  BMI 30.53 kg/m2  SpO2 95% Physical Exam  Constitutional: She is  oriented to person, place, and time. She appears well-developed and well-nourished. No distress.  HENT:  Head: Normocephalic and atraumatic.  Eyes: Conjunctivae and EOM are normal.  Cardiovascular: Normal rate and regular rhythm.   Pulmonary/Chest: Effort normal and breath sounds normal. No stridor. No respiratory distress.  Abdominal: She exhibits no distension.  Diffuse mild tenderness, guarding, no rebound, peritoneal findings  Musculoskeletal: She exhibits no edema.  Neurological: She is alert and oriented to person, place, and time. No cranial nerve deficit.  Skin: Skin is warm and dry.  Psychiatric: She has a normal mood and affect.  Nursing note and vitals reviewed.   ED Course  Procedures (including critical care time) Labs Review Labs Reviewed  COMPREHENSIVE METABOLIC PANEL - Abnormal; Notable for the following:    Glucose, Bld 104 (*)    GFR calc non Af Amer 58 (*)    All other components within normal limits  CBC WITH DIFFERENTIAL/PLATELET - Abnormal; Notable for the following:    WBC 14.3 (*)    Neutro Abs 10.8 (*)    All other components within normal limits  LIPASE, BLOOD    Imaging Review Ct Abdomen Pelvis W Contrast  04/26/2015  CLINICAL DATA:  Emesis and abdominal pain EXAM: CT ABDOMEN AND PELVIS WITH CONTRAST TECHNIQUE: Multidetector CT imaging of the abdomen and pelvis was performed using the standard protocol following bolus administration of intravenous contrast. CONTRAST:  182mL OMNIPAQUE IOHEXOL 300 MG/ML SOLN, 74mL OMNIPAQUE IOHEXOL 300 MG/ML SOLN COMPARISON:  01/21/2015 FINDINGS: Some scarring is noted in the lung bases bilaterally. No focal infiltrate or sizable parenchymal nodule is noted. The liver is diffusely fatty infiltrated. The gallbladder, spleen, adrenal glands and pancreas are within normal limits. The kidneys are well visualized bilaterally without renal calculi or obstructive changes. Renal cystic changes noted on the right stable from the prior  exam. The appendix is within normal limits. Scattered diverticular change of the colon is noted without evidence of diverticulitis. There are multiple dilated loops of distal jejunum and proximal ileum identified. A transition zone is noted in the right lower quadrant on image number 68 of series 2 although no definitive mass lesion is noted. This may be related to adhesions from prior surgery. Aortoiliac calcifications are noted. The bladder is well distended. The uterus has been surgically removed. Small amount of free pelvic fluid is noted. No free air is seen. IMPRESSION: Changes of partial small bowel obstruction in the distal jejunum and proximal ileum. A transition zone is noted in the right lower quadrant although no definitive mass lesion is seen. These changes are likely related to adhesions. Diverticulosis without evidence of diverticulitis. Fatty liver. Renal cystic change. Electronically Signed   By: Elta Guadeloupe  Lukens M.D.   On: 04/26/2015 15:40   I have personally reviewed and evaluated these images and lab results as part of my medical decision-making.  On repeat exam the patient appears better, with diminished vomiting. I reviewed all findings with her, specifically bowel obstruction. Patient confirms that she has prior abdominal surgery, including ureteral stone removal.  I discussed the case with our surgical team, and the patient will be admitted to the hospitalist team for evaluation.  The patient deferred NG tube placement.  MDM  Elderly female presents with one day of nausea, vomiting Patient initially denies abdominal pain, but with minimal palpation has tenderness everywhere. Patient found to have bowel obstruction, with transition point. Patient admitted for further evaluation and management.   Carmin Muskrat, MD 04/26/15 1630

## 2015-04-26 NOTE — ED Notes (Signed)
Pt vomited immediately after Carafate administration.  Clear liquid resembling oral contrast.  Pt states she actually feels a little better after vomiting.

## 2015-04-26 NOTE — Telephone Encounter (Signed)
Advised patient to seek care that the ED. 

## 2015-04-26 NOTE — H&P (Signed)
History and Physical  Theresa Garcia [id number] DOB: June 07, 1940 DOA: 04/26/2015  Referring physician: Dr Vanita Panda, ED physician PCP: Tula Nakayama, MD   Chief Complaint: Vomiting  HPI: Theresa Garcia is a 75 y.o. female  With a history of breast cancer status post lumpectomy, chemotherapy, and radiation, hypertension, GERD, chronic back pain. Patient seen for vomiting that started approximately 2:00 AM. Patient had multiple episodes of dark emesis throughout the day. Worsened with food and liquid intake improved with remaining nothing by mouth. Since being in the emergency department and receiving antiemetics, the patient has not vomited for approximately 2 hours. She is not passing gas and has not stooled today. She had loose stools yesterday.   Review of Systems:   Pt complains of abdominal pain with palpation.  Pt denies any fevers, chills, diarrhea, constipation, shortness of breath, dyspnea on exertion, orthopnea, cough, wheezing, palpitations, headache, vision changes, lightheadedness, dizziness, diarrhea, constipation, melena, rectal bleeding.  Review of systems are otherwise negative  Past Medical History  Diagnosis Date  . Insomnia   . Hyperlipidemia   . Hypertension   . Cancer ([place])   . Pneumonia   . GERD (gastroesophageal reflux disease)   . Bronchitis   . Kidney stone   . Depression   . Chronic back pain   . Vertigo    Past Surgical History  Procedure Laterality Date  . Abdominal hysterectomy    . Breast surgery      left  . Tubal ligation    . Cystoscopy/retrograde/ureteroscopy/stone extraction with basket  01/07/2011    Procedure: CYSTOSCOPY/RETROGRADE/URETEROSCOPY/STONE EXTRACTION WITH BASKET;  Surgeon: Marissa Nestle;  Location: [facility];  Service: Urology;  Laterality: Left;  Balloon Dilatation; stone given to family per MD   Social History:  reports that she has quit smoking. Her smoking use included Cigarettes. She has never used smokeless  tobacco. She reports that she does not drink alcohol or use illicit drugs. Patient lives at  home able to participate in activities of daily living  Allergies  Allergen Reactions  . Meclizine Diarrhea  . Penicillins Rash and Other (See Comments)    Has patient had a PCN reaction causing immediate rash, facial/tongue/throat swelling, SOB or lightheadedness with hypotension: Yes Has patient had a PCN reaction causing severe rash involving mucus membranes or skin necrosis: No Has patient had a PCN reaction that required hospitalization No Has patient had a PCN reaction occurring within the last 10 years: No If all of the above answers are "NO", then may proceed with Cephalosporin use.     Family History  Problem Relation Age of Onset  . COPD Father   . COPD Sister   . COPD Sister   . Diabetes Sister   . Diabetes Brother   . Heart disease Brother      Prior to Admission medications   Medication Sig Start Date End Date Taking? Authorizing Provider  acyclovir (ZOVIRAX) 400 MG tablet TAKE ONE TABLET BY MOUTH TWICE DAILY 02/27/15  Yes Fayrene Helper, MD  amLODipine-benazepril (LOTREL) 5-40 MG capsule Take 1 capsule by mouth daily. 01/29/15  Yes Fayrene Helper, MD  aspirin 81 MG tablet Take 81 mg by mouth daily.   Yes Historical Provider, MD  atorvastatin (LIPITOR) 20 MG tablet Take 1 tablet (20 mg total) by mouth every evening. 01/29/15  Yes Fayrene Helper, MD  clotrimazole-betamethasone (LOTRISONE) cream Apply 1 application topically 2 (two) times daily. For 10 days 03/14/14  Yes Fayrene Helper, MD  Cyanocobalamin (B-12) 1000 MCG CAPS Take 1 capsule by mouth daily.   Yes Historical Provider, MD  Diclofenac-Misoprostol 75-0.2 MG TBEC Take 1 tablet by mouth daily.  08/28/13  Yes Historical Provider, MD  montelukast (SINGULAIR) 10 MG tablet Take 1 tablet (10 mg total) by mouth at bedtime. 04/24/15  Yes Fayrene Helper, MD  temazepam (RESTORIL) 30 MG capsule Take 1 capsule (30 mg  total) by mouth at bedtime as needed. Patient taking differently: Take 30 mg by mouth at bedtime as needed for sleep.  01/29/15  Yes Fayrene Helper, MD  vitamin C (ASCORBIC ACID) 500 MG tablet Take 500 mg by mouth daily.   Yes Historical Provider, MD    Physical Exam: BP 163/87 mmHg  Pulse 73  Temp(Src) 98.2 F (36.8 C) (Oral)  Resp 18  Ht 5\' 7"  (1.702 m)  Wt 88.451 kg (195 lb)  BMI 30.53 kg/m2  SpO2 95%  General: older black female . Awake and alert and oriented x3. No acute cardiopulmonary distress.  Eyes: Pupils equal, round, reactive to light. Extraocular muscles are intact. Sclerae anicteric and noninjected.  ENT:  dryosal membranes. No mucosal lesions.  Neck: Neck supple without lymphadenopathy. No carotid bruits. No masses palpated.  Cardiovascular: Regular rate with normal S1-S2 sounds. No murmurs, rubs, gallops auscultated. No JVD.  Respiratory: Good respiratory effort with no wheezes, rales, rhonchi. Lungs clear to auscultation bilaterally.  Abdomen: Soft, tender throughout,  but mostly in the lower quadrants. Mildly distended.  Hypoactive bowel sounds. No masses or hepatosplenomegaly  Skin: Dry, warm to touch. 2+ dorsalis pedis and radial pulses. Musculoskeletal: No calf or leg pain. All major joints not erythematous nontender.  Psychiatric: Intact judgment and insight.  Neurologic: No focal neurological deficits. Cranial nerves II through XII are grossly intact.           Labs on Admission:  Basic Metabolic Panel:  Recent Labs Lab 04/26/15 1315  NA 143  K 3.9  CL 108  CO2 26  GLUCOSE 104*  BUN 15  CREATININE 0.95  CALCIUM 9.8   Liver Function Tests:  Recent Labs Lab 04/26/15 1315  AST 23  ALT 19  ALKPHOS 56  BILITOT 0.6  PROT 7.5  ALBUMIN 3.9    Recent Labs Lab 04/26/15 1315  LIPASE 23   No results for input(s): AMMONIA in the last 168 hours. CBC:  Recent Labs Lab 04/26/15 1315  WBC 14.3*  NEUTROABS 10.8*  HGB 14.8  HCT 43.9    MCV 95.4  PLT 292   Cardiac Enzymes: No results for input(s): CKTOTAL, CKMB, CKMBINDEX, TROPONINI in the last 168 hours.  BNP (last 3 results) No results for input(s): BNP in the last 8760 hours.  ProBNP (last 3 results) No results for input(s): PROBNP in the last 8760 hours.  CBG: No results for input(s): GLUCAP in the last 168 hours.  Radiological Exams on Admission: Ct Abdomen Pelvis W Contrast  04/26/2015  CLINICAL DATA:  Emesis and abdominal pain EXAM: CT ABDOMEN AND PELVIS WITH CONTRAST TECHNIQUE: Multidetector CT imaging of the abdomen and pelvis was performed using the standard protocol following bolus administration of intravenous contrast. CONTRAST:  16mL OMNIPAQUE IOHEXOL 300 MG/ML SOLN, 41mL OMNIPAQUE IOHEXOL 300 MG/ML SOLN COMPARISON:  01/21/2015 FINDINGS: Some scarring is noted in the lung bases bilaterally. No focal infiltrate or sizable parenchymal nodule is noted. The liver is diffusely fatty infiltrated. The gallbladder, spleen, adrenal glands and pancreas are within normal limits. The kidneys are well visualized bilaterally without  renal calculi or obstructive changes. Renal cystic changes noted on the right stable from the prior exam. The appendix is within normal limits. Scattered diverticular change of the colon is noted without evidence of diverticulitis. There are multiple dilated loops of distal jejunum and proximal ileum identified. A transition zone is noted in the right lower quadrant on image number 68 of series 2 although no definitive mass lesion is noted. This may be related to adhesions from prior surgery. Aortoiliac calcifications are noted. The bladder is well distended. The uterus has been surgically removed. Small amount of free pelvic fluid is noted. No free air is seen. IMPRESSION: Changes of partial small bowel obstruction in the distal jejunum and proximal ileum. A transition zone is noted in the right lower quadrant although no definitive mass lesion is  seen. These changes are likely related to adhesions. Diverticulosis without evidence of diverticulitis. Fatty liver. Renal cystic change. Electronically Signed   By: Inez Catalina M.D.   On: 04/26/2015 15:40    Assessment/Plan Present on Admission:  . SBO (small bowel obstruction) ([place]) . Essential hypertension  This patient was discussed with the ED physician, including pertinent vitals, physical exam findings, labs, and imaging.  We also discussed care given by the ED provider.   #1 small bowel obstruction  Admit to MedSurg  Surgery consult  Start Protonix twice a day  Nothing by mouth  As the patient hasn't had any emesis for 2 hours, and because patient is declining NG tube, patient will remain without gastric decompression with NG tube.  Recheck CBC in the morning  Recheck metabolic panel in the morning  IV fluids with potassium  As the patient has dark emesis, will check for blood in emesis  #2 essential hypertension    hold home medications  start metoprolol 5 mg every 6 hours IV  DVT prophylaxis: Lovenox   Consultants: surgery consult   Code Status: full code  Family Communication: sister in the room    Disposition Plan: admission to [place], DO [facility] Pager [phone]

## 2015-04-26 NOTE — ED Notes (Signed)
Patient arrives via EMS from home with c/o emesis, dark in nature since this 0200 today. Patient alert/oriented x 4. Patient lives at home with family. Denies pain.

## 2015-04-27 ENCOUNTER — Inpatient Hospital Stay (HOSPITAL_COMMUNITY): Payer: Medicare Other

## 2015-04-27 DIAGNOSIS — K219 Gastro-esophageal reflux disease without esophagitis: Secondary | ICD-10-CM

## 2015-04-27 LAB — CBC
HCT: 40.4 % (ref 36.0–46.0)
Hemoglobin: 13.2 g/dL (ref 12.0–15.0)
MCH: 31.5 pg (ref 26.0–34.0)
MCHC: 32.7 g/dL (ref 30.0–36.0)
MCV: 96.4 fL (ref 78.0–100.0)
Platelets: 275 10*3/uL (ref 150–400)
RBC: 4.19 MIL/uL (ref 3.87–5.11)
RDW: 14.1 % (ref 11.5–15.5)
WBC: 11.2 10*3/uL — ABNORMAL HIGH (ref 4.0–10.5)

## 2015-04-27 MED ORDER — POTASSIUM CHLORIDE IN NACL 20-0.45 MEQ/L-% IV SOLN
INTRAVENOUS | Status: AC
  Filled 2015-04-27: qty 1000

## 2015-04-27 MED ORDER — KCL IN DEXTROSE-NACL 20-5-0.45 MEQ/L-%-% IV SOLN
INTRAVENOUS | Status: DC
  Administered 2015-04-27 – 2015-04-28 (×4): via INTRAVENOUS

## 2015-04-27 NOTE — Progress Notes (Signed)
Patient has no had any episodes of nausea of vomiting this shift. Patient had one episode of heartburn which was relieved by prn.

## 2015-04-27 NOTE — Progress Notes (Signed)
Patient refused NG tube.  Patient was very irritated by tube, and NG tube had no output in cannister upon arrival at beginning of shift. Notified mid-level of patient wanting NG tube out.  NG pulled. Patient tolerated procedure well. Will continue to monitor patient.

## 2015-04-27 NOTE — Progress Notes (Signed)
Utilization review completed.  

## 2015-04-27 NOTE — Progress Notes (Signed)
[facility] PROGRESS NOTE  Theresa Garcia [id number] DOB: [birth year] DOA: 04/26/2015 PCP: Tula Nakayama, MD    Code Status: Full code Family Communication: Discussed with her sister on 04/27/15 Disposition Plan: Discharge when clinically appropriate   Consultants:  Gen. surgery  Procedures:  NG tube placement ordered  Antibiotics:  None  HPI/Subjective: Patient complains of diffuse abdominal pain. She has had nausea this morning but no vomiting. She had refused NG tube placement on admission.  Objective: Filed Vitals:   04/26/15 2209 04/27/15 0613  BP: 127/65 133/71  Pulse: 64 70  Temp: 97.6 F (36.4 C) 97 F (36.1 C)  Resp: 20 17   oxygen saturation 95%  Intake/Output Summary (Last 24 hours) at 04/27/15 1211 Last data filed at 04/27/15 0900  Gross per 24 hour  Intake   1175 ml  Output      0 ml  Net   1175 ml   Filed Weights   04/26/15 1305 04/26/15 1851  Weight: 88.451 kg (195 lb) 89.268 kg (196 lb 12.8 oz)    Exam:   General:  Alert 75 year old African-American woman who appears ill.  Cardiovascular: S1, S2, with a 2 to 3/6 systolic murmur.  Respiratory: Clear anteriorly with decreased breath sounds in the bases.  Abdomen: Distended, hypoactive bowel sounds, moderately diffusely tender.  Musculoskeletal/extremity: No pedal edema or acute hot red joints.  Neurologic/psychiatric: She has a flat affect. She is alert and oriented 2. Cranial nerves II through XII are grossly intact.   Data Reviewed: Basic Metabolic Panel:  Recent Labs Lab 04/26/15 1315  NA 143  K 3.9  CL 108  CO2 26  GLUCOSE 104*  BUN 15  CREATININE 0.95  CALCIUM 9.8   Liver Function Tests:  Recent Labs Lab 04/26/15 1315  AST 23  ALT 19  ALKPHOS 56  BILITOT 0.6  PROT 7.5  ALBUMIN 3.9    Recent Labs Lab 04/26/15 1315  LIPASE 23   No results for input(s): AMMONIA in the last 168 hours. CBC:  Recent Labs Lab 04/26/15 1315 04/27/15 0604   WBC 14.3* 11.2*  NEUTROABS 10.8*  --   HGB 14.8 13.2  HCT 43.9 40.4  MCV 95.4 96.4  PLT 292 275   Cardiac Enzymes: No results for input(s): CKTOTAL, CKMB, CKMBINDEX, TROPONINI in the last 168 hours. BNP (last 3 results) No results for input(s): BNP in the last 8760 hours.  ProBNP (last 3 results) No results for input(s): PROBNP in the last 8760 hours.  CBG: No results for input(s): GLUCAP in the last 168 hours.  No results found for this or any previous visit (from the past 240 hour(s)).   Studies: Ct Abdomen Pelvis W Contrast  04/26/2015  CLINICAL DATA:  Emesis and abdominal pain EXAM: CT ABDOMEN AND PELVIS WITH CONTRAST TECHNIQUE: Multidetector CT imaging of the abdomen and pelvis was performed using the standard protocol following bolus administration of intravenous contrast. CONTRAST:  152mL OMNIPAQUE IOHEXOL 300 MG/ML SOLN, 79mL OMNIPAQUE IOHEXOL 300 MG/ML SOLN COMPARISON:  01/21/2015 FINDINGS: Some scarring is noted in the lung bases bilaterally. No focal infiltrate or sizable parenchymal nodule is noted. The liver is diffusely fatty infiltrated. The gallbladder, spleen, adrenal glands and pancreas are within normal limits. The kidneys are well visualized bilaterally without renal calculi or obstructive changes. Renal cystic changes noted on the right stable from the prior exam. The appendix is within normal limits. Scattered diverticular change of the colon is noted without evidence of diverticulitis. There are multiple dilated  loops of distal jejunum and proximal ileum identified. A transition zone is noted in the right lower quadrant on image number 68 of series 2 although no definitive mass lesion is noted. This may be related to adhesions from prior surgery. Aortoiliac calcifications are noted. The bladder is well distended. The uterus has been surgically removed. Small amount of free pelvic fluid is noted. No free air is seen. IMPRESSION: Changes of partial small bowel obstruction  in the distal jejunum and proximal ileum. A transition zone is noted in the right lower quadrant although no definitive mass lesion is seen. These changes are likely related to adhesions. Diverticulosis without evidence of diverticulitis. Fatty liver. Renal cystic change. Electronically Signed   By: Inez Catalina M.D.   On: 04/26/2015 15:40    Scheduled Meds: . enoxaparin (LOVENOX) injection  40 mg Subcutaneous Q24H  . metoprolol  5 mg Intravenous 4 times per day  . pantoprazole (PROTONIX) IV  40 mg Intravenous Q12H   Continuous Infusions: . 0.45 % NaCl with KCl 20 mEq / L 100 mL/hr at 04/26/15 1815   Assessment and plan:  Active Problems:   Essential hypertension   SBO (small bowel obstruction) ([place])   1. Partial small bowel obstruction. Abdominal/pelvic CT on admission revealed changes of partial small bowel obstruction in the distal jejunum and proximal ileum; transition zone was noted in the right lower quadrant without definite mass lesion seen; diverticulosis without diverticulitis and fatty liver. -Patient refused NG tube placement in the ED. Her abdomen is moderately distended and tender. She would benefit from NG decompression. She was encouraged to try it and therefore has agreed to have it placed. -Gen. surgery consulted. Dr. Arnoldo Morale assessment and recommendations noted and appreciated. -We'll add D5 to the maintenance IV fluids.  Essential hypertension. Patient is treated chronically with amlodipine/benazepril. She was started on IV metoprolol due to nothing by mouth status. Her blood pressure is currently stable.  GERD. Patient was started on IV Protonix. Currently stable.   Time spent: 30 minutes    [facility] Pager [phone]. If 7PM-7AM, please contact night-coverage at [email or web address], password [facility] 04/27/2015, 12:11 PM  LOS: 1 day

## 2015-04-27 NOTE — Consult Note (Signed)
Reason for Consult: Small bowel obstruction Referring Physician: Hospitalist  Theresa Garcia is an 75 y.o. female.  HPI: Patient is a 75 year old black female who presented emergency room with worsening nausea and vomiting. CT scan of the abdomen performed in the emergency room revealed a small bowel obstruction most likely secondary to adhesive disease. No masses were noted. Since she's been admitted, she has had no nausea or vomiting. She does describe abdominal cramping. She has not had a bowel movement since her admission. In talking with the family member, it sounds like she had a similar episode treated at [facility] last year. She did not require surgery. She is status post a hysterectomy in the remote past.  Past Medical History  Diagnosis Date  . Insomnia   . Hyperlipidemia   . Hypertension   . Cancer ([place])   . Pneumonia   . GERD (gastroesophageal reflux disease)   . Bronchitis   . Kidney stone   . Depression   . Chronic back pain   . Vertigo     Past Surgical History  Procedure Laterality Date  . Abdominal hysterectomy    . Breast surgery      left  . Tubal ligation    . Cystoscopy/retrograde/ureteroscopy/stone extraction with basket  01/07/2011    Procedure: CYSTOSCOPY/RETROGRADE/URETEROSCOPY/STONE EXTRACTION WITH BASKET;  Surgeon: Marissa Nestle;  Location: [facility];  Service: Urology;  Laterality: Left;  Balloon Dilatation; stone given to family per MD    Family History  Problem Relation Age of Onset  . COPD Father   . COPD Sister   . COPD Sister   . Diabetes Sister   . Diabetes Brother   . Heart disease Brother     Social History:  reports that she has quit smoking. Her smoking use included Cigarettes. She has never used smokeless tobacco. She reports that she does not drink alcohol or use illicit drugs.  Allergies:  Allergies  Allergen Reactions  . Meclizine Diarrhea  . Penicillins Rash and Other (See Comments)    Has patient had a PCN  reaction causing immediate rash, facial/tongue/throat swelling, SOB or lightheadedness with hypotension: Yes Has patient had a PCN reaction causing severe rash involving mucus membranes or skin necrosis: No Has patient had a PCN reaction that required hospitalization No Has patient had a PCN reaction occurring within the last 10 years: No If all of the above answers are "NO", then may proceed with Cephalosporin use.     Medications: I have reviewed the patient's current medications.  Results for orders placed or performed during the hospital encounter of 04/26/15 (from the past 48 hour(s))  Comprehensive metabolic panel     Status: Abnormal   Collection Time: 04/26/15  1:15 PM  Result Value Ref Range   Sodium 143 135 - 145 mmol/L   Potassium 3.9 3.5 - 5.1 mmol/L   Chloride 108 101 - 111 mmol/L   CO2 26 22 - 32 mmol/L   Glucose, Bld 104 (H) 65 - 99 mg/dL   BUN 15 6 - 20 mg/dL   Creatinine, Ser 0.95 0.44 - 1.00 mg/dL   Calcium 9.8 8.9 - 10.3 mg/dL   Total Protein 7.5 6.5 - 8.1 g/dL   Albumin 3.9 3.5 - 5.0 g/dL   AST 23 15 - 41 U/L   ALT 19 14 - 54 U/L   Alkaline Phosphatase 56 38 - 126 U/L   Total Bilirubin 0.6 0.3 - 1.2 mg/dL   GFR calc non Af Wyvonnia Lora  58 (L) >60 mL/min   GFR calc Af Amer >60 >60 mL/min    Comment: (NOTE) The eGFR has been calculated using the CKD EPI equation. This calculation has not been validated in all clinical situations. eGFR's persistently <60 mL/min signify possible Chronic Kidney Disease.    Anion gap 9 5 - 15  Lipase, blood     Status: None   Collection Time: 04/26/15  1:15 PM  Result Value Ref Range   Lipase 23 11 - 51 U/L  CBC with Differential     Status: Abnormal   Collection Time: 04/26/15  1:15 PM  Result Value Ref Range   WBC 14.3 (H) 4.0 - 10.5 K/uL   RBC 4.60 3.87 - 5.11 MIL/uL   Hemoglobin 14.8 12.0 - 15.0 g/dL   HCT 43.9 36.0 - 46.0 %   MCV 95.4 78.0 - 100.0 fL   MCH 32.2 26.0 - 34.0 pg   MCHC 33.7 30.0 - 36.0 g/dL   RDW 13.9 11.5 -  15.5 %   Platelets 292 150 - 400 K/uL   Neutrophils Relative % 76 %   Neutro Abs 10.8 (H) 1.7 - 7.7 K/uL   Lymphocytes Relative 18 %   Lymphs Abs 2.6 0.7 - 4.0 K/uL   Monocytes Relative 6 %   Monocytes Absolute 0.8 0.1 - 1.0 K/uL   Eosinophils Relative 0 %   Eosinophils Absolute 0.0 0.0 - 0.7 K/uL   Basophils Relative 0 %   Basophils Absolute 0.0 0.0 - 0.1 K/uL  CBC     Status: Abnormal   Collection Time: 04/27/15  6:04 AM  Result Value Ref Range   WBC 11.2 (H) 4.0 - 10.5 K/uL   RBC 4.19 3.87 - 5.11 MIL/uL   Hemoglobin 13.2 12.0 - 15.0 g/dL   HCT 40.4 36.0 - 46.0 %   MCV 96.4 78.0 - 100.0 fL   MCH 31.5 26.0 - 34.0 pg   MCHC 32.7 30.0 - 36.0 g/dL   RDW 14.1 11.5 - 15.5 %   Platelets 275 150 - 400 K/uL    Ct Abdomen Pelvis W Contrast  04/26/2015  CLINICAL DATA:  Emesis and abdominal pain EXAM: CT ABDOMEN AND PELVIS WITH CONTRAST TECHNIQUE: Multidetector CT imaging of the abdomen and pelvis was performed using the standard protocol following bolus administration of intravenous contrast. CONTRAST:  173m OMNIPAQUE IOHEXOL 300 MG/ML SOLN, 227mOMNIPAQUE IOHEXOL 300 MG/ML SOLN COMPARISON:  01/21/2015 FINDINGS: Some scarring is noted in the lung bases bilaterally. No focal infiltrate or sizable parenchymal nodule is noted. The liver is diffusely fatty infiltrated. The gallbladder, spleen, adrenal glands and pancreas are within normal limits. The kidneys are well visualized bilaterally without renal calculi or obstructive changes. Renal cystic changes noted on the right stable from the prior exam. The appendix is within normal limits. Scattered diverticular change of the colon is noted without evidence of diverticulitis. There are multiple dilated loops of distal jejunum and proximal ileum identified. A transition zone is noted in the right lower quadrant on image number 68 of series 2 although no definitive mass lesion is noted. This may be related to adhesions from prior surgery. Aortoiliac  calcifications are noted. The bladder is well distended. The uterus has been surgically removed. Small amount of free pelvic fluid is noted. No free air is seen. IMPRESSION: Changes of partial small bowel obstruction in the distal jejunum and proximal ileum. A transition zone is noted in the right lower quadrant although no definitive mass lesion is seen.  These changes are likely related to adhesions. Diverticulosis without evidence of diverticulitis. Fatty liver. Renal cystic change. Electronically Signed   By: Inez Catalina M.D.   On: 04/26/2015 15:40    ROS: See chart Blood pressure 133/71, pulse 70, temperature 97 F (36.1 C), temperature source Oral, resp. rate 17, height 5' 7"  (1.702 m), weight 89.268 kg (196 lb 12.8 oz), SpO2 95 %. Physical Exam: Pleasant black female in no acute distress. Abdomen is soft with nonspecific migratory discomfort to palpation throughout her abdomen. No rigidity is noted. No hernias are noted. No hepatosplenomegaly. Occasional bowel sounds appreciated.  Assessment/Plan: Impression: Partial small bowel obstruction. Patient has not required an NG tube. Plan: No need for acute surgical intervention at this time. Will start patient on sips of clears. May advance diet if patient starts having bowel movements. We'll follow with you.  Rayshaun Needle A 04/27/2015, 9:36 AM

## 2015-04-28 LAB — CBC
HCT: 37.9 % (ref 36.0–46.0)
Hemoglobin: 12.6 g/dL (ref 12.0–15.0)
MCH: 32.1 pg (ref 26.0–34.0)
MCHC: 33.2 g/dL (ref 30.0–36.0)
MCV: 96.7 fL (ref 78.0–100.0)
Platelets: 256 10*3/uL (ref 150–400)
RBC: 3.92 MIL/uL (ref 3.87–5.11)
RDW: 14.1 % (ref 11.5–15.5)
WBC: 9.1 10*3/uL (ref 4.0–10.5)

## 2015-04-28 LAB — BASIC METABOLIC PANEL
Anion gap: 6 (ref 5–15)
BUN: 9 mg/dL (ref 6–20)
CO2: 22 mmol/L (ref 22–32)
Calcium: 8.7 mg/dL — ABNORMAL LOW (ref 8.9–10.3)
Chloride: 110 mmol/L (ref 101–111)
Creatinine, Ser: 0.8 mg/dL (ref 0.44–1.00)
GFR calc Af Amer: >60 mL/min (ref >60)
GFR calc non Af Amer: >60 mL/min (ref >60)
Glucose, Bld: 114 mg/dL — ABNORMAL HIGH (ref 65–99)
Potassium: 4.1 mmol/L (ref 3.5–5.1)
Sodium: 138 mmol/L (ref 135–145)

## 2015-04-28 MED ORDER — POLYETHYLENE GLYCOL 3350 17 G PO PACK
17.0000 g | PACK | Freq: Two times a day (BID) | ORAL | Status: DC
  Administered 2015-04-28: 17 g via ORAL
  Filled 2015-04-28 (×5): qty 1

## 2015-04-28 MED ORDER — GUAIFENESIN-DM 100-10 MG/5ML PO SYRP
10.0000 mL | ORAL_SOLUTION | ORAL | Status: DC | PRN
  Administered 2015-04-28 – 2015-04-30 (×4): 10 mL via ORAL
  Filled 2015-04-28 (×4): qty 10

## 2015-04-28 MED ORDER — BENZONATATE 100 MG PO CAPS
100.0000 mg | ORAL_CAPSULE | Freq: Three times a day (TID) | ORAL | Status: DC | PRN
  Administered 2015-04-29: 100 mg via ORAL
  Filled 2015-04-28: qty 1

## 2015-04-28 MED ORDER — BISACODYL 10 MG RE SUPP
10.0000 mg | Freq: Once | RECTAL | Status: AC
  Administered 2015-04-28: 10 mg via RECTAL
  Filled 2015-04-28: qty 1

## 2015-04-28 NOTE — Progress Notes (Signed)
[facility] PROGRESS NOTE  Theresa Garcia [id number] DOB: Dec 11, 1940 DOA: 04/26/2015 PCP: Tula Nakayama, MD    Code Status: Full code Family Communication: Discussed with her sister on 04/27/15 Disposition Plan: Discharge when clinically appropriate   Consultants:  Gen. surgery  Procedures:  NG tube placement 04/27/15 and removed 04/27/15  Antibiotics:  None  HPI/Subjective: Patient says her abdominal pain has subsided. She did pass gas, but no bowel movement yet. NG tube removed last night when she complained of discomfort.  Objective: Filed Vitals:   04/27/15 2143 04/28/15 0522  BP: 126/70 138/76  Pulse: 66 60  Temp: 98.7 F (37.1 C) 98.5 F (36.9 C)  Resp: 17 20   oxygen saturation 95%  Intake/Output Summary (Last 24 hours) at 04/28/15 1205 Last data filed at 04/27/15 1804  Gross per 24 hour  Intake      0 ml  Output      0 ml  Net      0 ml   Filed Weights   04/26/15 1305 04/26/15 1851  Weight: 88.451 kg (195 lb) 89.268 kg (196 lb 12.8 oz)    Exam:   General:  Alert 75 year old African-American woman who looks better today.  Cardiovascular: S1, S2, with a 2 to 3/6 systolic murmur.  Respiratory: Clear anteriorly with decreased breath sounds in the bases.  Abdomen: Positive bowel sounds, soft, mildly distended, mildly diffusely tender; no rigidity.  Musculoskeletal/extremity: No pedal edema or acute hot red joints.  Neurologic/psychiatric: She has a flat affect. She is alert and oriented 2. Cranial nerves II through XII are grossly intact.   Data Reviewed: Basic Metabolic Panel:  Recent Labs Lab 04/26/15 1315 04/28/15 0634  NA 143 138  K 3.9 4.1  CL 108 110  CO2 26 22  GLUCOSE 104* 114*  BUN 15 9  CREATININE 0.95 0.80  CALCIUM 9.8 8.7*   Liver Function Tests:  Recent Labs Lab 04/26/15 1315  AST 23  ALT 19  ALKPHOS 56  BILITOT 0.6  PROT 7.5  ALBUMIN 3.9    Recent Labs Lab 04/26/15 1315  LIPASE 23   No results  for input(s): AMMONIA in the last 168 hours. CBC:  Recent Labs Lab 04/26/15 1315 04/27/15 0604 04/28/15 0634  WBC 14.3* 11.2* 9.1  NEUTROABS 10.8*  --   --   HGB 14.8 13.2 12.6  HCT 43.9 40.4 37.9  MCV 95.4 96.4 96.7  PLT 292 275 256   Cardiac Enzymes: No results for input(s): CKTOTAL, CKMB, CKMBINDEX, TROPONINI in the last 168 hours. BNP (last 3 results) No results for input(s): BNP in the last 8760 hours.  ProBNP (last 3 results) No results for input(s): PROBNP in the last 8760 hours.  CBG: No results for input(s): GLUCAP in the last 168 hours.  No results found for this or any previous visit (from the past 240 hour(s)).   Studies: Dg Abd 1 View  04/27/2015  CLINICAL DATA:  Confirm nasogastric tube placement. EXAM: ABDOMEN - 1 VIEW COMPARISON:  None. FINDINGS: Nasogastric tube appears adequately positioned with tip in the stomach body region. Proximal side holes of the nasogastric tube located approximately 2.5 cm below the level of the GE junction. Mildly prominent gas-filled small bowel loops are appreciated within the midline abdomen and upper pelvis, compatible with the small bowel dilatation described on recent CT of 04/26/2015. Oral contrast material is present within the right colon indicating a partial small bowel obstruction. No evidence of free intraperitoneal air. No evidence of soft  tissue mass or abnormal fluid collection. IMPRESSION: 1. Nasogastric tube adequately positioned in the stomach, tip likely in the upper gastric body region. Proximal side holes located approximately 2.5 cm below the level of the GE junction. Could advance 5-10 cm for more optimal radiographic positioning. 2. Small bowel dilatation, compatible with yesterday's CT report description of partial small bowel obstruction. Electronically Signed   By: Franki Cabot M.D.   On: 04/27/2015 19:15   Ct Abdomen Pelvis W Contrast  04/26/2015  CLINICAL DATA:  Emesis and abdominal pain EXAM: CT ABDOMEN AND  PELVIS WITH CONTRAST TECHNIQUE: Multidetector CT imaging of the abdomen and pelvis was performed using the standard protocol following bolus administration of intravenous contrast. CONTRAST:  164mL OMNIPAQUE IOHEXOL 300 MG/ML SOLN, 6mL OMNIPAQUE IOHEXOL 300 MG/ML SOLN COMPARISON:  01/21/2015 FINDINGS: Some scarring is noted in the lung bases bilaterally. No focal infiltrate or sizable parenchymal nodule is noted. The liver is diffusely fatty infiltrated. The gallbladder, spleen, adrenal glands and pancreas are within normal limits. The kidneys are well visualized bilaterally without renal calculi or obstructive changes. Renal cystic changes noted on the right stable from the prior exam. The appendix is within normal limits. Scattered diverticular change of the colon is noted without evidence of diverticulitis. There are multiple dilated loops of distal jejunum and proximal ileum identified. A transition zone is noted in the right lower quadrant on image number 68 of series 2 although no definitive mass lesion is noted. This may be related to adhesions from prior surgery. Aortoiliac calcifications are noted. The bladder is well distended. The uterus has been surgically removed. Small amount of free pelvic fluid is noted. No free air is seen. IMPRESSION: Changes of partial small bowel obstruction in the distal jejunum and proximal ileum. A transition zone is noted in the right lower quadrant although no definitive mass lesion is seen. These changes are likely related to adhesions. Diverticulosis without evidence of diverticulitis. Fatty liver. Renal cystic change. Electronically Signed   By: Inez Catalina M.D.   On: 04/26/2015 15:40    Scheduled Meds: . bisacodyl  10 mg Rectal Once  . enoxaparin (LOVENOX) injection  40 mg Subcutaneous Q24H  . metoprolol  5 mg Intravenous 4 times per day  . pantoprazole (PROTONIX) IV  40 mg Intravenous Q12H  . polyethylene glycol  17 g Oral BID   Continuous Infusions: .  dextrose 5 % and 0.45 % NaCl with KCl 20 mEq/L 70 mL/hr at 04/28/15 0956   Assessment and plan:  Active Problems:   Essential hypertension   SBO (small bowel obstruction) (HCC)   GERD (gastroesophageal reflux disease)   1. Partial small bowel obstruction. Abdominal/pelvic CT on admission revealed changes of partial small bowel obstruction in the distal jejunum and proximal ileum; transition zone was noted in the right lower quadrant without definite mass lesion seen; diverticulosis without diverticulitis and fatty liver. -Patient refused NG tube placement in the ED. Her abdomen was moderately distended and tender.  -She was started on gentle IV fluids. Dextrose was added to the maintenance IV fluids. -She was encouraged to try the NG tube for decompression. It was placed on 04/27/15, but it did not drain any bilious fluid. The patient requested that it be removed and it was removed. -Gen. surgery consulted. Dr. Arnoldo Morale assessment and recommendations noted and appreciated; acute surgical intervention is not needed at this time. -Clear liquid diet was ordered by general surgery.  Essential hypertension. Patient is treated chronically with amlodipine/benazepril. She was started  on IV metoprolol due to nothing by mouth status. Her blood pressure is currently stable. -Continue current management. If she tolerates by mouth, will restart her oral medications.  GERD. Patient was started on IV Protonix. Currently stable. -If she is able to tolerate her clear liquids, will change Protonix to by mouth.   Time spent: 25 minutes    [facility] Pager [phone]. If 7PM-7AM, please contact night-coverage at [email or web address], password [facility] 04/28/2015, 12:05 PM  LOS: 2 days

## 2015-04-28 NOTE — Progress Notes (Signed)
Patient requested something for cough. Dr. Caryn Section notified, new order for Robitussin DM Q4 PRN and Tesalon Pearls TID PRN.

## 2015-04-28 NOTE — Progress Notes (Signed)
Subjective: Patient denies any abdominal pain. She states she has passed flatus, but no bowel movement. It is noted patient refused NG tube.  Objective: Vital signs in last 24 hours: Temp:  [98.5 F (36.9 C)-99 F (37.2 C)] 98.5 F (36.9 C) (03/05 0522) Pulse Rate:  [60-66] 60 (03/05 0522) Resp:  [17-20] 20 (03/05 0522) BP: (126-138)/(70-76) 138/76 mmHg (03/05 0522) SpO2:  [94 %-95 %] 95 % (03/05 0522)    Intake/Output from previous day:   Intake/Output this shift:    General appearance: alert and no distress GI: Soft, nontender, nondistended. Minimal bowel sounds appreciated. No rigidity noted.  Lab Results:   Recent Labs  04/27/15 0604 04/28/15 0634  WBC 11.2* 9.1  HGB 13.2 12.6  HCT 40.4 37.9  PLT 275 256   BMET  Recent Labs  04/26/15 1315 04/28/15 0634  NA 143 138  K 3.9 4.1  CL 108 110  CO2 26 22  GLUCOSE 104* 114*  BUN 15 9  CREATININE 0.95 0.80  CALCIUM 9.8 8.7*   PT/INR No results for input(s): LABPROT, INR in the last 72 hours.  Studies/Results: Dg Abd 1 View  04/27/2015  CLINICAL DATA:  Confirm nasogastric tube placement. EXAM: ABDOMEN - 1 VIEW COMPARISON:  None. FINDINGS: Nasogastric tube appears adequately positioned with tip in the stomach body region. Proximal side holes of the nasogastric tube located approximately 2.5 cm below the level of the GE junction. Mildly prominent gas-filled small bowel loops are appreciated within the midline abdomen and upper pelvis, compatible with the small bowel dilatation described on recent CT of 04/26/2015. Oral contrast material is present within the right colon indicating a partial small bowel obstruction. No evidence of free intraperitoneal air. No evidence of soft tissue mass or abnormal fluid collection. IMPRESSION: 1. Nasogastric tube adequately positioned in the stomach, tip likely in the upper gastric body region. Proximal side holes located approximately 2.5 cm below the level of the GE junction. Could  advance 5-10 cm for more optimal radiographic positioning. 2. Small bowel dilatation, compatible with yesterday's CT report description of partial small bowel obstruction. Electronically Signed   By: Franki Cabot M.D.   On: 04/27/2015 19:15   Ct Abdomen Pelvis W Contrast  04/26/2015  CLINICAL DATA:  Emesis and abdominal pain EXAM: CT ABDOMEN AND PELVIS WITH CONTRAST TECHNIQUE: Multidetector CT imaging of the abdomen and pelvis was performed using the standard protocol following bolus administration of intravenous contrast. CONTRAST:  149mL OMNIPAQUE IOHEXOL 300 MG/ML SOLN, 28mL OMNIPAQUE IOHEXOL 300 MG/ML SOLN COMPARISON:  01/21/2015 FINDINGS: Some scarring is noted in the lung bases bilaterally. No focal infiltrate or sizable parenchymal nodule is noted. The liver is diffusely fatty infiltrated. The gallbladder, spleen, adrenal glands and pancreas are within normal limits. The kidneys are well visualized bilaterally without renal calculi or obstructive changes. Renal cystic changes noted on the right stable from the prior exam. The appendix is within normal limits. Scattered diverticular change of the colon is noted without evidence of diverticulitis. There are multiple dilated loops of distal jejunum and proximal ileum identified. A transition zone is noted in the right lower quadrant on image number 68 of series 2 although no definitive mass lesion is noted. This may be related to adhesions from prior surgery. Aortoiliac calcifications are noted. The bladder is well distended. The uterus has been surgically removed. Small amount of free pelvic fluid is noted. No free air is seen. IMPRESSION: Changes of partial small bowel obstruction in the distal jejunum and proximal  ileum. A transition zone is noted in the right lower quadrant although no definitive mass lesion is seen. These changes are likely related to adhesions. Diverticulosis without evidence of diverticulitis. Fatty liver. Renal cystic change.  Electronically Signed   By: Inez Catalina M.D.   On: 04/26/2015 15:40    Anti-infectives: Anti-infectives    None      Assessment/Plan: Impression: Small bowel obstruction. Patient refused NG tube. Will try MiraLAX. No need for acute surgical intervention. Patient is adamant on not undergoing surgery.  LOS: 2 days    Donell Sliwinski A 04/28/2015

## 2015-04-29 ENCOUNTER — Ambulatory Visit (HOSPITAL_COMMUNITY): Admission: RE | Admit: 2015-04-29 | Payer: Medicare Other | Source: Ambulatory Visit

## 2015-04-29 LAB — BASIC METABOLIC PANEL
Anion gap: 6 (ref 5–15)
BUN: 7 mg/dL (ref 6–20)
CO2: 23 mmol/L (ref 22–32)
Calcium: 8.9 mg/dL (ref 8.9–10.3)
Chloride: 111 mmol/L (ref 101–111)
Creatinine, Ser: 0.82 mg/dL (ref 0.44–1.00)
GFR calc Af Amer: >60 mL/min (ref >60)
GFR calc non Af Amer: >60 mL/min (ref >60)
Glucose, Bld: 104 mg/dL — ABNORMAL HIGH (ref 65–99)
Potassium: 4.1 mmol/L (ref 3.5–5.1)
Sodium: 140 mmol/L (ref 135–145)

## 2015-04-29 MED ORDER — AMLODIPINE BESYLATE 5 MG PO TABS
5.0000 mg | ORAL_TABLET | Freq: Every day | ORAL | Status: DC
  Administered 2015-04-29 – 2015-04-30 (×2): 5 mg via ORAL
  Filled 2015-04-29 (×2): qty 1

## 2015-04-29 MED ORDER — FUROSEMIDE 10 MG/ML IJ SOLN
10.0000 mg | Freq: Once | INTRAMUSCULAR | Status: AC
  Administered 2015-04-29: 10 mg via INTRAVENOUS
  Filled 2015-04-29: qty 2

## 2015-04-29 MED ORDER — PANTOPRAZOLE SODIUM 40 MG PO TBEC
40.0000 mg | DELAYED_RELEASE_TABLET | Freq: Two times a day (BID) | ORAL | Status: DC
  Administered 2015-04-29 – 2015-04-30 (×2): 40 mg via ORAL
  Filled 2015-04-29 (×2): qty 1

## 2015-04-29 NOTE — Progress Notes (Signed)
[facility] PROGRESS NOTE  Theresa Garcia [id number] DOB: 07/07/1940 DOA: 04/26/2015 PCP: Tula Nakayama, MD    Code Status: Full code Family Communication: Discussed with her sister and brother on 04/27/15; discuss with sister on 04/29/15 Disposition Plan: Discharge when clinically appropriate, likely in the next 24 hours.   Consultants:  Gen. surgery  Procedures:  NG tube placement 04/27/15 and removed 04/27/15  Antibiotics:  None  HPI/Subjective: Patient's diet was advanced after she was noted to have 2 bowel movements last night. She is tolerating it well. She complains of some facial swelling which she says has been going on for several months.  Objective: Filed Vitals:   04/28/15 2159 04/29/15 1400  BP: 117/60 123/76  Pulse: 73 81  Temp: 98.7 F (37.1 C) 98.4 F (36.9 C)  Resp: 17 20   oxygen saturation 96%  Intake/Output Summary (Last 24 hours) at 04/29/15 1545 Last data filed at 04/29/15 0929  Gross per 24 hour  Intake    120 ml  Output      0 ml  Net    120 ml   Filed Weights   04/26/15 1305 04/26/15 1851  Weight: 88.451 kg (195 lb) 89.268 kg (196 lb 12.8 oz)    Exam:   General:  Alert 75 year old African-American woman who looks better today.  Face: I cannot detect any facial swelling or any peripheral edema. Oropharynx reveals no posterior pharyngeal exudates edema, or erythema.  Cardiovascular: S1, S2, with a 2 to 3/6 systolic murmur.  Respiratory: Clear anteriorly with decreased breath sounds in the bases.  Abdomen: Positive bowel sounds, soft, minimal tenderness, soft, nondistended.  Musculoskeletal/extremity: No pedal edema or acute hot red joints.  Neurologic/psychiatric: She has a flat affect. She is alert and oriented 2. Cranial nerves II through XII are grossly intact.   Data Reviewed: Basic Metabolic Panel:  Recent Labs Lab 04/26/15 1315 04/28/15 0634 04/29/15 0623  NA 143 138 140  K 3.9 4.1 4.1  CL 108 110 111   CO2 26 22 23   GLUCOSE 104* 114* 104*  BUN 15 9 7   CREATININE 0.95 0.80 0.82  CALCIUM 9.8 8.7* 8.9   Liver Function Tests:  Recent Labs Lab 04/26/15 1315  AST 23  ALT 19  ALKPHOS 56  BILITOT 0.6  PROT 7.5  ALBUMIN 3.9    Recent Labs Lab 04/26/15 1315  LIPASE 23   No results for input(s): AMMONIA in the last 168 hours. CBC:  Recent Labs Lab 04/26/15 1315 04/27/15 0604 04/28/15 0634  WBC 14.3* 11.2* 9.1  NEUTROABS 10.8*  --   --   HGB 14.8 13.2 12.6  HCT 43.9 40.4 37.9  MCV 95.4 96.4 96.7  PLT 292 275 256   Cardiac Enzymes: No results for input(s): CKTOTAL, CKMB, CKMBINDEX, TROPONINI in the last 168 hours. BNP (last 3 results) No results for input(s): BNP in the last 8760 hours.  ProBNP (last 3 results) No results for input(s): PROBNP in the last 8760 hours.  CBG: No results for input(s): GLUCAP in the last 168 hours.  No results found for this or any previous visit (from the past 240 hour(s)).   Studies: Dg Abd 1 View  04/27/2015  CLINICAL DATA:  Confirm nasogastric tube placement. EXAM: ABDOMEN - 1 VIEW COMPARISON:  None. FINDINGS: Nasogastric tube appears adequately positioned with tip in the stomach body region. Proximal side holes of the nasogastric tube located approximately 2.5 cm below the level of the GE junction. Mildly prominent gas-filled small bowel  loops are appreciated within the midline abdomen and upper pelvis, compatible with the small bowel dilatation described on recent CT of 04/26/2015. Oral contrast material is present within the right colon indicating a partial small bowel obstruction. No evidence of free intraperitoneal air. No evidence of soft tissue mass or abnormal fluid collection. IMPRESSION: 1. Nasogastric tube adequately positioned in the stomach, tip likely in the upper gastric body region. Proximal side holes located approximately 2.5 cm below the level of the GE junction. Could advance 5-10 cm for more optimal radiographic  positioning. 2. Small bowel dilatation, compatible with yesterday's CT report description of partial small bowel obstruction. Electronically Signed   By: Franki Cabot M.D.   On: 04/27/2015 19:15    Scheduled Meds: . amLODipine  5 mg Oral Daily  . enoxaparin (LOVENOX) injection  40 mg Subcutaneous Q24H  . pantoprazole  40 mg Oral BID  . polyethylene glycol  17 g Oral BID   Continuous Infusions:   Assessment and plan:  Active Problems:   Essential hypertension   SBO (small bowel obstruction) (HCC)   GERD (gastroesophageal reflux disease)   1. Partial small bowel obstruction. Abdominal/pelvic CT on admission revealed changes of partial small bowel obstruction in the distal jejunum and proximal ileum; transition zone was noted in the right lower quadrant without definite mass lesion seen; diverticulosis without diverticulitis and fatty liver. -Patient refused NG tube placement in the ED. Her abdomen was moderately distended and tender.  -She was started on gentle IV fluids. Dextrose was added to the maintenance IV fluids. -She was encouraged to try the NG tube for decompression. It was placed on 04/27/15, but it did not drain any bilious fluid. The patient requested that it be removed and it was removed. -Gen. surgery consulted. Dr. Arnoldo Morale assessment and recommendations noted and appreciated. Per his assessment, acute surgical intervention was not needed. -Patient's abdominal pain subsided. A clear liquid diet was started. She had 2 bowel movements. Her diet was then advanced to soft which she has been tolerating. Patient was instructed to ambulate as much as tolerated. -We will monitor for another 24 hours for GI symptomatology.  ?Symptomatic facial swelling. The patient and her sister tell me that this has been going on for months. Apparently, her PCP had ordered an ultrasound of the face. On exam, I see no facial swelling. There is no angioedema of her face or lips or oropharynx. There is  no peripheral edema in her extremities. Nevertheless, the IV fluids were discontinued and she was given 1 empiric dose of Lasix. -I decided not to restart benazepril due to this nondescript symptomatic facial swelling that is not noticeable per my exam.   Essential hypertension. Patient is treated chronically with amlodipine/benazepril. She was started on IV metoprolol due to nothing by mouth status. Her blood pressure is currently stable. -IV metoprolol discontinued. We'll restart amlodipine. Will hold on benazepril given the patient's reported facial swelling.  GERD. Patient was started on IV Protonix. It was changed by mouth. Currently stable.   Time spent: 25 minutes    [facility] Pager [phone]. If 7PM-7AM, please contact night-coverage at [email or web address], password [facility] 04/29/2015, 3:45 PM  LOS: 3 days

## 2015-04-29 NOTE — Progress Notes (Signed)
Patient has had two bowel movements tonight.

## 2015-04-29 NOTE — Progress Notes (Signed)
  Subjective: Patient has started moving her bowels. Tolerating clear liquid diet well.  Objective: Vital signs in last 24 hours: Temp:  [98.7 F (37.1 C)] 98.7 F (37.1 C) (03/05 2159) Pulse Rate:  [73] 73 (03/05 2159) Resp:  [17] 17 (03/05 2159) BP: (117)/(60) 117/60 mmHg (03/05 2159) SpO2:  [94 %] 94 % (03/05 2159)    Intake/Output from previous day: 03/05 0701 - 03/06 0700 In: 240 [P.O.:240] Out: -  Intake/Output this shift:    General appearance: alert, cooperative and no distress GI: soft, non-tender; bowel sounds normal; no masses,  no organomegaly  Lab Results:   Recent Labs  04/27/15 0604 04/28/15 0634  WBC 11.2* 9.1  HGB 13.2 12.6  HCT 40.4 37.9  PLT 275 256   BMET  Recent Labs  04/28/15 0634 04/29/15 0623  NA 138 140  K 4.1 4.1  CL 110 111  CO2 22 23  GLUCOSE 114* 104*  BUN 9 7  CREATININE 0.80 0.82  CALCIUM 8.7* 8.9   PT/INR No results for input(s): LABPROT, INR in the last 72 hours.  Studies/Results: Dg Abd 1 View  04/27/2015  CLINICAL DATA:  Confirm nasogastric tube placement. EXAM: ABDOMEN - 1 VIEW COMPARISON:  None. FINDINGS: Nasogastric tube appears adequately positioned with tip in the stomach body region. Proximal side holes of the nasogastric tube located approximately 2.5 cm below the level of the GE junction. Mildly prominent gas-filled small bowel loops are appreciated within the midline abdomen and upper pelvis, compatible with the small bowel dilatation described on recent CT of 04/26/2015. Oral contrast material is present within the right colon indicating a partial small bowel obstruction. No evidence of free intraperitoneal air. No evidence of soft tissue mass or abnormal fluid collection. IMPRESSION: 1. Nasogastric tube adequately positioned in the stomach, tip likely in the upper gastric body region. Proximal side holes located approximately 2.5 cm below the level of the GE junction. Could advance 5-10 cm for more optimal  radiographic positioning. 2. Small bowel dilatation, compatible with yesterday's CT report description of partial small bowel obstruction. Electronically Signed   By: Franki Cabot M.D.   On: 04/27/2015 19:15    Anti-infectives: Anti-infectives    None      Assessment/Plan: Impression: Small bowel obstruction, resolving. No need for surgical intervention. Plan: Will advance to soft diet. Patient desires discharge, which is fine with me.  LOS: 3 days    Prentis Langdon A 04/29/2015

## 2015-04-30 DIAGNOSIS — Z0189 Encounter for other specified special examinations: Secondary | ICD-10-CM

## 2015-04-30 LAB — BASIC METABOLIC PANEL
Anion gap: 8 (ref 5–15)
BUN: 15 mg/dL (ref 6–20)
CO2: 23 mmol/L (ref 22–32)
Calcium: 8.8 mg/dL — ABNORMAL LOW (ref 8.9–10.3)
Chloride: 108 mmol/L (ref 101–111)
Creatinine, Ser: 0.84 mg/dL (ref 0.44–1.00)
GFR calc Af Amer: >60 mL/min (ref >60)
GFR calc non Af Amer: >60 mL/min (ref >60)
Glucose, Bld: 92 mg/dL (ref 65–99)
Potassium: 3.9 mmol/L (ref 3.5–5.1)
Sodium: 139 mmol/L (ref 135–145)

## 2015-04-30 NOTE — Care Management Important Message (Signed)
Important Message  Patient Details  Name: Theresa Garcia MRN: [id number] Date of Birth: 03-20-1940   Medicare Important Message Given:  Yes    Sherald Barge, RN 04/30/2015, 4:17 PM

## 2015-04-30 NOTE — Discharge Summary (Signed)
Physician Discharge Summary  Theresa Garcia [id number] DOB: 26-Aug-1940 DOA: 04/26/2015  PCP: Tula Nakayama, MD  Admit date: 04/26/2015 Discharge date: 04/30/2015  Time spent: Greater than 30 minutes  Recommendations for Outpatient Follow-up:  1.    Discharge Diagnoses:  1. Partial small bowel obstruction, resolved medically. 2. Essential hypertension. 3. GERD.  Discharge Condition: Improved  Diet recommendation: Heart healthy  Filed Weights   04/26/15 1305 04/26/15 1851  Weight: 88.451 kg (195 lb) 89.268 kg (196 lb 12.8 oz)    History of present illness:  Patient is a 75 year old woman with a history of breast cancer, status post lumpectomy, chemotherapy, and radiation, hypertension, and GERD, who presented to the emergency department on 04/26/15 with a chief complaint of nausea and vomiting. In the ED she was afebrile and hemodynamically stable. CT of her abdomen and pelvis revealed changes of partial small bowel obstruction in the distal jejunum and proximal ileum with a transition zone in the right lower quadrant without no definite mass seen; fatty liver; diverticulosis without diverticulitis, and renal cystic change. She was admitted for further evaluation and management.  Hospital Course:  1. Partial small bowel obstruction. Abdominal/pelvic CT on admission revealed changes of partial small bowel obstruction in the distal jejunum and proximal ileum; transition zone was noted in the right lower quadrant without definite mass lesion seen; diverticulosis without diverticulitis and fatty liver. -Patient refused NG tube placement in the ED. Her abdomen was moderately distended and tender.  -She was started on gentle IV fluids. Dextrose was added to the maintenance IV fluids. -She was encouraged to try the NG tube for decompression. It was placed on 04/27/15, but it did not drain any bilious fluid. The patient requested that it be removed and it was removed. -General surgeon, Dr.  Arnoldo Morale was consulted. consulted.  Per his assessment, acute surgical intervention was not needed. -Patient's abdominal pain subsided. A clear liquid diet was started. She had 2 bowel movements. Her diet was then advanced to soft which she tolerated well. Her partial small bowel obstruction resolved medically.  Query facial swelling. The patient and her sister told me that this has been going on for months. Apparently, her PCP had ordered an ultrasound of the face. On exam, I saw no facial swelling. There was no angioedema of her face or lips or oropharynx. There was no peripheral edema in her extremities. Nevertheless, the IV fluids were discontinued and she was given 1 empiric dose of Lasix. She will follow-up with her PCP, Dr. Moshe Cipro for further evaluation and management.   Essential hypertension. Patient is treated chronically with amlodipine/benazepril. She was started on IV metoprolol due to nothing by mouth status. Her blood pressure was relatively stable during the hospital course. When she was no longer nothing by mouth, amlodipine was restarted. Benazepril was withheld due to her reported facial swelling. But it was restarted upon discharge when there was no obvious swelling or angioedema on exam.  GERD. She was continued on PPI therapy.  Procedures:  NG tube placement and discontinuation.  Consultations:  General surgeon, Dr. Arnoldo Morale.  Discharge Exam: Filed Vitals:   04/29/15 2021 04/30/15 0700  BP: 148/83 124/66  Pulse: 72 64  Temp: 99 F (37.2 C) 98.8 F (37.1 C)  Resp: 20 18    General: Alert 75 year old African-American woman in no acute distress.  Face: No evidence of any facial swelling or any peripheral edema. Oropharynx reveals no posterior pharyngeal exudates edema, or erythema.  Cardiovascular: S1, S2, with a 2  to 3/6 systolic murmur.  Respiratory: Clear anteriorly with decreased breath sounds in the bases.  Abdomen: Positive bowel sounds, soft,  nontender  nondistended.  Musculoskeletal/extremity: No pedal edema or acute hot red joints.  Neurologic/psychiatric: She has a flat affect. She is alert and oriented 2. Cranial nerves II through XII are grossly intact.   Discharge Instructions   Discharge Instructions    Diet - low sodium heart healthy    Complete by:  As directed      Increase activity slowly    Complete by:  As directed           Current Discharge Medication List    CONTINUE these medications which have NOT CHANGED   Details  acyclovir (ZOVIRAX) 400 MG tablet TAKE ONE TABLET BY MOUTH TWICE DAILY Qty: 60 tablet, Refills: 2    amLODipine-benazepril (LOTREL) 5-40 MG capsule Take 1 capsule by mouth daily. Qty: 90 capsule, Refills: 1    aspirin 81 MG tablet Take 81 mg by mouth daily.    atorvastatin (LIPITOR) 20 MG tablet Take 1 tablet (20 mg total) by mouth every evening. Qty: 90 tablet, Refills: 1    clotrimazole-betamethasone (LOTRISONE) cream Apply 1 application topically 2 (two) times daily. For 10 days Qty: 30 g, Refills: 0    Cyanocobalamin (B-12) 1000 MCG CAPS Take 1 capsule by mouth daily.    Diclofenac-Misoprostol 75-0.2 MG TBEC Take 1 tablet by mouth daily.     montelukast (SINGULAIR) 10 MG tablet Take 1 tablet (10 mg total) by mouth at bedtime. Qty: 30 tablet, Refills: 3   Associated Diagnoses: Allergic rhinitis, unspecified allergic rhinitis type    temazepam (RESTORIL) 30 MG capsule Take 1 capsule (30 mg total) by mouth at bedtime as needed. Qty: 90 capsule, Refills: 1    vitamin C (ASCORBIC ACID) 500 MG tablet Take 500 mg by mouth daily.       Allergies  Allergen Reactions  . Meclizine Diarrhea  . Penicillins Rash and Other (See Comments)    Has patient had a PCN reaction causing immediate rash, facial/tongue/throat swelling, SOB or lightheadedness with hypotension: Yes Has patient had a PCN reaction causing severe rash involving mucus membranes or skin necrosis: No Has patient  had a PCN reaction that required hospitalization No Has patient had a PCN reaction occurring within the last 10 years: No If all of the above answers are "NO", then may proceed with Cephalosporin use.    Follow-up Information    Follow up with [facility] On 05/09/2015.   Why:  At 10:30 am for Ultrasound of Head/Neck for Facial Swelling      Follow up with Tula Nakayama, MD. Schedule an appointment as soon as possible for a visit in 1 week.   Specialty:  Family Medicine   Contact information:   [address] [place] [address] [phone]        The results of significant diagnostics from this hospitalization (including imaging, microbiology, ancillary and laboratory) are listed below for reference.    Significant Diagnostic Studies: Dg Sinus 1-2 Views  04/24/2015  CLINICAL DATA:  Left-sided facial swelling EXAM: PARANASAL SINUSES - 1-2 VIEW COMPARISON:  None. FINDINGS: Water's and lateral views obtained. Paranasal sinuses are clear. No air-fluid level. No bony destruction or expansion. Nasal septum midline. Mastoids appear clear bilaterally. IMPRESSION: Paranasal sinuses clear.  No bony abnormality.  No air-fluid levels. Electronically Signed   By: Lowella Grip III M.D.   On: 04/24/2015 13:32  Dg Chest 2 View  04/24/2015  CLINICAL DATA:  Productive cough and cold for 6 weeks. Personal history of breast carcinoma. EXAM: CHEST  2 VIEW COMPARISON:  01/24/2013 FINDINGS: The heart size and mediastinal contours are within normal limits. Both lungs are clear. No evidence of pneumothorax or pleural effusion. Surgical clips again seen in left axilla. IMPRESSION: No active cardiopulmonary disease. Electronically Signed   By: Earle Gell M.D.   On: 04/24/2015 13:33   Dg Abd 1 View  04/27/2015  CLINICAL DATA:  Confirm nasogastric tube placement. EXAM: ABDOMEN - 1 VIEW COMPARISON:  None. FINDINGS: Nasogastric tube appears adequately positioned with tip in the stomach body  region. Proximal side holes of the nasogastric tube located approximately 2.5 cm below the level of the GE junction. Mildly prominent gas-filled small bowel loops are appreciated within the midline abdomen and upper pelvis, compatible with the small bowel dilatation described on recent CT of 04/26/2015. Oral contrast material is present within the right colon indicating a partial small bowel obstruction. No evidence of free intraperitoneal air. No evidence of soft tissue mass or abnormal fluid collection. IMPRESSION: 1. Nasogastric tube adequately positioned in the stomach, tip likely in the upper gastric body region. Proximal side holes located approximately 2.5 cm below the level of the GE junction. Could advance 5-10 cm for more optimal radiographic positioning. 2. Small bowel dilatation, compatible with yesterday's CT report description of partial small bowel obstruction. Electronically Signed   By: Franki Cabot M.D.   On: 04/27/2015 19:15   Ct Abdomen Pelvis W Contrast  04/26/2015  CLINICAL DATA:  Emesis and abdominal pain EXAM: CT ABDOMEN AND PELVIS WITH CONTRAST TECHNIQUE: Multidetector CT imaging of the abdomen and pelvis was performed using the standard protocol following bolus administration of intravenous contrast. CONTRAST:  164mL OMNIPAQUE IOHEXOL 300 MG/ML SOLN, 40mL OMNIPAQUE IOHEXOL 300 MG/ML SOLN COMPARISON:  01/21/2015 FINDINGS: Some scarring is noted in the lung bases bilaterally. No focal infiltrate or sizable parenchymal nodule is noted. The liver is diffusely fatty infiltrated. The gallbladder, spleen, adrenal glands and pancreas are within normal limits. The kidneys are well visualized bilaterally without renal calculi or obstructive changes. Renal cystic changes noted on the right stable from the prior exam. The appendix is within normal limits. Scattered diverticular change of the colon is noted without evidence of diverticulitis. There are multiple dilated loops of distal jejunum and  proximal ileum identified. A transition zone is noted in the right lower quadrant on image number 68 of series 2 although no definitive mass lesion is noted. This may be related to adhesions from prior surgery. Aortoiliac calcifications are noted. The bladder is well distended. The uterus has been surgically removed. Small amount of free pelvic fluid is noted. No free air is seen. IMPRESSION: Changes of partial small bowel obstruction in the distal jejunum and proximal ileum. A transition zone is noted in the right lower quadrant although no definitive mass lesion is seen. These changes are likely related to adhesions. Diverticulosis without evidence of diverticulitis. Fatty liver. Renal cystic change. Electronically Signed   By: Inez Catalina M.D.   On: 04/26/2015 15:40    Microbiology: No results found for this or any previous visit (from the past 240 hour(s)).   Labs: Basic Metabolic Panel:  Recent Labs Lab 04/26/15 1315 04/28/15 0634 04/29/15 0623 04/30/15 0606  NA 143 138 140 139  K 3.9 4.1 4.1 3.9  CL 108 110 111 108  CO2 26 22 23 23   GLUCOSE 104*  114* 104* 92  BUN 15 9 7 15   CREATININE 0.95 0.80 0.82 0.84  CALCIUM 9.8 8.7* 8.9 8.8*   Liver Function Tests:  Recent Labs Lab 04/26/15 1315  AST 23  ALT 19  ALKPHOS 56  BILITOT 0.6  PROT 7.5  ALBUMIN 3.9    Recent Labs Lab 04/26/15 1315  LIPASE 23   No results for input(s): AMMONIA in the last 168 hours. CBC:  Recent Labs Lab 04/26/15 1315 04/27/15 0604 04/28/15 0634  WBC 14.3* 11.2* 9.1  NEUTROABS 10.8*  --   --   HGB 14.8 13.2 12.6  HCT 43.9 40.4 37.9  MCV 95.4 96.4 96.7  PLT 292 275 256   Cardiac Enzymes: No results for input(s): CKTOTAL, CKMB, CKMBINDEX, TROPONINI in the last 168 hours. BNP: BNP (last 3 results) No results for input(s): BNP in the last 8760 hours.  ProBNP (last 3 results) No results for input(s): PROBNP in the last 8760 hours.  CBG: No results for input(s): GLUCAP in the last 168  hours.     Signed:  Barbee Mamula MD.  [facility] 04/30/2015, 12:55 PM

## 2015-04-30 NOTE — Care Management Note (Signed)
Case Management Note  Patient Details  Name: Theresa Garcia MRN: [id number] Date of Birth: Jul 19, 1940  Subjective/Objective:                  Pt admitted with SBO. Pt is from home, ind with ADL's. Pt has no HH services or DME prior to admission.   Action/Plan: Pt discharged home today. No CM needs.   Expected Discharge Date:  04/30/15               Expected Discharge Plan:  Home/Self Care  In-House Referral:  NA  Discharge planning Services  CM Consult  Post Acute Care Choice:  NA Choice offered to:  NA  DME Arranged:    DME Agency:     HH Arranged:    HH Agency:     Status of Service:  Completed, signed off  Medicare Important Message Given:  Yes Date Medicare IM Given:    Medicare IM give by:    Date Additional Medicare IM Given:    Additional Medicare Important Message give by:     If discussed at [place] of Stay Meetings, dates discussed:    Additional Comments:  Sherald Barge, RN 04/30/2015, 4:17 PM

## 2015-04-30 NOTE — Progress Notes (Signed)
Pt c/o chest pain. VSS , EKG showing Normal Sinus. MD paged with no new orders at this time.

## 2015-04-30 NOTE — Progress Notes (Signed)
Discharge instructions given, verbalized understanding, out in stable condition via w/c with staff. 

## 2015-04-30 NOTE — Progress Notes (Signed)
  Subjective: Patient complains of cough. No abdominal pain noted. Tolerating regular diet well.  Objective: Vital signs in last 24 hours: Temp:  [98.4 F (36.9 C)-99 F (37.2 C)] 98.8 F (37.1 C) (03/07 0700) Pulse Rate:  [64-81] 64 (03/07 0700) Resp:  [18-20] 18 (03/07 0700) BP: (123-148)/(66-83) 124/66 mmHg (03/07 0700) SpO2:  [95 %-97 %] 95 % (03/07 0700) Last BM Date: 04/29/15  Intake/Output from previous day: 03/06 0701 - 03/07 0700 In: 1172 [P.O.:840; I.V.:332] Out: -  Intake/Output this shift:    General appearance: alert, cooperative and no distress GI: soft, non-tender; bowel sounds normal; no masses,  no organomegaly  Lab Results:   Recent Labs  04/28/15 0634  WBC 9.1  HGB 12.6  HCT 37.9  PLT 256   BMET  Recent Labs  04/29/15 0623 04/30/15 0606  NA 140 139  K 4.1 3.9  CL 111 108  CO2 23 23  GLUCOSE 104* 92  BUN 7 15  CREATININE 0.82 0.84  CALCIUM 8.9 8.8*   PT/INR No results for input(s): LABPROT, INR in the last 72 hours.  Studies/Results: No results found.  Anti-infectives: Anti-infectives    None      Assessment/Plan: Impression: Small bowel obstruction, resolved Plan: We will sign off. Please call me if I could be of further assistance.  LOS: 4 days    Rashaunda Rahl A 04/30/2015

## 2015-05-06 ENCOUNTER — Other Ambulatory Visit: Payer: Self-pay | Admitting: Family Medicine

## 2015-05-06 DIAGNOSIS — Z1231 Encounter for screening mammogram for malignant neoplasm of breast: Secondary | ICD-10-CM

## 2015-05-07 ENCOUNTER — Encounter: Payer: Self-pay | Admitting: Family Medicine

## 2015-05-07 ENCOUNTER — Ambulatory Visit (INDEPENDENT_AMBULATORY_CARE_PROVIDER_SITE_OTHER): Payer: Medicare Other | Admitting: Family Medicine

## 2015-05-07 VITALS — BP 130/84 | HR 88 | Resp 16 | Ht 67.0 in | Wt 192.1 lb

## 2015-05-07 DIAGNOSIS — I10 Essential (primary) hypertension: Secondary | ICD-10-CM | POA: Diagnosis not present

## 2015-05-07 DIAGNOSIS — N3000 Acute cystitis without hematuria: Secondary | ICD-10-CM | POA: Diagnosis not present

## 2015-05-07 DIAGNOSIS — N3001 Acute cystitis with hematuria: Secondary | ICD-10-CM | POA: Diagnosis not present

## 2015-05-07 DIAGNOSIS — F418 Other specified anxiety disorders: Secondary | ICD-10-CM

## 2015-05-07 DIAGNOSIS — E559 Vitamin D deficiency, unspecified: Secondary | ICD-10-CM | POA: Diagnosis not present

## 2015-05-07 DIAGNOSIS — J3089 Other allergic rhinitis: Secondary | ICD-10-CM

## 2015-05-07 DIAGNOSIS — G4700 Insomnia, unspecified: Secondary | ICD-10-CM

## 2015-05-07 DIAGNOSIS — Z09 Encounter for follow-up examination after completed treatment for conditions other than malignant neoplasm: Secondary | ICD-10-CM

## 2015-05-07 DIAGNOSIS — R5381 Other malaise: Secondary | ICD-10-CM

## 2015-05-07 LAB — POCT URINALYSIS DIPSTICK
Bilirubin, UA: NEGATIVE
Glucose, UA: NEGATIVE
Ketones, UA: NEGATIVE
Nitrite, UA: NEGATIVE
Protein, UA: NEGATIVE
Spec Grav, UA: 1.02
Urobilinogen, UA: 0.2
pH, UA: 6.5

## 2015-05-07 MED ORDER — FLUTICASONE PROPIONATE 50 MCG/ACT NA SUSP: 2.0000 | Freq: Every day | NASAL | Status: DC

## 2015-05-07 MED ORDER — ERGOCALCIFEROL 1.25 MG (50000 UT) PO CAPS: 50000.0000 [IU] | ORAL_CAPSULE | ORAL | Status: DC

## 2015-05-07 NOTE — Patient Instructions (Addendum)
F/u first week in May , call if you need me sooner  Thankful that you have improved greatly  New additional medication, flonase for allergy management, continue daily singulair, this will help your cough  Need once weekly vitamin D capsule, this is prescribed   Labs needed, CBC,, cmp, TSH  You are referred for home  weekly and PT twice weekly for 4 weeks  Urine being checked today  Thanks for choosing [facility], we consider it a privelige to serve you.

## 2015-05-07 NOTE — Progress Notes (Signed)
   Subjective:    Patient ID: Theresa Garcia, female    DOB: 08-27-1940, 75 y.o.   MRN: [id number]  HPI Patient in for follow up of recent hospitalization. Discharge summary, and laboratory and radiology data are reviewed, and any questions or concerns about recent hospitalization are discussed. Specific issues requiring follow up are specifically addressed. C/o generalized weakness, needs assistance with ADL's per caregivers who are family members, will benefit from and needs in home Pt, she Garcia essentially immobile for her entire hospital stay and is also experiencing increased anxiety and fear    Review of Systems See HPI Denies recent fever or chills. Denies sinus pressure, nasal congestion, ear pain or sore throat. Denies chest congestion, productive cough or wheezing. Denies chest pains, palpitations and leg swelling Denies abdominal pain, nausea, vomiting,diarrhea or constipation.   C/o mild  Dysuria and  frequency, denies flank pain or suprapubic pain Denies joint pain, swelling  Denies headaches, seizures, numbness, or tingling. . Denies skin break down or rash.        Objective:   Physical Exam BP 130/84 mmHg  Pulse 88  Resp 16  Ht 5\' 7"  (1.702 m)  Wt 192 lb 1.9 oz (87.145 kg)  BMI 30.08 kg/m2  SpO2 97%  Patient alert  and in no cardiopulmonary distress.Lying flat, anxious and depressed appearing, at times tearful, states that she "feels weak"  HEENT: No facial asymmetry, EOMI,   oropharynx pink and moist.  Neck supple no JVD, no mass.  Chest: Clear to auscultation bilaterally.  CVS: S1, S2 no murmurs, no S3.Regular rate.  ABD: Soft non tender. Normal BS. Not distended, no organomegaly or mass. No renalangle or suprapubic tenderness  Ext: No edema  MS: Adequate ROM spine, shoulders, hips and knees.  Skin: Intact, no ulcerations or rash noted.  Psych: Good eye contact, flat  affect. Memory intact both  anxious and  depressed appearing.  CNS: CN 2-12  intact, power,  normal throughout.no focal deficits noted.Deceased muscle tone in all extremities       Assessment & Plan:  Hospital discharge follow-up Denies abdominal pain, bloating o, nausea or emesis. Fair appetite and BM normal, however reports significant weakness and fear. Will benefit from physical therapy for strengthening from deconditiong from recent hospitalization, referral sent  Depression with anxiety increased anxiety and fear with recent hospitalization. Will address furhter at follow up, she needs and will benefit from therapy , has had a few sessions in the past  Insomnia Controlled, no change in medication   Essential hypertension Controlled, no change in medication   Physical deconditioning Generalized weakness and physical deconditioning in patient with chronic anxiety and depression recently hospitalized and immobile for most of her 5 day hospital stay. Will benefit from physical therapy and she agrees to this  Allergic rhinitis Uncontrolled with nocturnal cough / in daytime with supine position, add daily flonase  Vitamin D deficiency Pt to start weekly vit D which she needs  Acute cystitis with hematuria Mildly symptomatic, and afebrile, will treat when c/s is available if needed

## 2015-05-09 ENCOUNTER — Ambulatory Visit (HOSPITAL_COMMUNITY): Admit: 2015-05-09 | Payer: Medicare Other

## 2015-05-10 ENCOUNTER — Other Ambulatory Visit: Payer: Self-pay | Admitting: Family Medicine

## 2015-05-10 LAB — URINE CULTURE: Colony Count: 45000

## 2015-05-10 MED ORDER — CIPROFLOXACIN HCL 500 MG PO TABS: 500.0000 mg | ORAL_TABLET | Freq: Two times a day (BID) | ORAL | Status: DC

## 2015-05-12 DIAGNOSIS — Z09 Encounter for follow-up examination after completed treatment for conditions other than malignant neoplasm: Secondary | ICD-10-CM | POA: Insufficient documentation

## 2015-05-12 DIAGNOSIS — R5381 Other malaise: Secondary | ICD-10-CM | POA: Insufficient documentation

## 2015-05-12 DIAGNOSIS — N3001 Acute cystitis with hematuria: Secondary | ICD-10-CM | POA: Insufficient documentation

## 2015-05-12 NOTE — Assessment & Plan Note (Signed)
Denies abdominal pain, bloating o, nausea or emesis. Fair appetite and BM normal, however reports significant weakness and fear. Will benefit from physical therapy for strengthening from deconditiong from recent hospitalization, referral sent

## 2015-05-12 NOTE — Assessment & Plan Note (Signed)
Uncontrolled with nocturnal cough / in daytime with supine position, add daily flonase

## 2015-05-12 NOTE — Assessment & Plan Note (Signed)
increased anxiety and fear with recent hospitalization. Will address furhter at follow up, she needs and will benefit from therapy , has had a few sessions in the past

## 2015-05-12 NOTE — Assessment & Plan Note (Signed)
Pt to start weekly vit D which she needs

## 2015-05-12 NOTE — Assessment & Plan Note (Signed)
Controlled, no change in medication  

## 2015-05-12 NOTE — Assessment & Plan Note (Signed)
Generalized weakness and physical deconditioning in patient with chronic anxiety and depression recently hospitalized and immobile for most of her 5 day hospital stay. Will benefit from physical therapy and she agrees to this

## 2015-05-12 NOTE — Assessment & Plan Note (Signed)
Mildly symptomatic, and afebrile, will treat when c/s is available if needed

## 2015-05-23 ENCOUNTER — Other Ambulatory Visit: Payer: Self-pay | Admitting: Family Medicine

## 2015-05-23 ENCOUNTER — Telehealth: Payer: Self-pay | Admitting: Family Medicine

## 2015-05-23 DIAGNOSIS — I10 Essential (primary) hypertension: Secondary | ICD-10-CM

## 2015-05-23 DIAGNOSIS — R531 Weakness: Secondary | ICD-10-CM

## 2015-05-23 NOTE — Telephone Encounter (Signed)
Patient is calling stating that she  is still very weak since she has came home from the hospital and she cant seem to get her strength back and she is asking if Dr. Moshe Cipro can write her something that would help her get her strength back, please advise?

## 2015-05-23 NOTE — Telephone Encounter (Signed)
Does she need any follow up labs for the weakness?

## 2015-05-23 NOTE — Telephone Encounter (Signed)
pls order cbc and diff, cmp, and TSH, let her know need this, also rept urine c/s

## 2015-05-24 NOTE — Addendum Note (Signed)
Addended by: Eual Fines on: 05/24/2015 09:37 AM   Modules accepted: Orders

## 2015-05-24 NOTE — Telephone Encounter (Signed)
Patient aware.

## 2015-06-10 ENCOUNTER — Ambulatory Visit (HOSPITAL_COMMUNITY)
Admission: RE | Admit: 2015-06-10 | Discharge: 2015-06-10 | Disposition: A | Discharge status: Home / Self Care | Payer: Medicare Other | Source: Ambulatory Visit | Attending: Family Medicine | Admitting: Family Medicine

## 2015-06-10 DIAGNOSIS — Z1231 Encounter for screening mammogram for malignant neoplasm of breast: Secondary | ICD-10-CM | POA: Diagnosis not present

## 2015-06-25 ENCOUNTER — Other Ambulatory Visit: Payer: Self-pay

## 2015-06-25 ENCOUNTER — Telehealth: Payer: Self-pay | Admitting: Family Medicine

## 2015-06-25 MED ORDER — TEMAZEPAM 30 MG PO CAPS: 30.0000 mg | ORAL_CAPSULE | Freq: Every evening | ORAL | Status: DC | PRN

## 2015-06-25 NOTE — Telephone Encounter (Signed)
[facility]

## 2015-06-25 NOTE — Telephone Encounter (Signed)
Patient is needing a medication refill on temazepam (RESTORIL) 30 MG capsule please advise?

## 2015-06-26 ENCOUNTER — Ambulatory Visit: Payer: Self-pay | Admitting: Family Medicine

## 2015-07-01 ENCOUNTER — Encounter: Payer: Self-pay | Admitting: Family Medicine

## 2015-07-22 ENCOUNTER — Other Ambulatory Visit: Payer: Self-pay | Admitting: Family Medicine

## 2015-07-24 ENCOUNTER — Ambulatory Visit (INDEPENDENT_AMBULATORY_CARE_PROVIDER_SITE_OTHER): Payer: Medicare Other | Admitting: Family Medicine

## 2015-07-24 ENCOUNTER — Encounter: Payer: Self-pay | Admitting: Family Medicine

## 2015-07-24 VITALS — BP 118/70 | HR 87 | Resp 18 | Ht 67.0 in | Wt 194.0 lb

## 2015-07-24 DIAGNOSIS — I10 Essential (primary) hypertension: Secondary | ICD-10-CM | POA: Diagnosis not present

## 2015-07-24 DIAGNOSIS — Z0000 Encounter for general adult medical examination without abnormal findings: Secondary | ICD-10-CM | POA: Insufficient documentation

## 2015-07-24 DIAGNOSIS — E559 Vitamin D deficiency, unspecified: Secondary | ICD-10-CM | POA: Diagnosis not present

## 2015-07-24 DIAGNOSIS — E785 Hyperlipidemia, unspecified: Secondary | ICD-10-CM

## 2015-07-24 NOTE — Patient Instructions (Signed)
Annual exam in 4 month, call if you need me sooner  Continue regular exercise and start volunteer activity by end June PLEASE  Please start daily word search puzzles Excellent memery test today, pls start word search puzzles  Fasting labs 2nd week in 4 months at Hospital  Thank you  for choosing [facility]. We consider it a privelige to serve you.  Delivering excellent health care in a caring and  compassionate way is our goal.  Partnering with you,  so that together we can achieve this goal is our strategy.

## 2015-07-24 NOTE — Progress Notes (Signed)
Subjective:    Patient ID: Theresa Garcia, female    DOB: 1940/03/27, 75 y.o.   MRN: [id number]  HPI Preventive Screening-Counseling & Management   Patient present here today for a Medicare annual wellness visit.   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 No  Over the past two weeks, have you felt little interest or pleasure in doing things? No  Have you lost interest or pleasure in daily life? No  Do you often feel hopeless? No  Do you cry easily over simple problems? No   Activities of Daily Living  In your present state of health, do you have any difficulty performing the following activities?  Driving?: No Managing money?: No Feeding yourself?:No Getting from bed to chair?:No Climbing a flight of stairs?:No Preparing food and eating?:No Bathing or showering?:No Getting dressed?:No Getting to the toilet?:No Using the toilet?:No Moving around from place to place?: No  Fall Risk Assessment In the past year have you fallen or had a near fall?:No Are you currently taking any medications that make you dizziness?:No   Hearing Difficulties: No Do you often ask people to speak up or repeat themselves?:No Do you experience ringing or noises in your ears?:No Do you have difficulty understanding soft or whispered voices?:No  Cognitive Testing  Alert? Yes Normal Appearance?Yes  Oriented to person?  Yes Place? Yes  Time? Yes  Displays appropriate judgment?Yes  Can read the correct time from a watch face? yes Are you having problems remembering things?No age appropriate ( sticks to same patterns)   Advanced Directives have been discussed with the patient?Yes and brochure provided   List the Names of Other Physician/Practitioners you currently use: care teams up to date    Indicate any recent Medical Services you may have received from other than [facility] providers in the past year (date may be approximate).   Assessment:    Annual Wellness Exam   Plan:    .  Medicare Attestation  I have personally reviewed:  The patient's medical and social history  Their use of alcohol, tobacco or illicit drugs  Their current medications and supplements  The patient's functional ability including ADLs,fall risks, home safety risks, cognitive, and hearing and visual impairment  Diet and physical activities  Evidence for depression or mood disorders  The patient's weight, height, BMI, and visual acuity have been recorded in the chart. I have made referrals, counseling, and provided education to the patient based on review of the above and I have provided the patient with a written personalized care plan for preventive services.      Review of Systems     Objective:   Physical Exam  BP 118/70 mmHg  Pulse 87  Resp 18  Ht 5\' 7"  (1.702 m)  Wt 194 lb (87.998 kg)  BMI 30.38 kg/m2  SpO2 97%       Assessment & Plan:  Medicare  annual wellness visit, subsequent Annual exam as documented. Counseling done  re healthy lifestyle involving commitment to 150 minutes exercise per week, heart healthy diet, and attaining healthy weight.The importance of adequate sleep also discussed. Regular seat belt use and home safety, is also discussed. Changes in health habits are decided on by the patient with goals and time frames  set for achieving them. Immunization and cancer screening needs are  specifically addressed at this visit.

## 2015-07-24 NOTE — Assessment & Plan Note (Signed)

## 2015-09-09 ENCOUNTER — Other Ambulatory Visit: Payer: Self-pay | Admitting: Family Medicine

## 2015-09-16 ENCOUNTER — Other Ambulatory Visit: Payer: Self-pay

## 2015-09-16 MED ORDER — TEMAZEPAM 30 MG PO CAPS: 30.0000 mg | ORAL_CAPSULE | Freq: Every evening | ORAL | 1 refills | Status: DC | PRN

## 2015-09-30 ENCOUNTER — Other Ambulatory Visit: Payer: Self-pay

## 2015-09-30 MED ORDER — TEMAZEPAM 30 MG PO CAPS: 30.0000 mg | ORAL_CAPSULE | Freq: Every evening | ORAL | 1 refills | Status: DC | PRN

## 2015-10-09 ENCOUNTER — Other Ambulatory Visit: Payer: Self-pay | Admitting: Family Medicine

## 2015-10-31 DIAGNOSIS — H524 Presbyopia: Secondary | ICD-10-CM | POA: Diagnosis not present

## 2015-10-31 DIAGNOSIS — H52223 Regular astigmatism, bilateral: Secondary | ICD-10-CM | POA: Diagnosis not present

## 2015-10-31 DIAGNOSIS — H25813 Combined forms of age-related cataract, bilateral: Secondary | ICD-10-CM | POA: Diagnosis not present

## 2015-10-31 DIAGNOSIS — H5203 Hypermetropia, bilateral: Secondary | ICD-10-CM | POA: Diagnosis not present

## 2015-12-25 ENCOUNTER — Other Ambulatory Visit: Payer: Self-pay | Admitting: Family Medicine

## 2015-12-30 ENCOUNTER — Ambulatory Visit (INDEPENDENT_AMBULATORY_CARE_PROVIDER_SITE_OTHER): Payer: Medicare Other | Admitting: Family Medicine

## 2015-12-30 ENCOUNTER — Encounter: Payer: Self-pay | Admitting: Family Medicine

## 2015-12-30 VITALS — BP 126/74 | HR 90 | Resp 18 | Ht 67.0 in | Wt 194.1 lb

## 2015-12-30 DIAGNOSIS — E559 Vitamin D deficiency, unspecified: Secondary | ICD-10-CM | POA: Diagnosis not present

## 2015-12-30 DIAGNOSIS — R42 Dizziness and giddiness: Secondary | ICD-10-CM

## 2015-12-30 DIAGNOSIS — H8110 Benign paroxysmal vertigo, unspecified ear: Secondary | ICD-10-CM | POA: Diagnosis not present

## 2015-12-30 DIAGNOSIS — Z23 Encounter for immunization: Secondary | ICD-10-CM

## 2015-12-30 DIAGNOSIS — F329 Major depressive disorder, single episode, unspecified: Secondary | ICD-10-CM

## 2015-12-30 DIAGNOSIS — E782 Mixed hyperlipidemia: Secondary | ICD-10-CM

## 2015-12-30 DIAGNOSIS — N3001 Acute cystitis with hematuria: Secondary | ICD-10-CM

## 2015-12-30 DIAGNOSIS — Z1211 Encounter for screening for malignant neoplasm of colon: Secondary | ICD-10-CM

## 2015-12-30 DIAGNOSIS — I10 Essential (primary) hypertension: Secondary | ICD-10-CM

## 2015-12-30 LAB — POCT URINALYSIS DIPSTICK
Bilirubin, UA: NEGATIVE
Glucose, UA: NEGATIVE
Ketones, UA: NEGATIVE
Leukocytes, UA: NEGATIVE
Nitrite, UA: NEGATIVE
Protein, UA: NEGATIVE
Spec Grav, UA: 1.02
Urobilinogen, UA: 0.2
pH, UA: 6

## 2015-12-30 LAB — HEMOCCULT GUIAC POC 1CARD (OFFICE): Fecal Occult Blood, POC: NEGATIVE

## 2015-12-30 MED ORDER — FLUOXETINE HCL 20 MG PO TABS: 20.0000 mg | ORAL_TABLET | Freq: Every day | ORAL | 3 refills | Status: DC

## 2015-12-30 MED ORDER — PREDNISONE 5 MG PO TABS: 5.0000 mg | ORAL_TABLET | Freq: Two times a day (BID) | ORAL | 0 refills | Status: AC

## 2015-12-30 NOTE — Patient Instructions (Addendum)
F/u in 6 weeks, call if you need me before Flu vaccine today  Exam today is good including rectal exam  Urine to be checked for infection  Labs today, cBC, cmp, lipid, TSH and vit D  New medication for vertigo 5 days of prednisone  New for depression/ lack of energy and interest ion doing ,much, fluoxetine daily, you have had this before

## 2015-12-31 LAB — CBC
HCT: 44.4 % (ref 35.0–45.0)
Hemoglobin: 14.7 g/dL (ref 11.7–15.5)
MCH: 31.6 pg (ref 27.0–33.0)
MCHC: 33.1 g/dL (ref 32.0–36.0)
MCV: 95.5 fL (ref 80.0–100.0)
MPV: 9.8 fL (ref 7.5–12.5)
Platelets: 247 10*3/uL (ref 140–400)
RBC: 4.65 MIL/uL (ref 3.80–5.10)
RDW: 14.9 % (ref 11.0–15.0)
WBC: 12 10*3/uL — ABNORMAL HIGH (ref 3.8–10.8)

## 2015-12-31 LAB — LIPID PANEL
Cholesterol: 183 mg/dL (ref <200)
HDL: 68 mg/dL (ref >50)
LDL Cholesterol: 85 mg/dL
Total CHOL/HDL Ratio: 2.7 Ratio (ref <5.0)
Triglycerides: 148 mg/dL (ref <150)
VLDL: 30 mg/dL (ref <30)

## 2015-12-31 LAB — COMPREHENSIVE METABOLIC PANEL
ALT: 18 U/L (ref 6–29)
AST: 25 U/L (ref 10–35)
Albumin: 4.6 g/dL (ref 3.6–5.1)
Alkaline Phosphatase: 54 U/L (ref 33–130)
BUN: 16 mg/dL (ref 7–25)
CO2: 22 mmol/L (ref 20–31)
Calcium: 10.5 mg/dL — ABNORMAL HIGH (ref 8.6–10.4)
Chloride: 106 mmol/L (ref 98–110)
Creat: 1.13 mg/dL — ABNORMAL HIGH (ref 0.60–0.93)
Glucose, Bld: 71 mg/dL (ref 65–99)
Potassium: 4.6 mmol/L (ref 3.5–5.3)
Sodium: 140 mmol/L (ref 135–146)
Total Bilirubin: 0.5 mg/dL (ref 0.2–1.2)
Total Protein: 7.5 g/dL (ref 6.1–8.1)

## 2015-12-31 LAB — VITAMIN D 25 HYDROXY (VIT D DEFICIENCY, FRACTURES): Vit D, 25-Hydroxy: 16 ng/mL — ABNORMAL LOW (ref 30–100)

## 2015-12-31 LAB — TSH: TSH: 0.92 mIU/L

## 2016-01-01 ENCOUNTER — Other Ambulatory Visit: Payer: Self-pay

## 2016-01-01 MED ORDER — FLUOXETINE HCL 20 MG PO CAPS: 20.0000 mg | ORAL_CAPSULE | Freq: Every day | ORAL | 3 refills | Status: DC

## 2016-01-05 DIAGNOSIS — Z1211 Encounter for screening for malignant neoplasm of colon: Secondary | ICD-10-CM | POA: Insufficient documentation

## 2016-01-05 NOTE — Assessment & Plan Note (Signed)
Untreated, not suicidal or homicidal Start fluoxetine, has benefited in the past from this

## 2016-01-05 NOTE — Assessment & Plan Note (Signed)
Hyperlipidemia:Low fat diet discussed and encouraged.   Lipid Panel  Lab Results  Component Value Date   CHOL 183 12/30/2015   HDL 68 12/30/2015   LDLCALC 85 12/30/2015   TRIG 148 12/30/2015   CHOLHDL 2.7 12/30/2015   Controlled, no change in medication

## 2016-01-05 NOTE — Progress Notes (Signed)
   Theresa Garcia     MRN: [id number]      DOB: September 28, 1940   HPI Theresa Garcia is here for follow up and re-evaluation of chronic medical conditions, medication management and review of any available recent lab and radiology data.  Preventive health is updated, specifically  Cancer screening and Immunization.   2 week h/o dizziness, light headedness and "not feeling good. No change in behavior, sits at home all day,limited interaction with the public, and  Has [pictures of deceased family members all over  ROS Denies recent fever or chills. Denies sinus pressure, nasal congestion, ear pain or sore throat. Denies chest congestion, productive cough or wheezing. Denies chest pains, palpitations and leg swelling Denies abdominal pain, nausea, vomiting,diarrhea or constipation.   C/o malodorous urine Denies joint pain, swelling and limitation in mobility. Denies headaches, seizures, numbness, or tingling. Denies depression, anxiety or insomnia. Denies skin break down or rash.   PE  BP 126/74   Pulse 90   Resp 18   Ht 5\' 7"  (1.702 m)   Wt 194 lb 1.9 oz (88.1 kg)   SpO2 96%   BMI 30.40 kg/m   Patient alert and oriented and in no cardiopulmonary distress.  HEENT: No facial asymmetry, EOMI,   oropharynx pink and moist.  Neck supple no JVD, no mass.  Chest: Clear to auscultation bilaterally.  CVS: S1, S2 no murmurs, no S3.Regular rate.  ABD: Soft non tender.  Rectal: no mass, heme negative stool Ext: No edema  MS: Adequate ROM spine, shoulders, hips and knees.  Skin: Intact, no ulcerations or rash noted.  Psych: Good eye contact, flat affect. Memory intact not anxious  depressed appearing.  CNS: CN 2-12 intact, power,  normal throughout.no focal deficits noted.   Assessment & Plan  Depression Untreated, not suicidal or homicidal Start fluoxetine, has benefited in the past from this  Essential hypertension Controlled, no change in medication DASH diet and commitment  to daily physical activity for a minimum of 30 minutes discussed and encouraged, as a part of hypertension management. The importance of attaining a healthy weight is also discussed.  BP/Weight 12/30/2015 07/24/2015 05/07/2015 04/30/2015 04/26/2015 04/24/2015 [id number]  Systolic BP 123XX123 123456 [id number] A999333 - 123XX123 A999333  Diastolic BP 74 70 84 66 - 82 74  Wt. (Lbs) 194.12 194 192.12 - 196.8 196 196.12  BMI 30.4 30.38 30.08 - 30.82 30.69 30.71       Hyperlipidemia Hyperlipidemia:Low fat diet discussed and encouraged.   Lipid Panel  Lab Results  Component Value Date   CHOL 183 12/30/2015   HDL 68 12/30/2015   LDLCALC 85 12/30/2015   TRIG 148 12/30/2015   CHOLHDL 2.7 12/30/2015   Controlled, no change in medication     Vertigo Short course of prednisone, needs ENT if persists  Special screening for malignant neoplasms, colon Heme negative stool  Need for prophylactic vaccination and inoculation against influenza After obtaining informed consent, the vaccine is  administered by LPN.   Acute cystitis with hematuria Abnormal UA with minimal symptoms , will follow c/s

## 2016-01-05 NOTE — Assessment & Plan Note (Signed)
Short course of prednisone, needs ENT if persists

## 2016-01-05 NOTE — Assessment & Plan Note (Signed)
After obtaining informed consent, the vaccine is  administered by LPN.  

## 2016-01-05 NOTE — Assessment & Plan Note (Signed)
Abnormal UA with minimal symptoms , will follow c/s

## 2016-01-05 NOTE — Assessment & Plan Note (Signed)
Controlled, no change in medication DASH diet and commitment to daily physical activity for a minimum of 30 minutes discussed and encouraged, as a part of hypertension management. The importance of attaining a healthy weight is also discussed.  BP/Weight 12/30/2015 07/24/2015 05/07/2015 04/30/2015 04/26/2015 04/24/2015 [id number]  Systolic BP 123XX123 123456 [id number] A999333 - 123XX123 A999333  Diastolic BP 74 70 84 66 - 82 74  Wt. (Lbs) 194.12 194 192.12 - 196.8 196 196.12  BMI 30.4 30.38 30.08 - 30.82 30.69 30.71

## 2016-01-05 NOTE — Assessment & Plan Note (Signed)
Heme negative stool 

## 2016-01-17 ENCOUNTER — Telehealth: Payer: Self-pay

## 2016-01-17 ENCOUNTER — Other Ambulatory Visit: Payer: Self-pay | Admitting: Family Medicine

## 2016-01-19 ENCOUNTER — Other Ambulatory Visit: Payer: Self-pay | Admitting: Family Medicine

## 2016-01-19 NOTE — Telephone Encounter (Signed)
Recommend reduce dose to 10 mg, if prescribed tab can be split in half may do so, if not I am entering the 10 mg dose

## 2016-01-20 ENCOUNTER — Other Ambulatory Visit: Payer: Self-pay

## 2016-01-20 MED ORDER — FLUOXETINE HCL 10 MG PO TABS: 10.0000 mg | ORAL_TABLET | Freq: Every day | ORAL | 3 refills | Status: DC

## 2016-01-20 NOTE — Telephone Encounter (Signed)
Called and spoke with sister.  She is aware and will give information to patient.   New rx sent to pharmacy.

## 2016-02-16 ENCOUNTER — Other Ambulatory Visit: Payer: Self-pay | Admitting: Family Medicine

## 2016-03-18 ENCOUNTER — Other Ambulatory Visit: Payer: Self-pay

## 2016-03-18 MED ORDER — TEMAZEPAM 30 MG PO CAPS: 30.0000 mg | ORAL_CAPSULE | Freq: Every evening | ORAL | 0 refills | Status: DC | PRN

## 2016-04-01 ENCOUNTER — Telehealth: Payer: Self-pay | Admitting: Family Medicine

## 2016-04-01 IMAGING — DX DG CHEST 2V
2 series · 2 of 2 positions shown · non-contrast
Comparison: 01/24/2013

CLINICAL DATA: Productive cough and cold for 6 weeks. Personal
history of breast carcinoma.

EXAM:
CHEST  2 VIEW

[chest pa]
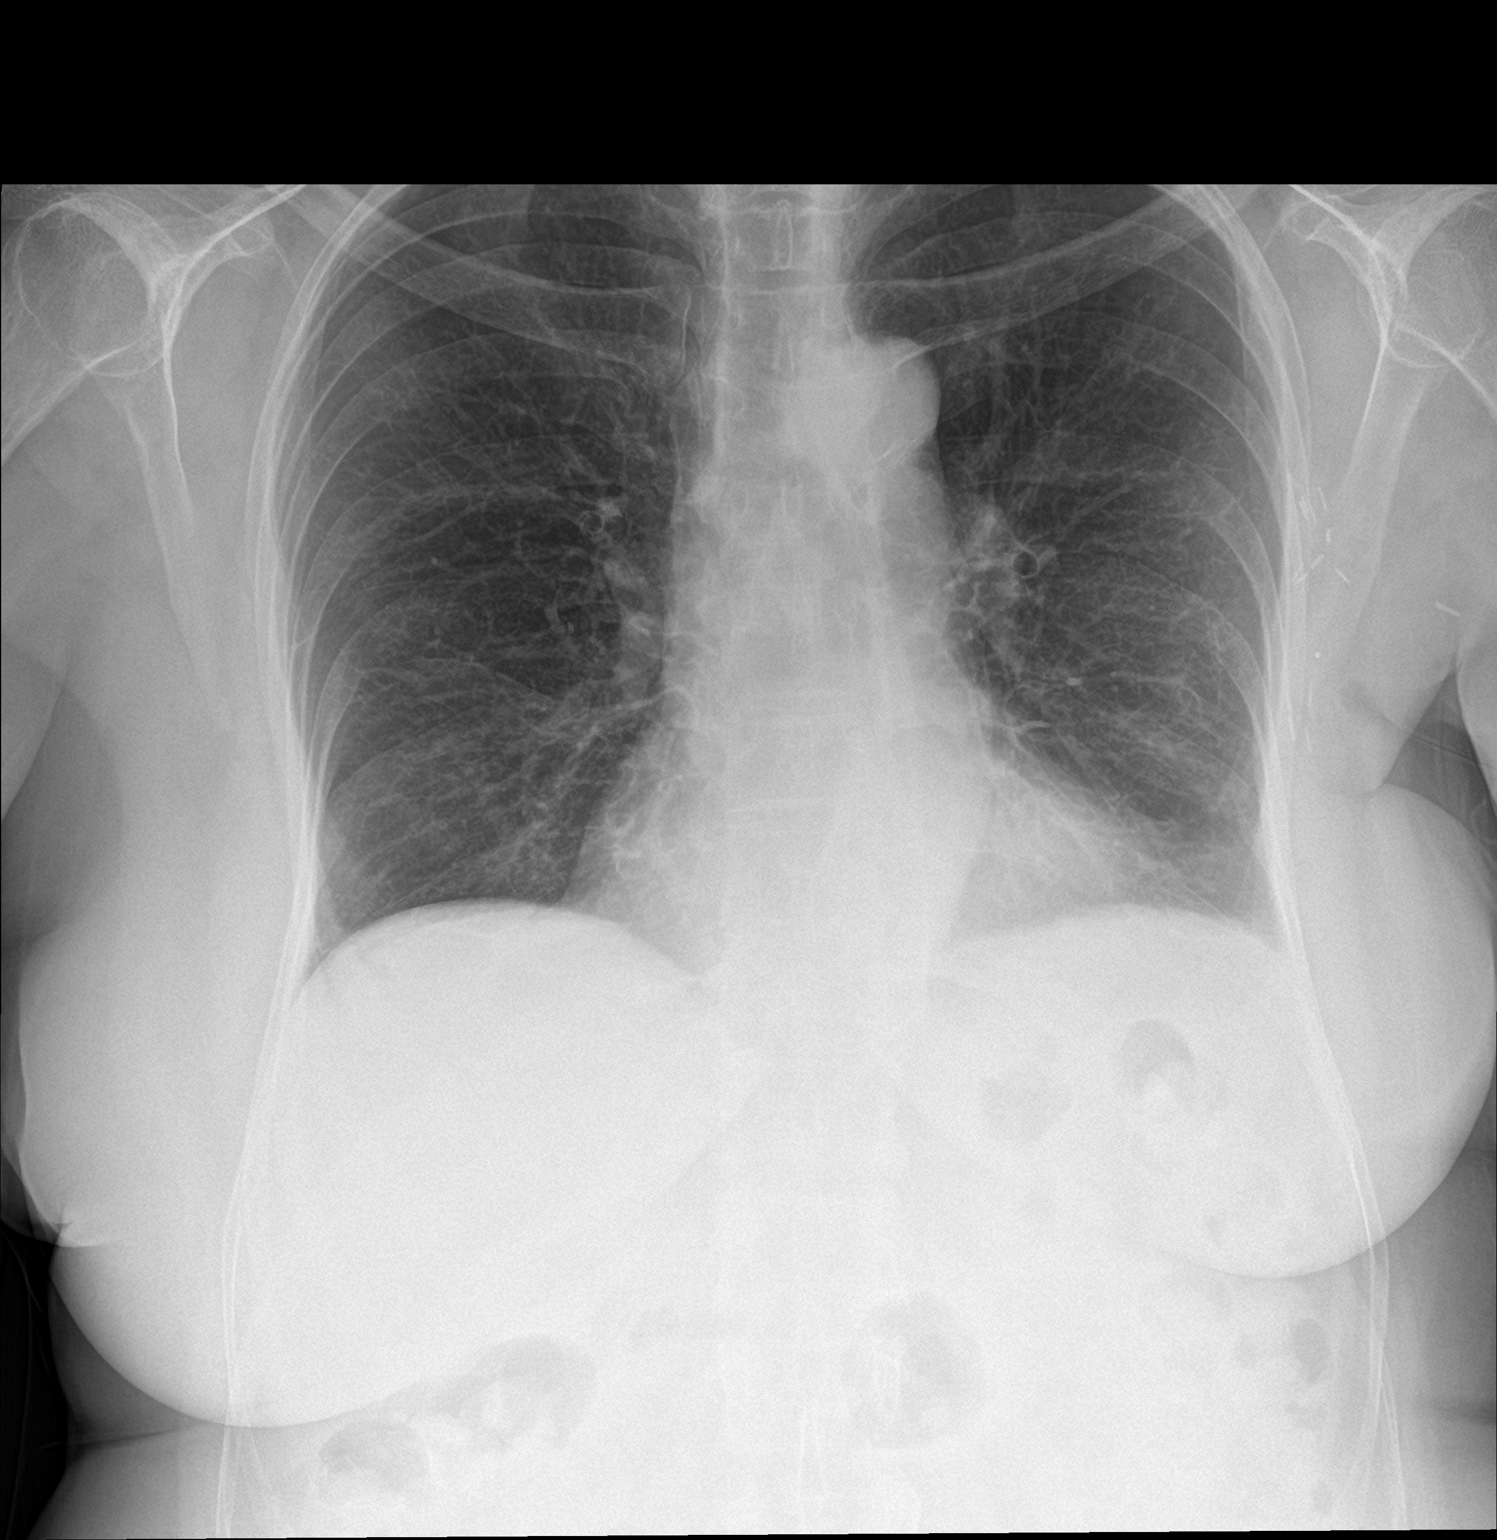

[chest lat]
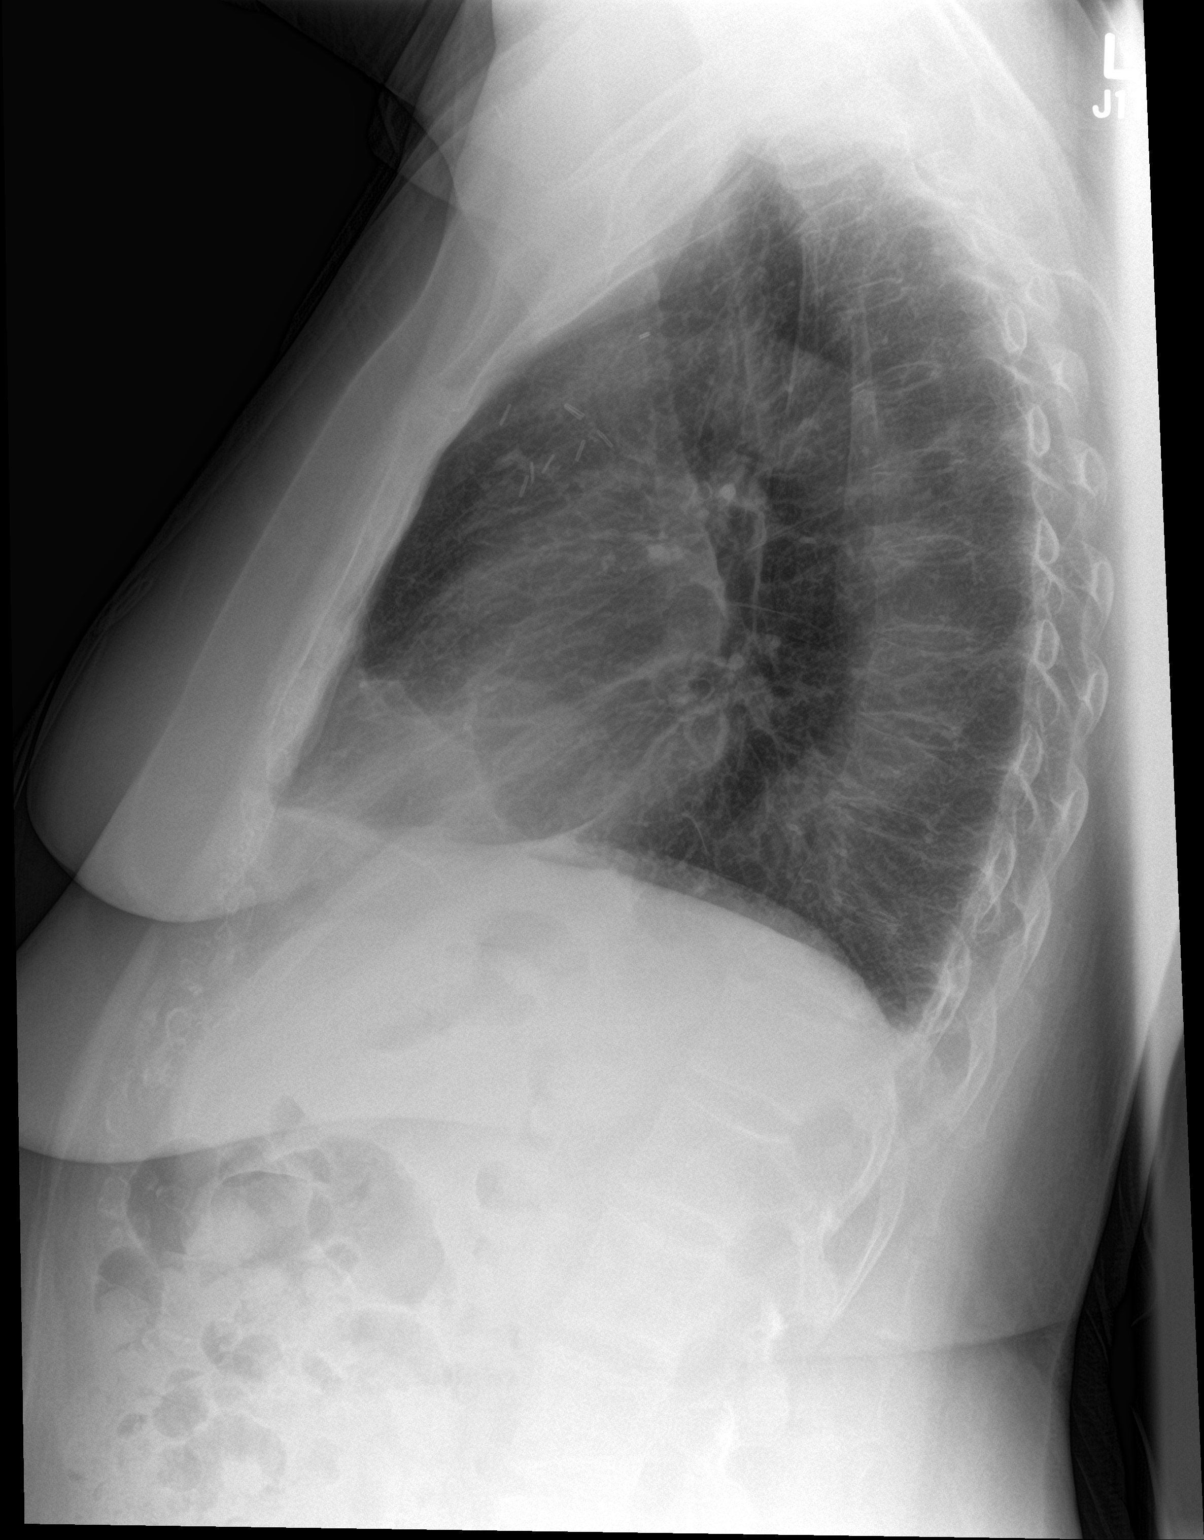

[2 of 2 positions shown; findings below may reference images not displayed]

FINDINGS: The heart size and mediastinal contours are within normal limits.
Both lungs are clear. No evidence of pneumothorax or pleural
effusion. Surgical clips again seen in left axilla.
IMPRESSION: No active cardiopulmonary disease.

## 2016-04-01 NOTE — Telephone Encounter (Signed)
Patient aware that rx recently refilled on 1/24.  She will contact pharmacy.

## 2016-04-01 NOTE — Telephone Encounter (Signed)
Theresa Garcia is asking for a refill on temazepam (RESTORIL) 30 MG capsule sent to [facility] in [place], please advise?

## 2016-04-02 ENCOUNTER — Ambulatory Visit (INDEPENDENT_AMBULATORY_CARE_PROVIDER_SITE_OTHER): Payer: Medicare HMO | Admitting: Family Medicine

## 2016-04-02 ENCOUNTER — Encounter: Payer: Self-pay | Admitting: Family Medicine

## 2016-04-02 VITALS — BP 112/80 | HR 75 | Resp 16 | Ht 67.0 in | Wt 194.0 lb

## 2016-04-02 DIAGNOSIS — I10 Essential (primary) hypertension: Secondary | ICD-10-CM | POA: Diagnosis not present

## 2016-04-02 DIAGNOSIS — Z853 Personal history of malignant neoplasm of breast: Secondary | ICD-10-CM | POA: Diagnosis not present

## 2016-04-02 DIAGNOSIS — R2232 Localized swelling, mass and lump, left upper limb: Secondary | ICD-10-CM | POA: Diagnosis not present

## 2016-04-02 NOTE — Patient Instructions (Signed)
F/u in 4 month, call if you need me sooner  You are referred for urgent imaging of left breast and left armpit, we will call you with appointment information as soon as we have it  Thank you  for choosing [facility]. We consider it a privelige to serve you.  Delivering excellent health care in a caring and  compassionate way is our goal.  Partnering with you,  so that together we can achieve this goal is our strategy.

## 2016-04-03 ENCOUNTER — Encounter: Payer: Self-pay | Admitting: Family Medicine

## 2016-04-03 DIAGNOSIS — R2232 Localized swelling, mass and lump, left upper limb: Secondary | ICD-10-CM | POA: Insufficient documentation

## 2016-04-03 NOTE — Assessment & Plan Note (Signed)
Left with cure, pt states first presented with axillary mass, urgent  Imaging of new lump in left axilla

## 2016-04-03 NOTE — Assessment & Plan Note (Signed)
Controlled, no change in medication  

## 2016-04-03 NOTE — Progress Notes (Signed)
   PATT ANCAR     MRN: [id number]      DOB: [birth year]   HPI Theresa Garcia is here c/o lump under left armpit, non tender, present for past 1 month. Has had left breast cancer and states that this is how it presented. Denies left breast lump or right breast lumps or nipple d/c ROS See HPI  Denies recent fever or chills. Denies sinus pressure, nasal congestion, ear pain or sore throat. Denies chest congestion, productive cough or wheezing. Denies chest pains, palpitations and leg swelling  Denies joint pain, swelling and limitation in mobility. Denies headaches, seizures, numbness, or tingling. Denies uncontrolled depression, anxiety or insomnia. Denies skin break down or rash.   PE  BP 112/80   Pulse 75   Resp 16   Ht 5\' 7"  (1.702 m)   Wt 194 lb (88 kg)   SpO2 96%   BMI 30.38 kg/m   Patient alert and oriented and in no cardiopulmonary distress.  HEENT: No facial asymmetry, EOMI,   oropharynx pink and moist.  Neck supple Chest: Clear to auscultation bilaterally.  CVS: S1, S2 no murmurs, no S3.Regular rate. Breast: no palpable mass in either breast, no nipple d/c or inversion. Right breast: no axillary or  supraclavicular mass Left breast: non tender, firm mass, diameter approx 2.5 cm, immobile in axilla around 7 o'clock, inferior aspect, no supraclavicular adenopathy Ext: No edema  MS: Adequate ROM spine, shoulders, hips and knees.  Skin: Intact, no ulcerations or rash noted.  Psych: Good eye contact, flat  affect. Memory intact not anxious or depressed appearing.  CNS: CN 2-12 intact, power,  normal throughout.no focal deficits noted.   Assessment & Plan  History of breast cancer in female Left with cure, pt states first presented with axillary mass, urgent  Imaging of new lump in left axilla  Essential hypertension Controlled, no change in medication   Axillary mass, left 1 month h/o non tender mass in left ar,mpit, reportedly similar to presentation  of breast cancer in left breast over 5 years ago, urgent imaging indicated, possible  mass , approx 2.5 cm non tender

## 2016-04-03 NOTE — Assessment & Plan Note (Signed)
1 month h/o non tender mass in left ar,mpit, reportedly similar to presentation of breast cancer in left breast over 5 years ago, urgent imaging indicated, possible  mass , approx 2.5 cm non tender

## 2016-04-04 IMAGING — CR DG ABDOMEN 1V
1 series · 1 of 1 positions shown · non-contrast
Comparison: None.

CLINICAL DATA: Confirm nasogastric tube placement.

EXAM:
ABDOMEN - 1 VIEW

[supine ap]
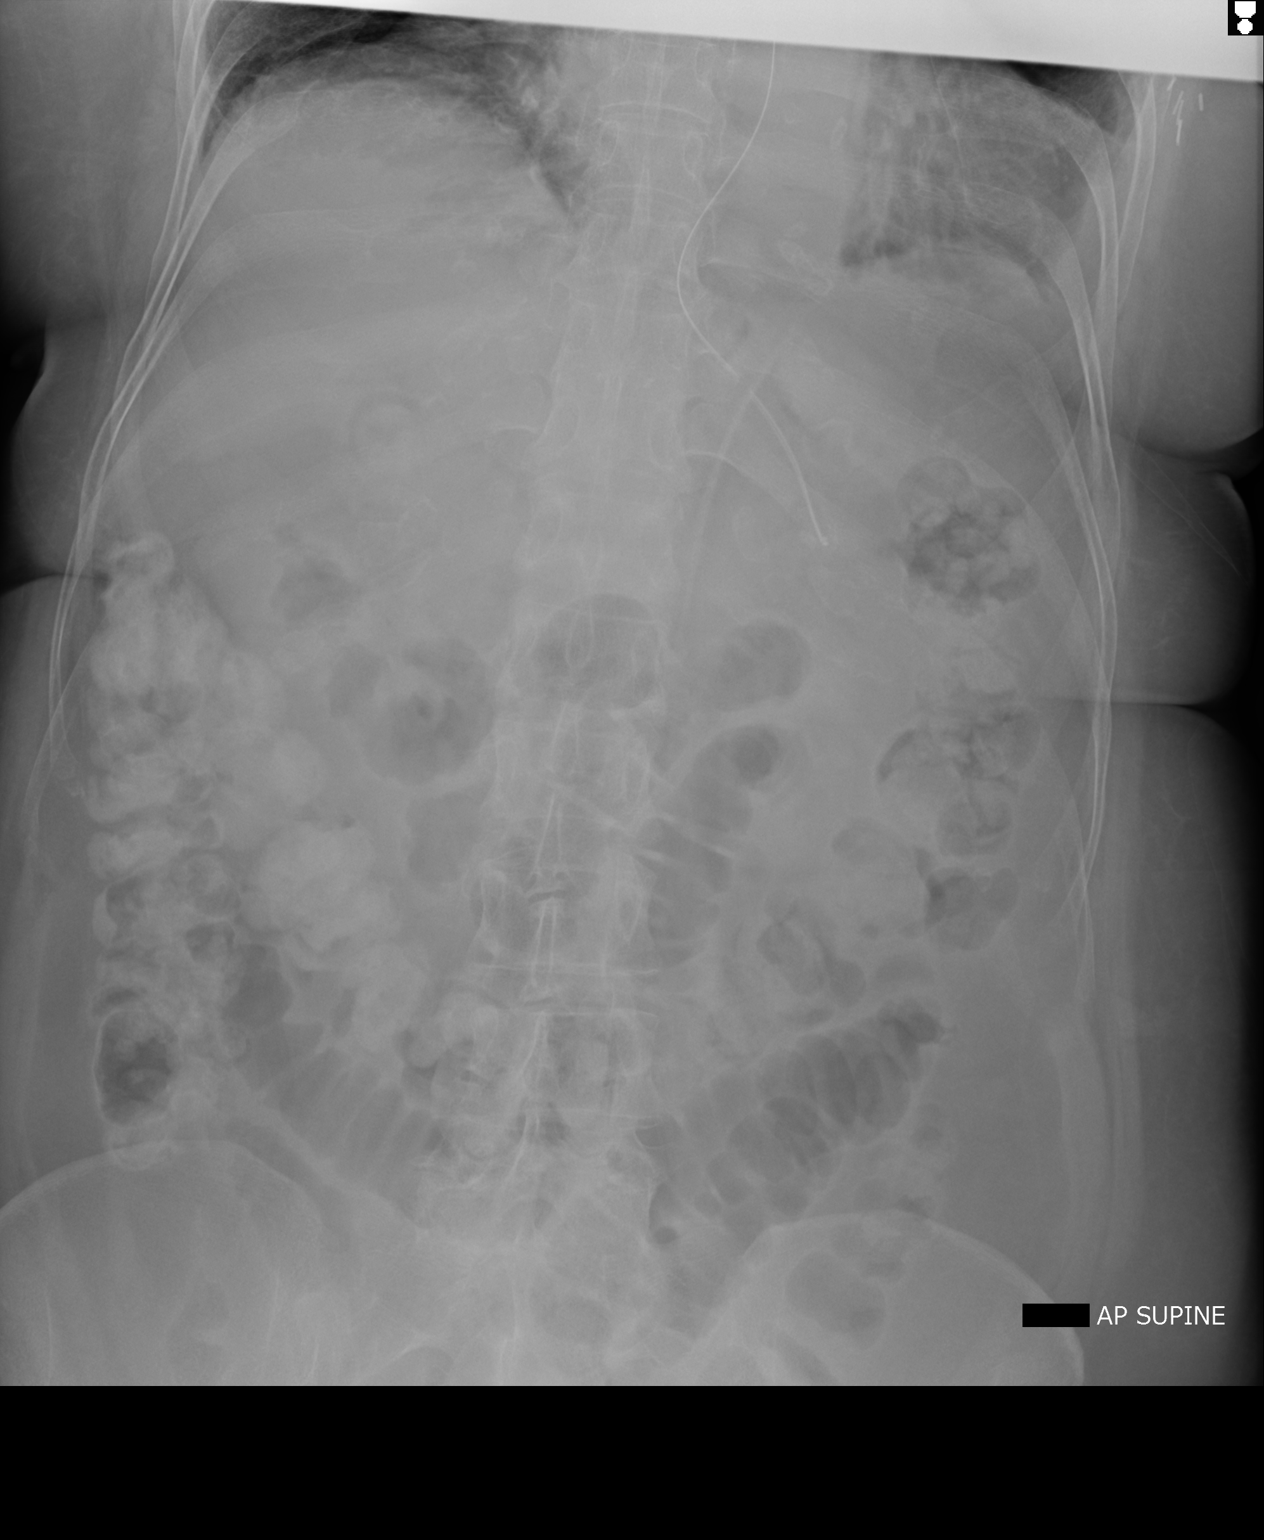

[1 of 1 positions shown; findings below may reference images not displayed]

FINDINGS: Nasogastric tube appears adequately positioned with tip in the
stomach body region. Proximal side holes of the nasogastric tube
located approximately 2.5 cm below the level of the GE junction.

Mildly prominent gas-filled small bowel loops are appreciated within
the midline abdomen and upper pelvis, compatible with the small
bowel dilatation described on recent CT of 04/26/2015. Oral contrast
material is present within the right colon indicating a partial
small bowel obstruction. No evidence of free intraperitoneal air. No
evidence of soft tissue mass or abnormal fluid collection.
IMPRESSION: 1. Nasogastric tube adequately positioned in the stomach, tip likely
in the upper gastric body region. Proximal side holes located
approximately 2.5 cm below the level of the GE junction. Could
advance 5-10 cm for more optimal radiographic positioning.
2. Small bowel dilatation, compatible with yesterday's CT report
description of partial small bowel obstruction.

## 2016-04-06 ENCOUNTER — Ambulatory Visit (HOSPITAL_COMMUNITY)
Admission: RE | Admit: 2016-04-06 | Discharge: 2016-04-06 | Disposition: A | Discharge status: Home / Self Care | Payer: Medicare HMO | Source: Ambulatory Visit | Attending: Family Medicine | Admitting: Family Medicine

## 2016-04-06 DIAGNOSIS — R2232 Localized swelling, mass and lump, left upper limb: Secondary | ICD-10-CM | POA: Diagnosis not present

## 2016-04-07 ENCOUNTER — Telehealth: Payer: Self-pay

## 2016-04-07 ENCOUNTER — Other Ambulatory Visit: Payer: Self-pay | Admitting: Family Medicine

## 2016-04-07 NOTE — Telephone Encounter (Signed)
I Directly informed pt of normal [place] , no mass

## 2016-04-08 NOTE — Telephone Encounter (Signed)
Pt aware of result.

## 2016-05-14 DIAGNOSIS — Z79899 Other long term (current) drug therapy: Secondary | ICD-10-CM | POA: Diagnosis not present

## 2016-05-14 DIAGNOSIS — G4700 Insomnia, unspecified: Secondary | ICD-10-CM | POA: Diagnosis not present

## 2016-05-14 DIAGNOSIS — Z6829 Body mass index (BMI) 29.0-29.9, adult: Secondary | ICD-10-CM | POA: Diagnosis not present

## 2016-05-14 DIAGNOSIS — E785 Hyperlipidemia, unspecified: Secondary | ICD-10-CM | POA: Diagnosis not present

## 2016-05-14 DIAGNOSIS — Z853 Personal history of malignant neoplasm of breast: Secondary | ICD-10-CM | POA: Diagnosis not present

## 2016-05-14 DIAGNOSIS — B009 Herpesviral infection, unspecified: Secondary | ICD-10-CM | POA: Diagnosis not present

## 2016-05-14 DIAGNOSIS — Z0000 Encounter for general adult medical examination without abnormal findings: Secondary | ICD-10-CM | POA: Diagnosis not present

## 2016-05-14 DIAGNOSIS — K08409 Partial loss of teeth, unspecified cause, unspecified class: Secondary | ICD-10-CM | POA: Diagnosis not present

## 2016-05-14 DIAGNOSIS — I499 Cardiac arrhythmia, unspecified: Secondary | ICD-10-CM | POA: Diagnosis not present

## 2016-05-14 DIAGNOSIS — I10 Essential (primary) hypertension: Secondary | ICD-10-CM | POA: Diagnosis not present

## 2016-05-20 ENCOUNTER — Telehealth: Payer: Self-pay | Admitting: Family Medicine

## 2016-05-20 NOTE — Telephone Encounter (Signed)
Patient's sister stopped in to make appt because patients home health nurse told her that her blood count was low, I scheduled her to see dr.nelson on Monday 05/25/16. If she needs to see dr.simpson just let me know and I will switch up her appt. thanks cb#: [phone]

## 2016-05-25 ENCOUNTER — Ambulatory Visit: Payer: Self-pay | Admitting: Family Medicine

## 2016-05-26 ENCOUNTER — Other Ambulatory Visit: Payer: Self-pay | Admitting: Family Medicine

## 2016-05-26 DIAGNOSIS — Z1231 Encounter for screening mammogram for malignant neoplasm of breast: Secondary | ICD-10-CM

## 2016-05-26 NOTE — Telephone Encounter (Signed)
Scheduled to see dr Moshe Cipro on 4/4

## 2016-05-27 ENCOUNTER — Encounter: Payer: Self-pay | Admitting: Family Medicine

## 2016-05-27 ENCOUNTER — Ambulatory Visit (INDEPENDENT_AMBULATORY_CARE_PROVIDER_SITE_OTHER): Payer: Medicare HMO | Admitting: Family Medicine

## 2016-05-27 VITALS — BP 120/82 | HR 88 | Temp 96.9°F | Resp 18 | Ht 67.0 in | Wt 193.0 lb

## 2016-05-27 DIAGNOSIS — Z1211 Encounter for screening for malignant neoplasm of colon: Secondary | ICD-10-CM | POA: Diagnosis not present

## 2016-05-27 DIAGNOSIS — I10 Essential (primary) hypertension: Secondary | ICD-10-CM | POA: Diagnosis not present

## 2016-05-27 DIAGNOSIS — F5101 Primary insomnia: Secondary | ICD-10-CM | POA: Diagnosis not present

## 2016-05-27 DIAGNOSIS — A6004 Herpesviral vulvovaginitis: Secondary | ICD-10-CM

## 2016-05-27 DIAGNOSIS — E784 Other hyperlipidemia: Secondary | ICD-10-CM

## 2016-05-27 DIAGNOSIS — Z780 Asymptomatic menopausal state: Secondary | ICD-10-CM

## 2016-05-27 DIAGNOSIS — E7849 Other hyperlipidemia: Secondary | ICD-10-CM

## 2016-05-27 DIAGNOSIS — R2681 Unsteadiness on feet: Secondary | ICD-10-CM

## 2016-05-27 DIAGNOSIS — R69 Illness, unspecified: Secondary | ICD-10-CM | POA: Diagnosis not present

## 2016-05-27 DIAGNOSIS — R531 Weakness: Secondary | ICD-10-CM

## 2016-05-27 NOTE — Patient Instructions (Addendum)
f/u as before  Blood pressure and exam ar good, no need to change medication  Please keeep mammogram appointment  You are referred for bone density test pls get this you need it.  You are referred to Dr Laural Golden for screening colonoscopy  We will have  In home pT assessment and help for 3 weeks  Thank you  for choosing [facility]. We consider it a privelige to serve you.  Delivering excellent health care in a caring and  compassionate way is our goal.  Partnering with you,  so that together we can achieve this goal is our strategy.

## 2016-05-31 ENCOUNTER — Encounter: Payer: Self-pay | Admitting: Family Medicine

## 2016-05-31 DIAGNOSIS — R5383 Other fatigue: Secondary | ICD-10-CM | POA: Insufficient documentation

## 2016-05-31 DIAGNOSIS — R2681 Unsteadiness on feet: Secondary | ICD-10-CM | POA: Insufficient documentation

## 2016-05-31 NOTE — Assessment & Plan Note (Signed)
Hyperlipidemia:Low fat diet discussed and encouraged.   Lipid Panel  Lab Results  Component Value Date   CHOL 183 12/30/2015   HDL 68 12/30/2015   LDLCALC 85 12/30/2015   TRIG 148 12/30/2015   CHOLHDL 2.7 12/30/2015  Updated lab needed at/ before next visit.

## 2016-05-31 NOTE — Assessment & Plan Note (Signed)
Chronic use of acyclovir keeps pt symptom free

## 2016-05-31 NOTE — Assessment & Plan Note (Signed)
Sleep hygiene reviewed and written information offered also. Prescription sent for  medication needed.  

## 2016-05-31 NOTE — Assessment & Plan Note (Signed)
c/o generalized weakness and unsteady gait , agreeable to in home PT , will refer for twice weekly visits for 3 weeks

## 2016-05-31 NOTE — Progress Notes (Signed)
   Theresa Garcia     MRN: [id number]      DOB: June 07, 1940   HPI Theresa Garcia is here for follow up and re-evaluation of chronic medical conditions, medication management and review of any available recent lab and radiology data.  Preventive health is updated, specifically  Cancer screening and Immunization.    The PT denies any adverse reactions to current medications since the last visit.  Accompanied by her younger brother, concern expressed by recent home nurse visit when blood pressure reported low Brother concerned about possible memory loss also Screening tests which are still outstanding she now states she will have done, dexa and colonoscopy on insistence of her brother c/o left axillary lump, just had negative imaging less than 2 months ago c/o generalized weakness, both upper and lower extremities , with unsteady gait, requests in home PT ROS Denies recent fever or chills. Denies sinus pressure, nasal congestion, ear pain or sore throat. Denies chest congestion, productive cough or wheezing. Denies chest pains, palpitations and leg swelling Denies abdominal pain, nausea, vomiting,diarrhea or constipation.   Denies dysuria, frequency, hesitancy or incontinence.  Denies headaches, seizures, numbness, or tingling. Denies uncontrolled  depression, anxiety or insomnia. Denies skin break down or rash.   PE  BP 120/82 (BP Location: Right Arm, Patient Position: Sitting, Cuff Size: Normal)   Pulse 88   Temp (!) 96.9 F (36.1 C) (Temporal)   Resp 18   Ht 5\' 7"  (1.702 m)   Wt 193 lb (87.5 kg)   SpO2 95%   BMI 30.23 kg/m   Patient alert and oriented and in no cardiopulmonary distress. MMSE: 30/30 on 05/27/2016 HEENT: No facial asymmetry, EOMI,   oropharynx pink and moist.  Neck supple no JVD, no mass.  Chest: Clear to auscultation bilaterally.  Left axilla examined : no mass or tenderness, warmth or erythema  CVS: S1, S2 no murmurs, no S3.Regular rate.  ABD: Soft non  tender.   Ext: No edema  MS: Adequate ROM spine, shoulders, hips and knees.  Skin: Intact, no ulcerations or rash noted.  Psych: Good eye contact, normal affect. Memory intact not anxious or depressed appearing.  CNS: CN 2-12 intact, power,  normal throughout.no focal deficits noted.   Assessment & Plan  Essential hypertension Controlled, no change in medication DASH diet and commitment to daily physical activity for a minimum of 30 minutes discussed and encouraged, as a part of hypertension management. The importance of attaining a healthy weight is also discussed.  BP/Weight 05/27/2016 04/02/2016 12/30/2015 07/24/2015 05/07/2015 03/27/252 04/01/621  Systolic BP 762 831 517 616 073 710 -  Diastolic BP 82 80 74 70 84 66 -  Wt. (Lbs) 193 194 194.12 194 192.12 - 196.8  BMI 30.23 30.38 30.4 30.38 30.08 - 30.82       Insomnia Sleep hygiene reviewed and written information offered also. Prescription sent for  medication needed.   Hyperlipidemia Hyperlipidemia:Low fat diet discussed and encouraged.   Lipid Panel  Lab Results  Component Value Date   CHOL 183 12/30/2015   HDL 68 12/30/2015   LDLCALC 85 12/30/2015   TRIG 148 12/30/2015   CHOLHDL 2.7 12/30/2015  Updated lab needed at/ before next visit.      Type 2 HSV infection of vulvovaginal region Chronic use of acyclovir keeps pt symptom free  Unsteady gait c/o generalized weakness and unsteady gait , agreeable to in home PT , will refer for twice weekly visits for 3 weeks

## 2016-05-31 NOTE — Assessment & Plan Note (Signed)
Controlled, no change in medication DASH diet and commitment to daily physical activity for a minimum of 30 minutes discussed and encouraged, as a part of hypertension management. The importance of attaining a healthy weight is also discussed.  BP/Weight 05/27/2016 04/02/2016 12/30/2015 07/24/2015 05/07/2015 7/0/0174 10/27/4965  Systolic BP 591 638 466 599 357 017 -  Diastolic BP 82 80 74 70 84 66 -  Wt. (Lbs) 193 194 194.12 194 192.12 - 196.8  BMI 30.23 30.38 30.4 30.38 30.08 - 30.82

## 2016-06-01 ENCOUNTER — Encounter (INDEPENDENT_AMBULATORY_CARE_PROVIDER_SITE_OTHER): Payer: Self-pay | Admitting: *Deleted

## 2016-06-05 ENCOUNTER — Other Ambulatory Visit: Payer: Self-pay

## 2016-06-05 MED ORDER — TEMAZEPAM 30 MG PO CAPS: 30.0000 mg | ORAL_CAPSULE | Freq: Every evening | ORAL | 1 refills | Status: DC | PRN

## 2016-06-15 ENCOUNTER — Ambulatory Visit (HOSPITAL_COMMUNITY)
Admission: RE | Admit: 2016-06-15 | Discharge: 2016-06-15 | Disposition: A | Discharge status: Home / Self Care | Payer: Medicare HMO | Source: Ambulatory Visit | Attending: Family Medicine | Admitting: Family Medicine

## 2016-06-15 DIAGNOSIS — Z1231 Encounter for screening mammogram for malignant neoplasm of breast: Secondary | ICD-10-CM | POA: Diagnosis not present

## 2016-06-22 ENCOUNTER — Telehealth: Payer: Self-pay

## 2016-06-22 ENCOUNTER — Other Ambulatory Visit: Payer: Self-pay | Admitting: Family Medicine

## 2016-06-22 NOTE — Telephone Encounter (Signed)
Called pt to schedule AWV with Albina Billet, left message for return call - anr

## 2016-08-03 ENCOUNTER — Ambulatory Visit (INDEPENDENT_AMBULATORY_CARE_PROVIDER_SITE_OTHER): Payer: Medicare HMO | Admitting: Family Medicine

## 2016-08-03 ENCOUNTER — Encounter: Payer: Self-pay | Admitting: Family Medicine

## 2016-08-03 VITALS — BP 110/68 | HR 76 | Resp 16 | Ht 67.0 in | Wt 192.0 lb

## 2016-08-03 DIAGNOSIS — F5104 Psychophysiologic insomnia: Secondary | ICD-10-CM | POA: Diagnosis not present

## 2016-08-03 DIAGNOSIS — E784 Other hyperlipidemia: Secondary | ICD-10-CM

## 2016-08-03 DIAGNOSIS — A6004 Herpesviral vulvovaginitis: Secondary | ICD-10-CM

## 2016-08-03 DIAGNOSIS — F418 Other specified anxiety disorders: Secondary | ICD-10-CM | POA: Diagnosis not present

## 2016-08-03 DIAGNOSIS — R69 Illness, unspecified: Secondary | ICD-10-CM | POA: Diagnosis not present

## 2016-08-03 DIAGNOSIS — I10 Essential (primary) hypertension: Secondary | ICD-10-CM | POA: Diagnosis not present

## 2016-08-03 DIAGNOSIS — E7849 Other hyperlipidemia: Secondary | ICD-10-CM

## 2016-08-03 MED ORDER — FLUOXETINE HCL (PMDD) 10 MG PO TABS: ORAL_TABLET | ORAL | 3 refills | Status: DC

## 2016-08-03 MED ORDER — POTASSIUM CHLORIDE ER 10 MEQ PO TBCR: EXTENDED_RELEASE_TABLET | ORAL | 0 refills | Status: DC

## 2016-08-03 MED ORDER — FUROSEMIDE 20 MG PO TABS: ORAL_TABLET | ORAL | 0 refills | Status: DC

## 2016-08-03 NOTE — Progress Notes (Signed)
   Theresa Garcia     MRN: [id number]      DOB: [birth year]   HPI Ms. Drone is here for follow up and re-evaluation of chronic medical conditions, medication management and review of any available recent lab and radiology data.  Preventive health is updated, specifically  Cancer screening and Immunization.   Questions or concerns regarding consultations or procedures which the PT has had in the interim are  addressed. The PT denies any adverse reactions to current medications since the last visit.  Tc/o increased anxiety and stress with the recent loss of her brother requests medsication, does not want therapy Requests medication for intermittent ankle swelling  ROS Denies recent fever or chills. Denies sinus pressure, nasal congestion, ear pain or sore throat. Denies chest congestion, productive cough or wheezing. Denies chest pains, palpitations  Denies abdominal pain, nausea, vomiting,adiarrhea or constipation.   Denies dysuria, frequency, hesitancy or incontinence. Denies joint pain, swelling and limitation in mobility. Denies headaches, seizures, numbness,  PE  BP 110/68   Pulse 76   Resp 16   Ht 5\' 7"  (1.702 m)   Wt 192 lb (87.1 kg)   SpO2 93%   BMI 30.07 kg/m   Patient alert and oriented and in no cardiopulmonary distress.  HEENT: No facial asymmetry, EOMI,   oropharynx pink and moist.  Neck supple no JVD, no mass.  Chest: Clear to auscultation bilaterally.  CVS: S1, S2 no murmurs, no S3.Regular rate.  ABD: Soft non tender.   Ext: trace leg  edema  MS: Adequate ROM spine, shoulders, hips and knees.  Skin: Intact, no ulcerations or rash noted.  Psych: Good eye contact, normal affect. Memory intact not anxious mildly depressed appearing.  CNS: CN 2-12 intact, power,  normal throughout.no focal deficits noted.   Assessment & Plan  Essential hypertension Controlled, no change in medication DASH diet and commitment to daily physical activity for a minimum  of 30 minutes discussed and encouraged, as a part of hypertension management. The importance of attaining a healthy weight is also discussed.  BP/Weight 08/03/2016 05/27/2016 04/02/2016 12/30/2015 07/24/2015 06/17/9561 09/29/5641  Systolic BP 329 518 841 660 630 160 109  Diastolic BP 68 82 80 74 70 84 66  Wt. (Lbs) 192 193 194 194.12 194 192.12 -  BMI 30.07 30.23 30.38 30.4 30.38 30.08 -       Depression with anxiety Increased with recent loss o her brother , was on low dose fluoxetine but now out of medications, requests refill;l and refuses therapy will refill same, sister who accompanies her states she is doing well overall, med alone is sufficient, she is not suicidal or homicidal and functional  Hyperlipidemia Hyperlipidemia:Low fat diet discussed and encouraged.   Lipid Panel  Lab Results  Component Value Date   CHOL 183 12/30/2015   HDL 68 12/30/2015   LDLCALC 85 12/30/2015   TRIG 148 12/30/2015   CHOLHDL 2.7 12/30/2015     Updated lab needed .   Insomnia Sleep hygiene reviewed and written information offered also. Prescription sent for  medication needed. Controlled, no change in medication   Type 2 HSV infection of vulvovaginal region Controlled with chronic suppressive medication

## 2016-08-03 NOTE — Assessment & Plan Note (Signed)
Controlled with chronic suppressive medication

## 2016-08-03 NOTE — Assessment & Plan Note (Signed)
Increased with recent loss o her brother , was on low dose fluoxetine but now out of medications, requests refill;l and refuses therapy will refill same, sister who accompanies her states she is doing well overall, med alone is sufficient, she is not suicidal or homicidal and functional

## 2016-08-03 NOTE — Assessment & Plan Note (Signed)
Controlled, no change in medication DASH diet and commitment to daily physical activity for a minimum of 30 minutes discussed and encouraged, as a part of hypertension management. The importance of attaining a healthy weight is also discussed.  BP/Weight 08/03/2016 05/27/2016 04/02/2016 12/30/2015 07/24/2015 07/11/3435 04/28/7895  Systolic BP 847 841 [phone] 597  Diastolic BP 68 82 80 74 70 84 66  Wt. (Lbs) 192 193 194 194.12 194 192.12 -  BMI 30.07 30.23 30.38 30.4 30.38 30.08 -

## 2016-08-03 NOTE — Assessment & Plan Note (Signed)
Hyperlipidemia:Low fat diet discussed and encouraged.   Lipid Panel  Lab Results  Component Value Date   CHOL 183 12/30/2015   HDL 68 12/30/2015   LDLCALC 85 12/30/2015   TRIG 148 12/30/2015   CHOLHDL 2.7 12/30/2015     Updated lab needed .

## 2016-08-03 NOTE — Patient Instructions (Signed)
Wellness with nurse past due   Annual physical with MD end Novemebr, call if you need me sooner  Medications prescribed as discussed  Fasting lipid, cmp in 2 weeks please  Please schedule colonoscopy with Dr Laural Golden,.  Prayers and condolence on your recent loss  Thanks for choosing [facility], we consider it a privelige to serve you.

## 2016-08-03 NOTE — Assessment & Plan Note (Signed)
Sleep hygiene reviewed and written information offered also. Prescription sent for  medication needed. Controlled, no change in medication  

## 2016-08-14 ENCOUNTER — Other Ambulatory Visit: Payer: Self-pay | Admitting: Family Medicine

## 2016-09-03 DIAGNOSIS — E784 Other hyperlipidemia: Secondary | ICD-10-CM | POA: Diagnosis not present

## 2016-09-03 DIAGNOSIS — I10 Essential (primary) hypertension: Secondary | ICD-10-CM | POA: Diagnosis not present

## 2016-09-04 LAB — LIPID PANEL
Cholesterol: 156 mg/dL (ref <200)
HDL: 63 mg/dL (ref >50)
LDL Cholesterol: 72 mg/dL (ref <100)
Total CHOL/HDL Ratio: 2.5 Ratio (ref <5.0)
Triglycerides: 107 mg/dL (ref <150)
VLDL: 21 mg/dL (ref <30)

## 2016-09-04 LAB — COMPREHENSIVE METABOLIC PANEL
ALT: 14 U/L (ref 6–29)
AST: 22 U/L (ref 10–35)
Albumin: 4.3 g/dL (ref 3.6–5.1)
Alkaline Phosphatase: 59 U/L (ref 33–130)
BUN: 13 mg/dL (ref 7–25)
CO2: 24 mmol/L (ref 20–31)
Calcium: 9.8 mg/dL (ref 8.6–10.4)
Chloride: 106 mmol/L (ref 98–110)
Creat: 0.96 mg/dL — ABNORMAL HIGH (ref 0.60–0.93)
Glucose, Bld: 87 mg/dL (ref 65–99)
Potassium: 4.7 mmol/L (ref 3.5–5.3)
Sodium: 140 mmol/L (ref 135–146)
Total Bilirubin: 0.6 mg/dL (ref 0.2–1.2)
Total Protein: 7 g/dL (ref 6.1–8.1)

## 2016-09-07 ENCOUNTER — Encounter: Payer: Self-pay | Admitting: Family Medicine

## 2016-09-07 ENCOUNTER — Ambulatory Visit (INDEPENDENT_AMBULATORY_CARE_PROVIDER_SITE_OTHER): Payer: Medicare HMO

## 2016-09-07 VITALS — BP 120/78 | HR 76 | Temp 98.5°F | Ht 67.0 in | Wt 191.1 lb

## 2016-09-07 DIAGNOSIS — Z0000 Encounter for general adult medical examination without abnormal findings: Secondary | ICD-10-CM | POA: Diagnosis not present

## 2016-09-07 NOTE — Progress Notes (Signed)
Subjective:   Theresa Garcia is a 76 y.o. female who presents for Medicare Annual (Subsequent) preventive examination.  Review of Systems:  Cardiac Risk Factors include: advanced age (>59men, >97 women);dyslipidemia;hypertension;obesity (BMI >30kg/m2);sedentary lifestyle     Objective:     Vitals: BP 120/78   Pulse 76   Temp 98.5 F (36.9 C) (Oral)   Ht 5\' 7"  (1.702 m)   Wt 191 lb 1.9 oz (86.7 kg)   BMI 29.93 kg/m   Body mass index is 29.93 kg/m.   Tobacco History  Smoking Status  . Former Smoker  . Packs/day: 1.00  . Years: 5.00  . Types: Cigarettes  . Quit date: 09/07/1980  Smokeless Tobacco  . Never Used    Comment: quit 25+ years ago     Counseling given: Not Answered   Past Medical History:  Diagnosis Date  . Bronchitis   . Cancer ([place])   . Chronic back pain   . Depression   . GERD (gastroesophageal reflux disease)   . Hyperlipidemia   . Hypertension   . Insomnia   . Kidney stone   . Pneumonia   . Vertigo    Past Surgical History:  Procedure Laterality Date  . ABDOMINAL HYSTERECTOMY    . BREAST SURGERY     left  . CYSTOSCOPY/RETROGRADE/URETEROSCOPY/STONE EXTRACTION WITH BASKET  01/07/2011   Procedure: CYSTOSCOPY/RETROGRADE/URETEROSCOPY/STONE EXTRACTION WITH BASKET;  Surgeon: Marissa Nestle;  Location: [facility];  Service: Urology;  Laterality: Left;  Balloon Dilatation; stone given to family per MD  . TUBAL LIGATION     Family History  Problem Relation Age of Onset  . COPD Father   . COPD Sister   . COPD Sister   . Diabetes Sister   . Diabetes Brother   . Seizures Mother   . Bone cancer Brother   . Heart disease Brother   . Aneurysm Brother        brain  . HIV/AIDS Brother    History  Sexual Activity  . Sexual activity: Not Currently    Outpatient Encounter Prescriptions as of 09/07/2016  Medication Sig  . acetaminophen (TYLENOL) 500 MG tablet Take 1,000 mg by mouth every 6 (six) hours as needed.  Marland Kitchen acyclovir (ZOVIRAX) 400 MG  tablet TAKE ONE TABLET BY MOUTH TWICE DAILY  . amLODipine-benazepril (LOTREL) 5-40 MG capsule TAKE 1 CAPSULE DAILY  . aspirin 81 MG tablet Take 81 mg by mouth daily. Does not take every day  . atorvastatin (LIPITOR) 20 MG tablet TAKE 1 TABLET EVERY EVENING  . clotrimazole-betamethasone (LOTRISONE) cream Apply 1 application topically 2 (two) times daily. For 10 days (Patient taking differently: Apply 1 application topically 2 (two) times daily as needed. )  . Fluoxetine HCl, PMDD, 10 MG TABS One tablet daily  . furosemide (LASIX) 20 MG tablet One tablet twice weekly as needed for ankle swelling  . potassium chloride (KLOR-CON 10) 10 MEQ tablet One tablet twice weekly onlyon the days you take the lasix ( fluid pill)  . temazepam (RESTORIL) 30 MG capsule Take 1 capsule (30 mg total) by mouth at bedtime as needed.  . vitamin C (ASCORBIC ACID) 500 MG tablet Take 500 mg by mouth daily.  . [DISCONTINUED] Cyanocobalamin (B-12) 1000 MCG CAPS Take 1 capsule by mouth daily.   No facility-administered encounter medications on file as of 09/07/2016.     Activities of Daily Living In your present state of health, do you have any difficulty performing the following activities: 09/07/2016 05/27/2016  Hearing? N N  Vision? N N  Difficulty concentrating or making decisions? N N  Walking or climbing stairs? N N  Dressing or bathing? N N  Doing errands, shopping? N N  Preparing Food and eating ? N -  Using the Toilet? N -  In the past six months, have you accidently leaked urine? N -  Do you have problems with loss of bowel control? N -  Managing your Medications? N -  Managing your Finances? N -  Housekeeping or managing your Housekeeping? N -  Some recent data might be hidden    Patient Care Team: Fayrene Helper, MD as PCP - General    Assessment:    Exercise Activities and Dietary recommendations Current Exercise Habits: The patient does not participate in regular exercise at present, Exercise  limited by: None identified  Goals    . Exercise 3x per week (30 min per time)          Recommend starting a routine exercise program at least 3 days a week for 30-45 minutes at a time as tolerated.       Fall Risk Fall Risk  09/07/2016 05/27/2016 07/24/2015 02/12/2015 04/02/2014  Falls in the past year? No No No No No   Depression Screen PHQ 2/9 Scores 09/07/2016 05/27/2016 12/30/2015 07/24/2015  PHQ - 2 Score 0 0 5 0  PHQ- 9 Score - - 16 -     Cognitive Function MMSE - Mini Mental State Exam 05/27/2016 07/24/2015  Orientation to time 5 5  Orientation to Place 5 5  Registration 3 3  Attention/ Calculation 5 5  Recall 3 2  Language- name 2 objects 2 2  Language- repeat 1 1  Language- follow 3 step command 3 3  Language- read & follow direction 1 1  Write a sentence 1 1  Copy design 1 1  Total score 30 29     6CIT Screen 09/07/2016  What Year? 0 points  What month? 0 points  What time? 0 points  Count back from 20 0 points  Months in reverse 0 points  Repeat phrase 0 points  Total Score 0    Immunization History  Administered Date(s) Administered  . Influenza Split 12/18/2010  . Influenza Whole 01/13/2010  . Influenza,inj,Quad PF,36+ Mos 11/28/2012, 11/27/2013, 12/17/2014, 12/30/2015  . Pneumococcal Conjugate-13 03/14/2014  . Pneumococcal Polysaccharide-23 10/10/2012   Screening Tests Health Maintenance  Topic Date Due  . COLONOSCOPY  02/21/1993  . DEXA SCAN  02/22/2008  . TETANUS/TDAP  10/07/2016 (Originally [birth year]/1962)  . INFLUENZA VACCINE  09/23/2016  . MAMMOGRAM  06/16/2018  . PNA vac Low Risk Adult  Completed      Plan:   I have personally reviewed and noted the following in the patient's chart:   . Medical and social history . Use of alcohol, tobacco or illicit drugs  . Current medications and supplements . Functional ability and status . Nutritional status . Physical activity . Advanced directives . List of other physicians . Hospitalizations,  surgeries, and ER visits in previous 12 months . Vitals . Screenings to include cognitive, depression, and falls . Referrals and appointments  In addition, I have reviewed and discussed with patient certain preventive protocols, quality metrics, and best practice recommendations. A written personalized care plan for preventive services as well as general preventive health recommendations were provided to patient.     Stormy Fabian, LPN  4/40/1027  Lead Nurse Health Advisor

## 2016-09-07 NOTE — Patient Instructions (Addendum)
Theresa Garcia , Thank you for taking time to come for your Medicare Wellness Visit. I appreciate your ongoing commitment to your health goals. Please review the following plan we discussed and let me know if I can assist you in the future.   Screening recommendations/referrals: Colonoscopy: Due, scheduled today Mammogram: Up to date, next due 05/2017 Bone Density: Due, scheduled today Recommended yearly ophthalmology/optometry visit for glaucoma screening and checkup Recommended yearly dental visit for hygiene and checkup  Vaccinations: Influenza vaccine: Up to date, next due 11/2016 Pneumococcal vaccine: Up to date Tdap vaccine: Due, declines Shingles vaccine: Due, declines    Advanced directives: Advance directive discussed with you today. I have provided a copy for you to complete at home and have notarized. Once this is complete please bring a copy in to our office so we can scan it into your chart.  Conditions/risks identified: Obese, recommend starting a routine exercise program at least 3 days a week for 30-45 minutes at a time as tolerated.   Next appointment: Follow up with Dr. Moshe Cipro on 01/18/2017 at 1:00 pm. Follow up in 1 year for your annual wellness visit.  Preventive Care 65 Years and Older, Female Preventive care refers to lifestyle choices and visits with your health care provider that can promote health and wellness. What does preventive care include?  A yearly physical exam. This is also called an annual well check.  Dental exams once or twice a year.  Routine eye exams. Ask your health care provider how often you should have your eyes checked.  Personal lifestyle choices, including:  Daily care of your teeth and gums.  Regular physical activity.  Eating a healthy diet.  Avoiding tobacco and drug use.  Limiting alcohol use.  Practicing safe sex.  Taking low-dose aspirin every day.  Taking vitamin and mineral supplements as recommended by your health  care provider. What happens during an annual well check? The services and screenings done by your health care provider during your annual well check will depend on your age, overall health, lifestyle risk factors, and family history of disease. Counseling  Your health care provider may ask you questions about your:  Alcohol use.  Tobacco use.  Drug use.  Emotional well-being.  Home and relationship well-being.  Sexual activity.  Eating habits.  History of falls.  Memory and ability to understand (cognition).  Work and work Statistician.  Reproductive health. Screening  You may have the following tests or measurements:  Height, weight, and BMI.  Blood pressure.  Lipid and cholesterol levels. These may be checked every 5 years, or more frequently if you are over 48 years old.  Skin check.  Lung cancer screening. You may have this screening every year starting at age 53 if you have a 30-pack-year history of smoking and currently smoke or have quit within the past 15 years.  Fecal occult blood test (FOBT) of the stool. You may have this test every year starting at age 23.  Flexible sigmoidoscopy or colonoscopy. You may have a sigmoidoscopy every 5 years or a colonoscopy every 10 years starting at age 3.  Hepatitis C blood test.  Hepatitis B blood test.  Sexually transmitted disease (STD) testing.  Diabetes screening. This is done by checking your blood sugar (glucose) after you have not eaten for a while (fasting). You may have this done every 1-3 years.  Bone density scan. This is done to screen for osteoporosis. You may have this done starting at age 79.  Mammogram.  This may be done every 1-2 years. Talk to your health care provider about how often you should have regular mammograms. Talk with your health care provider about your test results, treatment options, and if necessary, the need for more tests. Vaccines  Your health care provider may recommend certain  vaccines, such as:  Influenza vaccine. This is recommended every year.  Tetanus, diphtheria, and acellular pertussis (Tdap, Td) vaccine. You may need a Td booster every 10 years.  Zoster vaccine. You may need this after age 42.  Pneumococcal 13-valent conjugate (PCV13) vaccine. One dose is recommended after age 12.  Pneumococcal polysaccharide (PPSV23) vaccine. One dose is recommended after age 58. Talk to your health care provider about which screenings and vaccines you need and how often you need them. This information is not intended to replace advice given to you by your health care provider. Make sure you discuss any questions you have with your health care provider. Document Released: 03/08/2015 Document Revised: 10/30/2015 Document Reviewed: 12/11/2014 Elsevier Interactive Patient Education  2017 [employer] Prevention in the Home Falls can cause injuries. They can happen to people of all ages. There are many things you can do to make your home safe and to help prevent falls. What can I do on the outside of my home?  Regularly fix the edges of walkways and driveways and fix any cracks.  Remove anything that might make you trip as you walk through a door, such as a raised step or threshold.  Trim any bushes or trees on the path to your home.  Use bright outdoor lighting.  Clear any walking paths of anything that might make someone trip, such as rocks or tools.  Regularly check to see if handrails are loose or broken. Make sure that both sides of any steps have handrails.  Any raised decks and porches should have guardrails on the edges.  Have any leaves, snow, or ice cleared regularly.  Use sand or salt on walking paths during winter.  Clean up any spills in your garage right away. This includes oil or grease spills. What can I do in the bathroom?  Use night lights.  Install grab bars by the toilet and in the tub and shower. Do not use towel bars as grab  bars.  Use non-skid mats or decals in the tub or shower.  If you need to sit down in the shower, use a plastic, non-slip stool.  Keep the floor dry. Clean up any water that spills on the floor as soon as it happens.  Remove soap buildup in the tub or shower regularly.  Attach bath mats securely with double-sided non-slip rug tape.  Do not have throw rugs and other things on the floor that can make you trip. What can I do in the bedroom?  Use night lights.  Make sure that you have a light by your bed that is easy to reach.  Do not use any sheets or blankets that are too big for your bed. They should not hang down onto the floor.  Have a firm chair that has side arms. You can use this for support while you get dressed.  Do not have throw rugs and other things on the floor that can make you trip. What can I do in the kitchen?  Clean up any spills right away.  Avoid walking on wet floors.  Keep items that you use a lot in easy-to-reach places.  If you need to reach something  above you, use a strong step stool that has a grab bar.  Keep electrical cords out of the way.  Do not use floor polish or wax that makes floors slippery. If you must use wax, use non-skid floor wax.  Do not have throw rugs and other things on the floor that can make you trip. What can I do with my stairs?  Do not leave any items on the stairs.  Make sure that there are handrails on both sides of the stairs and use them. Fix handrails that are broken or loose. Make sure that handrails are as long as the stairways.  Check any carpeting to make sure that it is firmly attached to the stairs. Fix any carpet that is loose or worn.  Avoid having throw rugs at the top or bottom of the stairs. If you do have throw rugs, attach them to the floor with carpet tape.  Make sure that you have a light switch at the top of the stairs and the bottom of the stairs. If you do not have them, ask someone to add them for  you. What else can I do to help prevent falls?  Wear shoes that:  Do not have high heels.  Have rubber bottoms.  Are comfortable and fit you well.  Are closed at the toe. Do not wear sandals.  If you use a stepladder:  Make sure that it is fully opened. Do not climb a closed stepladder.  Make sure that both sides of the stepladder are locked into place.  Ask someone to hold it for you, if possible.  Clearly mark and make sure that you can see:  Any grab bars or handrails.  First and last steps.  Where the edge of each step is.  Use tools that help you move around (mobility aids) if they are needed. These include:  Canes.  Walkers.  Scooters.  Crutches.  Turn on the lights when you go into a dark area. Replace any light bulbs as soon as they burn out.  Set up your furniture so you have a clear path. Avoid moving your furniture around.  If any of your floors are uneven, fix them.  If there are any pets around you, be aware of where they are.  Review your medicines with your doctor. Some medicines can make you feel dizzy. This can increase your chance of falling. Ask your doctor what other things that you can do to help prevent falls. This information is not intended to replace advice given to you by your health care provider. Make sure you discuss any questions you have with your health care provider. Document Released: 12/06/2008 Document Revised: 07/18/2015 Document Reviewed: 03/16/2014 Elsevier Interactive Patient Education  2017 [employer].

## 2016-09-14 ENCOUNTER — Other Ambulatory Visit (HOSPITAL_COMMUNITY): Payer: Self-pay

## 2016-09-16 ENCOUNTER — Other Ambulatory Visit: Payer: Self-pay | Admitting: Family Medicine

## 2016-11-03 DIAGNOSIS — I10 Essential (primary) hypertension: Secondary | ICD-10-CM | POA: Diagnosis not present

## 2016-11-03 DIAGNOSIS — Z0100 Encounter for examination of eyes and vision without abnormal findings: Secondary | ICD-10-CM | POA: Diagnosis not present

## 2016-11-30 ENCOUNTER — Ambulatory Visit (INDEPENDENT_AMBULATORY_CARE_PROVIDER_SITE_OTHER): Payer: Medicare HMO | Admitting: Family Medicine

## 2016-11-30 VITALS — BP 130/80 | HR 88 | Temp 97.5°F | Resp 16 | Ht 67.0 in | Wt 192.5 lb

## 2016-11-30 DIAGNOSIS — Z23 Encounter for immunization: Secondary | ICD-10-CM

## 2016-11-30 DIAGNOSIS — R1110 Vomiting, unspecified: Secondary | ICD-10-CM

## 2016-11-30 DIAGNOSIS — I10 Essential (primary) hypertension: Secondary | ICD-10-CM

## 2016-11-30 DIAGNOSIS — R110 Nausea: Secondary | ICD-10-CM | POA: Diagnosis not present

## 2016-11-30 DIAGNOSIS — F99 Mental disorder, not otherwise specified: Secondary | ICD-10-CM | POA: Diagnosis not present

## 2016-11-30 DIAGNOSIS — F5105 Insomnia due to other mental disorder: Secondary | ICD-10-CM | POA: Diagnosis not present

## 2016-11-30 DIAGNOSIS — F418 Other specified anxiety disorders: Secondary | ICD-10-CM | POA: Diagnosis not present

## 2016-11-30 DIAGNOSIS — R5383 Other fatigue: Secondary | ICD-10-CM

## 2016-11-30 DIAGNOSIS — R69 Illness, unspecified: Secondary | ICD-10-CM | POA: Diagnosis not present

## 2016-11-30 DIAGNOSIS — F5089 Other specified eating disorder: Secondary | ICD-10-CM

## 2016-11-30 LAB — COMPLETE METABOLIC PANEL WITH GFR
AG Ratio: 1.5 (calc) (ref 1.0–2.5)
ALT: 39 U/L — ABNORMAL HIGH (ref 6–29)
AST: 22 U/L (ref 10–35)
Albumin: 4.6 g/dL (ref 3.6–5.1)
Alkaline phosphatase (APISO): 122 U/L (ref 33–130)
BUN: 9 mg/dL (ref 7–25)
CO2: 26 mmol/L (ref 20–32)
Calcium: 10.3 mg/dL (ref 8.6–10.4)
Chloride: 105 mmol/L (ref 98–110)
Creat: 0.78 mg/dL (ref 0.60–0.93)
GFR, Est African American: 87 mL/min/{1.73_m2} (ref >=60)
GFR, Est Non African American: 75 mL/min/{1.73_m2} (ref >=60)
Globulin: 3.1 g/dL (calc) (ref 1.9–3.7)
Glucose, Bld: 88 mg/dL (ref 65–99)
Potassium: 4.9 mmol/L (ref 3.5–5.3)
Sodium: 142 mmol/L (ref 135–146)
Total Bilirubin: 0.6 mg/dL (ref 0.2–1.2)
Total Protein: 7.7 g/dL (ref 6.1–8.1)

## 2016-11-30 LAB — CBC
HCT: 42.9 % (ref 35.0–45.0)
Hemoglobin: 14.3 g/dL (ref 11.7–15.5)
MCH: 31.4 pg (ref 27.0–33.0)
MCHC: 33.3 g/dL (ref 32.0–36.0)
MCV: 94.1 fL (ref 80.0–100.0)
MPV: 10.2 fL (ref 7.5–12.5)
Platelets: 341 10*3/uL (ref 140–400)
RBC: 4.56 10*6/uL (ref 3.80–5.10)
RDW: 12.9 % (ref 11.0–15.0)
WBC: 9.1 10*3/uL (ref 3.8–10.8)

## 2016-11-30 LAB — LIPID PANEL
Cholesterol: 179 mg/dL (ref <200)
HDL: 65 mg/dL (ref >50)
LDL Cholesterol (Calc): 90 mg/dL (calc)
Non-HDL Cholesterol (Calc): 114 mg/dL (calc) (ref <130)
Total CHOL/HDL Ratio: 2.8 (calc) (ref <5.0)
Triglycerides: 142 mg/dL (ref <150)

## 2016-11-30 LAB — TSH: TSH: 0.76 mIU/L (ref 0.40–4.50)

## 2016-11-30 MED ORDER — MECLIZINE HCL 25 MG PO TABS: 25.0000 mg | ORAL_TABLET | Freq: Three times a day (TID) | ORAL | 1 refills | Status: DC | PRN

## 2016-11-30 NOTE — Patient Instructions (Addendum)
F/U in 3 months, call if you need me before  Flu vaccine today  CBC, lipid, cmp  And TSH today due to fatigue, nausea and vomiting

## 2016-12-01 ENCOUNTER — Encounter: Payer: Self-pay | Admitting: Family Medicine

## 2016-12-06 ENCOUNTER — Encounter: Payer: Self-pay | Admitting: Family Medicine

## 2016-12-06 NOTE — Assessment & Plan Note (Signed)
Generalized fatigue associated with untreated depression , basic lab panel to r/o any other underlying pathology

## 2016-12-06 NOTE — Assessment & Plan Note (Signed)
Continue restoril at  Current dose , reports adequate sleep

## 2016-12-06 NOTE — Progress Notes (Signed)
   Theresa Garcia     MRN: [id number]      DOB: 03-Aug-1940   HPI Theresa Garcia is here with a 2 week h/o nausea, vomit fatigue, loose stool  and vertigo. Sister who accompanies heer states that 2 weeks ago she was so ill that she wanted to take her to the ED , but pt refused, she was afraid that she had developed obstruction as in the past, she refused all medical attention, she also c/o vertigo and ;lightheadedness at the time States that she is a her wits end, doe not know how to care for her sister, who does not follow through on plans to get out and do more, she is at home all the time, and wishes she would go visit her son  Out of town, Anarely states she is afraid of airports and that her son is actually coming to visit her soon  ROS  Denies sinus pressure, nasal congestion, ear pain or sore throat. Denies chest congestion, productive cough or wheezing. Denies chest pains, palpitations and leg swelling .   Denies dysuria, frequency, hesitancy or incontinence. Denies joint pain, swelling and limitation in mobility. Denies headaches, seizures, numbness, or tingling.  Denies skin break down or rash.   PE Ill appearing depressed  Elderly lady flat affect  BP 130/80 (BP Location: Right Arm, Patient Position: Sitting, Cuff Size: Normal)   Pulse 88   Temp (!) 97.5 F (36.4 C) (Other (Comment))   Resp 16   Ht 5\' 7"  (1.702 m)   Wt 192 lb 8 oz (87.3 kg)   SpO2 97%   BMI 30.15 kg/m   Patient oriented and in no cardiopulmonary distress.  HEENT: No facial asymmetry, EOMI,   oropharynx pink and moist.  Neck supple no JVD, no mass.  Chest: Clear to auscultation bilaterally.  CVS: S1, S2 no murmurs, no S3.Regular rate.  ABD: Soft no localized tenderness, guarding or rebound   Ext: No edema  MS: Adequate ROM spine, shoulders, hips and knees.  Skin: Intact, no ulcerations or rash noted.  Psych: Poor  eye contact, flat affect. Memory intact  anxious and  depressed  appearing.  CNS: CN 2-12 intact, power,  normal throughout.no focal deficits noted.   Assessment & Plan  Nausea & vomiting 2 week history, no evidence of dehydration on exam, basic labs to be obtained, likely large overlying psychologic  componenetcomponenet  Depression with anxiety Uncontrolled , inadequately treated , pt resistant to appropriate treatment which will include higher dose of fluoxetine which was recently attempted , and psychiatric care which has been repeatedly rejected included currently. She is not suicidal or homicidal  Insomnia Continue restoril at  Current dose , reports adequate sleep  Essential hypertension Controlled, no change in medication   Fatigue Generalized fatigue associated with untreated depression , basic lab panel to r/o any other underlying pathology

## 2016-12-06 NOTE — Assessment & Plan Note (Signed)
2 week history, no evidence of dehydration on exam, basic labs to be obtained, likely large overlying psychologic  componenetcomponenet

## 2016-12-06 NOTE — Assessment & Plan Note (Signed)
Uncontrolled , inadequately treated , pt resistant to appropriate treatment which will include higher dose of fluoxetine which was recently attempted , and psychiatric care which has been repeatedly rejected included currently. She is not suicidal or homicidal

## 2016-12-06 NOTE — Assessment & Plan Note (Signed)
Controlled, no change in medication  

## 2016-12-11 ENCOUNTER — Other Ambulatory Visit: Payer: Self-pay | Admitting: Family Medicine

## 2016-12-16 ENCOUNTER — Other Ambulatory Visit: Payer: Self-pay | Admitting: Family Medicine

## 2016-12-21 ENCOUNTER — Other Ambulatory Visit: Payer: Self-pay | Admitting: Family Medicine

## 2016-12-21 ENCOUNTER — Telehealth: Payer: Self-pay | Admitting: Family Medicine

## 2016-12-21 NOTE — Telephone Encounter (Signed)
Patient is requesting a 90 day supply of temazepam 30mg  be sent to   [facility] in [place]

## 2016-12-21 NOTE — Telephone Encounter (Signed)
Medication sent.

## 2017-01-18 ENCOUNTER — Encounter: Payer: Self-pay | Admitting: Family Medicine

## 2017-01-18 ENCOUNTER — Ambulatory Visit (INDEPENDENT_AMBULATORY_CARE_PROVIDER_SITE_OTHER): Payer: Medicare HMO | Admitting: Family Medicine

## 2017-01-18 VITALS — BP 112/80 | HR 66 | Resp 16 | Ht 67.0 in | Wt 194.0 lb

## 2017-01-18 DIAGNOSIS — Z0000 Encounter for general adult medical examination without abnormal findings: Secondary | ICD-10-CM | POA: Diagnosis not present

## 2017-01-18 DIAGNOSIS — Z780 Asymptomatic menopausal state: Secondary | ICD-10-CM | POA: Diagnosis not present

## 2017-01-18 DIAGNOSIS — Z1211 Encounter for screening for malignant neoplasm of colon: Secondary | ICD-10-CM | POA: Diagnosis not present

## 2017-01-18 LAB — POC HEMOCCULT BLD/STL (OFFICE/1-CARD/DIAGNOSTIC): Fecal Occult Blood, POC: NEGATIVE

## 2017-01-18 NOTE — Patient Instructions (Addendum)
Wellness with nurse  In April call if you n need me before  Appt for bone density will be provided at checkout  MD follow up in 6 months, call if you need me before  Pls call and sched appt with Dr Olevia Perches office for colonoscopy, letter he sent is given to you  PLEASE commit to daily ecxercise , increased interaction with family abnd community  Labs  Are great  I will be looking out for results from outstanding tests, bone density and colonoscopy

## 2017-01-21 ENCOUNTER — Encounter: Payer: Self-pay | Admitting: Family Medicine

## 2017-01-21 NOTE — Progress Notes (Signed)
    Theresa Garcia     MRN: [id number]      DOB: Dec 03, 1940  HPI: Patient is in for annual physical exam. No other health concerns are expressed or addressed at the visit. Recent labs, if available are reviewed. Immunization is reviewed , and  updated if needed.   PE: BP 112/80   Pulse 66   Resp 16   Ht 5\' 7"  (1.702 m)   Wt 194 lb (88 kg)   SpO2 96%   BMI 30.38 kg/m   Pleasant  female, alert and oriented x 3, in no cardio-pulmonary distress. Afebrile. HEENT No facial trauma or asymetry. Sinuses non tender.  Extra occullar muscles intact, pupils equally reactive to light. External ears normal, tympanic membranes clear. Oropharynx moist, no exudate. Neck: supple, no adenopathy,JVD or thyromegaly.No bruits.  Chest: Clear to ascultation bilaterally.No crackles or wheezes. Non tender to palpation  Breast: No asymetry,no masses or lumps. No tenderness. No nipple discharge or inversion. No axillary or supraclavicular adenopathy  Cardiovascular system; Heart sounds normal,  S1 and  S2 ,no S3.  No murmur, or thrill. Apical beat not displaced Peripheral pulses normal.  Abdomen: Soft, non tender, no organomegaly or masses. No bruits. Bowel sounds normal. No guarding, tenderness or rebound.  Rectal:  Normal sphincter tone. No rectal mass. Guaiac negative stool.  GU: Not examined   Musculoskeletal exam: Full ROM of spine, hips , shoulders and knees. No deformity ,swelling or crepitus noted. No muscle wasting or atrophy.   Neurologic: Cranial nerves 2 to 12 intact. Power, tone ,sensation and reflexes normal throughout. No disturbance in gait. No tremor.  Skin: Intact, no ulceration, erythema , scaling or rash noted. Pigmentation normal throughout  Psych; Normal mood and affect. Judgement and concentration normal   Assessment & Plan:  Annual physical exam Annual exam as documented.  Changes in health habits are decided on by the patient with goals and  time frames  set for achieving them. Immunization and cancer screening needs are specifically addressed at this visit.

## 2017-01-21 NOTE — Assessment & Plan Note (Signed)
Annual exam as documented.  Changes in health habits are decided on by the patient with goals and time frames  set for achieving them. Immunization and cancer screening needs are specifically addressed at this visit.  

## 2017-01-25 ENCOUNTER — Ambulatory Visit (HOSPITAL_COMMUNITY)
Admission: RE | Admit: 2017-01-25 | Discharge: 2017-01-25 | Disposition: A | Discharge status: Home / Self Care | Payer: Medicare HMO | Source: Ambulatory Visit | Attending: Family Medicine | Admitting: Family Medicine

## 2017-01-25 DIAGNOSIS — M8580 Other specified disorders of bone density and structure, unspecified site: Secondary | ICD-10-CM | POA: Diagnosis not present

## 2017-01-25 DIAGNOSIS — M8589 Other specified disorders of bone density and structure, multiple sites: Secondary | ICD-10-CM | POA: Diagnosis not present

## 2017-01-25 DIAGNOSIS — Z780 Asymptomatic menopausal state: Secondary | ICD-10-CM

## 2017-01-25 DIAGNOSIS — Z13820 Encounter for screening for osteoporosis: Secondary | ICD-10-CM | POA: Insufficient documentation

## 2017-02-10 ENCOUNTER — Other Ambulatory Visit: Payer: Self-pay | Admitting: Family Medicine

## 2017-02-10 NOTE — Telephone Encounter (Signed)
Seen 11 26 18

## 2017-03-17 ENCOUNTER — Other Ambulatory Visit: Payer: Self-pay | Admitting: Family Medicine

## 2017-03-18 ENCOUNTER — Encounter (HOSPITAL_COMMUNITY): Payer: Self-pay

## 2017-03-18 ENCOUNTER — Other Ambulatory Visit: Payer: Self-pay

## 2017-03-18 ENCOUNTER — Emergency Department (HOSPITAL_COMMUNITY): Payer: Medicare HMO

## 2017-03-18 ENCOUNTER — Inpatient Hospital Stay (HOSPITAL_COMMUNITY)
Admission: EM | Admit: 2017-03-18 | Discharge: 2017-03-24 | DRG: 175 | Disposition: A | Discharge status: Skilled Nursing Facility | Payer: Medicare HMO | Attending: Internal Medicine | Admitting: Internal Medicine

## 2017-03-18 DIAGNOSIS — Z888 Allergy status to other drugs, medicaments and biological substances status: Secondary | ICD-10-CM

## 2017-03-18 DIAGNOSIS — D72829 Elevated white blood cell count, unspecified: Secondary | ICD-10-CM | POA: Diagnosis present

## 2017-03-18 DIAGNOSIS — J810 Acute pulmonary edema: Secondary | ICD-10-CM

## 2017-03-18 DIAGNOSIS — R0602 Shortness of breath: Secondary | ICD-10-CM | POA: Diagnosis not present

## 2017-03-18 DIAGNOSIS — Z86711 Personal history of pulmonary embolism: Secondary | ICD-10-CM

## 2017-03-18 DIAGNOSIS — I248 Other forms of acute ischemic heart disease: Secondary | ICD-10-CM | POA: Diagnosis present

## 2017-03-18 DIAGNOSIS — R197 Diarrhea, unspecified: Secondary | ICD-10-CM | POA: Diagnosis not present

## 2017-03-18 DIAGNOSIS — J9601 Acute respiratory failure with hypoxia: Secondary | ICD-10-CM | POA: Diagnosis not present

## 2017-03-18 DIAGNOSIS — M549 Dorsalgia, unspecified: Secondary | ICD-10-CM | POA: Diagnosis present

## 2017-03-18 DIAGNOSIS — Z808 Family history of malignant neoplasm of other organs or systems: Secondary | ICD-10-CM

## 2017-03-18 DIAGNOSIS — Z90710 Acquired absence of both cervix and uterus: Secondary | ICD-10-CM

## 2017-03-18 DIAGNOSIS — R10817 Generalized abdominal tenderness: Secondary | ICD-10-CM | POA: Diagnosis not present

## 2017-03-18 DIAGNOSIS — G4700 Insomnia, unspecified: Secondary | ICD-10-CM | POA: Diagnosis present

## 2017-03-18 DIAGNOSIS — R778 Other specified abnormalities of plasma proteins: Secondary | ICD-10-CM | POA: Diagnosis present

## 2017-03-18 DIAGNOSIS — J90 Pleural effusion, not elsewhere classified: Secondary | ICD-10-CM | POA: Diagnosis not present

## 2017-03-18 DIAGNOSIS — I9589 Other hypotension: Secondary | ICD-10-CM | POA: Diagnosis present

## 2017-03-18 DIAGNOSIS — I10 Essential (primary) hypertension: Secondary | ICD-10-CM | POA: Diagnosis not present

## 2017-03-18 DIAGNOSIS — Z8701 Personal history of pneumonia (recurrent): Secondary | ICD-10-CM

## 2017-03-18 DIAGNOSIS — Z825 Family history of asthma and other chronic lower respiratory diseases: Secondary | ICD-10-CM

## 2017-03-18 DIAGNOSIS — I471 Supraventricular tachycardia: Secondary | ICD-10-CM | POA: Diagnosis present

## 2017-03-18 DIAGNOSIS — R5381 Other malaise: Secondary | ICD-10-CM | POA: Diagnosis present

## 2017-03-18 DIAGNOSIS — E785 Hyperlipidemia, unspecified: Secondary | ICD-10-CM | POA: Diagnosis present

## 2017-03-18 DIAGNOSIS — G8929 Other chronic pain: Secondary | ICD-10-CM | POA: Diagnosis present

## 2017-03-18 DIAGNOSIS — Z8619 Personal history of other infectious and parasitic diseases: Secondary | ICD-10-CM

## 2017-03-18 DIAGNOSIS — K760 Fatty (change of) liver, not elsewhere classified: Secondary | ICD-10-CM | POA: Diagnosis not present

## 2017-03-18 DIAGNOSIS — Z87442 Personal history of urinary calculi: Secondary | ICD-10-CM

## 2017-03-18 DIAGNOSIS — I509 Heart failure, unspecified: Secondary | ICD-10-CM | POA: Diagnosis not present

## 2017-03-18 DIAGNOSIS — I2699 Other pulmonary embolism without acute cor pulmonale: Principal | ICD-10-CM | POA: Diagnosis present

## 2017-03-18 DIAGNOSIS — R5081 Fever presenting with conditions classified elsewhere: Secondary | ICD-10-CM | POA: Diagnosis present

## 2017-03-18 DIAGNOSIS — Z87891 Personal history of nicotine dependence: Secondary | ICD-10-CM

## 2017-03-18 DIAGNOSIS — F418 Other specified anxiety disorders: Secondary | ICD-10-CM | POA: Diagnosis present

## 2017-03-18 DIAGNOSIS — Z8679 Personal history of other diseases of the circulatory system: Secondary | ICD-10-CM

## 2017-03-18 DIAGNOSIS — J9600 Acute respiratory failure, unspecified whether with hypoxia or hypercapnia: Secondary | ICD-10-CM | POA: Diagnosis present

## 2017-03-18 DIAGNOSIS — R1013 Epigastric pain: Secondary | ICD-10-CM

## 2017-03-18 DIAGNOSIS — Z79899 Other long term (current) drug therapy: Secondary | ICD-10-CM

## 2017-03-18 DIAGNOSIS — K2970 Gastritis, unspecified, without bleeding: Secondary | ICD-10-CM | POA: Diagnosis not present

## 2017-03-18 DIAGNOSIS — Z8249 Family history of ischemic heart disease and other diseases of the circulatory system: Secondary | ICD-10-CM

## 2017-03-18 DIAGNOSIS — Z853 Personal history of malignant neoplasm of breast: Secondary | ICD-10-CM

## 2017-03-18 DIAGNOSIS — R531 Weakness: Secondary | ICD-10-CM | POA: Diagnosis not present

## 2017-03-18 DIAGNOSIS — Z7982 Long term (current) use of aspirin: Secondary | ICD-10-CM

## 2017-03-18 DIAGNOSIS — Z880 Allergy status to penicillin: Secondary | ICD-10-CM

## 2017-03-18 DIAGNOSIS — I361 Nonrheumatic tricuspid (valve) insufficiency: Secondary | ICD-10-CM | POA: Diagnosis not present

## 2017-03-18 DIAGNOSIS — K219 Gastro-esophageal reflux disease without esophagitis: Secondary | ICD-10-CM | POA: Diagnosis present

## 2017-03-18 LAB — COMPREHENSIVE METABOLIC PANEL
ALT: 14 U/L (ref 14–54)
AST: 24 U/L (ref 15–41)
Albumin: 3.9 g/dL (ref 3.5–5.0)
Alkaline Phosphatase: 66 U/L (ref 38–126)
Anion gap: 15 (ref 5–15)
BUN: 9 mg/dL (ref 6–20)
CO2: 20 mmol/L — ABNORMAL LOW (ref 22–32)
Calcium: 9.8 mg/dL (ref 8.9–10.3)
Chloride: 106 mmol/L (ref 101–111)
Creatinine, Ser: 0.88 mg/dL (ref 0.44–1.00)
GFR calc Af Amer: >60 mL/min (ref >60)
GFR calc non Af Amer: >60 mL/min (ref >60)
Glucose, Bld: 148 mg/dL — ABNORMAL HIGH (ref 65–99)
Potassium: 3.6 mmol/L (ref 3.5–5.1)
Sodium: 141 mmol/L (ref 135–145)
Total Bilirubin: 0.8 mg/dL (ref 0.3–1.2)
Total Protein: 8.2 g/dL — ABNORMAL HIGH (ref 6.5–8.1)

## 2017-03-18 LAB — CBC WITH DIFFERENTIAL/PLATELET
Basophils Absolute: 0 10*3/uL (ref 0.0–0.1)
Basophils Relative: 0 %
Eosinophils Absolute: 0 10*3/uL (ref 0.0–0.7)
Eosinophils Relative: 0 %
HCT: 44.9 % (ref 36.0–46.0)
Hemoglobin: 14.9 g/dL (ref 12.0–15.0)
Lymphocytes Relative: 20 %
Lymphs Abs: 3.4 10*3/uL (ref 0.7–4.0)
MCH: 32.3 pg (ref 26.0–34.0)
MCHC: 33.2 g/dL (ref 30.0–36.0)
MCV: 97.2 fL (ref 78.0–100.0)
Monocytes Absolute: 1.8 10*3/uL — ABNORMAL HIGH (ref 0.1–1.0)
Monocytes Relative: 10 %
Neutro Abs: 11.9 10*3/uL — ABNORMAL HIGH (ref 1.7–7.7)
Neutrophils Relative %: 70 %
Platelets: 287 10*3/uL (ref 150–400)
RBC: 4.62 MIL/uL (ref 3.87–5.11)
RDW: 13.2 % (ref 11.5–15.5)
WBC: 17.1 10*3/uL — ABNORMAL HIGH (ref 4.0–10.5)

## 2017-03-18 LAB — LIPASE, BLOOD: Lipase: 28 U/L (ref 11–51)

## 2017-03-18 LAB — TROPONIN I: Troponin I: 0.04 ng/mL (ref <0.03)

## 2017-03-18 LAB — BRAIN NATRIURETIC PEPTIDE: B Natriuretic Peptide: 292 pg/mL — ABNORMAL HIGH (ref 0.0–100.0)

## 2017-03-18 MED ORDER — IOPAMIDOL (ISOVUE-M 300) INJECTION 61%: 15.0000 mL | Freq: Once | INTRAMUSCULAR | Status: DC | PRN

## 2017-03-18 MED ORDER — SODIUM CHLORIDE 0.9 % IV BOLUS (SEPSIS)
250.0000 mL | Freq: Once | INTRAVENOUS | Status: AC
Start: 2017-03-18 — End: 2017-03-18
  Administered 2017-03-18: 250 mL via INTRAVENOUS

## 2017-03-18 MED ORDER — HYDROMORPHONE HCL 1 MG/ML IJ SOLN
0.5000 mg | Freq: Once | INTRAMUSCULAR | Status: AC
  Administered 2017-03-18: 0.5 mg via INTRAVENOUS
  Filled 2017-03-18: qty 1

## 2017-03-18 MED ORDER — IOPAMIDOL (ISOVUE-300) INJECTION 61%
100.0000 mL | Freq: Once | INTRAVENOUS | Status: AC | PRN
  Administered 2017-03-18: 100 mL via INTRAVENOUS

## 2017-03-18 MED ORDER — ONDANSETRON HCL 4 MG/2ML IJ SOLN
4.0000 mg | Freq: Once | INTRAMUSCULAR | Status: AC
  Administered 2017-03-18: 4 mg via INTRAVENOUS
  Filled 2017-03-18: qty 2

## 2017-03-18 MED ORDER — SODIUM CHLORIDE 0.9 % IV SOLN
INTRAVENOUS | Status: DC
  Administered 2017-03-18: 21:00:00 via INTRAVENOUS

## 2017-03-18 NOTE — ED Provider Notes (Signed)
Blood pressure 131/81, pulse 96, temperature 97.7 F (36.5 C), resp. rate (!) 28, height 5\' 7"  (1.702 m), weight 90.7 kg (200 lb), SpO2 97 %.  Assuming care from Dr. Venita Sheffield.  In short, Theresa Garcia is a 77 y.o. female with a chief complaint of Abdominal Pain .  Refer to the original H&P for additional details.  The current plan of care is to follow CT and reassess.  Re-evaluated the patient. She is doing well but has slight tachypnea. CT unremarkable. Concern for new CHF with associated CP. Would benefit from CP r/o.   Discussed patient's case with Hospitalist, Dr. Myna Hidalgo to request admission. Patient and family (if present) updated with plan. Care transferred to Hospitalist service.  I reviewed all nursing notes, vitals, pertinent old records, EKGs, labs, imaging (as available).   EKG Interpretation  Date/Time:  Thursday March 18 2017 20:30:44 EST Ventricular Rate:  137 PR Interval:    QRS Duration: 77 QT Interval:  311 QTC Calculation: 470 R Axis:   6 Text Interpretation:  Ectopic atrial tachycardia, unifocal Consider anterior infarct Confirmed by Fredia Sorrow [phone]) on 03/18/2017 8:52:38 PM      Nanda Quinton, MD    Connor Foxworthy, [facility], MD 03/19/17 [phone]

## 2017-03-18 NOTE — ED Triage Notes (Signed)
Upper abd pain onset tonight, large diarrhea, no vomiting.

## 2017-03-18 NOTE — ED Provider Notes (Addendum)
[facility] EMERGENCY DEPARTMENT Provider Note   CSN: [id number] Arrival date & time: 03/18/17  2023     History   Chief Complaint Chief Complaint  Patient presents with  . Abdominal Pain    HPI Theresa Garcia is a 77 y.o. female.  Patient brought in by EMS.  Patient states she felt fine yesterday and then woke this morning with epigastric abdominal discomfort and lower chest area under both breasts.  This persisted throughout the day.  At about 2:00 this afternoon started having watery diarrhea.  Large amounts.  No vomiting.  Patient arrived tachycardic sinus tachycardia with heart rate in the 130s.  Patient appeared uncomfortable.  Patient denies any vomiting any fevers.  Denies any substernal chest pain or chest pain going to her back.  Patient's past medical history is significant for hypertension GERD and chronic back pain.  But no history of congestive heart failure.  She has had an abdominal hysterectomy.      Past Medical History:  Diagnosis Date  . Bronchitis   . Cancer ([place])   . Chronic back pain   . Depression   . GERD (gastroesophageal reflux disease)   . Hyperlipidemia   . Hypertension   . Insomnia   . Kidney stone   . Pneumonia   . Vertigo     Patient Active Problem List   Diagnosis Date Noted  . Fatigue 05/31/2016  . Unsteady gait 05/31/2016  . Annual physical exam 07/24/2015  . GERD (gastroesophageal reflux disease) 04/27/2015  . Depression with anxiety 11/27/2013  . Type 2 HSV infection of vulvovaginal region 03/12/2012  . Nausea & vomiting 01/06/2011  . History of breast cancer in female 01/13/2010  . Hyperlipidemia 01/13/2010  . Essential hypertension 01/13/2010  . Insomnia 01/13/2010    Past Surgical History:  Procedure Laterality Date  . ABDOMINAL HYSTERECTOMY    . BREAST SURGERY     left  . CYSTOSCOPY/RETROGRADE/URETEROSCOPY/STONE EXTRACTION WITH BASKET  01/07/2011   Procedure: CYSTOSCOPY/RETROGRADE/URETEROSCOPY/STONE EXTRACTION  WITH BASKET;  Surgeon: Marissa Nestle;  Location: [facility];  Service: Urology;  Laterality: Left;  Balloon Dilatation; stone given to family per MD  . TUBAL LIGATION      OB History    No data available       Home Medications    Prior to Admission medications   Medication Sig Start Date End Date Taking? Authorizing Provider  acetaminophen (TYLENOL) 500 MG tablet Take 1,000 mg by mouth every 6 (six) hours as needed.    [provider]  acyclovir (ZOVIRAX) 400 MG tablet TAKE 1 TABLET BY MOUTH TWICE DAILY 09/16/16   Fayrene Helper, MD  amLODipine-benazepril (LOTREL) 5-40 MG capsule TAKE 1 CAPSULE DAILY 02/10/17   Fayrene Helper, MD  aspirin 81 MG tablet Take 81 mg by mouth daily. Does not take every day    [provider]  atorvastatin (LIPITOR) 20 MG tablet TAKE 1 TABLET EVERY EVENING 03/17/17   Fayrene Helper, MD  clotrimazole-betamethasone (LOTRISONE) cream Apply 1 application topically 2 (two) times daily. For 10 days Patient taking differently: Apply 1 application topically 2 (two) times daily as needed.  03/14/14   Fayrene Helper, MD  Fluoxetine HCl, PMDD, 10 MG TABS One tablet daily 08/03/16   Fayrene Helper, MD  furosemide (LASIX) 20 MG tablet One tablet twice weekly as needed for ankle swelling 08/03/16   Fayrene Helper, MD  meclizine (ANTIVERT) 25 MG tablet Take 1 tablet (25 mg total) by  mouth 3 (three) times daily as needed for dizziness. 11/30/16   Fayrene Helper, MD  potassium chloride (KLOR-CON 10) 10 MEQ tablet One tablet twice weekly onlyon the days you take the lasix ( fluid pill) 08/03/16   Fayrene Helper, MD  temazepam (RESTORIL) 30 MG capsule TAKE 1 CAPSULE BY MOUTH AT BEDTIME AS NEEDED 12/18/16   Fayrene Helper, MD  vitamin C (ASCORBIC ACID) 500 MG tablet Take 500 mg by mouth daily.    [provider]    Family History Family History  Problem Relation Age of Onset  . COPD Father   . COPD Sister   .  COPD Sister   . Diabetes Sister   . Diabetes Brother   . Seizures Mother   . Bone cancer Brother   . Heart disease Brother   . Aneurysm Brother        brain  . HIV/AIDS Brother     Social History Social History   Tobacco Use  . Smoking status: Former Smoker    Packs/day: 1.00    Years: 5.00    Pack years: 5.00    Types: Cigarettes    Last attempt to quit: 09/07/1980    Years since quitting: 36.5  . Smokeless tobacco: Never Used  . Tobacco comment: quit 25+ years ago  Substance Use Topics  . Alcohol use: No    Alcohol/week: 0.0 oz  . Drug use: No     Allergies   Meclizine and Penicillins   Review of Systems Review of Systems  Constitutional: Positive for fatigue. Negative for fever.  HENT: Negative for congestion.   Eyes: Negative for visual disturbance.  Respiratory: Negative for shortness of breath.   Cardiovascular: Positive for chest pain.  Gastrointestinal: Positive for abdominal pain and diarrhea. Negative for blood in stool, nausea and vomiting.  Genitourinary: Negative for dysuria.  Musculoskeletal: Negative for back pain.  Neurological: Negative for syncope and headaches.  Hematological: Does not bruise/bleed easily.  Psychiatric/Behavioral: Negative for confusion.     Physical Exam Updated Vital Signs BP 138/85   Pulse (!) 108   Temp 97.7 F (36.5 C)   Resp (!) 33   Ht 1.702 m (5\' 7" )   Wt 90.7 kg (200 lb)   SpO2 95%   BMI 31.32 kg/m   Physical Exam  Constitutional: She is oriented to person, place, and time. She appears well-developed and well-nourished. No distress.  HENT:  Head: Normocephalic and atraumatic.  Mouth/Throat: Oropharynx is clear and moist.  Eyes: Conjunctivae and EOM are normal. Pupils are equal, round, and reactive to light.  Neck: Normal range of motion. Neck supple.  Cardiovascular: Normal rate, regular rhythm and normal heart sounds.  Pulmonary/Chest: Effort normal and breath sounds normal. No respiratory distress.  She has no wheezes.  Abdominal: Soft. Bowel sounds are normal. There is tenderness.  Tender to palpation epigastric area.  Musculoskeletal: Normal range of motion.  Neurological: She is alert and oriented to person, place, and time. No cranial nerve deficit or sensory deficit. She exhibits normal muscle tone. Coordination normal.  Nursing note and vitals reviewed.    ED Treatments / Results  Labs (all labs ordered are listed, but only abnormal results are displayed) Labs Reviewed  CBC WITH DIFFERENTIAL/PLATELET - Abnormal; Notable for the following components:      Result Value   WBC 17.1 (*)    Neutro Abs 11.9 (*)    Monocytes Absolute 1.8 (*)    All other components within normal  limits  C DIFFICILE QUICK SCREEN W PCR REFLEX  TROPONIN I  COMPREHENSIVE METABOLIC PANEL  LIPASE, BLOOD  BRAIN NATRIURETIC PEPTIDE    EKG  EKG Interpretation  Date/Time:  Thursday March 18 2017 20:30:44 EST Ventricular Rate:  137 PR Interval:    QRS Duration: 77 QT Interval:  311 QTC Calculation: 470 R Axis:   6 Text Interpretation:  Ectopic atrial tachycardia, unifocal Consider anterior infarct Confirmed by Fredia Sorrow [phone]) on 03/18/2017 8:52:38 PM       Radiology Dg Chest Port 1 View  Result Date: 03/18/2017 CLINICAL DATA:  Upper abdominal pain onset tonight with diarrhea. EXAM: PORTABLE CHEST 1 VIEW COMPARISON:  None. FINDINGS: Heart is enlarged. Aorta is nonaneurysmal. Atherosclerotic calcifications are seen at the aortic arch. Lung volumes are low. There is pulmonary vascular congestion with probable small left and tiny right effusions. Surgical clips project over the left axilla with small focus of air within the soft tissues of the left axilla possibly related to intervention or air trapped within skin fold. Small focus of soft tissue infection is not entirely excluded. No acute osseous abnormality. IMPRESSION: 1. Low lung volumes with cardiomegaly and CHF. Small left and tiny  right effusions. 2. Small rounded focus of air in the soft tissues of the left axilla, nonspecific. Differential possibilities given above. Electronically Signed   By: Ashley Royalty M.D.   On: 03/18/2017 21:17    Procedures Procedures (including critical care time)  Medications Ordered in ED Medications  0.9 %  sodium chloride infusion ( Intravenous New Bag/Given 03/18/17 2125)  sodium chloride 0.9 % bolus 250 mL (250 mLs Intravenous New Bag/Given 03/18/17 2120)  ondansetron (ZOFRAN) injection 4 mg (4 mg Intravenous Given 03/18/17 2120)  HYDROmorphone (DILAUDID) injection 0.5 mg (0.5 mg Intravenous Given 03/18/17 2120)     Initial Impression / Assessment and Plan / ED Course  I have reviewed the triage vital signs and the nursing notes.  Pertinent labs & imaging results that were available during my care of the patient were reviewed by me and considered in my medical decision making (see chart for details).     Patient arrived appearing very uncomfortable.  Tender to the epigastric area.  Heart rate on cardiac monitor did go down below 100.  Patient treated for pain also given a dose of antinausea medicine patient was on a course of antibiotics a week ago for a dental procedure.  Is been off antibiotics for a week.  Is possible that with the watery diarrhea this could be C. difficile.  Also with the epigastric abdominal pain cardiac etiology is always a possibility.  Troponin is pending.  EKG showed a sinus tachycardia with some questionable changes in V1.  Clinically though with all the tenderness in the epigastric area I doubt that this is cardiac in nature seems to be more GI.  Particularly with the diarrhea.  Patient's chest x-ray raises concerns for some pulmonary edema.  Patient's CBC shows a marked leukocytosis.  CT scan of the abdomen is pending.  Complete metabolic panel is also pending.  Final Clinical Impressions(s) / ED Diagnoses   Final diagnoses:  Epigastric pain  Diarrhea,  unspecified type  Acute pulmonary edema [facility])    ED Discharge Orders    None       Fredia Sorrow, MD 03/18/17 2143  Patient's electrolytes without significant abnormal troponin slightly elevated at 0.04.  CT scan of the abdomen is critical in this patient to determine what is going on.  Patient most likely is going to require admission.  Repeat troponin has been ordered 3 hours from the first troponin.    Fredia Sorrow, MD 03/18/17 2200

## 2017-03-18 NOTE — ED Notes (Signed)
Took blood work to lab.

## 2017-03-19 ENCOUNTER — Inpatient Hospital Stay (HOSPITAL_COMMUNITY): Payer: Medicare HMO

## 2017-03-19 ENCOUNTER — Encounter (HOSPITAL_COMMUNITY): Payer: Self-pay | Admitting: Family Medicine

## 2017-03-19 ENCOUNTER — Other Ambulatory Visit: Payer: Self-pay

## 2017-03-19 DIAGNOSIS — Z853 Personal history of malignant neoplasm of breast: Secondary | ICD-10-CM | POA: Diagnosis not present

## 2017-03-19 DIAGNOSIS — I509 Heart failure, unspecified: Secondary | ICD-10-CM

## 2017-03-19 DIAGNOSIS — R262 Difficulty in walking, not elsewhere classified: Secondary | ICD-10-CM | POA: Diagnosis not present

## 2017-03-19 DIAGNOSIS — G8929 Other chronic pain: Secondary | ICD-10-CM | POA: Diagnosis not present

## 2017-03-19 DIAGNOSIS — I9589 Other hypotension: Secondary | ICD-10-CM | POA: Diagnosis not present

## 2017-03-19 DIAGNOSIS — M549 Dorsalgia, unspecified: Secondary | ICD-10-CM | POA: Diagnosis present

## 2017-03-19 DIAGNOSIS — M6281 Muscle weakness (generalized): Secondary | ICD-10-CM | POA: Diagnosis not present

## 2017-03-19 DIAGNOSIS — R778 Other specified abnormalities of plasma proteins: Secondary | ICD-10-CM | POA: Diagnosis present

## 2017-03-19 DIAGNOSIS — Z8701 Personal history of pneumonia (recurrent): Secondary | ICD-10-CM | POA: Diagnosis not present

## 2017-03-19 DIAGNOSIS — R0602 Shortness of breath: Secondary | ICD-10-CM | POA: Diagnosis not present

## 2017-03-19 DIAGNOSIS — Z86711 Personal history of pulmonary embolism: Secondary | ICD-10-CM | POA: Diagnosis not present

## 2017-03-19 DIAGNOSIS — R197 Diarrhea, unspecified: Secondary | ICD-10-CM | POA: Diagnosis present

## 2017-03-19 DIAGNOSIS — Z87891 Personal history of nicotine dependence: Secondary | ICD-10-CM | POA: Diagnosis not present

## 2017-03-19 DIAGNOSIS — R5081 Fever presenting with conditions classified elsewhere: Secondary | ICD-10-CM | POA: Diagnosis not present

## 2017-03-19 DIAGNOSIS — I361 Nonrheumatic tricuspid (valve) insufficiency: Secondary | ICD-10-CM | POA: Diagnosis not present

## 2017-03-19 DIAGNOSIS — J9601 Acute respiratory failure with hypoxia: Secondary | ICD-10-CM | POA: Diagnosis not present

## 2017-03-19 DIAGNOSIS — Z8679 Personal history of other diseases of the circulatory system: Secondary | ICD-10-CM | POA: Diagnosis not present

## 2017-03-19 DIAGNOSIS — J810 Acute pulmonary edema: Secondary | ICD-10-CM | POA: Diagnosis not present

## 2017-03-19 DIAGNOSIS — E785 Hyperlipidemia, unspecified: Secondary | ICD-10-CM | POA: Diagnosis not present

## 2017-03-19 DIAGNOSIS — R5381 Other malaise: Secondary | ICD-10-CM | POA: Diagnosis present

## 2017-03-19 DIAGNOSIS — F418 Other specified anxiety disorders: Secondary | ICD-10-CM | POA: Diagnosis present

## 2017-03-19 DIAGNOSIS — Z87442 Personal history of urinary calculi: Secondary | ICD-10-CM | POA: Diagnosis not present

## 2017-03-19 DIAGNOSIS — R1013 Epigastric pain: Secondary | ICD-10-CM | POA: Insufficient documentation

## 2017-03-19 DIAGNOSIS — I248 Other forms of acute ischemic heart disease: Secondary | ICD-10-CM | POA: Diagnosis not present

## 2017-03-19 DIAGNOSIS — G4700 Insomnia, unspecified: Secondary | ICD-10-CM | POA: Diagnosis not present

## 2017-03-19 DIAGNOSIS — I2699 Other pulmonary embolism without acute cor pulmonale: Secondary | ICD-10-CM | POA: Diagnosis present

## 2017-03-19 DIAGNOSIS — R42 Dizziness and giddiness: Secondary | ICD-10-CM | POA: Diagnosis not present

## 2017-03-19 DIAGNOSIS — K219 Gastro-esophageal reflux disease without esophagitis: Secondary | ICD-10-CM | POA: Diagnosis not present

## 2017-03-19 DIAGNOSIS — D72829 Elevated white blood cell count, unspecified: Secondary | ICD-10-CM | POA: Diagnosis not present

## 2017-03-19 DIAGNOSIS — Z8619 Personal history of other infectious and parasitic diseases: Secondary | ICD-10-CM | POA: Diagnosis not present

## 2017-03-19 DIAGNOSIS — J9600 Acute respiratory failure, unspecified whether with hypoxia or hypercapnia: Secondary | ICD-10-CM | POA: Diagnosis present

## 2017-03-19 DIAGNOSIS — R69 Illness, unspecified: Secondary | ICD-10-CM | POA: Diagnosis not present

## 2017-03-19 DIAGNOSIS — I471 Supraventricular tachycardia: Secondary | ICD-10-CM | POA: Diagnosis not present

## 2017-03-19 DIAGNOSIS — I10 Essential (primary) hypertension: Secondary | ICD-10-CM | POA: Diagnosis not present

## 2017-03-19 LAB — CBC WITH DIFFERENTIAL/PLATELET
Basophils Absolute: 0 10*3/uL (ref 0.0–0.1)
Basophils Relative: 0 %
Eosinophils Absolute: 0.1 10*3/uL (ref 0.0–0.7)
Eosinophils Relative: 0 %
HCT: 40.2 % (ref 36.0–46.0)
Hemoglobin: 12.9 g/dL (ref 12.0–15.0)
Lymphocytes Relative: 12 %
Lymphs Abs: 2.4 10*3/uL (ref 0.7–4.0)
MCH: 31.4 pg (ref 26.0–34.0)
MCHC: 32.1 g/dL (ref 30.0–36.0)
MCV: 97.8 fL (ref 78.0–100.0)
Monocytes Absolute: 1.4 10*3/uL — ABNORMAL HIGH (ref 0.1–1.0)
Monocytes Relative: 7 %
Neutro Abs: 15.4 10*3/uL — ABNORMAL HIGH (ref 1.7–7.7)
Neutrophils Relative %: 81 %
Platelets: 298 10*3/uL (ref 150–400)
RBC: 4.11 MIL/uL (ref 3.87–5.11)
RDW: 13.2 % (ref 11.5–15.5)
WBC: 19.3 10*3/uL — ABNORMAL HIGH (ref 4.0–10.5)

## 2017-03-19 LAB — URINALYSIS, ROUTINE W REFLEX MICROSCOPIC
Bilirubin Urine: NEGATIVE
Glucose, UA: NEGATIVE mg/dL
Ketones, ur: NEGATIVE mg/dL
Leukocytes, UA: NEGATIVE
Nitrite: NEGATIVE
Protein, ur: NEGATIVE mg/dL
Specific Gravity, Urine: 1.023 (ref 1.005–1.030)
pH: 6 (ref 5.0–8.0)

## 2017-03-19 LAB — ECHOCARDIOGRAM COMPLETE
Height: 67 in
Weight: 3079.39 oz

## 2017-03-19 LAB — TROPONIN I
Troponin I: 0.03 ng/mL (ref <0.03)
Troponin I: 0.03 ng/mL (ref <0.03)
Troponin I: 0.05 ng/mL (ref <0.03)

## 2017-03-19 LAB — HEPARIN LEVEL (UNFRACTIONATED): Heparin Unfractionated: >1.1 IU/mL — ABNORMAL HIGH (ref 0.30–0.70)

## 2017-03-19 LAB — MRSA PCR SCREENING: MRSA by PCR: NEGATIVE

## 2017-03-19 MED ORDER — LIVING BETTER WITH HEART FAILURE BOOK: Freq: Once | Status: DC

## 2017-03-19 MED ORDER — SODIUM CHLORIDE 0.9% FLUSH: 3.0000 mL | INTRAVENOUS | Status: DC | PRN

## 2017-03-19 MED ORDER — ATORVASTATIN CALCIUM 20 MG PO TABS
20.0000 mg | ORAL_TABLET | Freq: Every evening | ORAL | Status: DC
  Administered 2017-03-19 – 2017-03-24 (×6): 20 mg via ORAL
  Filled 2017-03-19 (×6): qty 1

## 2017-03-19 MED ORDER — FUROSEMIDE 10 MG/ML IJ SOLN
40.0000 mg | Freq: Once | INTRAMUSCULAR | Status: AC
  Administered 2017-03-19: 40 mg via INTRAVENOUS
  Filled 2017-03-19: qty 4

## 2017-03-19 MED ORDER — ACYCLOVIR 800 MG PO TABS
400.0000 mg | ORAL_TABLET | Freq: Two times a day (BID) | ORAL | Status: DC
  Administered 2017-03-19 – 2017-03-24 (×11): 400 mg via ORAL
  Filled 2017-03-19 (×11): qty 1

## 2017-03-19 MED ORDER — ALPRAZOLAM 0.25 MG PO TABS
0.2500 mg | ORAL_TABLET | Freq: Two times a day (BID) | ORAL | Status: DC | PRN
  Administered 2017-03-19 – 2017-03-21 (×2): 0.25 mg via ORAL
  Filled 2017-03-19 (×2): qty 1

## 2017-03-19 MED ORDER — HYDROMORPHONE HCL 1 MG/ML IJ SOLN
0.5000 mg | INTRAMUSCULAR | Status: DC | PRN
  Administered 2017-03-19 – 2017-03-21 (×8): 1 mg via INTRAVENOUS
  Administered 2017-03-23: 0.5 mg via INTRAVENOUS
  Administered 2017-03-23: 1 mg via INTRAVENOUS
  Filled 2017-03-19 (×10): qty 1

## 2017-03-19 MED ORDER — HEPARIN BOLUS VIA INFUSION
6000.0000 [IU] | Freq: Once | INTRAVENOUS | Status: AC
  Administered 2017-03-19: 6000 [IU] via INTRAVENOUS
  Filled 2017-03-19: qty 6000

## 2017-03-19 MED ORDER — MECLIZINE HCL 12.5 MG PO TABS
25.0000 mg | ORAL_TABLET | Freq: Three times a day (TID) | ORAL | Status: DC | PRN
  Administered 2017-03-21: 25 mg via ORAL
  Filled 2017-03-19: qty 2

## 2017-03-19 MED ORDER — ORAL CARE MOUTH RINSE
15.0000 mL | Freq: Two times a day (BID) | OROMUCOSAL | Status: DC
  Administered 2017-03-19 – 2017-03-24 (×5): 15 mL via OROMUCOSAL

## 2017-03-19 MED ORDER — BENAZEPRIL HCL 10 MG PO TABS
40.0000 mg | ORAL_TABLET | Freq: Every day | ORAL | Status: DC
  Administered 2017-03-19: 40 mg via ORAL
  Filled 2017-03-19: qty 4

## 2017-03-19 MED ORDER — TEMAZEPAM 15 MG PO CAPS
30.0000 mg | ORAL_CAPSULE | Freq: Every evening | ORAL | Status: DC | PRN
  Administered 2017-03-19 – 2017-03-23 (×5): 30 mg via ORAL
  Filled 2017-03-19 (×5): qty 2

## 2017-03-19 MED ORDER — POTASSIUM CHLORIDE CRYS ER 10 MEQ PO TBCR
10.0000 meq | EXTENDED_RELEASE_TABLET | Freq: Two times a day (BID) | ORAL | Status: DC
  Administered 2017-03-19 – 2017-03-24 (×11): 10 meq via ORAL
  Filled 2017-03-19 (×18): qty 1

## 2017-03-19 MED ORDER — SODIUM CHLORIDE 0.9 % IV SOLN: 250.0000 mL | INTRAVENOUS | Status: DC | PRN

## 2017-03-19 MED ORDER — ACETAMINOPHEN 325 MG PO TABS
650.0000 mg | ORAL_TABLET | ORAL | Status: DC | PRN
  Administered 2017-03-22: 650 mg via ORAL
  Filled 2017-03-19: qty 2

## 2017-03-19 MED ORDER — ENOXAPARIN SODIUM 40 MG/0.4ML ~~LOC~~ SOLN
40.0000 mg | SUBCUTANEOUS | Status: DC
  Administered 2017-03-19: 40 mg via SUBCUTANEOUS
  Filled 2017-03-19: qty 0.4

## 2017-03-19 MED ORDER — SODIUM CHLORIDE 0.9 % IV SOLN
INTRAVENOUS | Status: DC
  Administered 2017-03-19 – 2017-03-20 (×3): via INTRAVENOUS
  Administered 2017-03-20: 1 mL via INTRAVENOUS
  Administered 2017-03-21 – 2017-03-22 (×3): via INTRAVENOUS

## 2017-03-19 MED ORDER — ASPIRIN EC 81 MG PO TBEC
81.0000 mg | DELAYED_RELEASE_TABLET | Freq: Every day | ORAL | Status: DC
  Administered 2017-03-19 – 2017-03-21 (×3): 81 mg via ORAL
  Filled 2017-03-19 (×3): qty 1

## 2017-03-19 MED ORDER — IOPAMIDOL (ISOVUE-370) INJECTION 76%
100.0000 mL | Freq: Once | INTRAVENOUS | Status: AC | PRN
Start: 2017-03-19 — End: 2017-03-19
  Administered 2017-03-19: 80 mL via INTRAVENOUS

## 2017-03-19 MED ORDER — HEPARIN (PORCINE) IN NACL 100-0.45 UNIT/ML-% IJ SOLN
1300.0000 [IU]/h | INTRAMUSCULAR | Status: DC
  Administered 2017-03-19: 1450 [IU]/h via INTRAVENOUS
  Administered 2017-03-20: 1300 [IU]/h via INTRAVENOUS
  Administered 2017-03-20 – 2017-03-21 (×2): 1200 [IU]/h via INTRAVENOUS
  Filled 2017-03-19 (×4): qty 250

## 2017-03-19 MED ORDER — AMLODIPINE BESYLATE 5 MG PO TABS
5.0000 mg | ORAL_TABLET | Freq: Every day | ORAL | Status: DC
  Administered 2017-03-19: 5 mg via ORAL
  Filled 2017-03-19: qty 1

## 2017-03-19 MED ORDER — FLUOXETINE HCL 10 MG PO CAPS
10.0000 mg | ORAL_CAPSULE | Freq: Every morning | ORAL | Status: DC
  Administered 2017-03-19 – 2017-03-24 (×6): 10 mg via ORAL
  Filled 2017-03-19 (×6): qty 1

## 2017-03-19 MED ORDER — FUROSEMIDE 10 MG/ML IJ SOLN
40.0000 mg | Freq: Two times a day (BID) | INTRAMUSCULAR | Status: DC
  Administered 2017-03-19: 40 mg via INTRAVENOUS
  Filled 2017-03-19: qty 4

## 2017-03-19 MED ORDER — SODIUM CHLORIDE 0.9% FLUSH
3.0000 mL | Freq: Two times a day (BID) | INTRAVENOUS | Status: DC
  Administered 2017-03-19 – 2017-03-24 (×10): 3 mL via INTRAVENOUS

## 2017-03-19 MED ORDER — ONDANSETRON HCL 4 MG/2ML IJ SOLN
4.0000 mg | Freq: Four times a day (QID) | INTRAMUSCULAR | Status: DC | PRN
  Administered 2017-03-19 – 2017-03-21 (×4): 4 mg via INTRAVENOUS
  Filled 2017-03-19 (×4): qty 2

## 2017-03-19 NOTE — ED Notes (Signed)
Date and time results received: 03/19/17 0001 (use smartphrase ".now" to insert current time)  Test: troponin Critical Value: 0.05  Name of Provider Notified: Dr Netta Neat  Orders Received? Or Actions Taken?: Actions Taken: no orders received

## 2017-03-19 NOTE — Progress Notes (Signed)
Pt complaining of chest pain 10/10. Vital signs are as follows   03/19/17 0907  Vitals  Temp 97.7 F (36.5 C)  Temp Source Oral  BP 121/84  BP Location Right Arm  BP Method Automatic  Patient Position (if appropriate) Lying  Pulse Rate (!) 120  Pulse Rate Source Dinamap  ECG Heart Rate (!) 120  Resp 20  Oxygen Therapy  SpO2 94 %  Pain Assessment  Pain Assessment 0-10  Pain Score 10  Pain Type Acute pain  Pain Location Abdomen  Pain Orientation Left;Right  Pain Descriptors / Indicators Stabbing  Pain Frequency Constant  Pain Onset On-going  Patients Stated Pain Goal 0  Pain Intervention(s) MD notified (Comment)   MD verbally informed and is in room with patient and her family. Will continue to monitor patient.

## 2017-03-19 NOTE — Plan of Care (Signed)
Pt was complaining of significant pain. See updated orders per Dr. Myna Hidalgo. Patient was able to get comfortable and is now asleep. Family at the bedside. Reviewed plan of care  and educated on congestive heart failure pathway with patient and family and both verbalize understanding.

## 2017-03-19 NOTE — Progress Notes (Signed)
ANTICOAGULATION CONSULT NOTE - Initial Consult  Pharmacy Consult for HEPARIN Indication: pulmonary embolus  Allergies  Allergen Reactions  . Meclizine Diarrhea  . Penicillins Rash and Other (See Comments)    Has patient had a PCN reaction causing immediate rash, facial/tongue/throat swelling, SOB or lightheadedness with hypotension: Yes Has patient had a PCN reaction causing severe rash involving mucus membranes or skin necrosis: No Has patient had a PCN reaction that required hospitalization No Has patient had a PCN reaction occurring within the last 10 years: No If all of the above answers are "NO", then may proceed with Cephalosporin use.    Patient Measurements: Height: 5\' 7"  (170.2 cm) Weight: 192 lb 7.4 oz (87.3 kg) IBW/kg (Calculated) : 61.6 HEPARIN DW (KG): 80.1  Vital Signs: Temp: 97.7 F (36.5 C) (01/25 0907) Temp Source: Oral (01/25 0907) BP: 121/84 (01/25 0907) Pulse Rate: 120 (01/25 0907)  Labs: Recent Labs    03/18/17 2050 03/18/17 2346 03/19/17 0506 03/19/17 0857  HGB 14.9  --  12.9  --   HCT 44.9  --  40.2  --   PLT 287  --  298  --   CREATININE 0.88  --   --   --   TROPONINI 0.04* 0.05* 0.03* 0.03*   Estimated Creatinine Clearance: 63.7 mL/min (by C-G formula based on SCr of 0.88 mg/dL).  Medical History: Past Medical History:  Diagnosis Date  . Bronchitis   . Cancer ([place])   . Chronic back pain   . Depression   . GERD (gastroesophageal reflux disease)   . Hyperlipidemia   . Hypertension   . Insomnia   . Kidney stone   . Pneumonia   . Vertigo    Medications:  Medications Prior to Admission  Medication Sig Dispense Refill Last Dose  . acetaminophen (TYLENOL) 500 MG tablet Take 1,000 mg by mouth every 6 (six) hours as needed.   Taking  . acyclovir (ZOVIRAX) 400 MG tablet TAKE 1 TABLET BY MOUTH TWICE DAILY 180 tablet 1 Taking  . amLODipine-benazepril (LOTREL) 5-40 MG capsule TAKE 1 CAPSULE DAILY 90 capsule 1   . aspirin 81 MG tablet Take  81 mg by mouth daily. Does not take every day   Taking  . atorvastatin (LIPITOR) 20 MG tablet TAKE 1 TABLET EVERY EVENING 90 tablet 1   . clotrimazole-betamethasone (LOTRISONE) cream Apply 1 application topically 2 (two) times daily. For 10 days (Patient taking differently: Apply 1 application topically 2 (two) times daily as needed. ) 30 g 0 Taking  . Fluoxetine HCl, PMDD, 10 MG TABS One tablet daily 30 tablet 3 Taking  . furosemide (LASIX) 20 MG tablet One tablet twice weekly as needed for ankle swelling 30 tablet 0 Taking  . meclizine (ANTIVERT) 25 MG tablet Take 1 tablet (25 mg total) by mouth 3 (three) times daily as needed for dizziness. 30 tablet 1   . potassium chloride (KLOR-CON 10) 10 MEQ tablet One tablet twice weekly onlyon the days you take the lasix ( fluid pill) 60 tablet 0 Taking  . temazepam (RESTORIL) 30 MG capsule TAKE 1 CAPSULE BY MOUTH AT BEDTIME AS NEEDED 90 capsule 1   . vitamin C (ASCORBIC ACID) 500 MG tablet Take 500 mg by mouth daily.   Taking   Assessment: 77yo female with new PE.  Pharmacy asked to initiate Heparin .  Goal of Therapy:  Heparin level 0.3-0.7 units/ml Monitor platelets by anticoagulation protocol: Yes   Plan:   Heparin 6000 units IV now x  1  Heparin infusion at 1450 units/hr  Heparin level in 8 hrs then daily  CBC daily while on Heparin   [place] Crane, Sissy Goetzke A 03/19/2017,11:53 AM

## 2017-03-19 NOTE — Progress Notes (Signed)
   Patient seen and evaluated, chart reviewed, please see EMR for updated orders. Please see full H&P dictated by admitting physician Dr Myna Hidalgo  for same date of service.   On exam patient is tachycardic, tachypneic, hypoxic (not previously on home O2) with complaint of pleuritic chest pain-worsening leukocytosis but no fevers  CTA  Chest consistent with acute bilateral PE-patient transferred to ICU on IV heparin drip, echocardiogram with EF of 55-60% no definite significant right heart strain at this time  Continue to monitor closely, will transfer to [facility] if more concerns about possible hemodynamic instability  Plan of care discussed with patient and her sister at bedside    Patient seen and evaluated, chart reviewed, please see EMR for updated orders. Please see full H&P dictated by admitting physician Dr Myna Hidalgo  for same date of service

## 2017-03-19 NOTE — Progress Notes (Signed)
[facility] for HEPARIN Indication: pulmonary embolus  Allergies  Allergen Reactions  . Meclizine Diarrhea  . Penicillins Rash and Other (See Comments)    Has patient had a PCN reaction causing immediate rash, facial/tongue/throat swelling, SOB or lightheadedness with hypotension: Yes Has patient had a PCN reaction causing severe rash involving mucus membranes or skin necrosis: No Has patient had a PCN reaction that required hospitalization No Has patient had a PCN reaction occurring within the last 10 years: No If all of the above answers are "NO", then may proceed with Cephalosporin use.    Patient Measurements: Height: 5\' 7"  (170.2 cm) Weight: 192 lb 7.4 oz (87.3 kg) IBW/kg (Calculated) : 61.6 HEPARIN DW (KG): 80.1  Vital Signs: Temp: 97.6 F (36.4 C) (01/25 2000) Temp Source: Oral (01/25 2000) BP: 121/81 (01/25 1700) Pulse Rate: 72 (01/25 1700)  Labs: Recent Labs    03/18/17 2050 03/18/17 2346 03/19/17 0506 03/19/17 0857 03/19/17 2040  HGB 14.9  --  12.9  --   --   HCT 44.9  --  40.2  --   --   PLT 287  --  298  --   --   HEPARINUNFRC  --   --   --   --  >1.10*  CREATININE 0.88  --   --   --   --   TROPONINI 0.04* 0.05* 0.03* 0.03*  --    Estimated Creatinine Clearance: 63.7 mL/min (by C-G formula based on SCr of 0.88 mg/dL).  Medical History: Past Medical History:  Diagnosis Date  . Bronchitis   . Cancer ([place])   . Chronic back pain   . Depression   . GERD (gastroesophageal reflux disease)   . Hyperlipidemia   . Hypertension   . Insomnia   . Kidney stone   . Pneumonia   . Vertigo    Medications:  Medications Prior to Admission  Medication Sig Dispense Refill Last Dose  . acetaminophen (TYLENOL) 500 MG tablet Take 1,000 mg by mouth every 6 (six) hours as needed for mild pain or moderate pain.    unknown  . acyclovir (ZOVIRAX) 400 MG tablet TAKE 1 TABLET BY MOUTH TWICE DAILY 180 tablet 1 unknown at Unknown time  .  aspirin 81 MG tablet Take 81 mg by mouth daily. Does not take every day   unknown at Unknown time  . Fluoxetine HCl, PMDD, 10 MG TABS One tablet daily 30 tablet 3 unknown at Unknown time  . furosemide (LASIX) 20 MG tablet One tablet twice weekly as needed for ankle swelling (Patient taking differently: Take 20 mg by mouth 2 (two) times a week. One tablet twice weekly as needed for ankle swelling) 30 tablet 0 unknown at Unknown time  . temazepam (RESTORIL) 30 MG capsule TAKE 1 CAPSULE BY MOUTH AT BEDTIME AS NEEDED 90 capsule 1 unknown at Unknown time  . vitamin C (ASCORBIC ACID) 500 MG tablet Take 500 mg by mouth daily.   unknown at Unknown time  . amLODipine-benazepril (LOTREL) 5-40 MG capsule TAKE 1 CAPSULE DAILY 90 capsule 1   . atorvastatin (LIPITOR) 20 MG tablet TAKE 1 TABLET EVERY EVENING 90 tablet 1   . clotrimazole-betamethasone (LOTRISONE) cream Apply 1 application topically 2 (two) times daily. For 10 days (Patient taking differently: Apply 1 application topically 2 (two) times daily as needed. ) 30 g 0 Taking  . HYDROcodone-acetaminophen (NORCO/VICODIN) 5-325 MG tablet      . meclizine (ANTIVERT) 25 MG tablet  Take 1 tablet (25 mg total) by mouth 3 (three) times daily as needed for dizziness. 30 tablet 1   . potassium chloride (KLOR-CON 10) 10 MEQ tablet One tablet twice weekly onlyon the days you take the lasix ( fluid pill) 60 tablet 0 Taking   Assessment: 77yo female with new PE.  Pharmacy asked to initiate Heparin . Heparin level is above goal.   Goal of Therapy:  Heparin level 0.3-0.7 units/ml Monitor platelets by anticoagulation protocol: Yes   Plan:   Decrease Heparin infusion to 1300 units/hr  Heparin level daily  CBC daily while on Heparin  [place] Crane, Karletta Millay A 03/19/2017,11:35 PM

## 2017-03-19 NOTE — H&P (Signed)
History and Physical    Theresa Garcia:[id number] DOB: 06/07/1940 DOA: 03/18/2017  PCP: Fayrene Helper, MD   Patient coming from: Home  Chief Complaint: SOB, upper abd pain   HPI: Theresa Garcia is a 77 y.o. female with medical history significant for hypertension and insomnia, now presenting to the emergency department for evaluation of upper abdominal discomfort shortness of breath.  Patient reports that she had a dental extraction performed approximately 1 week ago, was taking chronic analgesia and developed constipation, again taking laxatives loose stools after that.  She had otherwise been well until developing shortness of breath today.  She denies any significant cough and denies any fevers or chills.  She denies chest pain or palpitations reports a constant pain across the upper quadrants without alleviating or exacerbating factors identified.  She reports chronic bilateral lower extremity swelling, but denies lower extremity tenderness.  ED Course: Upon arrival to the ED, patient is found to be afebrile, saturating adequately on 2 L/min of supplemental oxygen, tachypneic, and with vitals otherwise normal.  EKG features and ectopic atrial tachycardia with rate 137.  Chest x-ray is consistent with acute CHF.  CT of the abdomen and pelvis is negative for obstruction or other acute intra-abdominal/pelvic pathology.  Chemistry panel is unremarkable and CBC is notable for a leukocytosis to 17,100.  Troponin is slightly elevated to 0.04 and BNP is elevated to 292.  Patient was treated with 250 cc normal saline, 40 mg IV Lasix, and sample was sent for C. difficile PCR.  Patient remains hemodynamically stable, in no acute respiratory distress, but continues to require supplemental oxygen while at rest and will be admitted to the telemetry unit for ongoing evaluation and management of shortness of breath suspected secondary to acute CHF.  Review of Systems:  All other systems reviewed  and apart from HPI, are negative.  Past Medical History:  Diagnosis Date  . Bronchitis   . Cancer ([place])   . Chronic back pain   . Depression   . GERD (gastroesophageal reflux disease)   . Hyperlipidemia   . Hypertension   . Insomnia   . Kidney stone   . Pneumonia   . Vertigo     Past Surgical History:  Procedure Laterality Date  . ABDOMINAL HYSTERECTOMY    . BREAST SURGERY     left  . CYSTOSCOPY/RETROGRADE/URETEROSCOPY/STONE EXTRACTION WITH BASKET  01/07/2011   Procedure: CYSTOSCOPY/RETROGRADE/URETEROSCOPY/STONE EXTRACTION WITH BASKET;  Surgeon: Marissa Nestle;  Location: [facility];  Service: Urology;  Laterality: Left;  Balloon Dilatation; stone given to family per MD  . TUBAL LIGATION       reports that she quit smoking about 36 years ago. Her smoking use included cigarettes. She has a 5.00 pack-year smoking history. she has never used smokeless tobacco. She reports that she does not drink alcohol or use drugs.  Allergies  Allergen Reactions  . Meclizine Diarrhea  . Penicillins Rash and Other (See Comments)    Has patient had a PCN reaction causing immediate rash, facial/tongue/throat swelling, SOB or lightheadedness with hypotension: Yes Has patient had a PCN reaction causing severe rash involving mucus membranes or skin necrosis: No Has patient had a PCN reaction that required hospitalization No Has patient had a PCN reaction occurring within the last 10 years: No If all of the above answers are "NO", then may proceed with Cephalosporin use.     Family History  Problem Relation Age of Onset  . COPD Father   . COPD  Sister   . COPD Sister   . Diabetes Sister   . Diabetes Brother   . Seizures Mother   . Bone cancer Brother   . Heart disease Brother   . Aneurysm Brother        brain  . HIV/AIDS Brother      Prior to Admission medications   Medication Sig Start Date End Date Taking? Authorizing Provider  acetaminophen (TYLENOL) 500 MG tablet Take 1,000 mg by  mouth every 6 (six) hours as needed.    [provider]  acyclovir (ZOVIRAX) 400 MG tablet TAKE 1 TABLET BY MOUTH TWICE DAILY 09/16/16   Fayrene Helper, MD  amLODipine-benazepril (LOTREL) 5-40 MG capsule TAKE 1 CAPSULE DAILY 02/10/17   Fayrene Helper, MD  aspirin 81 MG tablet Take 81 mg by mouth daily. Does not take every day    [provider]  atorvastatin (LIPITOR) 20 MG tablet TAKE 1 TABLET EVERY EVENING 03/17/17   Fayrene Helper, MD  clotrimazole-betamethasone (LOTRISONE) cream Apply 1 application topically 2 (two) times daily. For 10 days Patient taking differently: Apply 1 application topically 2 (two) times daily as needed.  03/14/14   Fayrene Helper, MD  Fluoxetine HCl, PMDD, 10 MG TABS One tablet daily 08/03/16   Fayrene Helper, MD  furosemide (LASIX) 20 MG tablet One tablet twice weekly as needed for ankle swelling 08/03/16   Fayrene Helper, MD  meclizine (ANTIVERT) 25 MG tablet Take 1 tablet (25 mg total) by mouth 3 (three) times daily as needed for dizziness. 11/30/16   Fayrene Helper, MD  potassium chloride (KLOR-CON 10) 10 MEQ tablet One tablet twice weekly onlyon the days you take the lasix ( fluid pill) 08/03/16   Fayrene Helper, MD  temazepam (RESTORIL) 30 MG capsule TAKE 1 CAPSULE BY MOUTH AT BEDTIME AS NEEDED 12/18/16   Fayrene Helper, MD  vitamin C (ASCORBIC ACID) 500 MG tablet Take 500 mg by mouth daily.    [provider]    Physical Exam: Vitals:   03/18/17 2034 03/18/17 2100 03/18/17 2200 03/18/17 2300  BP: (!) 142/90 138/85 137/89 131/81  Pulse: (!) 139 (!) 108 94 96  Resp: (!) 32 (!) 33 (!) 28   Temp: 97.7 F (36.5 C)     TempSrc:      SpO2: 97% 95% 96% 97%  Weight:      Height:          Constitutional: NAD, calm  Eyes: PERTLA, lids and conjunctivae normal ENMT: Mucous membranes are moist. Posterior pharynx clear of any exudate or lesions.   Neck: normal, supple, no masses, no  thyromegaly Respiratory: Rales bilaterally, no wheezing. No accessory muscle use.  Cardiovascular: S1 & S2 heard, regular rate and rhythm. 2+ pretibial pitting edema bilaterally. Abdomen: No distension, soft, mild tenderness in upper quadrants without rebound pain or guarding. Bowel sounds active.  Musculoskeletal: no clubbing / cyanosis. No joint deformity upper and lower extremities. Normal muscle tone.  Skin: no significant rashes, lesions, ulcers. Warm, dry, well-perfused. Neurologic: CN 2-12 grossly intact. Sensation intact. Strength 5/5 in all 4 limbs.  Psychiatric:  Alert and oriented x 3. Calm, cooperative.     Labs on Admission: I have personally reviewed following labs and imaging studies  CBC: Recent Labs  Lab 03/18/17 2050  WBC 17.1*  NEUTROABS 11.9*  HGB 14.9  HCT 44.9  MCV 97.2  PLT 938   Basic Metabolic Panel: Recent Labs  Lab 03/18/17 2050  NA 141  K 3.6  CL 106  CO2 20*  GLUCOSE 148*  BUN 9  CREATININE 0.88  CALCIUM 9.8   GFR: Estimated Creatinine Clearance: 64.8 mL/min (by C-G formula based on SCr of 0.88 mg/dL). Liver Function Tests: Recent Labs  Lab 03/18/17 2050  AST 24  ALT 14  ALKPHOS 66  BILITOT 0.8  PROT 8.2*  ALBUMIN 3.9   Recent Labs  Lab 03/18/17 2050  LIPASE 28   No results for input(s): AMMONIA in the last 168 hours. Coagulation Profile: No results for input(s): INR, PROTIME in the last 168 hours. Cardiac Enzymes: Recent Labs  Lab 03/18/17 2050  TROPONINI 0.04*   BNP (last 3 results) No results for input(s): PROBNP in the last 8760 hours. HbA1C: No results for input(s): HGBA1C in the last 72 hours. CBG: No results for input(s): GLUCAP in the last 168 hours. Lipid Profile: No results for input(s): CHOL, HDL, LDLCALC, TRIG, CHOLHDL, LDLDIRECT in the last 72 hours. Thyroid Function Tests: No results for input(s): TSH, T4TOTAL, FREET4, T3FREE, THYROIDAB in the last 72 hours. Anemia Panel: No results for input(s):  VITAMINB12, FOLATE, FERRITIN, TIBC, IRON, RETICCTPCT in the last 72 hours. Urine analysis:    Component Value Date/Time   COLORURINE YELLOW 05/06/2011 1730   APPEARANCEUR CLEAR 05/06/2011 1730   LABSPEC 1.010 05/06/2011 1730   PHURINE 5.5 05/06/2011 1730   GLUCOSEU NEGATIVE 05/06/2011 1730   HGBUR TRACE (A) 05/06/2011 1730   HGBUR trace-intact 04/10/2010 1331   BILIRUBINUR negative 12/30/2015 1554   KETONESUR NEGATIVE 05/06/2011 1730   PROTEINUR negative 12/30/2015 1554   PROTEINUR NEGATIVE 05/06/2011 1730   UROBILINOGEN 0.2 12/30/2015 1554   UROBILINOGEN 0.2 05/06/2011 1730   NITRITE negative 12/30/2015 1554   NITRITE NEGATIVE 05/06/2011 1730   LEUKOCYTESUR Negative 12/30/2015 1554   Sepsis Labs: @LABRCNTIP (procalcitonin:4,lacticidven:4) )No results found for this or any previous visit (from the past 240 hour(s)).   Radiological Exams on Admission: Ct Abdomen Pelvis W Contrast  Result Date: 03/18/2017 CLINICAL DATA:  Abdominal discomfort, lower chest pain, watery diarrhea EXAM: CT ABDOMEN AND PELVIS WITH CONTRAST TECHNIQUE: Multidetector CT imaging of the abdomen and pelvis was performed using the standard protocol following bolus administration of intravenous contrast. CONTRAST:  118mL ISOVUE-300 IOPAMIDOL (ISOVUE-300) INJECTION 61% COMPARISON:  04/26/2015 FINDINGS: Lower chest: Lung bases demonstrate small right-sided pleural effusion with streaky atelectasis or infiltrate at the right base. Cardiomegaly. Hepatobiliary: Mild steatosis. No calcified gallstones or biliary dilatation. Gallbladder is contracted Pancreas: Unremarkable. No pancreatic ductal dilatation or surrounding inflammatory changes. Spleen: Normal in size without focal abnormality. Adrenals/Urinary Tract: Adrenal glands are within normal limits. No hydronephrosis. Cyst in the mid right kidney. Additional subcentimeter hypodensities too small to further characterize. The bladder is normal Stomach/Bowel: Stomach is within  normal limits. Appendix appears normal. No evidence of bowel wall thickening, distention, or inflammatory changes. Sigmoid colon scattered diverticula without acute inflammation Vascular/Lymphatic: Mild aortic atherosclerosis. No aneurysmal dilatation. No significantly enlarged lymph nodes Reproductive: Status post hysterectomy. No adnexal masses. Other: Negative for free air or free fluid. Musculoskeletal: Degenerative changes. No acute or suspicious lesion. IMPRESSION: 1. Negative for bowel obstruction or bowel wall thickening 2. Mild hepatic steatosis 3. Small right pleural effusion with streaky atelectasis or infiltrate at the right base. Electronically Signed   By: Donavan Foil M.D.   On: 03/18/2017 23:14   Dg Chest Port 1 View  Result Date: 03/18/2017 CLINICAL DATA:  Upper abdominal pain onset tonight with diarrhea. EXAM: PORTABLE CHEST 1 VIEW COMPARISON:  None. FINDINGS: Heart is enlarged. Aorta is nonaneurysmal. Atherosclerotic calcifications are seen at the aortic arch. Lung volumes are low. There is pulmonary vascular congestion with probable small left and tiny right effusions. Surgical clips project over the left axilla with small focus of air within the soft tissues of the left axilla possibly related to intervention or air trapped within skin fold. Small focus of soft tissue infection is not entirely excluded. No acute osseous abnormality. IMPRESSION: 1. Low lung volumes with cardiomegaly and CHF. Small left and tiny right effusions. 2. Small rounded focus of air in the soft tissues of the left axilla, nonspecific. Differential possibilities given above. Electronically Signed   By: Ashley Royalty M.D.   On: 03/18/2017 21:17    EKG: Independently reviewed. Ectopic atrial tachycardia (rate 137).   Assessment/Plan  1. Acute CHF  - Presents with SOB, requiring supplemental O2 in ED to maintain sats in 90's  - CXR suggests acute CHF  - She was in an atrial tachycardia with rate close to 140  initially, possibly contributing to acute CHF  - No echo report on file to further characterize  - Treated in ED with Lasix 40 mg IV  - Continue diuresis with Lasix 40 mg IV q12h, continue cardiac monitoring, SLIV, follow daily wt and I/O's, obtain echocardiogram    2. Hypertension  - BP at goal  - Continue benazepril and Norvasc    3. Loose stools   - Reports that she was constipated after taking Percocet following a dental extraction, took laxatives, then had loose stool  - Sample sent for C diff PCR given recent abx use, will follow, but more likely secondary to the laxative use    4. Elevated troponin  - Reports upper abdominal discomfort on admission  - Troponin slightly elevated, suspected secondary to acute CHF rather than ACS  - Continue cardiac monitoring, trend troponin measurements, check echo as above  - Continue Lipitor and benazepril    DVT prophylaxis: Lovenox  Code Status: Full  Family Communication: Sister updated at bedside Disposition Plan: Admit to telemetry Consults called: None Admission status: Inpatient    Vianne Bulls, MD [facility] Pager [phone]  If 7PM-7AM, please contact night-coverage [email or web address] Password [email or web address]  03/19/2017, 12:12 AM

## 2017-03-19 NOTE — Progress Notes (Signed)
Pt transferred to ICU 2. Report given to [facility]. All questions answered and no further questions at this time.

## 2017-03-19 NOTE — Progress Notes (Signed)
*  PRELIMINARY RESULTS* Echocardiogram 2D Echocardiogram has been performed.  Leavy Cella 03/19/2017, 3:30 PM

## 2017-03-20 ENCOUNTER — Inpatient Hospital Stay (HOSPITAL_COMMUNITY): Payer: Medicare HMO

## 2017-03-20 LAB — BASIC METABOLIC PANEL
Anion gap: 9 (ref 5–15)
BUN: 12 mg/dL (ref 6–20)
CO2: 24 mmol/L (ref 22–32)
Calcium: 8.5 mg/dL — ABNORMAL LOW (ref 8.9–10.3)
Chloride: 105 mmol/L (ref 101–111)
Creatinine, Ser: 0.79 mg/dL (ref 0.44–1.00)
GFR calc Af Amer: >60 mL/min (ref >60)
GFR calc non Af Amer: >60 mL/min (ref >60)
Glucose, Bld: 97 mg/dL (ref 65–99)
Potassium: 3.9 mmol/L (ref 3.5–5.1)
Sodium: 138 mmol/L (ref 135–145)

## 2017-03-20 LAB — CBC
HCT: 35.4 % — ABNORMAL LOW (ref 36.0–46.0)
Hemoglobin: 11.2 g/dL — ABNORMAL LOW (ref 12.0–15.0)
MCH: 31.2 pg (ref 26.0–34.0)
MCHC: 31.6 g/dL (ref 30.0–36.0)
MCV: 98.6 fL (ref 78.0–100.0)
Platelets: 301 10*3/uL (ref 150–400)
RBC: 3.59 MIL/uL — ABNORMAL LOW (ref 3.87–5.11)
RDW: 12.9 % (ref 11.5–15.5)
WBC: 13.9 10*3/uL — ABNORMAL HIGH (ref 4.0–10.5)

## 2017-03-20 LAB — HEPARIN LEVEL (UNFRACTIONATED)
Heparin Unfractionated: 0.82 IU/mL — ABNORMAL HIGH (ref 0.30–0.70)
Heparin Unfractionated: 0.61 IU/mL (ref 0.30–0.70)

## 2017-03-20 NOTE — Progress Notes (Signed)
[place] for HEPARIN Indication: pulmonary embolus  Allergies  Allergen Reactions  . Meclizine Diarrhea  . Penicillins Rash and Other (See Comments)    Has patient had a PCN reaction causing immediate rash, facial/tongue/throat swelling, SOB or lightheadedness with hypotension: Yes Has patient had a PCN reaction causing severe rash involving mucus membranes or skin necrosis: No Has patient had a PCN reaction that required hospitalization No Has patient had a PCN reaction occurring within the last 10 years: No If all of the above answers are "NO", then may proceed with Cephalosporin use.    Patient Measurements: Height: 5\' 7"  (170.2 cm) Weight: 194 lb 14.2 oz (88.4 kg) IBW/kg (Calculated) : 61.6 HEPARIN DW (KG): 80.1  Vital Signs: Temp: 98.6 F (37 C) (01/26 0400) Temp Source: Oral (01/26 0400) BP: 112/69 (01/26 0600) Pulse Rate: 86 (01/26 0600)  Labs: Recent Labs    03/18/17 2050 03/18/17 2346 03/19/17 0506 03/19/17 0857 03/19/17 2040 03/20/17 0427  HGB 14.9  --  12.9  --   --  11.2*  HCT 44.9  --  40.2  --   --  35.4*  PLT 287  --  298  --   --  301  HEPARINUNFRC  --   --   --   --  >1.10* 0.82*  CREATININE 0.88  --   --   --   --  0.79  TROPONINI 0.04* 0.05* 0.03* 0.03*  --   --    Estimated Creatinine Clearance: 70.4 mL/min (by C-G formula based on SCr of 0.79 mg/dL).  Medical History: Past Medical History:  Diagnosis Date  . Bronchitis   . Cancer ([place])   . Chronic back pain   . Depression   . GERD (gastroesophageal reflux disease)   . Hyperlipidemia   . Hypertension   . Insomnia   . Kidney stone   . Pneumonia   . Vertigo    Medications:  Medications Prior to Admission  Medication Sig Dispense Refill Last Dose  . acetaminophen (TYLENOL) 500 MG tablet Take 1,000 mg by mouth every 6 (six) hours as needed for mild pain or moderate pain.    unknown  . acyclovir (ZOVIRAX) 400 MG tablet TAKE 1 TABLET BY MOUTH TWICE  DAILY 180 tablet 1 unknown at Unknown time  . aspirin 81 MG tablet Take 81 mg by mouth daily. Does not take every day   unknown at Unknown time  . Fluoxetine HCl, PMDD, 10 MG TABS One tablet daily 30 tablet 3 unknown at Unknown time  . furosemide (LASIX) 20 MG tablet One tablet twice weekly as needed for ankle swelling (Patient taking differently: Take 20 mg by mouth 2 (two) times a week. One tablet twice weekly as needed for ankle swelling) 30 tablet 0 unknown at Unknown time  . temazepam (RESTORIL) 30 MG capsule TAKE 1 CAPSULE BY MOUTH AT BEDTIME AS NEEDED 90 capsule 1 unknown at Unknown time  . vitamin C (ASCORBIC ACID) 500 MG tablet Take 500 mg by mouth daily.   unknown at Unknown time  . amLODipine-benazepril (LOTREL) 5-40 MG capsule TAKE 1 CAPSULE DAILY 90 capsule 1   . atorvastatin (LIPITOR) 20 MG tablet TAKE 1 TABLET EVERY EVENING 90 tablet 1   . clotrimazole-betamethasone (LOTRISONE) cream Apply 1 application topically 2 (two) times daily. For 10 days (Patient taking differently: Apply 1 application topically 2 (two) times daily as needed. ) 30 g 0 Taking  . HYDROcodone-acetaminophen (NORCO/VICODIN) 5-325 MG tablet      .  meclizine (ANTIVERT) 25 MG tablet Take 1 tablet (25 mg total) by mouth 3 (three) times daily as needed for dizziness. 30 tablet 1   . potassium chloride (KLOR-CON 10) 10 MEQ tablet One tablet twice weekly onlyon the days you take the lasix ( fluid pill) 60 tablet 0 Taking   Assessment: 77yo female with new PE.  Pharmacy asked to initiate Heparin . Heparin level is above goal.   Goal of Therapy:  Heparin level 0.3-0.7 units/ml Monitor platelets by anticoagulation protocol: Yes   Plan:   Decrease Heparin infusion to 1200 units/hr  Heparin level in ~ 6 hours and daily  CBC daily while on Heparin  [place] Crane, [place] A 03/20/2017,7:44 AM

## 2017-03-20 NOTE — Progress Notes (Signed)
Patient Demographics:    Theresa Garcia, is a 77 y.o. female, DOB - 1940/07/31, NOB:[id number]  Admit date - 03/18/2017   Admitting Physician Vianne Bulls, MD  Outpatient Primary MD for the patient is Fayrene Helper, MD  LOS - 1   Chief Complaint  Patient presents with  . Abdominal Pain        Subjective:    Theresa Garcia today has no fevers, no emesis, , patient's brother is at bedside, questions answered, shortness of breath is better, chest pain is resolved  Assessment  & Plan :    Principal Problem:   Acute Bil pulmonary embolism (HCC) Active Problems:   Essential hypertension   Diarrhea   Acute CHF (congestive heart failure) (HCC)   Hypoxic Respiratory failure, acute (HCC)   1)Acute Bil PE- CTA  Chest consistent with acute bilateral PE, clinically much improved on IV heparin drip,  less shortness of breath , tachycardia and tachypnea has resolved, no further chest pains , echocardiogram with EF of 55-60% no definite significant right heart strain at this time, no definite "provoking" factor identified for patient's PE, patient will most likely need lifelong anticoagulation.  At this time patient is leaning to was Eliquis as her  brother already takes Eliquis. Lower extremity venous Dopplers pending   2)H/o HTN-blood pressure has been soft, hold amlodipine/benazepril  3)Dyslipidemia-continue aspirin and Lipitor  4)Acute Hypoxic Respiratory Failure-secondary to #1 above, should improve with treatment of #1 above,  5)HFpEF-EF 55-60% on echocardiogram from 03/19/2017, patient's respiratory and cardiovascular symptoms are more related to acute bilateral PE rather than CHF at this time.  Patient had no prior history of  CHF, hold off on Lasix at this time given soft blood pressures  6)Leukocytosis-suspect reactive leukocytosis secondary to acute bilateral PE rather than frank infection,  leukocytosis is improving, blood cultures negative so far, UA negative for infection, CT abdomen and pelvis and chest x-ray without definite infectious findings  7)Elevated Troponin-most likely secondary to #1 above, rather than frank ACS  8)Diarrhea-in the setting of laxative use after being constipated, doubt infectious etiology, no further diarrhea, okay to discontinue enteric precautions  Code Status : Full code  Disposition Plan  : home  DVT Prophylaxis  :  Iv Heparin    Lab Results  Component Value Date   PLT 301 03/20/2017    Inpatient Medications  Scheduled Meds: . acyclovir  400 mg Oral BID  . aspirin EC  81 mg Oral Daily  . atorvastatin  20 mg Oral QPM  . FLUoxetine  10 mg Oral q morning - 10a  . Living Better with Heart Failure Book   Does not apply Once  . mouth rinse  15 mL Mouth Rinse BID  . potassium chloride  10 mEq Oral BID  . sodium chloride flush  3 mL Intravenous Q12H   Continuous Infusions: . sodium chloride    . sodium chloride 150 mL/hr at 03/20/17 0300  . heparin 1,200 Units/hr (03/20/17 0807)   PRN Meds:.sodium chloride, acetaminophen, ALPRAZolam, HYDROmorphone (DILAUDID) injection, meclizine, ondansetron (ZOFRAN) IV, sodium chloride flush, temazepam   Anti-infectives (From admission, onward)   Start     Dose/Rate Route Frequency Ordered Stop   03/19/17 0015  acyclovir (ZOVIRAX) tablet 400  mg     400 mg Oral 2 times daily 03/19/17 0012         Objective:   Vitals:   03/20/17 0400 03/20/17 0500 03/20/17 0600 03/20/17 0700  BP: (!) 95/57 116/69 112/69 129/68  Pulse: 76 86 86 90  Resp: 16 17 (!) 21 (!) 24  Temp: 98.6 F (37 C)     TempSrc: Oral     SpO2: 98% 96% 98% 98%  Weight: 88.4 kg (194 lb 14.2 oz)     Height:       Wt Readings from Last 3 Encounters:  03/20/17 88.4 kg (194 lb 14.2 oz)  01/18/17 88 kg (194 lb)  11/30/16 87.3 kg (192 lb 8 oz)    Intake/Output Summary (Last 24 hours) at 03/20/2017 1127 Last data filed at  03/20/2017 0300 Gross per 24 hour  Intake 2320.15 ml  Output -  Net 2320.15 ml   Physical Exam  Gen:- Awake Alert, able to speak in short sentences HEENT:- [place].AT, No sclera icterus Nose- [place] 2 L/min Neck-Supple Neck,No JVD,.  Lungs-diminished in bases, no wheezing or rhonchi, CV- S1, S2 normal, tachycardia has resolved Abd-  +ve B.Sounds, Abd Soft, No tenderness, no CVA tenderness Extremity/Skin:- No  edema,    Psych-affect is appropriate Neuro-no new focal deficits, no tremors    Data Review:   Micro Results Recent Results (from the past 240 hour(s))  Culture, blood (Routine X 2) w Reflex to ID Panel     Status: None (Preliminary result)   Collection Time: 03/19/17  8:44 AM  Result Value Ref Range Status   Specimen Description BLOOD LEFT ARM  Final   Special Requests   Final    BOTTLES DRAWN AEROBIC AND ANAEROBIC Blood Culture adequate volume   Culture NO GROWTH < 24 HOURS  Final   Report Status PENDING  Incomplete  Culture, blood (Routine X 2) w Reflex to ID Panel     Status: None (Preliminary result)   Collection Time: 03/19/17  8:44 AM  Result Value Ref Range Status   Specimen Description BLOOD LEFT HAND  Final   Special Requests   Final    BOTTLES DRAWN AEROBIC AND ANAEROBIC Blood Culture adequate volume   Culture NO GROWTH < 24 HOURS  Final   Report Status PENDING  Incomplete  MRSA PCR Screening     Status: None   Collection Time: 03/19/17 12:45 PM  Result Value Ref Range Status   MRSA by PCR NEGATIVE NEGATIVE Final    Comment:        The GeneXpert MRSA Assay (FDA approved for NASAL specimens only), is one component of a comprehensive MRSA colonization surveillance program. It is not intended to diagnose MRSA infection nor to guide or monitor treatment for MRSA infections.     Radiology Reports Ct Angio Chest Pe W Or Wo Contrast  Result Date: 03/19/2017 CLINICAL DATA:  Shortness of breath. EXAM: CT ANGIOGRAPHY CHEST WITH CONTRAST TECHNIQUE:  Multidetector CT imaging of the chest was performed using the standard protocol during bolus administration of intravenous contrast. Multiplanar CT image reconstructions and MIPs were obtained to evaluate the vascular anatomy. CONTRAST:  67mL ISOVUE-370 IOPAMIDOL (ISOVUE-370) INJECTION 76% COMPARISON:  Chest radiograph 03/18/2017 FINDINGS: Cardiovascular: Bilateral pulmonary emboli. The most central pulmonary embolus is within the distal main right pulmonary artery, and extends to the bifurcation of the right lower lobar and right middle lobar pulmonary arteries. Small portion of the embolus is seen at the takeoff of the right upper  lobar pulmonary artery is well. The left-sided pulmonary emboli are within the left upper lobar pulmonary artery and segmental branches of the left lower lobar pulmonary artery. There is evidence of right heart strain with RV/LV ratio of 1.3. Mediastinum/Nodes: No enlarged mediastinal, hilar, or axillary lymph nodes. Thyroid gland, trachea, and esophagus demonstrate no significant findings. Lungs/Pleura: Right lower lobe atelectasis versus peribronchial airspace consolidation. Mild right middle lobe atelectasis. Small right pleural effusion. Minimal left lower lobe atelectasis. Upper Abdomen: No acute abnormality. Musculoskeletal: Postsurgical changes in the left chest wall/axilla. No evidence of fracture or significant osseous abnormality. Exaggeration of thoracic kyphosis with mild osteoarthritic changes. Review of the MIP images confirms the above findings. IMPRESSION: Bilateral pulmonary emboli. The most central pulmonary embolus is within the distal right main pulmonary artery and extends to the bifurcation of the right lower and right middle lobar pulmonary arteries, where it becomes nearly occlusive. The left-sided pulmonary emboli are within the left upper lobar pulmonary artery and segmental branches of the left lower lobar pulmonary artery. Evidence of right heart strain with  RV/LV ratio of 1.3 Right lower lobar atelectasis versus peribronchial airspace consolidation. Small right pleural effusion. Minimal left lower lobar atelectasis. These results were called by telephone at the time of interpretation on 03/19/2017 at 11:40 am to Dr. Roxan Hockey , who verbally acknowledged these results. Electronically Signed   By: Fidela Salisbury M.D.   On: 03/19/2017 11:43   Ct Abdomen Pelvis W Contrast  Result Date: 03/18/2017 CLINICAL DATA:  Abdominal discomfort, lower chest pain, watery diarrhea EXAM: CT ABDOMEN AND PELVIS WITH CONTRAST TECHNIQUE: Multidetector CT imaging of the abdomen and pelvis was performed using the standard protocol following bolus administration of intravenous contrast. CONTRAST:  129mL ISOVUE-300 IOPAMIDOL (ISOVUE-300) INJECTION 61% COMPARISON:  04/26/2015 FINDINGS: Lower chest: Lung bases demonstrate small right-sided pleural effusion with streaky atelectasis or infiltrate at the right base. Cardiomegaly. Hepatobiliary: Mild steatosis. No calcified gallstones or biliary dilatation. Gallbladder is contracted Pancreas: Unremarkable. No pancreatic ductal dilatation or surrounding inflammatory changes. Spleen: Normal in size without focal abnormality. Adrenals/Urinary Tract: Adrenal glands are within normal limits. No hydronephrosis. Cyst in the mid right kidney. Additional subcentimeter hypodensities too small to further characterize. The bladder is normal Stomach/Bowel: Stomach is within normal limits. Appendix appears normal. No evidence of bowel wall thickening, distention, or inflammatory changes. Sigmoid colon scattered diverticula without acute inflammation Vascular/Lymphatic: Mild aortic atherosclerosis. No aneurysmal dilatation. No significantly enlarged lymph nodes Reproductive: Status post hysterectomy. No adnexal masses. Other: Negative for free air or free fluid. Musculoskeletal: Degenerative changes. No acute or suspicious lesion. IMPRESSION: 1.  Negative for bowel obstruction or bowel wall thickening 2. Mild hepatic steatosis 3. Small right pleural effusion with streaky atelectasis or infiltrate at the right base. Electronically Signed   By: Donavan Foil M.D.   On: 03/18/2017 23:14   Dg Chest Port 1 View  Result Date: 03/18/2017 CLINICAL DATA:  Upper abdominal pain onset tonight with diarrhea. EXAM: PORTABLE CHEST 1 VIEW COMPARISON:  None. FINDINGS: Heart is enlarged. Aorta is nonaneurysmal. Atherosclerotic calcifications are seen at the aortic arch. Lung volumes are low. There is pulmonary vascular congestion with probable small left and tiny right effusions. Surgical clips project over the left axilla with small focus of air within the soft tissues of the left axilla possibly related to intervention or air trapped within skin fold. Small focus of soft tissue infection is not entirely excluded. No acute osseous abnormality. IMPRESSION: 1. Low lung volumes with cardiomegaly and CHF.  Small left and tiny right effusions. 2. Small rounded focus of air in the soft tissues of the left axilla, nonspecific. Differential possibilities given above. Electronically Signed   By: Ashley Royalty M.D.   On: 03/18/2017 21:17     CBC Recent Labs  Lab 03/18/17 2050 03/19/17 0506 03/20/17 0427  WBC 17.1* 19.3* 13.9*  HGB 14.9 12.9 11.2*  HCT 44.9 40.2 35.4*  PLT 287 298 301  MCV 97.2 97.8 98.6  MCH 32.3 31.4 31.2  MCHC 33.2 32.1 31.6  RDW 13.2 13.2 12.9  LYMPHSABS 3.4 2.4  --   MONOABS 1.8* 1.4*  --   EOSABS 0.0 0.1  --   BASOSABS 0.0 0.0  --     Chemistries  Recent Labs  Lab 03/18/17 2050 03/20/17 0427  NA 141 138  K 3.6 3.9  CL 106 105  CO2 20* 24  GLUCOSE 148* 97  BUN 9 12  CREATININE 0.88 0.79  CALCIUM 9.8 8.5*  AST 24  --   ALT 14  --   ALKPHOS 66  --   BILITOT 0.8  --    ------------------------------------------------------------------------------------------------------------------ No results for input(s): CHOL, HDL,  LDLCALC, TRIG, CHOLHDL, LDLDIRECT in the last 72 hours.  No results found for: HGBA1C ------------------------------------------------------------------------------------------------------------------ No results for input(s): TSH, T4TOTAL, T3FREE, THYROIDAB in the last 72 hours.  Invalid input(s): FREET3 ------------------------------------------------------------------------------------------------------------------ No results for input(s): VITAMINB12, FOLATE, FERRITIN, TIBC, IRON, RETICCTPCT in the last 72 hours.  Coagulation profile No results for input(s): INR, PROTIME in the last 168 hours.  No results for input(s): DDIMER in the last 72 hours.  Cardiac Enzymes Recent Labs  Lab 03/18/17 2346 03/19/17 0506 03/19/17 0857  TROPONINI 0.05* 0.03* 0.03*   ------------------------------------------------------------------------------------------------------------------    Component Value Date/Time   BNP 292.0 (H) 03/18/2017 2136     Roxan Hockey M.D on 03/20/2017 at 11:27 AM  Between 7am to 7pm - Pager - [phone]  After 7pm go to [email or web address] - password [email or web address]  [facility] -  Office  [phone]   Voice Recognition Viviann Spare dictation system was used to create this note, attempts have been made to correct errors. Please contact the author with questions and/or clarifications.

## 2017-03-20 NOTE — Progress Notes (Addendum)
[facility] for [facility] Indication: pulmonary embolus  Allergies  Allergen Reactions  . Meclizine Diarrhea  . Penicillins Rash and Other (See Comments)    Has patient had a PCN reaction causing immediate rash, facial/tongue/throat swelling, SOB or lightheadedness with hypotension: Yes Has patient had a PCN reaction causing severe rash involving mucus membranes or skin necrosis: No Has patient had a PCN reaction that required hospitalization No Has patient had a PCN reaction occurring within the last 10 years: No If all of the above answers are "NO", then may proceed with Cephalosporin use.    Patient Measurements: Height: 5\' 7"  (170.2 cm) Weight: 194 lb 14.2 oz (88.4 kg) IBW/kg (Calculated) : 61.6 HEPARIN DW (KG): 80.1  Vital Signs: Temp: 99.8 F (37.7 C) (01/26 1156) Temp Source: Oral (01/26 1156) BP: 129/68 (01/26 0700) Pulse Rate: 90 (01/26 0700)  Labs: Recent Labs    03/18/17 2050 03/18/17 2346 03/19/17 0506 03/19/17 0857 03/19/17 2040 03/20/17 0427 03/20/17 1346  HGB 14.9  --  12.9  --   --  11.2*  --   HCT 44.9  --  40.2  --   --  35.4*  --   PLT 287  --  298  --   --  301  --   HEPARINUNFRC  --   --   --   --  >1.10* 0.82* 0.61  CREATININE 0.88  --   --   --   --  0.79  --   TROPONINI 0.04* 0.05* 0.03* 0.03*  --   --   --    Estimated Creatinine Clearance: 70.4 mL/min (by C-G formula based on SCr of 0.79 mg/dL).  Medical History: Past Medical History:  Diagnosis Date  . Bronchitis   . Cancer ([place])   . Chronic back pain   . Depression   . GERD (gastroesophageal reflux disease)   . Hyperlipidemia   . Hypertension   . Insomnia   . Kidney stone   . Pneumonia   . Vertigo    Medications:  Medications Prior to Admission  Medication Sig Dispense Refill Last Dose  . acetaminophen (TYLENOL) 500 MG tablet Take 1,000 mg by mouth every 6 (six) hours as needed for mild pain or moderate pain.    unknown at Unknown time  .  acyclovir (ZOVIRAX) 400 MG tablet TAKE 1 TABLET BY MOUTH TWICE DAILY 180 tablet 1 Past Week at Unknown time  . amLODipine-benazepril (LOTREL) 5-40 MG capsule TAKE 1 CAPSULE DAILY 90 capsule 1 Past Week at Unknown time  . aspirin 81 MG tablet Take 81 mg by mouth daily. Does not take every day   Past Week at Unknown time  . atorvastatin (LIPITOR) 20 MG tablet TAKE 1 TABLET EVERY EVENING 90 tablet 1 Past Week at Unknown time  . Fluoxetine HCl, PMDD, 10 MG TABS One tablet daily (Patient taking differently: Take 1 tablet by mouth daily. One tablet daily) 30 tablet 3 Past Week at Unknown time  . furosemide (LASIX) 20 MG tablet One tablet twice weekly as needed for ankle swelling (Patient taking differently: Take 20 mg by mouth 2 (two) times a week. One tablet twice weekly as needed for ankle swelling) 30 tablet 0 Past Week at Unknown time  . potassium chloride (KLOR-CON 10) 10 MEQ tablet One tablet twice weekly onlyon the days you take the lasix ( fluid pill) 60 tablet 0 Past Week at Unknown time  . temazepam (RESTORIL) 30 MG capsule TAKE 1 CAPSULE  BY MOUTH AT BEDTIME AS NEEDED 90 capsule 1 Past Week at Unknown time  . vitamin C (ASCORBIC ACID) 500 MG tablet Take 500 mg by mouth daily.   Past Week at Unknown time   Assessment: 77yo female with new PE.  Pharmacy asked to initiate Heparin . Heparin level is now at goal.    Goal of Therapy:  Heparin level 0.3-0.7 units/ml Monitor platelets by anticoagulation protocol: Yes   Plan:   Continue Heparin infusion at 1200 units/hr  Heparin level daily  CBC daily while on Heparin  [place] Crane, Olvin Rohr A 03/20/2017,2:40 PM

## 2017-03-21 LAB — BASIC METABOLIC PANEL
Anion gap: 10 (ref 5–15)
BUN: 8 mg/dL (ref 6–20)
CO2: 23 mmol/L (ref 22–32)
Calcium: 8.9 mg/dL (ref 8.9–10.3)
Chloride: 110 mmol/L (ref 101–111)
Creatinine, Ser: 0.68 mg/dL (ref 0.44–1.00)
GFR calc Af Amer: >60 mL/min (ref >60)
GFR calc non Af Amer: >60 mL/min (ref >60)
Glucose, Bld: 101 mg/dL — ABNORMAL HIGH (ref 65–99)
Potassium: 4.1 mmol/L (ref 3.5–5.1)
Sodium: 143 mmol/L (ref 135–145)

## 2017-03-21 LAB — CBC
HCT: 33.5 % — ABNORMAL LOW (ref 36.0–46.0)
Hemoglobin: 10.5 g/dL — ABNORMAL LOW (ref 12.0–15.0)
MCH: 31.3 pg (ref 26.0–34.0)
MCHC: 31.3 g/dL (ref 30.0–36.0)
MCV: 99.7 fL (ref 78.0–100.0)
Platelets: 347 10*3/uL (ref 150–400)
RBC: 3.36 MIL/uL — ABNORMAL LOW (ref 3.87–5.11)
RDW: 13.4 % (ref 11.5–15.5)
WBC: 11.3 10*3/uL — ABNORMAL HIGH (ref 4.0–10.5)

## 2017-03-21 LAB — HEPARIN LEVEL (UNFRACTIONATED): Heparin Unfractionated: 0.48 IU/mL (ref 0.30–0.70)

## 2017-03-21 NOTE — Progress Notes (Signed)
Patient Demographics:    Theresa Garcia, is a 77 y.o. female, DOB - 1940-11-20, EVO:[id number]  Admit date - 03/18/2017   Admitting Physician Vianne Bulls, MD  Outpatient Primary MD for the patient is Fayrene Helper, MD  LOS - 2   Chief Complaint  Patient presents with  . Abdominal Pain        Subjective:    Theresa Garcia today has no fevers, no emesis, , patient's younger brother is at bedside, questions answered, had some blood tinged nasal drainage (will add humidifier to oxygen via Nasal canula), no further chest pains, no further shortness of breath at rest, eating and drinking well, no BM  Assessment  & Plan :    Principal Problem:   Acute Bil pulmonary embolism (HCC) Active Problems:   Essential hypertension   Diarrhea   Acute CHF (congestive heart failure) (HCC)   Hypoxic Respiratory failure, acute (HCC)   1)Acute Bil PE- CTA Chest consistent with acute Extensive bilateral PE, clinically continues to improve, no further tachypnea, no further tachycardia, no further chest pains, hypoxia is improving, c/n IV heparin drip,  echocardiogram with EF of 55-60% no definite significant right heart strain at this time, no definite "provoking" factor identified for patient's PE, no need for hyper coagulability workup as patient will most likely need lifelong anticoagulation.  No prior personal history of DVT, however patient has family history of DVT/PE in multiple first-degree relatives, Lower extremity venous Dopplers without acute DVT, suspect DVT of pelvic/abdominal veins.  Patient with history of breast cancer,  advised to continue surveillance with oncologist, the patient apparently has been getting a colonoscopy at advised intervals.  No evidence of underlying malignancy.  Plan is to stop IV heparin on 03/22/2017 and start p.o. Eliquis (pt prefers Eliquis as her brother is already taking this  drug).   2)H/o HTN- Pulm Embolism associated hypotension has improved, continue to hold  amlodipine/benazepril , may restart upon discharge if BP is elevated   3)Dyslipidemia-continue Lipitor , given that the patient is going on be on full anticoagulation okay to stop aspirin as patient has no history of CAD or strokes  4)Acute Hypoxic Respiratory Failure-secondary to #1 above, should improve with treatment of #1 above, hypoxia has improved but not resolved, will need to see if patient qualifies for home O2 upon discharge (Dx of CHF can be used for Home oxygen Rx)  5)HFpEF-no prior history of CHF, suspect CHF symptoms are associated with acute bilateral PE , EF 55-60% based on echocardiogram from 03/19/2017,   6)Leukocytosis-suspect reactive leukocytosis secondary to acute bilateral PE rather than frank infection, WBC is down to 11.3 from over 19,  blood cultures negative so far, UA negative for infection, CT abdomen and pelvis and chest x-ray without definite infectious findings  7)Elevated Troponin-most likely secondary to #1 above, rather than frank ACS  8)Diarrhea-in the setting of laxative use after being constipated, doubt infectious etiology, no further diarrhea, discontinue enteric precautions  Code Status : Full code  Disposition Plan  : home (may need home O2)  DVT Prophylaxis  :  Iv Heparin  (plan to transition to Eliquis)  Lab Results  Component Value Date   PLT 347 03/21/2017    Inpatient Medications  Scheduled  Meds: . acyclovir  400 mg Oral BID  . aspirin EC  81 mg Oral Daily  . atorvastatin  20 mg Oral QPM  . FLUoxetine  10 mg Oral q morning - 10a  . Living Better with Heart Failure Book   Does not apply Once  . mouth rinse  15 mL Mouth Rinse BID  . potassium chloride  10 mEq Oral BID  . sodium chloride flush  3 mL Intravenous Q12H   Continuous Infusions: . sodium chloride    . sodium chloride 150 mL/hr at 03/21/17 1204  . heparin 1,200 Units/hr (03/21/17 0600)    PRN Meds:.sodium chloride, acetaminophen, ALPRAZolam, HYDROmorphone (DILAUDID) injection, meclizine, ondansetron (ZOFRAN) IV, sodium chloride flush, temazepam   Anti-infectives (From admission, onward)   Start     Dose/Rate Route Frequency Ordered Stop   03/19/17 0015  acyclovir (ZOVIRAX) tablet 400 mg     400 mg Oral 2 times daily 03/19/17 0012         Objective:   Vitals:   03/21/17 0900 03/21/17 1000 03/21/17 1100 03/21/17 1200  BP: (!) 138/103  (!) 144/85 (!) 148/81  Pulse: 86 91 81 84  Resp: 20 (!) 23 (!) 21 (!) 25  Temp:      TempSrc:      SpO2: 98% 99% 98% 99%  Weight:      Height:       Wt Readings from Last 3 Encounters:  03/21/17 88.6 kg (195 lb 5.2 oz)  01/18/17 88 kg (194 lb)  11/30/16 87.3 kg (192 lb 8 oz)    Intake/Output Summary (Last 24 hours) at 03/21/2017 1241 Last data filed at 03/21/2017 1144 Gross per 24 hour  Intake 4434.5 ml  Output 3200 ml  Net 1234.5 ml   Physical Exam  Gen:- Awake Alert, able to speak in full sentences HEENT:- [place].AT, No sclera icterus Nose- [place] 2 L/min Neck-Supple Neck,No JVD,.  Lungs-improved air movement bilaterally, no wheezing or rhonchi, CV- S1, S2 normal, tachycardia has resolved Abd-  +ve B.Sounds, Abd Soft, No tenderness, no CVA tenderness Extremity/Skin:- No edema, neg Homan's Psych- affect is appropriate Neuro-no new focal deficits, no tremors   Data Review:   Micro Results Recent Results (from the past 240 hour(s))  Culture, blood (Routine X 2) w Reflex to ID Panel     Status: None (Preliminary result)   Collection Time: 03/19/17  8:44 AM  Result Value Ref Range Status   Specimen Description BLOOD LEFT ARM  Final   Special Requests   Final    BOTTLES DRAWN AEROBIC AND ANAEROBIC Blood Culture adequate volume   Culture NO GROWTH 2 DAYS  Final   Report Status PENDING  Incomplete  Culture, blood (Routine X 2) w Reflex to ID Panel     Status: None (Preliminary result)   Collection Time: 03/19/17  8:44 AM    Result Value Ref Range Status   Specimen Description BLOOD LEFT HAND  Final   Special Requests   Final    BOTTLES DRAWN AEROBIC AND ANAEROBIC Blood Culture adequate volume   Culture NO GROWTH 2 DAYS  Final   Report Status PENDING  Incomplete  MRSA PCR Screening     Status: None   Collection Time: 03/19/17 12:45 PM  Result Value Ref Range Status   MRSA by PCR NEGATIVE NEGATIVE Final    Comment:        The GeneXpert MRSA Assay (FDA approved for NASAL specimens only), is one component of a comprehensive MRSA  colonization surveillance program. It is not intended to diagnose MRSA infection nor to guide or monitor treatment for MRSA infections.     Radiology Reports Ct Angio Chest Pe W Or Wo Contrast  Result Date: 03/19/2017 CLINICAL DATA:  Shortness of breath. EXAM: CT ANGIOGRAPHY CHEST WITH CONTRAST TECHNIQUE: Multidetector CT imaging of the chest was performed using the standard protocol during bolus administration of intravenous contrast. Multiplanar CT image reconstructions and MIPs were obtained to evaluate the vascular anatomy. CONTRAST:  41mL ISOVUE-370 IOPAMIDOL (ISOVUE-370) INJECTION 76% COMPARISON:  Chest radiograph 03/18/2017 FINDINGS: Cardiovascular: Bilateral pulmonary emboli. The most central pulmonary embolus is within the distal main right pulmonary artery, and extends to the bifurcation of the right lower lobar and right middle lobar pulmonary arteries. Small portion of the embolus is seen at the takeoff of the right upper lobar pulmonary artery is well. The left-sided pulmonary emboli are within the left upper lobar pulmonary artery and segmental branches of the left lower lobar pulmonary artery. There is evidence of right heart strain with RV/LV ratio of 1.3. Mediastinum/Nodes: No enlarged mediastinal, hilar, or axillary lymph nodes. Thyroid gland, trachea, and esophagus demonstrate no significant findings. Lungs/Pleura: Right lower lobe atelectasis versus peribronchial  airspace consolidation. Mild right middle lobe atelectasis. Small right pleural effusion. Minimal left lower lobe atelectasis. Upper Abdomen: No acute abnormality. Musculoskeletal: Postsurgical changes in the left chest wall/axilla. No evidence of fracture or significant osseous abnormality. Exaggeration of thoracic kyphosis with mild osteoarthritic changes. Review of the MIP images confirms the above findings. IMPRESSION: Bilateral pulmonary emboli. The most central pulmonary embolus is within the distal right main pulmonary artery and extends to the bifurcation of the right lower and right middle lobar pulmonary arteries, where it becomes nearly occlusive. The left-sided pulmonary emboli are within the left upper lobar pulmonary artery and segmental branches of the left lower lobar pulmonary artery. Evidence of right heart strain with RV/LV ratio of 1.3 Right lower lobar atelectasis versus peribronchial airspace consolidation. Small right pleural effusion. Minimal left lower lobar atelectasis. These results were called by telephone at the time of interpretation on 03/19/2017 at 11:40 am to Dr. Roxan Hockey , who verbally acknowledged these results. Electronically Signed   By: Fidela Salisbury M.D.   On: 03/19/2017 11:43   Ct Abdomen Pelvis W Contrast  Result Date: 03/18/2017 CLINICAL DATA:  Abdominal discomfort, lower chest pain, watery diarrhea EXAM: CT ABDOMEN AND PELVIS WITH CONTRAST TECHNIQUE: Multidetector CT imaging of the abdomen and pelvis was performed using the standard protocol following bolus administration of intravenous contrast. CONTRAST:  172mL ISOVUE-300 IOPAMIDOL (ISOVUE-300) INJECTION 61% COMPARISON:  04/26/2015 FINDINGS: Lower chest: Lung bases demonstrate small right-sided pleural effusion with streaky atelectasis or infiltrate at the right base. Cardiomegaly. Hepatobiliary: Mild steatosis. No calcified gallstones or biliary dilatation. Gallbladder is contracted Pancreas:  Unremarkable. No pancreatic ductal dilatation or surrounding inflammatory changes. Spleen: Normal in size without focal abnormality. Adrenals/Urinary Tract: Adrenal glands are within normal limits. No hydronephrosis. Cyst in the mid right kidney. Additional subcentimeter hypodensities too small to further characterize. The bladder is normal Stomach/Bowel: Stomach is within normal limits. Appendix appears normal. No evidence of bowel wall thickening, distention, or inflammatory changes. Sigmoid colon scattered diverticula without acute inflammation Vascular/Lymphatic: Mild aortic atherosclerosis. No aneurysmal dilatation. No significantly enlarged lymph nodes Reproductive: Status post hysterectomy. No adnexal masses. Other: Negative for free air or free fluid. Musculoskeletal: Degenerative changes. No acute or suspicious lesion. IMPRESSION: 1. Negative for bowel obstruction or bowel wall thickening 2. Mild hepatic  steatosis 3. Small right pleural effusion with streaky atelectasis or infiltrate at the right base. Electronically Signed   By: Donavan Foil M.D.   On: 03/18/2017 23:14   US Venous Img Lower Bilateral  Result Date: 03/20/2017 CLINICAL DATA:  77 year old female with a history bilateral pulmonary embolism EXAM: BILATERAL LOWER EXTREMITY VENOUS DOPPLER ULTRASOUND TECHNIQUE: Gray-scale sonography with graded compression, as well as color Doppler and duplex ultrasound were performed to evaluate the lower extremity deep venous systems from the level of the common femoral vein and including the common femoral, femoral, profunda femoral, popliteal and calf veins including the posterior tibial, peroneal and gastrocnemius veins when visible. The superficial great saphenous vein was also interrogated. Spectral Doppler was utilized to evaluate flow at rest and with distal augmentation maneuvers in the common femoral, femoral and popliteal veins. COMPARISON:  None. FINDINGS: RIGHT LOWER EXTREMITY Common Femoral  Vein: No evidence of thrombus. Normal compressibility, respiratory phasicity and response to augmentation. Saphenofemoral Junction: No evidence of thrombus. Normal compressibility and flow on color Doppler imaging. Profunda Femoral Vein: No evidence of thrombus. Normal compressibility and flow on color Doppler imaging. Femoral Vein: No evidence of thrombus. Normal compressibility, respiratory phasicity and response to augmentation. Popliteal Vein: No evidence of thrombus. Normal compressibility, respiratory phasicity and response to augmentation. Calf Veins: No evidence of thrombus. Normal compressibility and flow on color Doppler imaging. Superficial Great Saphenous Vein: No evidence of thrombus. Normal compressibility and flow on color Doppler imaging. Other Findings:  None. LEFT LOWER EXTREMITY Common Femoral Vein: No evidence of thrombus. Normal compressibility, respiratory phasicity and response to augmentation. Saphenofemoral Junction: No evidence of thrombus. Normal compressibility and flow on color Doppler imaging. Profunda Femoral Vein: No evidence of thrombus. Normal compressibility and flow on color Doppler imaging. Femoral Vein: No evidence of thrombus. Normal compressibility, respiratory phasicity and response to augmentation. Popliteal Vein: No evidence of thrombus. Normal compressibility, respiratory phasicity and response to augmentation. Calf Veins: No evidence of thrombus. Normal compressibility and flow on color Doppler imaging. Superficial Great Saphenous Vein: No evidence of thrombus. Normal compressibility and flow on color Doppler imaging. Other Findings:  None. IMPRESSION: Sonographic survey of the bilateral lower extremities negative for DVT Electronically Signed   By: Corrie Mckusick D.O.   On: 03/20/2017 14:37   Dg Chest Port 1 View  Result Date: 03/18/2017 CLINICAL DATA:  Upper abdominal pain onset tonight with diarrhea. EXAM: PORTABLE CHEST 1 VIEW COMPARISON:  None. FINDINGS: Heart is  enlarged. Aorta is nonaneurysmal. Atherosclerotic calcifications are seen at the aortic arch. Lung volumes are low. There is pulmonary vascular congestion with probable small left and tiny right effusions. Surgical clips project over the left axilla with small focus of air within the soft tissues of the left axilla possibly related to intervention or air trapped within skin fold. Small focus of soft tissue infection is not entirely excluded. No acute osseous abnormality. IMPRESSION: 1. Low lung volumes with cardiomegaly and CHF. Small left and tiny right effusions. 2. Small rounded focus of air in the soft tissues of the left axilla, nonspecific. Differential possibilities given above. Electronically Signed   By: Ashley Royalty M.D.   On: 03/18/2017 21:17     CBC Recent Labs  Lab 03/18/17 2050 03/19/17 0506 03/20/17 0427 03/21/17 0427  WBC 17.1* 19.3* 13.9* 11.3*  HGB 14.9 12.9 11.2* 10.5*  HCT 44.9 40.2 35.4* 33.5*  PLT 287 298 301 347  MCV 97.2 97.8 98.6 99.7  MCH 32.3 31.4 31.2 31.3  MCHC 33.2  32.1 31.6 31.3  RDW 13.2 13.2 12.9 13.4  LYMPHSABS 3.4 2.4  --   --   MONOABS 1.8* 1.4*  --   --   EOSABS 0.0 0.1  --   --   BASOSABS 0.0 0.0  --   --     Chemistries  Recent Labs  Lab 03/18/17 2050 03/20/17 0427 03/21/17 0427  NA 141 138 143  K 3.6 3.9 4.1  CL 106 105 110  CO2 20* 24 23  GLUCOSE 148* 97 101*  BUN 9 12 8   CREATININE 0.88 0.79 0.68  CALCIUM 9.8 8.5* 8.9  AST 24  --   --   ALT 14  --   --   ALKPHOS 66  --   --   BILITOT 0.8  --   --    ------------------------------------------------------------------------------------------------------------------ No results for input(s): CHOL, HDL, LDLCALC, TRIG, CHOLHDL, LDLDIRECT in the last 72 hours.  No results found for: HGBA1C ------------------------------------------------------------------------------------------------------------------ No results for input(s): TSH, T4TOTAL, T3FREE, THYROIDAB in the last 72  hours.  Invalid input(s): FREET3 ------------------------------------------------------------------------------------------------------------------ No results for input(s): VITAMINB12, FOLATE, FERRITIN, TIBC, IRON, RETICCTPCT in the last 72 hours.  Coagulation profile No results for input(s): INR, PROTIME in the last 168 hours.  No results for input(s): DDIMER in the last 72 hours.  Cardiac Enzymes Recent Labs  Lab 03/18/17 2346 03/19/17 0506 03/19/17 0857  TROPONINI 0.05* 0.03* 0.03*   ------------------------------------------------------------------------------------------------------------------    Component Value Date/Time   BNP 292.0 (H) 03/18/2017 2136    Roxan Hockey M.D on 03/21/2017 at 12:41 PM  Between 7am to 7pm - Pager - [phone]  After 7pm go to [email or web address] - password [email or web address]  [facility] -  Office  [phone]  Voice Recognition Viviann Spare dictation system was used to create this note, attempts have been made to correct errors. Please contact the author with questions and/or clarifications.

## 2017-03-21 NOTE — Progress Notes (Signed)
[place] for HEPARIN Indication: pulmonary embolus  Allergies  Allergen Reactions  . Meclizine Diarrhea  . Penicillins Rash and Other (See Comments)    Has patient had a PCN reaction causing immediate rash, facial/tongue/throat swelling, SOB or lightheadedness with hypotension: Yes Has patient had a PCN reaction causing severe rash involving mucus membranes or skin necrosis: No Has patient had a PCN reaction that required hospitalization No Has patient had a PCN reaction occurring within the last 10 years: No If all of the above answers are "NO", then may proceed with Cephalosporin use.    Patient Measurements: Height: 5\' 7"  (170.2 cm) Weight: 195 lb 5.2 oz (88.6 kg) IBW/kg (Calculated) : 61.6 HEPARIN DW (KG): 80.1  Vital Signs: Temp: 98.6 F (37 C) (01/27 0400) Temp Source: Oral (01/27 0400) BP: 145/85 (01/27 0800) Pulse Rate: 81 (01/27 0800)  Labs: Recent Labs    03/18/17 2050 03/18/17 2346 03/19/17 0506 03/19/17 0857  03/20/17 0427 03/20/17 1346 03/21/17 0427  HGB 14.9  --  12.9  --   --  11.2*  --  10.5*  HCT 44.9  --  40.2  --   --  35.4*  --  33.5*  PLT 287  --  298  --   --  301  --  347  HEPARINUNFRC  --   --   --   --    < > 0.82* 0.61 0.48  CREATININE 0.88  --   --   --   --  0.79  --  0.68  TROPONINI 0.04* 0.05* 0.03* 0.03*  --   --   --   --    < > = values in this interval not displayed.   Estimated Creatinine Clearance: 70.5 mL/min (by C-G formula based on SCr of 0.68 mg/dL).  Medical History: Past Medical History:  Diagnosis Date  . Bronchitis   . Cancer ([place])   . Chronic back pain   . Depression   . GERD (gastroesophageal reflux disease)   . Hyperlipidemia   . Hypertension   . Insomnia   . Kidney stone   . Pneumonia   . Vertigo    Medications:  Medications Prior to Admission  Medication Sig Dispense Refill Last Dose  . acetaminophen (TYLENOL) 500 MG tablet Take 1,000 mg by mouth every 6 (six)  hours as needed for mild pain or moderate pain.    unknown at Unknown time  . acyclovir (ZOVIRAX) 400 MG tablet TAKE 1 TABLET BY MOUTH TWICE DAILY 180 tablet 1 Past Week at Unknown time  . amLODipine-benazepril (LOTREL) 5-40 MG capsule TAKE 1 CAPSULE DAILY 90 capsule 1 Past Week at Unknown time  . aspirin 81 MG tablet Take 81 mg by mouth daily. Does not take every day   Past Week at Unknown time  . atorvastatin (LIPITOR) 20 MG tablet TAKE 1 TABLET EVERY EVENING 90 tablet 1 Past Week at Unknown time  . Fluoxetine HCl, PMDD, 10 MG TABS One tablet daily (Patient taking differently: Take 1 tablet by mouth daily. One tablet daily) 30 tablet 3 Past Week at Unknown time  . furosemide (LASIX) 20 MG tablet One tablet twice weekly as needed for ankle swelling (Patient taking differently: Take 20 mg by mouth 2 (two) times a week. One tablet twice weekly as needed for ankle swelling) 30 tablet 0 Past Week at Unknown time  . potassium chloride (KLOR-CON 10) 10 MEQ tablet One tablet twice weekly onlyon the days you take  the lasix ( fluid pill) 60 tablet 0 Past Week at Unknown time  . temazepam (RESTORIL) 30 MG capsule TAKE 1 CAPSULE BY MOUTH AT BEDTIME AS NEEDED 90 capsule 1 Past Week at Unknown time  . vitamin C (ASCORBIC ACID) 500 MG tablet Take 500 mg by mouth daily.   Past Week at Unknown time   Assessment: 77yo female with new PE.  Pharmacy asked to initiate Heparin . Heparin level is now at goal.    Goal of Therapy:  Heparin level 0.3-0.7 units/ml Monitor platelets by anticoagulation protocol: Yes   Plan:   Continue Heparin infusion at 1200 units/hr  Heparin level daily  CBC daily while on Heparin  [place] Crane, Whyatt Klinger A 03/21/2017,8:23 AM

## 2017-03-22 ENCOUNTER — Inpatient Hospital Stay (HOSPITAL_COMMUNITY): Payer: Medicare HMO

## 2017-03-22 DIAGNOSIS — I10 Essential (primary) hypertension: Secondary | ICD-10-CM

## 2017-03-22 DIAGNOSIS — I2699 Other pulmonary embolism without acute cor pulmonale: Principal | ICD-10-CM

## 2017-03-22 DIAGNOSIS — J810 Acute pulmonary edema: Secondary | ICD-10-CM

## 2017-03-22 DIAGNOSIS — J9601 Acute respiratory failure with hypoxia: Secondary | ICD-10-CM

## 2017-03-22 LAB — BASIC METABOLIC PANEL
Anion gap: 9 (ref 5–15)
BUN: 6 mg/dL (ref 6–20)
CO2: 23 mmol/L (ref 22–32)
Calcium: 9 mg/dL (ref 8.9–10.3)
Chloride: 109 mmol/L (ref 101–111)
Creatinine, Ser: 0.62 mg/dL (ref 0.44–1.00)
GFR calc Af Amer: >60 mL/min (ref >60)
GFR calc non Af Amer: >60 mL/min (ref >60)
Glucose, Bld: 95 mg/dL (ref 65–99)
Potassium: 4 mmol/L (ref 3.5–5.1)
Sodium: 141 mmol/L (ref 135–145)

## 2017-03-22 LAB — CBC
HCT: 33.7 % — ABNORMAL LOW (ref 36.0–46.0)
Hemoglobin: 10.6 g/dL — ABNORMAL LOW (ref 12.0–15.0)
MCH: 31.2 pg (ref 26.0–34.0)
MCHC: 31.5 g/dL (ref 30.0–36.0)
MCV: 99.1 fL (ref 78.0–100.0)
Platelets: 335 10*3/uL (ref 150–400)
RBC: 3.4 MIL/uL — ABNORMAL LOW (ref 3.87–5.11)
RDW: 13 % (ref 11.5–15.5)
WBC: 10.4 10*3/uL (ref 4.0–10.5)

## 2017-03-22 LAB — HEPARIN LEVEL (UNFRACTIONATED): Heparin Unfractionated: 0.27 IU/mL — ABNORMAL LOW (ref 0.30–0.70)

## 2017-03-22 MED ORDER — APIXABAN 5 MG PO TABS
10.0000 mg | ORAL_TABLET | Freq: Two times a day (BID) | ORAL | Status: DC
  Administered 2017-03-22 – 2017-03-24 (×5): 10 mg via ORAL
  Filled 2017-03-22 (×5): qty 2

## 2017-03-22 MED ORDER — DM-GUAIFENESIN ER 30-600 MG PO TB12
1.0000 | ORAL_TABLET | Freq: Two times a day (BID) | ORAL | Status: DC
  Administered 2017-03-22 – 2017-03-24 (×5): 1 via ORAL
  Filled 2017-03-22 (×5): qty 1

## 2017-03-22 MED ORDER — DM-GUAIFENESIN ER 30-600 MG PO TB12
1.0000 | ORAL_TABLET | Freq: Two times a day (BID) | ORAL | Status: DC | PRN
  Administered 2017-03-22: 1 via ORAL
  Filled 2017-03-22: qty 1

## 2017-03-22 MED ORDER — APIXABAN 5 MG PO TABS: 5.0000 mg | ORAL_TABLET | Freq: Two times a day (BID) | ORAL | Status: DC

## 2017-03-22 MED ORDER — GUAIFENESIN-DM 100-10 MG/5ML PO SYRP
5.0000 mL | ORAL_SOLUTION | ORAL | Status: DC | PRN
  Administered 2017-03-24: 5 mL via ORAL
  Filled 2017-03-22: qty 5

## 2017-03-22 MED ORDER — ALUM & MAG HYDROXIDE-SIMETH 200-200-20 MG/5ML PO SUSP
15.0000 mL | ORAL | Status: DC | PRN
  Administered 2017-03-22 – 2017-03-24 (×2): 15 mL via ORAL
  Filled 2017-03-22 (×2): qty 30

## 2017-03-22 MED ORDER — LEVALBUTEROL HCL 0.63 MG/3ML IN NEBU
0.6300 mg | INHALATION_SOLUTION | Freq: Four times a day (QID) | RESPIRATORY_TRACT | Status: DC
  Administered 2017-03-22 – 2017-03-23 (×4): 0.63 mg via RESPIRATORY_TRACT
  Filled 2017-03-22 (×4): qty 3

## 2017-03-22 NOTE — Care Management Note (Signed)
Case Management Note  Patient Details  Name: Theresa Garcia MRN: [id number] Date of Birth: 1940/05/12  Subjective/Objective:   Adm with bilateral pulmonary embolism. From home. Ind with ADL's. Wants to transition to Eliquis. Still acutely requiring oxygen.             Action/Plan: 30 day free voucher for Eliquis given and explained. Voucher given to sister at bedside. Will follow for needs. ?  Oxygen need.  Expected Discharge Date:  03/23/2017              Expected Discharge Plan:     In-House Referral:     Discharge planning Services  CM Consult, Medication Assistance  Post Acute Care Choice:    Choice offered to:     DME Arranged:    DME Agency:     HH Arranged:    HH Agency:     Status of Service:  In process, will continue to follow  If discussed at Long Length of Stay Meetings, dates discussed:    Additional Comments:  Phillip Maffei, Chauncey Reading, RN 03/22/2017, 11:15 AM

## 2017-03-22 NOTE — Evaluation (Signed)
Physical Therapy Evaluation Patient Details Name: Theresa Garcia MRN: [id number] DOB: 11-12-1940 Today's Date: 03/22/2017   History of Present Illness  Theresa Garcia is a 77yo black female who comes to [facility] on 03/19/17 d/t increased ABD discomfort and SOB. PMH: HTN, insomnia, and chornic LEE. CT angiogram (+) for bilat PE on 1/25.   Clinical Impression  Pt admitted with above diagnosis. Pt currently with functional limitations due to the deficits listed below (see "PT Problem List"). Upon entry, the patient is received semirecumbent in bed, Sisters present. The pt is awake and agreeable to participate. Pt c/o acute right shoulder pain at this time 7/10. Pt received on 1LPM O2, trialed on room-air c SpO2: 96% after AMB in room. Functional mobility assessment demonstrates moderate strength impairment in BLE, the pt now requiring Mod-assist physical assistance for transfers. AMB demonstrates impaired stability on feet, the patient now requires BUE support to maintain balance during household distance walking. The patient performed these at a higher level of independence PTA, AMB the community without AD. Pt will benefit from skilled PT intervention to increase independence and safety with basic mobility in preparation for discharge to the venue listed below.       Follow Up Recommendations SNF;Supervision for mobility/OOB    Equipment Recommendations  Rolling walker with 5" wheels    Recommendations for Other Services       Precautions / Restrictions Precautions Precautions: Fall Restrictions Weight Bearing Restrictions: No      Mobility  Bed Mobility Overal bed mobility: Needs Assistance Bed Mobility: Supine to Sit     Supine to sit: Supervision        Transfers Overall transfer level: Needs assistance Equipment used: 1 person hand held assist Transfers: Sit to/from Stand Sit to Stand: Mod assist         General transfer comment: Legs are weak, inspite of multiple  attempts eventually requires physical assist to rise  Ambulation/Gait Ambulation/Gait assistance: Min assist Ambulation Distance (Feet): 20 Feet Assistive device: 1 person hand held assist Gait Pattern/deviations: Wide base of support Gait velocity: <0.36m/s  Gait velocity interpretation: <1.8 ft/sec, indicative of risk for recurrent falls General Gait Details: slow and unsteady, requires i person HHA for stability.  Stairs            Wheelchair Mobility    Modified Rankin (Stroke Patients Only)       Balance Overall balance assessment: Needs assistance Sitting-balance support: Feet supported;No upper extremity supported Sitting balance-Leahy Scale: Good     Standing balance support: Bilateral upper extremity supported Standing balance-Leahy Scale: Fair                               Pertinent Vitals/Pain Pain Assessment: 0-10 Pain Score: 7  Pain Location: Right shoulder, insidious and acute Pain Intervention(s): Limited activity within patient's tolerance;Monitored during session    [place] expects to be discharged to:: Private residence Living Arrangements: Other relatives(her sister . ) Available Help at Discharge: Family(other sister) Type of Home: House       Home Layout: One level Home Equipment: None      Prior Function Level of Independence: Independent         Comments: AMB limited community distances PTA, driving and doing her own IADL.      Hand Dominance        Extremity/Trunk Assessment   Upper Extremity Assessment Upper Extremity Assessment: Generalized weakness;Overall [facility] for tasks  assessed    Lower Extremity Assessment Lower Extremity Assessment: Generalized weakness;Overall WFL for tasks assessed       Communication   Communication: No difficulties  Cognition Arousal/Alertness: Awake/alert Behavior During Therapy: WFL for tasks assessed/performed Overall Cognitive Status: Within  Functional Limits for tasks assessed                                        General Comments      Exercises     Assessment/Plan    PT Assessment Patient needs continued PT services  PT Problem List Decreased strength;Decreased balance;Decreased activity tolerance;Decreased mobility       PT Treatment Interventions Functional mobility training;Balance training;Patient/family education;Gait training;Therapeutic activities;Stair training;Therapeutic exercise    PT Goals (Current goals can be found in the Care Plan section)  Acute Rehab PT Goals Patient Stated Goal: Regain strength and independence in basic mobiility, transfers and AMB.  PT Goal Formulation: With patient Time For Goal Achievement: 04/05/17 Potential to Achieve Goals: Good    Frequency Min 2X/week   Barriers to discharge        Co-evaluation               AM-PAC PT "6 Clicks" Daily Activity  Outcome Measure Difficulty turning over in bed (including adjusting bedclothes, sheets and blankets)?: A Little Difficulty moving from lying on back to sitting on the side of the bed? : A Little Difficulty sitting down on and standing up from a chair with arms (e.g., wheelchair, bedside commode, etc,.)?: Unable Help needed moving to and from a bed to chair (including a wheelchair)?: Total Help needed walking in hospital room?: A Lot Help needed climbing 3-5 steps with a railing? : Total 6 Click Score: 11    End of Session Equipment Utilized During Treatment: Gait belt;Oxygen Activity Tolerance: Patient limited by fatigue;Patient limited by lethargy Patient left: with nursing/sitter in room;with call bell/phone within reach;with family/visitor present;Other (comment)(on toilet with sister, NA in attendance. ) Nurse Communication: Mobility status;Precautions PT Visit Diagnosis: Unsteadiness on feet (R26.81);Muscle weakness (generalized) (M62.81);Difficulty in walking, not elsewhere classified  (R26.2)    Time: 2585-2778 PT Time Calculation (min) (ACUTE ONLY): 23 min   Charges:   PT Evaluation $PT Eval Low Complexity: 1 Low PT Treatments $Therapeutic Activity: 8-22 mins   PT G Codes:        7:05 PM, 04/17/17 Etta Grandchild, PT, DPT Physical Therapist - [address] [phone] (Office)  Flavius Repsher C April 17, 2017, 7:01 PM

## 2017-03-22 NOTE — Discharge Instructions (Signed)
Information on my medicine - ELIQUIS (apixaban)  This medication education was reviewed with me or my healthcare representative as part of my discharge preparation.   Why was Eliquis prescribed for you? Eliquis was prescribed to treat blood clots that may have been found in the veins of your legs (deep vein thrombosis) or in your lungs (pulmonary embolism) and to reduce the risk of them occurring again.  What do You need to know about Eliquis ? The starting dose is 10 mg (two 5 mg tablets) taken TWICE daily for the FIRST SEVEN (7) DAYS, then on (enter date)  2/4  the dose is reduced to ONE 5 mg tablet taken TWICE daily.  Eliquis may be taken with or without food.   Try to take the dose about the same time in the morning and in the evening. If you have difficulty swallowing the tablet whole please discuss with your pharmacist how to take the medication safely.  Take Eliquis exactly as prescribed and DO NOT stop taking Eliquis without talking to the doctor who prescribed the medication.  Stopping may increase your risk of developing a new blood clot.  Refill your prescription before you run out.  After discharge, you should have regular check-up appointments with your healthcare provider that is prescribing your Eliquis.    What do you do if you miss a dose? If a dose of ELIQUIS is not taken at the scheduled time, take it as soon as possible on the same day and twice-daily administration should be resumed. The dose should not be doubled to make up for a missed dose.  Important Safety Information A possible side effect of Eliquis is bleeding. You should call your healthcare provider right away if you experience any of the following: ? Bleeding from an injury or your nose that does not stop. ? Unusual colored urine (red or dark brown) or unusual colored stools (red or black). ? Unusual bruising for unknown reasons. ? A serious fall or if you hit your head (even if there is no  bleeding).  Some medicines may interact with Eliquis and might increase your risk of bleeding or clotting while on Eliquis. To help avoid this, consult your healthcare provider or pharmacist prior to using any new prescription or non-prescription medications, including herbals, vitamins, non-steroidal anti-inflammatory drugs (NSAIDs) and supplements.  This website has more information on Eliquis (apixaban): http://[email or web address].com/eliquis/home

## 2017-03-22 NOTE — Progress Notes (Signed)
PROGRESS NOTE                                                                                                                                                                                                             Patient Demographics:    Theresa Garcia, is a 77 y.o. female, DOB - [birth year], WJX:[id number]  Admit date - 03/18/2017   Admitting Physician Vianne Bulls, MD  Outpatient Primary MD for the patient is Fayrene Helper, MD  LOS - 3  Outpatient Specialists: None  Chief Complaint  Patient presents with  . Abdominal Pain       Brief Narrative   77 year old female with history of hypertension, history of diastolic CHF, dyslipidemia presented with acute shortness of breath initially admitted with suspicion for CHF but given persistent tachycardia and tachypnea and hypoxia a CT angiogram of the chest was done which showed acute bilateral PE and was transferred to ICU on IV heparin drip.    Subjective:   Patient denies any chest pain symptoms but still reports difficulty getting up and speaking in full sentences with shortness of breath. Patient gets tachycardic up to 120s on minimal exertion.   Assessment  & Plan :    Principal Problem:   Acute bilateral pulmonary embolism ([place]) On IV heparin, transitioned to oral eliquis this morning. Patient still tachycardic to 100 and short of breath on minimal exertion although maintaining O2 sat in mid 90s on 1-2 L. 2-D echo with normal EF of 55 and 60% with no right heart strain. She will need at least 6 months of anticoagulation. Lower extremity Doppler negative for DVT. She does have prior history of breast cancer and strong family history for DVTs/PE.  Acute respiratory failure with hypoxia (HCC) Currently maintaining sats on 1-2 L but patient still quite short of breath on minimal exertion and unable to speak in full sentences. Rhonchus on exam. She was also  getting significant amount of IV fluids for? Hypotension (1 50 mL/h). I discontinued IV fluids and placed her on Xopenex nebs along with antitussives. Also ordered 1 dose of IV Lasix. Chest x-ray showed bibasilar atelectasis and small bilateral pleural effusion. Ordered but side spirometry. Wean oxygen as tolerated and evaluated for home O2 need prior to discharge.  Active Problems: Essential hypertension. Hypotensive associated with pulmonary embolism. Blood pressure medications  held. Currently stable. Will resume home medications in a.m.  Dyslipidemia Continue statin.  Physical deconditioning Worsened by her current PE. PT eval prior to discharge.    Code Status : Full code  Family Communication  : Sister at bedside Disposition: home in 1-2 days if respiratory function improves. Transferred to telemetry  Barriers For Discharge : Active symptoms  Consults  :  None  Procedures  :  CT angiogram of the chest 2-D echo  Doppler lower extremities  DVT Prophylaxis  :  eliquis  Lab Results  Component Value Date   PLT 335 03/22/2017    Antibiotics  :    Anti-infectives (From admission, onward)   Start     Dose/Rate Route Frequency Ordered Stop   03/19/17 0015  acyclovir (ZOVIRAX) tablet 400 mg     400 mg Oral 2 times daily 03/19/17 0012          Objective:   Vitals:   03/22/17 0727 03/22/17 0800 03/22/17 0959 03/22/17 1117  BP:  (!) 135/124  (!) 141/83  Pulse: 77 87    Resp: (!) 25 (!) 29    Temp: 98.8 F (37.1 C)   98 F (36.7 C)  TempSrc: Oral   Oral  SpO2: 96% 97% 96%   Weight:      Height:        Wt Readings from Last 3 Encounters:  03/22/17 91.2 kg (201 lb 1 oz)  01/18/17 88 kg (194 lb)  11/30/16 87.3 kg (192 lb 8 oz)     Intake/Output Summary (Last 24 hours) at 03/22/2017 1121 Last data filed at 03/22/2017 0220 Gross per 24 hour  Intake 2300 ml  Output 800 ml  Net 1500 ml     Physical Exam  Gen: Appears fatigued and in some distress HEENT:  no pallor, moist mucosa, supple neck Chest: Scattered rhonchi bilaterally with diminished bibasilar breath sounds CVS: S1 and S2 tachycardic, no murmurs rub or gallop GI: soft, NT, ND,  Musculoskeletal: warm, no edema     Data Review:    CBC Recent Labs  Lab 03/18/17 2050 03/19/17 0506 03/20/17 0427 03/21/17 0427 03/22/17 0448  WBC 17.1* 19.3* 13.9* 11.3* 10.4  HGB 14.9 12.9 11.2* 10.5* 10.6*  HCT 44.9 40.2 35.4* 33.5* 33.7*  PLT 287 298 301 347 335  MCV 97.2 97.8 98.6 99.7 99.1  MCH 32.3 31.4 31.2 31.3 31.2  MCHC 33.2 32.1 31.6 31.3 31.5  RDW 13.2 13.2 12.9 13.4 13.0  LYMPHSABS 3.4 2.4  --   --   --   MONOABS 1.8* 1.4*  --   --   --   EOSABS 0.0 0.1  --   --   --   BASOSABS 0.0 0.0  --   --   --     Chemistries  Recent Labs  Lab 03/18/17 2050 03/20/17 0427 03/21/17 0427 03/22/17 0448  NA 141 138 143 141  K 3.6 3.9 4.1 4.0  CL 106 105 110 109  CO2 20* 24 23 23   GLUCOSE 148* 97 101* 95  BUN 9 12 8 6   CREATININE 0.88 0.79 0.68 0.62  CALCIUM 9.8 8.5* 8.9 9.0  AST 24  --   --   --   ALT 14  --   --   --   ALKPHOS 66  --   --   --   BILITOT 0.8  --   --   --    ------------------------------------------------------------------------------------------------------------------ No results for input(s): CHOL, HDL, LDLCALC,  TRIG, CHOLHDL, LDLDIRECT in the last 72 hours.  No results found for: HGBA1C ------------------------------------------------------------------------------------------------------------------ No results for input(s): TSH, T4TOTAL, T3FREE, THYROIDAB in the last 72 hours.  Invalid input(s): FREET3 ------------------------------------------------------------------------------------------------------------------ No results for input(s): VITAMINB12, FOLATE, FERRITIN, TIBC, IRON, RETICCTPCT in the last 72 hours.  Coagulation profile No results for input(s): INR, PROTIME in the last 168 hours.  No results for input(s): DDIMER in the last 72  hours.  Cardiac Enzymes Recent Labs  Lab 03/18/17 2346 03/19/17 0506 03/19/17 0857  TROPONINI 0.05* 0.03* 0.03*   ------------------------------------------------------------------------------------------------------------------    Component Value Date/Time   BNP 292.0 (H) 03/18/2017 2136    Inpatient Medications  Scheduled Meds: . acyclovir  400 mg Oral BID  . apixaban  10 mg Oral BID   Followed by  . [START ON 03/29/2017] apixaban  5 mg Oral BID  . atorvastatin  20 mg Oral QPM  . dextromethorphan-guaiFENesin  1 tablet Oral BID  . FLUoxetine  10 mg Oral q morning - 10a  . levalbuterol  0.63 mg Nebulization Q6H  . mouth rinse  15 mL Mouth Rinse BID  . potassium chloride  10 mEq Oral BID  . sodium chloride flush  3 mL Intravenous Q12H   Continuous Infusions: . sodium chloride     PRN Meds:.sodium chloride, acetaminophen, ALPRAZolam, guaiFENesin-dextromethorphan, HYDROmorphone (DILAUDID) injection, meclizine, ondansetron (ZOFRAN) IV, sodium chloride flush, temazepam  Micro Results Recent Results (from the past 240 hour(s))  Culture, blood (Routine X 2) w Reflex to ID Panel     Status: None (Preliminary result)   Collection Time: 03/19/17  8:44 AM  Result Value Ref Range Status   Specimen Description BLOOD LEFT ARM  Final   Special Requests   Final    BOTTLES DRAWN AEROBIC AND ANAEROBIC Blood Culture adequate volume   Culture NO GROWTH 3 DAYS  Final   Report Status PENDING  Incomplete  Culture, blood (Routine X 2) w Reflex to ID Panel     Status: None (Preliminary result)   Collection Time: 03/19/17  8:44 AM  Result Value Ref Range Status   Specimen Description BLOOD LEFT HAND  Final   Special Requests   Final    BOTTLES DRAWN AEROBIC AND ANAEROBIC Blood Culture adequate volume   Culture NO GROWTH 3 DAYS  Final   Report Status PENDING  Incomplete  MRSA PCR Screening     Status: None   Collection Time: 03/19/17 12:45 PM  Result Value Ref Range Status   MRSA by PCR  NEGATIVE NEGATIVE Final    Comment:        The GeneXpert MRSA Assay (FDA approved for NASAL specimens only), is one component of a comprehensive MRSA colonization surveillance program. It is not intended to diagnose MRSA infection nor to guide or monitor treatment for MRSA infections.     Radiology Reports Ct Angio Chest Pe W Or Wo Contrast  Result Date: 03/19/2017 CLINICAL DATA:  Shortness of breath. EXAM: CT ANGIOGRAPHY CHEST WITH CONTRAST TECHNIQUE: Multidetector CT imaging of the chest was performed using the standard protocol during bolus administration of intravenous contrast. Multiplanar CT image reconstructions and MIPs were obtained to evaluate the vascular anatomy. CONTRAST:  79mL ISOVUE-370 IOPAMIDOL (ISOVUE-370) INJECTION 76% COMPARISON:  Chest radiograph 03/18/2017 FINDINGS: Cardiovascular: Bilateral pulmonary emboli. The most central pulmonary embolus is within the distal main right pulmonary artery, and extends to the bifurcation of the right lower lobar and right middle lobar pulmonary arteries. Small portion of the embolus is seen at the  takeoff of the right upper lobar pulmonary artery is well. The left-sided pulmonary emboli are within the left upper lobar pulmonary artery and segmental branches of the left lower lobar pulmonary artery. There is evidence of right heart strain with RV/LV ratio of 1.3. Mediastinum/Nodes: No enlarged mediastinal, hilar, or axillary lymph nodes. Thyroid gland, trachea, and esophagus demonstrate no significant findings. Lungs/Pleura: Right lower lobe atelectasis versus peribronchial airspace consolidation. Mild right middle lobe atelectasis. Small right pleural effusion. Minimal left lower lobe atelectasis. Upper Abdomen: No acute abnormality. Musculoskeletal: Postsurgical changes in the left chest wall/axilla. No evidence of fracture or significant osseous abnormality. Exaggeration of thoracic kyphosis with mild osteoarthritic changes. Review of the  MIP images confirms the above findings. IMPRESSION: Bilateral pulmonary emboli. The most central pulmonary embolus is within the distal right main pulmonary artery and extends to the bifurcation of the right lower and right middle lobar pulmonary arteries, where it becomes nearly occlusive. The left-sided pulmonary emboli are within the left upper lobar pulmonary artery and segmental branches of the left lower lobar pulmonary artery. Evidence of right heart strain with RV/LV ratio of 1.3 Right lower lobar atelectasis versus peribronchial airspace consolidation. Small right pleural effusion. Minimal left lower lobar atelectasis. These results were called by telephone at the time of interpretation on 03/19/2017 at 11:40 am to Dr. Roxan Hockey , who verbally acknowledged these results. Electronically Signed   By: Fidela Salisbury M.D.   On: 03/19/2017 11:43   Ct Abdomen Pelvis W Contrast  Result Date: 03/18/2017 CLINICAL DATA:  Abdominal discomfort, lower chest pain, watery diarrhea EXAM: CT ABDOMEN AND PELVIS WITH CONTRAST TECHNIQUE: Multidetector CT imaging of the abdomen and pelvis was performed using the standard protocol following bolus administration of intravenous contrast. CONTRAST:  132mL ISOVUE-300 IOPAMIDOL (ISOVUE-300) INJECTION 61% COMPARISON:  04/26/2015 FINDINGS: Lower chest: Lung bases demonstrate small right-sided pleural effusion with streaky atelectasis or infiltrate at the right base. Cardiomegaly. Hepatobiliary: Mild steatosis. No calcified gallstones or biliary dilatation. Gallbladder is contracted Pancreas: Unremarkable. No pancreatic ductal dilatation or surrounding inflammatory changes. Spleen: Normal in size without focal abnormality. Adrenals/Urinary Tract: Adrenal glands are within normal limits. No hydronephrosis. Cyst in the mid right kidney. Additional subcentimeter hypodensities too small to further characterize. The bladder is normal Stomach/Bowel: Stomach is within normal  limits. Appendix appears normal. No evidence of bowel wall thickening, distention, or inflammatory changes. Sigmoid colon scattered diverticula without acute inflammation Vascular/Lymphatic: Mild aortic atherosclerosis. No aneurysmal dilatation. No significantly enlarged lymph nodes Reproductive: Status post hysterectomy. No adnexal masses. Other: Negative for free air or free fluid. Musculoskeletal: Degenerative changes. No acute or suspicious lesion. IMPRESSION: 1. Negative for bowel obstruction or bowel wall thickening 2. Mild hepatic steatosis 3. Small right pleural effusion with streaky atelectasis or infiltrate at the right base. Electronically Signed   By: Donavan Foil M.D.   On: 03/18/2017 23:14   US Venous Img Lower Bilateral  Result Date: 03/20/2017 CLINICAL DATA:  77 year old female with a history bilateral pulmonary embolism EXAM: BILATERAL LOWER EXTREMITY VENOUS DOPPLER ULTRASOUND TECHNIQUE: Gray-scale sonography with graded compression, as well as color Doppler and duplex ultrasound were performed to evaluate the lower extremity deep venous systems from the level of the common femoral vein and including the common femoral, femoral, profunda femoral, popliteal and calf veins including the posterior tibial, peroneal and gastrocnemius veins when visible. The superficial great saphenous vein was also interrogated. Spectral Doppler was utilized to evaluate flow at rest and with distal augmentation maneuvers in the common femoral,  femoral and popliteal veins. COMPARISON:  None. FINDINGS: RIGHT LOWER EXTREMITY Common Femoral Vein: No evidence of thrombus. Normal compressibility, respiratory phasicity and response to augmentation. Saphenofemoral Junction: No evidence of thrombus. Normal compressibility and flow on color Doppler imaging. Profunda Femoral Vein: No evidence of thrombus. Normal compressibility and flow on color Doppler imaging. Femoral Vein: No evidence of thrombus. Normal compressibility,  respiratory phasicity and response to augmentation. Popliteal Vein: No evidence of thrombus. Normal compressibility, respiratory phasicity and response to augmentation. Calf Veins: No evidence of thrombus. Normal compressibility and flow on color Doppler imaging. Superficial Great Saphenous Vein: No evidence of thrombus. Normal compressibility and flow on color Doppler imaging. Other Findings:  None. LEFT LOWER EXTREMITY Common Femoral Vein: No evidence of thrombus. Normal compressibility, respiratory phasicity and response to augmentation. Saphenofemoral Junction: No evidence of thrombus. Normal compressibility and flow on color Doppler imaging. Profunda Femoral Vein: No evidence of thrombus. Normal compressibility and flow on color Doppler imaging. Femoral Vein: No evidence of thrombus. Normal compressibility, respiratory phasicity and response to augmentation. Popliteal Vein: No evidence of thrombus. Normal compressibility, respiratory phasicity and response to augmentation. Calf Veins: No evidence of thrombus. Normal compressibility and flow on color Doppler imaging. Superficial Great Saphenous Vein: No evidence of thrombus. Normal compressibility and flow on color Doppler imaging. Other Findings:  None. IMPRESSION: Sonographic survey of the bilateral lower extremities negative for DVT Electronically Signed   By: Corrie Mckusick D.O.   On: 03/20/2017 14:37   Dg Chest Port 1 View  Result Date: 03/22/2017 CLINICAL DATA:  Shortness of breath. History of hypertension. Ex-smoker. EXAM: PORTABLE CHEST 1 VIEW COMPARISON:  CT 03/19/2017.  Radiographs 03/18/2017. FINDINGS: The heart size and mediastinal contours stable. No significant change in bibasilar pulmonary opacities and bilateral pleural effusions. There is no edema or pneumothorax. Surgical clips are present in the left axilla. No acute osseous findings. IMPRESSION: No significant change in bibasilar atelectasis and small bilateral pleural effusions. Known  pulmonary embolism on recent CT. Electronically Signed   By: Richardean Sale M.D.   On: 03/22/2017 10:24   Dg Chest Port 1 View  Result Date: 03/18/2017 CLINICAL DATA:  Upper abdominal pain onset tonight with diarrhea. EXAM: PORTABLE CHEST 1 VIEW COMPARISON:  None. FINDINGS: Heart is enlarged. Aorta is nonaneurysmal. Atherosclerotic calcifications are seen at the aortic arch. Lung volumes are low. There is pulmonary vascular congestion with probable small left and tiny right effusions. Surgical clips project over the left axilla with small focus of air within the soft tissues of the left axilla possibly related to intervention or air trapped within skin fold. Small focus of soft tissue infection is not entirely excluded. No acute osseous abnormality. IMPRESSION: 1. Low lung volumes with cardiomegaly and CHF. Small left and tiny right effusions. 2. Small rounded focus of air in the soft tissues of the left axilla, nonspecific. Differential possibilities given above. Electronically Signed   By: Ashley Royalty M.D.   On: 03/18/2017 21:17    Time Spent in minutes  35   Tauren Delbuono M.D on 03/22/2017 at 11:21 AM  Between 7am to 7pm - Pager - [phone]  After 7pm go to [email or web address] - password [email or web address]  [facility] -  Office  [phone]

## 2017-03-22 NOTE — Progress Notes (Signed)
Pt is complaining of SOB even after nebulizer treatment. Placed pt on 2L [place] to help with SOB

## 2017-03-22 NOTE — Progress Notes (Signed)
Report given to [facility] on 300.  Ger Ringenberg Rica Mote, RN

## 2017-03-22 NOTE — Progress Notes (Signed)
[place] for HEPARIN Indication: pulmonary embolus  Allergies  Allergen Reactions  . Meclizine Diarrhea  . Penicillins Rash and Other (See Comments)    Has patient had a PCN reaction causing immediate rash, facial/tongue/throat swelling, SOB or lightheadedness with hypotension: Yes Has patient had a PCN reaction causing severe rash involving mucus membranes or skin necrosis: No Has patient had a PCN reaction that required hospitalization No Has patient had a PCN reaction occurring within the last 10 years: No If all of the above answers are "NO", then may proceed with Cephalosporin use.    Patient Measurements: Height: 5\' 7"  (170.2 cm) Weight: 201 lb 1 oz (91.2 kg) IBW/kg (Calculated) : 61.6 HEPARIN DW (KG): 80.1  Vital Signs: Temp: 98.8 F (37.1 C) (01/28 0727) Temp Source: Oral (01/28 0727) BP: 142/82 (01/28 0000) Pulse Rate: 77 (01/28 0727)  Labs: Recent Labs    03/19/17 0857  03/20/17 0427 03/20/17 1346 03/21/17 0427 03/22/17 0448  HGB  --    < > 11.2*  --  10.5* 10.6*  HCT  --   --  35.4*  --  33.5* 33.7*  PLT  --   --  301  --  347 335  HEPARINUNFRC  --    < > 0.82* 0.61 0.48 0.27*  CREATININE  --   --  0.79  --  0.68 0.62  TROPONINI 0.03*  --   --   --   --   --    < > = values in this interval not displayed.   Estimated Creatinine Clearance: 71.5 mL/min (by C-G formula based on SCr of 0.62 mg/dL).  Medical History: Past Medical History:  Diagnosis Date  . Bronchitis   . Cancer ([place])   . Chronic back pain   . Depression   . GERD (gastroesophageal reflux disease)   . Hyperlipidemia   . Hypertension   . Insomnia   . Kidney stone   . Pneumonia   . Vertigo    Medications:  Medications Prior to Admission  Medication Sig Dispense Refill Last Dose  . acetaminophen (TYLENOL) 500 MG tablet Take 1,000 mg by mouth every 6 (six) hours as needed for mild pain or moderate pain.    unknown at Unknown time  . acyclovir  (ZOVIRAX) 400 MG tablet TAKE 1 TABLET BY MOUTH TWICE DAILY 180 tablet 1 Past Week at Unknown time  . amLODipine-benazepril (LOTREL) 5-40 MG capsule TAKE 1 CAPSULE DAILY 90 capsule 1 Past Week at Unknown time  . aspirin 81 MG tablet Take 81 mg by mouth daily. Does not take every day   Past Week at Unknown time  . atorvastatin (LIPITOR) 20 MG tablet TAKE 1 TABLET EVERY EVENING 90 tablet 1 Past Week at Unknown time  . Fluoxetine HCl, PMDD, 10 MG TABS One tablet daily (Patient taking differently: Take 1 tablet by mouth daily. One tablet daily) 30 tablet 3 Past Week at Unknown time  . furosemide (LASIX) 20 MG tablet One tablet twice weekly as needed for ankle swelling (Patient taking differently: Take 20 mg by mouth 2 (two) times a week. One tablet twice weekly as needed for ankle swelling) 30 tablet 0 Past Week at Unknown time  . potassium chloride (KLOR-CON 10) 10 MEQ tablet One tablet twice weekly onlyon the days you take the lasix ( fluid pill) 60 tablet 0 Past Week at Unknown time  . temazepam (RESTORIL) 30 MG capsule TAKE 1 CAPSULE BY MOUTH AT BEDTIME AS NEEDED  90 capsule 1 Past Week at Unknown time  . vitamin C (ASCORBIC ACID) 500 MG tablet Take 500 mg by mouth daily.   Past Week at Unknown time   Assessment: 77yo female with new PE.  Pharmacy asked to initiate Heparin. Heparin level is below goal.    Goal of Therapy:  Heparin level 0.3-0.7 units/ml Monitor platelets by anticoagulation protocol: Yes   Plan:   Increase Heparin infusion to 1300 units/hr  Heparin level in 6-8 hrs then daily  CBC daily while on Heparin  [place] Crane, Vernia Teem A 03/22/2017,7:38 AM

## 2017-03-22 NOTE — Progress Notes (Signed)
ANTICOAGULATION CONSULT NOTE - Initial Consult  Pharmacy Consult for apixaban Indication: acute PE  Allergies  Allergen Reactions  . Meclizine Diarrhea  . Penicillins Rash and Other (See Comments)    Has patient had a PCN reaction causing immediate rash, facial/tongue/throat swelling, SOB or lightheadedness with hypotension: Yes Has patient had a PCN reaction causing severe rash involving mucus membranes or skin necrosis: No Has patient had a PCN reaction that required hospitalization No Has patient had a PCN reaction occurring within the last 10 years: No If all of the above answers are "NO", then may proceed with Cephalosporin use.     Patient Measurements: Height: 5\' 7"  (170.2 cm) Weight: 201 lb 1 oz (91.2 kg) IBW/kg (Calculated) : 61.6   Vital Signs: Temp: 98.8 F (37.1 C) (01/28 0727) Temp Source: Oral (01/28 0727) BP: 135/124 (01/28 0800) Pulse Rate: 87 (01/28 0800)  Labs: Recent Labs    03/20/17 0427 03/20/17 1346 03/21/17 0427 03/22/17 0448  HGB 11.2*  --  10.5* 10.6*  HCT 35.4*  --  33.5* 33.7*  PLT 301  --  347 335  HEPARINUNFRC 0.82* 0.61 0.48 0.27*  CREATININE 0.79  --  0.68 0.62    Estimated Creatinine Clearance: 71.5 mL/min (by C-G formula based on SCr of 0.62 mg/dL).   Medical History: Past Medical History:  Diagnosis Date  . Bronchitis   . Cancer ([place])   . Chronic back pain   . Depression   . GERD (gastroesophageal reflux disease)   . Hyperlipidemia   . Hypertension   . Insomnia   . Kidney stone   . Pneumonia   . Vertigo     Medications:  Scheduled:  . acyclovir  400 mg Oral BID  . apixaban  10 mg Oral BID   Followed by  . [START ON 03/29/2017] apixaban  5 mg Oral BID  . atorvastatin  20 mg Oral QPM  . dextromethorphan-guaiFENesin  1 tablet Oral BID  . FLUoxetine  10 mg Oral q morning - 10a  . levalbuterol  0.63 mg Nebulization Q6H  . mouth rinse  15 mL Mouth Rinse BID  . potassium chloride  10 mEq Oral BID  . sodium chloride  flush  3 mL Intravenous Q12H    Assessment: 77 year old female with acute PE. Pharmacy asked to initiate apixaban.  Goal of Therapy:   Monitor platelets by anticoagulation protocol: Yes   Plan:  Apixaban 10 mg BID x 7 days. Followed by Apixaban 5 mg BID daily  Monitor labs and s/s of bleeding  Margot Ables, PharmD Clinical Pharmacist 03/22/2017 9:04 AM

## 2017-03-23 LAB — CBC
HCT: 36.9 % (ref 36.0–46.0)
Hemoglobin: 11.9 g/dL — ABNORMAL LOW (ref 12.0–15.0)
MCH: 31.4 pg (ref 26.0–34.0)
MCHC: 32.2 g/dL (ref 30.0–36.0)
MCV: 97.4 fL (ref 78.0–100.0)
Platelets: 401 10*3/uL — ABNORMAL HIGH (ref 150–400)
RBC: 3.79 MIL/uL — ABNORMAL LOW (ref 3.87–5.11)
RDW: 12.8 % (ref 11.5–15.5)
WBC: 9.9 10*3/uL (ref 4.0–10.5)

## 2017-03-23 LAB — BASIC METABOLIC PANEL
Anion gap: 12 (ref 5–15)
BUN: 8 mg/dL (ref 6–20)
CO2: 23 mmol/L (ref 22–32)
Calcium: 9.5 mg/dL (ref 8.9–10.3)
Chloride: 104 mmol/L (ref 101–111)
Creatinine, Ser: 0.74 mg/dL (ref 0.44–1.00)
GFR calc Af Amer: >60 mL/min (ref >60)
GFR calc non Af Amer: >60 mL/min (ref >60)
Glucose, Bld: 101 mg/dL — ABNORMAL HIGH (ref 65–99)
Potassium: 3.8 mmol/L (ref 3.5–5.1)
Sodium: 139 mmol/L (ref 135–145)

## 2017-03-23 MED ORDER — SENNOSIDES-DOCUSATE SODIUM 8.6-50 MG PO TABS
2.0000 | ORAL_TABLET | Freq: Every evening | ORAL | Status: DC | PRN
  Administered 2017-03-23: 2 via ORAL
  Filled 2017-03-23: qty 2

## 2017-03-23 MED ORDER — BISACODYL 10 MG RE SUPP: 10.0000 mg | Freq: Every day | RECTAL | Status: DC | PRN

## 2017-03-23 MED ORDER — LEVALBUTEROL HCL 0.63 MG/3ML IN NEBU
0.6300 mg | INHALATION_SOLUTION | Freq: Two times a day (BID) | RESPIRATORY_TRACT | Status: DC
  Administered 2017-03-23 – 2017-03-24 (×2): 0.63 mg via RESPIRATORY_TRACT
  Filled 2017-03-23 (×2): qty 3

## 2017-03-23 NOTE — Progress Notes (Signed)
PROGRESS NOTE                                                                                                                                                                                                             Patient Demographics:    Theresa Garcia, is a 77 y.o. female, DOB - [birth year], WYO:[id number]  Admit date - 03/18/2017   Admitting Physician Vianne Bulls, MD  Outpatient Primary MD for the patient is Fayrene Helper, MD  LOS - 4  Outpatient Specialists: None  Chief Complaint  Patient presents with  . Abdominal Pain       Brief Narrative   77 year old female with history of hypertension, history of diastolic CHF, dyslipidemia presented with acute shortness of breath initially admitted with suspicion for CHF but given persistent tachycardia and tachypnea and hypoxia a CT angiogram of the chest was done which showed acute bilateral PE and was transferred to ICU on IV heparin drip.    Subjective:   Still feels short of breath on minimal exertion and very tired. Had fever of 100.6Fovernight.  Assessment  & Plan :    Principal Problem:   Acute bilateral pulmonary embolism (HCC) Heparin transition to eliquis. sats maintained on 2 L via nasal cannula but still very weak and tachycardic. 2-D echo with normal EF of 55 and 60% with no right heart strain. prior history of breast cancer and strong family history for DVTs/PE. She will need at least 6 months of anticoagulation. Lower extremity Doppler negative for DVT.   Acute respiratory failure with hypoxia (HCC)  still quite short of breath on minimal exertion .. Maintaining sats on 2L. No infiltrate on chest x-ray. Discontinued IV fluids. Continue when necessary nebs and bedside spirometry.  Active Problems: Essential hypertension. Hypotensive associated with pulmonary embolism. Blood pressure medications held. Currently stable. Resume home  medications.  Dyslipidemia Continue statin.  Physical deconditioning Worsened by her current PE. PT recommends SNF  Fever Likely secondary to clot burden. Chest x-ray negative for infiltrate. Monitor for now  Code Status : Full code  Family Communication  : family at bedside. Discussed with sister on the phone Disposition: sNF possibly in the next 2 days if dyspnea and tachycardia improves  Barriers For Discharge : Active symptoms  Consults  :  None  Procedures  :  CT angiogram  of the chest 2-D echo  Doppler lower extremities  DVT Prophylaxis  :  eliquis  Lab Results  Component Value Date   PLT 401 (H) 03/23/2017    Antibiotics  :    Anti-infectives (From admission, onward)   Start     Dose/Rate Route Frequency Ordered Stop   03/19/17 0015  acyclovir (ZOVIRAX) tablet 400 mg     400 mg Oral 2 times daily 03/19/17 0012          Objective:   Vitals:   03/23/17 0300 03/23/17 0500 03/23/17 0737 03/23/17 1019  BP: 138/90     Pulse: (!) 118     Resp: 15     Temp: (!) 100.6 F (38.1 C)   98.5 F (36.9 C)  TempSrc: Oral   Oral  SpO2: 92%  92%   Weight:  90.1 kg (198 lb 9.6 oz)    Height:        Wt Readings from Last 3 Encounters:  03/23/17 90.1 kg (198 lb 9.6 oz)  01/18/17 88 kg (194 lb)  11/30/16 87.3 kg (192 lb 8 oz)     Intake/Output Summary (Last 24 hours) at 03/23/2017 1323 Last data filed at 03/23/2017 0900 Gross per 24 hour  Intake 480 ml  Output 500 ml  Net -20 ml     Physical Exam General: Extremely fatigued and sweaty HEENT: Moist mucosa, supple neck Chest: Few scattered rhonchi bilaterally CVS: S1 and S2 tachycardic, no murmurs GI: Soft, nondistended, nontender Musculoskeletal: Warm, no edema     Data Review:    CBC Recent Labs  Lab 03/18/17 2050 03/19/17 0506 03/20/17 0427 03/21/17 0427 03/22/17 0448 03/23/17 0501  WBC 17.1* 19.3* 13.9* 11.3* 10.4 9.9  HGB 14.9 12.9 11.2* 10.5* 10.6* 11.9*  HCT 44.9 40.2 35.4* 33.5*  33.7* 36.9  PLT 287 298 301 347 335 401*  MCV 97.2 97.8 98.6 99.7 99.1 97.4  MCH 32.3 31.4 31.2 31.3 31.2 31.4  MCHC 33.2 32.1 31.6 31.3 31.5 32.2  RDW 13.2 13.2 12.9 13.4 13.0 12.8  LYMPHSABS 3.4 2.4  --   --   --   --   MONOABS 1.8* 1.4*  --   --   --   --   EOSABS 0.0 0.1  --   --   --   --   BASOSABS 0.0 0.0  --   --   --   --     Chemistries  Recent Labs  Lab 03/18/17 2050 03/20/17 0427 03/21/17 0427 03/22/17 0448 03/23/17 0501  NA 141 138 143 141 139  K 3.6 3.9 4.1 4.0 3.8  CL 106 105 110 109 104  CO2 20* 24 23 23 23   GLUCOSE 148* 97 101* 95 101*  BUN 9 12 8 6 8   CREATININE 0.88 0.79 0.68 0.62 0.74  CALCIUM 9.8 8.5* 8.9 9.0 9.5  AST 24  --   --   --   --   ALT 14  --   --   --   --   ALKPHOS 66  --   --   --   --   BILITOT 0.8  --   --   --   --    ------------------------------------------------------------------------------------------------------------------ No results for input(s): CHOL, HDL, LDLCALC, TRIG, CHOLHDL, LDLDIRECT in the last 72 hours.  No results found for: HGBA1C ------------------------------------------------------------------------------------------------------------------ No results for input(s): TSH, T4TOTAL, T3FREE, THYROIDAB in the last 72 hours.  Invalid input(s): FREET3 ------------------------------------------------------------------------------------------------------------------ No results for input(s): VITAMINB12, FOLATE,  FERRITIN, TIBC, IRON, RETICCTPCT in the last 72 hours.  Coagulation profile No results for input(s): INR, PROTIME in the last 168 hours.  No results for input(s): DDIMER in the last 72 hours.  Cardiac Enzymes Recent Labs  Lab 03/18/17 2346 03/19/17 0506 03/19/17 0857  TROPONINI 0.05* 0.03* 0.03*   ------------------------------------------------------------------------------------------------------------------    Component Value Date/Time   BNP 292.0 (H) 03/18/2017 2136    Inpatient  Medications  Scheduled Meds: . acyclovir  400 mg Oral BID  . apixaban  10 mg Oral BID   Followed by  . [START ON 03/29/2017] apixaban  5 mg Oral BID  . atorvastatin  20 mg Oral QPM  . dextromethorphan-guaiFENesin  1 tablet Oral BID  . FLUoxetine  10 mg Oral q morning - 10a  . levalbuterol  0.63 mg Nebulization Q6H  . mouth rinse  15 mL Mouth Rinse BID  . potassium chloride  10 mEq Oral BID  . sodium chloride flush  3 mL Intravenous Q12H   Continuous Infusions: . sodium chloride     PRN Meds:.sodium chloride, acetaminophen, ALPRAZolam, alum & mag hydroxide-simeth, guaiFENesin-dextromethorphan, HYDROmorphone (DILAUDID) injection, meclizine, ondansetron (ZOFRAN) IV, sodium chloride flush, temazepam  Micro Results Recent Results (from the past 240 hour(s))  Culture, blood (Routine X 2) w Reflex to ID Panel     Status: None (Preliminary result)   Collection Time: 03/19/17  8:44 AM  Result Value Ref Range Status   Specimen Description BLOOD LEFT ARM  Final   Special Requests   Final    BOTTLES DRAWN AEROBIC AND ANAEROBIC Blood Culture adequate volume   Culture NO GROWTH 4 DAYS  Final   Report Status PENDING  Incomplete  Culture, blood (Routine X 2) w Reflex to ID Panel     Status: None (Preliminary result)   Collection Time: 03/19/17  8:44 AM  Result Value Ref Range Status   Specimen Description BLOOD LEFT HAND  Final   Special Requests   Final    BOTTLES DRAWN AEROBIC AND ANAEROBIC Blood Culture adequate volume   Culture NO GROWTH 4 DAYS  Final   Report Status PENDING  Incomplete  MRSA PCR Screening     Status: None   Collection Time: 03/19/17 12:45 PM  Result Value Ref Range Status   MRSA by PCR NEGATIVE NEGATIVE Final    Comment:        The GeneXpert MRSA Assay (FDA approved for NASAL specimens only), is one component of a comprehensive MRSA colonization surveillance program. It is not intended to diagnose MRSA infection nor to guide or monitor treatment for MRSA  infections.     Radiology Reports Ct Angio Chest Pe W Or Wo Contrast  Result Date: 03/19/2017 CLINICAL DATA:  Shortness of breath. EXAM: CT ANGIOGRAPHY CHEST WITH CONTRAST TECHNIQUE: Multidetector CT imaging of the chest was performed using the standard protocol during bolus administration of intravenous contrast. Multiplanar CT image reconstructions and MIPs were obtained to evaluate the vascular anatomy. CONTRAST:  46mL ISOVUE-370 IOPAMIDOL (ISOVUE-370) INJECTION 76% COMPARISON:  Chest radiograph 03/18/2017 FINDINGS: Cardiovascular: Bilateral pulmonary emboli. The most central pulmonary embolus is within the distal main right pulmonary artery, and extends to the bifurcation of the right lower lobar and right middle lobar pulmonary arteries. Small portion of the embolus is seen at the takeoff of the right upper lobar pulmonary artery is well. The left-sided pulmonary emboli are within the left upper lobar pulmonary artery and segmental branches of the left lower lobar pulmonary artery. There is evidence  of right heart strain with RV/LV ratio of 1.3. Mediastinum/Nodes: No enlarged mediastinal, hilar, or axillary lymph nodes. Thyroid gland, trachea, and esophagus demonstrate no significant findings. Lungs/Pleura: Right lower lobe atelectasis versus peribronchial airspace consolidation. Mild right middle lobe atelectasis. Small right pleural effusion. Minimal left lower lobe atelectasis. Upper Abdomen: No acute abnormality. Musculoskeletal: Postsurgical changes in the left chest wall/axilla. No evidence of fracture or significant osseous abnormality. Exaggeration of thoracic kyphosis with mild osteoarthritic changes. Review of the MIP images confirms the above findings. IMPRESSION: Bilateral pulmonary emboli. The most central pulmonary embolus is within the distal right main pulmonary artery and extends to the bifurcation of the right lower and right middle lobar pulmonary arteries, where it becomes nearly  occlusive. The left-sided pulmonary emboli are within the left upper lobar pulmonary artery and segmental branches of the left lower lobar pulmonary artery. Evidence of right heart strain with RV/LV ratio of 1.3 Right lower lobar atelectasis versus peribronchial airspace consolidation. Small right pleural effusion. Minimal left lower lobar atelectasis. These results were called by telephone at the time of interpretation on 03/19/2017 at 11:40 am to Dr. Roxan Hockey , who verbally acknowledged these results. Electronically Signed   By: Fidela Salisbury M.D.   On: 03/19/2017 11:43   Ct Abdomen Pelvis W Contrast  Result Date: 03/18/2017 CLINICAL DATA:  Abdominal discomfort, lower chest pain, watery diarrhea EXAM: CT ABDOMEN AND PELVIS WITH CONTRAST TECHNIQUE: Multidetector CT imaging of the abdomen and pelvis was performed using the standard protocol following bolus administration of intravenous contrast. CONTRAST:  122mL ISOVUE-300 IOPAMIDOL (ISOVUE-300) INJECTION 61% COMPARISON:  04/26/2015 FINDINGS: Lower chest: Lung bases demonstrate small right-sided pleural effusion with streaky atelectasis or infiltrate at the right base. Cardiomegaly. Hepatobiliary: Mild steatosis. No calcified gallstones or biliary dilatation. Gallbladder is contracted Pancreas: Unremarkable. No pancreatic ductal dilatation or surrounding inflammatory changes. Spleen: Normal in size without focal abnormality. Adrenals/Urinary Tract: Adrenal glands are within normal limits. No hydronephrosis. Cyst in the mid right kidney. Additional subcentimeter hypodensities too small to further characterize. The bladder is normal Stomach/Bowel: Stomach is within normal limits. Appendix appears normal. No evidence of bowel wall thickening, distention, or inflammatory changes. Sigmoid colon scattered diverticula without acute inflammation Vascular/Lymphatic: Mild aortic atherosclerosis. No aneurysmal dilatation. No significantly enlarged lymph nodes  Reproductive: Status post hysterectomy. No adnexal masses. Other: Negative for free air or free fluid. Musculoskeletal: Degenerative changes. No acute or suspicious lesion. IMPRESSION: 1. Negative for bowel obstruction or bowel wall thickening 2. Mild hepatic steatosis 3. Small right pleural effusion with streaky atelectasis or infiltrate at the right base. Electronically Signed   By: Donavan Foil M.D.   On: 03/18/2017 23:14   US Venous Img Lower Bilateral  Result Date: 03/20/2017 CLINICAL DATA:  77 year old female with a history bilateral pulmonary embolism EXAM: BILATERAL LOWER EXTREMITY VENOUS DOPPLER ULTRASOUND TECHNIQUE: Gray-scale sonography with graded compression, as well as color Doppler and duplex ultrasound were performed to evaluate the lower extremity deep venous systems from the level of the common femoral vein and including the common femoral, femoral, profunda femoral, popliteal and calf veins including the posterior tibial, peroneal and gastrocnemius veins when visible. The superficial great saphenous vein was also interrogated. Spectral Doppler was utilized to evaluate flow at rest and with distal augmentation maneuvers in the common femoral, femoral and popliteal veins. COMPARISON:  None. FINDINGS: RIGHT LOWER EXTREMITY Common Femoral Vein: No evidence of thrombus. Normal compressibility, respiratory phasicity and response to augmentation. Saphenofemoral Junction: No evidence of thrombus. Normal compressibility and  flow on color Doppler imaging. Profunda Femoral Vein: No evidence of thrombus. Normal compressibility and flow on color Doppler imaging. Femoral Vein: No evidence of thrombus. Normal compressibility, respiratory phasicity and response to augmentation. Popliteal Vein: No evidence of thrombus. Normal compressibility, respiratory phasicity and response to augmentation. Calf Veins: No evidence of thrombus. Normal compressibility and flow on color Doppler imaging. Superficial Great  Saphenous Vein: No evidence of thrombus. Normal compressibility and flow on color Doppler imaging. Other Findings:  None. LEFT LOWER EXTREMITY Common Femoral Vein: No evidence of thrombus. Normal compressibility, respiratory phasicity and response to augmentation. Saphenofemoral Junction: No evidence of thrombus. Normal compressibility and flow on color Doppler imaging. Profunda Femoral Vein: No evidence of thrombus. Normal compressibility and flow on color Doppler imaging. Femoral Vein: No evidence of thrombus. Normal compressibility, respiratory phasicity and response to augmentation. Popliteal Vein: No evidence of thrombus. Normal compressibility, respiratory phasicity and response to augmentation. Calf Veins: No evidence of thrombus. Normal compressibility and flow on color Doppler imaging. Superficial Great Saphenous Vein: No evidence of thrombus. Normal compressibility and flow on color Doppler imaging. Other Findings:  None. IMPRESSION: Sonographic survey of the bilateral lower extremities negative for DVT Electronically Signed   By: Corrie Mckusick D.O.   On: 03/20/2017 14:37   Dg Chest Port 1 View  Result Date: 03/22/2017 CLINICAL DATA:  Shortness of breath. History of hypertension. Ex-smoker. EXAM: PORTABLE CHEST 1 VIEW COMPARISON:  CT 03/19/2017.  Radiographs 03/18/2017. FINDINGS: The heart size and mediastinal contours stable. No significant change in bibasilar pulmonary opacities and bilateral pleural effusions. There is no edema or pneumothorax. Surgical clips are present in the left axilla. No acute osseous findings. IMPRESSION: No significant change in bibasilar atelectasis and small bilateral pleural effusions. Known pulmonary embolism on recent CT. Electronically Signed   By: Richardean Sale M.D.   On: 03/22/2017 10:24   Dg Chest Port 1 View  Result Date: 03/18/2017 CLINICAL DATA:  Upper abdominal pain onset tonight with diarrhea. EXAM: PORTABLE CHEST 1 VIEW COMPARISON:  None. FINDINGS: Heart  is enlarged. Aorta is nonaneurysmal. Atherosclerotic calcifications are seen at the aortic arch. Lung volumes are low. There is pulmonary vascular congestion with probable small left and tiny right effusions. Surgical clips project over the left axilla with small focus of air within the soft tissues of the left axilla possibly related to intervention or air trapped within skin fold. Small focus of soft tissue infection is not entirely excluded. No acute osseous abnormality. IMPRESSION: 1. Low lung volumes with cardiomegaly and CHF. Small left and tiny right effusions. 2. Small rounded focus of air in the soft tissues of the left axilla, nonspecific. Differential possibilities given above. Electronically Signed   By: Ashley Royalty M.D.   On: 03/18/2017 21:17    Time Spent in minutes  25   Adil Tugwell M.D on 03/23/2017 at 1:23 PM  Between 7am to 7pm - Pager - [phone]  After 7pm go to [email or web address] - password [facility]  [facility] -  Office  [phone]

## 2017-03-23 NOTE — Care Management Note (Signed)
Case Management Note  Patient Details  Name: Theresa Garcia MRN: [id number] Date of Birth: 23-Nov-1940   If discussed at Long Length of Stay Meetings, dates discussed:  03/23/2016  Additional Comments:  Dillon Mcreynolds, Chauncey Reading, RN 03/23/2017, 12:06 PM

## 2017-03-23 NOTE — Progress Notes (Signed)
Pt went to bathroom without O2 and became very SOB. O2 placed and pt given scheduled nebulizer treatment. Pt feeling better. Extra O2 tubing added so pt can wear O2 to bathroom for future trips

## 2017-03-23 NOTE — NC FL2 (Signed)
[place] LEVEL OF CARE SCREENING TOOL     IDENTIFICATION  Patient Name: Theresa Garcia Birthdate: [birth year] Sex: female Admission Date (Current Location): 03/18/2017  [facility] and [place] Number:  [employer] and Address:  [place] [address], [place]      Provider Number: [id number]  Attending Physician Name and Address:  Louellen Molder, MD  Relative Name and Phone Number:       Current Level of Care: Hospital Recommended Level of Care: [place] Prior Approval Number:    Date Approved/Denied:   PASRR Number:    Discharge Plan: SNF    Current Diagnoses: Patient Active Problem List   Diagnosis Date Noted  . Diarrhea 03/19/2017  . Acute CHF (congestive heart failure) ([place]) 03/19/2017  . Acute Bil pulmonary embolism ([place]) 03/19/2017  . Hypoxic Respiratory failure, acute ([place]) 03/19/2017  . Epigastric pain   . Fatigue 05/31/2016  . Unsteady gait 05/31/2016  . Annual physical exam 07/24/2015  . GERD (gastroesophageal reflux disease) 04/27/2015  . Depression with anxiety 11/27/2013  . Type 2 HSV infection of vulvovaginal region 03/12/2012  . Nausea & vomiting 01/06/2011  . History of breast cancer in female 01/13/2010  . Hyperlipidemia 01/13/2010  . Essential hypertension 01/13/2010  . Insomnia 01/13/2010    Orientation RESPIRATION BLADDER Height & Weight     Self, Time, Situation, Place  Normal Continent Weight: 198 lb 9.6 oz (90.1 kg) Height:  5\' 7"  (170.2 cm)  BEHAVIORAL SYMPTOMS/MOOD NEUROLOGICAL BOWEL NUTRITION STATUS      Continent (Heart Healthy)  AMBULATORY STATUS COMMUNICATION OF NEEDS Skin   Limited Assist Verbally Normal                       Personal Care Assistance Level of Assistance  Bathing, Feeding, Dressing Bathing Assistance: Limited assistance Feeding assistance: Independent Dressing Assistance: Limited assistance     Functional Limitations Info   Sight, Hearing, Speech Sight Info: Adequate Hearing Info: Adequate Speech Info: Adequate    SPECIAL CARE FACTORS FREQUENCY  PT (By licensed PT)     PT Frequency: 5x/week              Contractures Contractures Info: Not present    Additional Factors Info  Code Status, Allergies Code Status Info: Full Code Allergies Info: Meclizine, Penicillins           Current Medications (03/23/2017):  This is the current hospital active medication list Current Facility-Administered Medications  Medication Dose Route Frequency Provider Last Rate Last Dose  . 0.9 %  sodium chloride infusion  250 mL Intravenous PRN Opyd, Ilene Qua, MD      . acetaminophen (TYLENOL) tablet 650 mg  650 mg Oral Q4H PRN Opyd, Ilene Qua, MD   650 mg at 03/22/17 0036  . acyclovir (ZOVIRAX) tablet 400 mg  400 mg Oral BID Opyd, Ilene Qua, MD   400 mg at 03/23/17 0800  . ALPRAZolam Duanne Moron) tablet 0.25 mg  0.25 mg Oral BID PRN Vianne Bulls, MD   0.25 mg at 03/21/17 2219  . alum & mag hydroxide-simeth (MAALOX/MYLANTA) 200-200-20 MG/5ML suspension 15 mL  15 mL Oral Q4H PRN Dhungel, Nishant, MD   15 mL at 03/22/17 2021  . apixaban (ELIQUIS) tablet 10 mg  10 mg Oral BID Dhungel, Nishant, MD   10 mg at 03/23/17 0802   Followed by  . [START ON 03/29/2017] apixaban (ELIQUIS) tablet 5 mg  5  mg Oral BID Dhungel, Nishant, MD      . atorvastatin (LIPITOR) tablet 20 mg  20 mg Oral QPM Opyd, Ilene Qua, MD   20 mg at 03/22/17 1738  . dextromethorphan-guaiFENesin (MUCINEX DM) 30-600 MG per 12 hr tablet 1 tablet  1 tablet Oral BID Dhungel, Nishant, MD   1 tablet at 03/23/17 0802  . FLUoxetine (PROZAC) capsule 10 mg  10 mg Oral q morning - 10a Opyd, Ilene Qua, MD   10 mg at 03/23/17 0801  . guaiFENesin-dextromethorphan (ROBITUSSIN DM) 100-10 MG/5ML syrup 5 mL  5 mL Oral Q4H PRN Dhungel, Nishant, MD      . HYDROmorphone (DILAUDID) injection 0.5-1 mg  0.5-1 mg Intravenous Q4H PRN Opyd, Ilene Qua, MD   1 mg at 03/23/17 0800  .  levalbuterol (XOPENEX) nebulizer solution 0.63 mg  0.63 mg Nebulization Q6H Dhungel, Nishant, MD   0.63 mg at 03/23/17 0736  . meclizine (ANTIVERT) tablet 25 mg  25 mg Oral TID PRN Opyd, Ilene Qua, MD   25 mg at 03/21/17 1004  . MEDLINE mouth rinse  15 mL Mouth Rinse BID [place] Brick, Courage, MD   15 mL at 03/21/17 2202  . ondansetron (ZOFRAN) injection 4 mg  4 mg Intravenous Q6H PRN Opyd, Ilene Qua, MD   4 mg at 03/21/17 1204  . potassium chloride (K-DUR,KLOR-CON) CR tablet 10 mEq  10 mEq Oral BID Opyd, Ilene Qua, MD   10 mEq at 03/23/17 0801  . sodium chloride flush (NS) 0.9 % injection 3 mL  3 mL Intravenous Q12H Opyd, Ilene Qua, MD   3 mL at 03/23/17 0943  . sodium chloride flush (NS) 0.9 % injection 3 mL  3 mL Intravenous PRN Opyd, Ilene Qua, MD      . temazepam (RESTORIL) capsule 30 mg  30 mg Oral QHS PRN Opyd, Ilene Qua, MD   30 mg at 03/22/17 2118     Discharge Medications: Please see discharge summary for a list of discharge medications.  Relevant Imaging Results:  Relevant Lab Results:   Additional Information SSN [id number] Clydene Pugh, LCSW

## 2017-03-23 NOTE — Progress Notes (Signed)
Physical Therapy Treatment Patient Details Name: Theresa Garcia MRN: [id number] DOB: 1940-08-28 Today's Date: 03/23/2017    History of Present Illness Theresa Garcia is a 77yo black female who comes to [facility] on 03/19/17 d/t increased ABD discomfort and SOB. PMH: HTN, insomnia, and chornic LEE. CT angiogram (+) for bilat PE on 1/25.     PT Comments    Patient demonstrates slow labored movement for sitting up at bedside and limited for gait training due fatigue and mild lethargy after taking pain medication.  Patient had most difficulty with sit to stands due to BLE weakness, requiring use of bed rail and RW to stand and declined to sit up in chair after therapy due to fatigue/sleepiness.  Patient will benefit from continued physical therapy in hospital and recommended venue below to increase strength, balance, endurance for safe ADLs and gait.   Follow Up Recommendations  SNF;Supervision/Assistance - 24 hour     Equipment Recommendations  Rolling walker with 5" wheels    Recommendations for Other Services       Precautions / Restrictions Precautions Precautions: Fall Restrictions Weight Bearing Restrictions: No    Mobility  Bed Mobility   Bed Mobility: Supine to Sit;Sit to Supine     Supine to sit: Supervision        Transfers Overall transfer level: Needs assistance Equipment used: Rolling walker (2 wheeled);None Transfers: Sit to/from Stand Sit to Stand: Min assist         General transfer comment: requires multiple attempts to sit to stand from bedside without use of RW  Ambulation/Gait Ambulation/Gait assistance: Min assist Ambulation Distance (Feet): 20 Feet Assistive device: Rolling walker (2 wheeled) Gait Pattern/deviations: Decreased step length - left;Decreased step length - right;Decreased stride length   Gait velocity interpretation: Below normal speed for age/gender General Gait Details: demonstrates slow labored cadence, limited secondary to c/o  fatigue, on room air with O2 sats maintaining between 92-93%   Stairs            Wheelchair Mobility    Modified Rankin (Stroke Patients Only)       Balance Overall balance assessment: Needs assistance Sitting-balance support: No upper extremity supported;Feet supported Sitting balance-Leahy Scale: Good     Standing balance support: Bilateral upper extremity supported;During functional activity Standing balance-Leahy Scale: Fair Standing balance comment: using RW                            Cognition Arousal/Alertness: Awake/alert Behavior During Therapy: WFL for tasks assessed/performed Overall Cognitive Status: Within Functional Limits for tasks assessed                                        Exercises General Exercises - Lower Extremity Long Arc Quad: Seated;AROM;Strengthening;Both;10 reps Hip Flexion/Marching: Seated;AROM;Strengthening;Both;10 reps Toe Raises: Seated;AROM;Strengthening;Both Heel Raises: Seated;AROM;Strengthening;Both;10 reps    General Comments        Pertinent Vitals/Pain Pain Assessment: Faces Faces Pain Scale: Hurts a little bit Pain Location: Right shoulder, insidious and acute Pain Descriptors / Indicators: Aching Pain Intervention(s): Limited activity within patient's tolerance;Monitored during session;Premedicated before session    Home Living                      Prior Function            PT Goals (current goals can now be  found in the care plan section) Acute Rehab PT Goals Patient Stated Goal: return home PT Goal Formulation: With patient Time For Goal Achievement: 04/05/17 Potential to Achieve Goals: Good Progress towards PT goals: Progressing toward goals    Frequency    Min 3X/week      PT Plan Current plan remains appropriate    Co-evaluation              AM-PAC PT "6 Clicks" Daily Activity  Outcome Measure  Difficulty turning over in bed (including adjusting  bedclothes, sheets and blankets)?: None Difficulty moving from lying on back to sitting on the side of the bed? : None Difficulty sitting down on and standing up from a chair with arms (e.g., wheelchair, bedside commode, etc,.)?: A Lot Help needed moving to and from a bed to chair (including a wheelchair)?: A Little Help needed walking in hospital room?: A Little Help needed climbing 3-5 steps with a railing? : A Lot 6 Click Score: 18    End of Session Equipment Utilized During Treatment: Gait belt Activity Tolerance: Patient limited by fatigue Patient left: in bed;with call bell/phone within reach;with family/visitor present Nurse Communication: Mobility status PT Visit Diagnosis: Unsteadiness on feet (R26.81);Muscle weakness (generalized) (M62.81);Difficulty in walking, not elsewhere classified (R26.2)     Time: 1022-1050 PT Time Calculation (min) (ACUTE ONLY): 28 min  Charges:  $Therapeutic Activity: 23-37 mins                    G Codes:       1:27 PM, 04/15/2017 Theresa Garcia, MPT Physical Therapist with [facility] [phone] office [phone] mobile phone

## 2017-03-23 NOTE — Clinical Social Work Note (Signed)
Clinical Social Work Assessment  Patient Details  Name: Theresa Garcia MRN: [id number] Date of Birth: [birth year]  Date of referral:  03/23/17               Reason for consult:  Facility Placement                Permission sought to share information with:    Permission granted to share information::     Name::        Agency::     Relationship::     Contact Information:  sisters Hassan Rowan Parx and Arnettie  Housing/Transportation Living arrangements for the past 2 months:  [place], [place] of Information:  Patient, Siblings Patient Interpreter Needed:  None Criminal Activity/Legal Involvement Pertinent to Current Situation/Hospitalization:  No - Comment as needed Significant Relationships:  Siblings Lives with:  Siblings Do you feel safe going back to the place where you live?  Yes Need for family participation in patient care:  Yes (Comment)  Care giving concerns:  None identified.   Social Worker assessment / plan:  Patient reports that at baseline, she is totally independent in ADLs, ambulation and drives.  She resides in the home with her sister, Arnettie. Patient is agreeable to short term SNF for rehab purposes.   Employment status:  Retired Nurse, adult PT Recommendations:  [place] / Referral to community resources:  [place]  Patient/Family's Response to care:  Patient is agreeable to SNF.   Patient/Family's Understanding of and Emotional Response to Diagnosis, Current Treatment, and Prognosis:  Patient and family understand patient's diagnosis, treatment and prognosis.   Emotional Assessment Appearance:  Appears stated age Attitude/Demeanor/Rapport:    Affect (typically observed):  Accepting, Calm Orientation:  Oriented to Self, Oriented to Place, Oriented to  Time, Oriented to Situation Alcohol / Substance use:  Not Applicable Psych involvement (Current and  /or in the community):  No (Comment)  Discharge Needs  Concerns to be addressed:  Discharge Planning Concerns Readmission within the last 30 days:  No Current discharge risk:  None Barriers to Discharge:  No Barriers Identified   Ihor Gully, LCSW 03/23/2017, 12:31 PM

## 2017-03-24 ENCOUNTER — Inpatient Hospital Stay
Admission: RE | Admit: 2017-03-24 | Discharge: 2017-04-07 | Disposition: A | Discharge status: Home / Self Care | Payer: Medicare HMO | Source: Ambulatory Visit | Attending: Internal Medicine | Admitting: Internal Medicine

## 2017-03-24 DIAGNOSIS — F418 Other specified anxiety disorders: Secondary | ICD-10-CM | POA: Diagnosis not present

## 2017-03-24 DIAGNOSIS — K219 Gastro-esophageal reflux disease without esophagitis: Secondary | ICD-10-CM | POA: Diagnosis not present

## 2017-03-24 DIAGNOSIS — R42 Dizziness and giddiness: Secondary | ICD-10-CM | POA: Diagnosis not present

## 2017-03-24 DIAGNOSIS — G8929 Other chronic pain: Secondary | ICD-10-CM | POA: Diagnosis not present

## 2017-03-24 DIAGNOSIS — Z853 Personal history of malignant neoplasm of breast: Secondary | ICD-10-CM | POA: Diagnosis not present

## 2017-03-24 DIAGNOSIS — R600 Localized edema: Secondary | ICD-10-CM | POA: Diagnosis not present

## 2017-03-24 DIAGNOSIS — I10 Essential (primary) hypertension: Secondary | ICD-10-CM | POA: Diagnosis not present

## 2017-03-24 DIAGNOSIS — E785 Hyperlipidemia, unspecified: Secondary | ICD-10-CM | POA: Diagnosis not present

## 2017-03-24 DIAGNOSIS — I2699 Other pulmonary embolism without acute cor pulmonale: Secondary | ICD-10-CM | POA: Diagnosis not present

## 2017-03-24 DIAGNOSIS — G4700 Insomnia, unspecified: Secondary | ICD-10-CM | POA: Diagnosis not present

## 2017-03-24 DIAGNOSIS — R69 Illness, unspecified: Secondary | ICD-10-CM | POA: Diagnosis not present

## 2017-03-24 DIAGNOSIS — M6281 Muscle weakness (generalized): Secondary | ICD-10-CM | POA: Diagnosis not present

## 2017-03-24 DIAGNOSIS — R262 Difficulty in walking, not elsewhere classified: Secondary | ICD-10-CM | POA: Diagnosis not present

## 2017-03-24 DIAGNOSIS — E7849 Other hyperlipidemia: Secondary | ICD-10-CM | POA: Diagnosis not present

## 2017-03-24 DIAGNOSIS — R2681 Unsteadiness on feet: Secondary | ICD-10-CM | POA: Diagnosis not present

## 2017-03-24 DIAGNOSIS — J9601 Acute respiratory failure with hypoxia: Secondary | ICD-10-CM | POA: Diagnosis not present

## 2017-03-24 DIAGNOSIS — J810 Acute pulmonary edema: Secondary | ICD-10-CM | POA: Diagnosis not present

## 2017-03-24 DIAGNOSIS — A6004 Herpesviral vulvovaginitis: Secondary | ICD-10-CM | POA: Diagnosis not present

## 2017-03-24 LAB — CULTURE, BLOOD (ROUTINE X 2)
Culture: NO GROWTH
Culture: NO GROWTH
Special Requests: ADEQUATE
Special Requests: ADEQUATE

## 2017-03-24 LAB — BASIC METABOLIC PANEL WITH GFR
Anion gap: 13 (ref 5–15)
BUN: 9 mg/dL (ref 6–20)
CO2: 24 mmol/L (ref 22–32)
Calcium: 9.6 mg/dL (ref 8.9–10.3)
Chloride: 101 mmol/L (ref 101–111)
Creatinine, Ser: 0.74 mg/dL (ref 0.44–1.00)
GFR calc Af Amer: >60 mL/min (ref >60)
GFR calc non Af Amer: >60 mL/min (ref >60)
Glucose, Bld: 102 mg/dL — ABNORMAL HIGH (ref 65–99)
Potassium: 4.3 mmol/L (ref 3.5–5.1)
Sodium: 138 mmol/L (ref 135–145)

## 2017-03-24 LAB — CBC
HCT: 37.7 % (ref 36.0–46.0)
Hemoglobin: 12.1 g/dL (ref 12.0–15.0)
MCH: 31.3 pg (ref 26.0–34.0)
MCHC: 32.1 g/dL (ref 30.0–36.0)
MCV: 97.4 fL (ref 78.0–100.0)
Platelets: 451 10*3/uL — ABNORMAL HIGH (ref 150–400)
RBC: 3.87 MIL/uL (ref 3.87–5.11)
RDW: 13 % (ref 11.5–15.5)
WBC: 10.4 10*3/uL (ref 4.0–10.5)

## 2017-03-24 MED ORDER — TEMAZEPAM 30 MG PO CAPS: 30.0000 mg | ORAL_CAPSULE | Freq: Every evening | ORAL | 0 refills | Status: DC | PRN

## 2017-03-24 MED ORDER — APIXABAN 5 MG PO TABS: 10.0000 mg | ORAL_TABLET | Freq: Two times a day (BID) | ORAL | 0 refills | Status: DC

## 2017-03-24 MED ORDER — ALBUTEROL SULFATE (2.5 MG/3ML) 0.083% IN NEBU: 2.5000 mg | INHALATION_SOLUTION | Freq: Four times a day (QID) | RESPIRATORY_TRACT | 0 refills | Status: DC | PRN

## 2017-03-24 MED ORDER — SENNOSIDES-DOCUSATE SODIUM 8.6-50 MG PO TABS: 2.0000 | ORAL_TABLET | Freq: Every evening | ORAL | 0 refills | Status: DC | PRN

## 2017-03-24 MED ORDER — DM-GUAIFENESIN ER 30-600 MG PO TB12: 1.0000 | ORAL_TABLET | Freq: Two times a day (BID) | ORAL | 0 refills | Status: DC

## 2017-03-24 NOTE — Clinical Social Work Placement (Signed)
   CLINICAL SOCIAL WORK PLACEMENT  NOTE  Date:  03/24/2017  Patient Details  Name: Theresa Garcia MRN: [id number] Date of Birth: 07/05/1940  Clinical Social Work is seeking post-discharge placement for this patient at the [place] level of care (*CSW will initial, date and re-position this form in  chart as items are completed):  Yes   Patient/family provided with [place] Work Department's list of facilities offering this level of care within the geographic area requested by the patient (or if unable, by the patient's family).  Yes   Patient/family informed of their freedom to choose among providers that offer the needed level of care, that participate in Medicare, Medicaid or managed care program needed by the patient, have an available bed and are willing to accept the patient.  Yes   Patient/family informed of [place]'s ownership interest in [facility] and [facility], as well as of the fact that they are under no obligation to receive care at these facilities.  PASRR submitted to [employer] on 03/23/17     PASRR number received on 03/24/17     Existing PASRR number confirmed on       FL2 transmitted to all facilities in geographic area requested by pt/family on 03/23/17     FL2 transmitted to all facilities within larger geographic area on       Patient informed that his/her managed care company has contracts with or will negotiate with certain facilities, including the following:        Yes   Patient/family informed of bed offers received.  Patient chooses bed at [facility]     Physician recommends and patient chooses bed at      Patient to be transferred to [facility] on  .  Patient to be transferred to facility by [facility] staff      Patient family notified on 03/24/17 of transfer.  Name of family member notified:  Message left for Arnettie, sister     PHYSICIAN       Additional Comment:  Facility  notified and discharge clinicals sent.    _______________________________________________ Ihor Gully, LCSW 03/24/2017, 2:40 PM

## 2017-03-24 NOTE — Progress Notes (Signed)
Theresa Garcia discharged to the [facility] per MD order.  Discharge instructions reviewed and discussed with the patient, all questions and concerns answered. Copy of instructions and scripts given to patient's family. Report called to [place], LPN at the [facility].  Allergies as of 03/24/2017      Reactions   Meclizine Diarrhea   Penicillins Rash, Other (See Comments)   Has patient had a PCN reaction causing immediate rash, facial/tongue/throat swelling, SOB or lightheadedness with hypotension: Yes Has patient had a PCN reaction causing severe rash involving mucus membranes or skin necrosis: No Has patient had a PCN reaction that required hospitalization No Has patient had a PCN reaction occurring within the last 10 years: No If all of the above answers are "NO", then may proceed with Cephalosporin use.      Medication List    STOP taking these medications   aspirin 81 MG tablet     TAKE these medications   acyclovir 400 MG tablet Commonly known as:  ZOVIRAX TAKE 1 TABLET BY MOUTH TWICE DAILY   albuterol (2.5 MG/3ML) 0.083% nebulizer solution Commonly known as:  PROVENTIL Take 3 mLs (2.5 mg total) by nebulization every 6 (six) hours as needed for wheezing or shortness of breath.   amLODipine-benazepril 5-40 MG capsule Commonly known as:  LOTREL TAKE 1 CAPSULE DAILY   apixaban 5 MG Tabs tablet Commonly known as:  ELIQUIS Take 2 tablets (10 mg total) by mouth 2 (two) times daily. Take 10 mg twice daily for 5 days , followed by 5 mg twice daily starting 03/29/2017)   atorvastatin 20 MG tablet Commonly known as:  LIPITOR TAKE 1 TABLET EVERY EVENING   dextromethorphan-guaiFENesin 30-600 MG 12hr tablet Commonly known as:  MUCINEX DM Take 1 tablet by mouth 2 (two) times daily.   Fluoxetine HCl (PMDD) 10 MG Tabs One tablet daily What changed:    how much to take  how to take this  when to take this  additional instructions   furosemide 20 MG tablet Commonly known  as:  LASIX One tablet twice weekly as needed for ankle swelling What changed:    how much to take  how to take this  when to take this  additional instructions   potassium chloride 10 MEQ tablet Commonly known as:  KLOR-CON 10 One tablet twice weekly onlyon the days you take the lasix ( fluid pill)   senna-docusate 8.6-50 MG tablet Commonly known as:  Senokot-S Take 2 tablets by mouth at bedtime as needed for mild constipation or moderate constipation.   temazepam 30 MG capsule Commonly known as:  RESTORIL Take 1 capsule (30 mg total) by mouth at bedtime as needed.   TYLENOL 500 MG tablet Generic drug:  acetaminophen Take 1,000 mg by mouth every 6 (six) hours as needed for mild pain or moderate pain.   vitamin C 500 MG tablet Commonly known as:  ASCORBIC ACID Take 500 mg by mouth daily.       Patients skin is clean, dry and intact, no evidence of skin break down. IV site discontinued and catheter remains intact. Site without signs and symptoms of complications. Dressing and pressure applied.  Patient escorted to the [facility] by NT in a wheelchair,  no distress noted upon discharge.  Theresa Garcia 03/24/2017 5:18 PM

## 2017-03-24 NOTE — Care Management Important Message (Signed)
Important Message  Patient Details  Name: Theresa Garcia MRN: [id number] Date of Birth: 12/20/1940   Medicare Important Message Given:  Yes    Sherald Barge, RN 03/24/2017, 2:40 PM

## 2017-03-24 NOTE — Discharge Summary (Addendum)
Physician Discharge Summary  MEILIN BROSH QHU:[id number] DOB: Jun 20, 1940 DOA: 03/18/2017  PCP: Fayrene Helper, MD  Admit date: 03/18/2017 Discharge date: 03/24/2017  Admitted From: home Disposition:  SNF ([facility])  Recommendations for Outpatient Follow-up:  1. Follow up with M.D. At SNF in 1 week. 2. Patient will need to be on anticoagulation for at least 6 months until 09/16/2017 for bilateral PE. 3. Please wean oxygen as tolerated.   Equipment/Devices: oxygen (2 L via nasal cannula)  Discharge Condition: fair CODE STATUS: full code Diet recommendation: regular    Discharge Diagnoses:  Principal Problem:   Acute Bil pulmonary embolism ([place])  Active Problems:   Essential hypertension   Hypoxic Respiratory failure, acute ([place])   Brief narrative/history of present illness Please refer to admission H&P for details, in brief,77 year old female with history of hypertension, history of diastolic CHF ( no longer prevalent), dyslipidemia presented with acute shortness of breath initially admitted with suspicion for CHF but given persistent tachycardia and tachypnea and hypoxia a CT angiogram of the chest was done which showed acute bilateral PE and was transferred to ICU on IV heparin drip.  Hospital course  Principal Problem:   Acute bilateral pulmonary embolism ([address]) -No clear etiology.(except prior history of breast cancer and strong family history for DVTs/PE). -Placed on IV heparin now transitioned to eliquis ( 10 mg bid for 7 days, then 5mg  bid starting 1/28) -2-D echo with normal EF of 55 and 60% with no right heart strain.  She will need at least 6 months of anticoagulation. (at least until 09/16/2017). -Lower extremity Doppler negative for DVT.   Acute respiratory failure with hypoxia (H[facility]) Secondary to bilateral PE. Now requiring 2 L via nasal cannula. Symptoms much better today. Continue when necessary albuterol neb for dyspnea and wheezing. Wean oxygen as  tolerated.  Active Problems:  History of diastolic CHF asymptomatic and not active at present. 2d echo with normal EF , and no diastolic dysfunction.  Essential hypertension. Hypotensive associated with pulmonary embolism. Blood pressure medications were held for a few days and now resumed.  Dyslipidemia Continue statin.  Physical deconditioning Worsened by her current PE. PT recommends SNF  Fever on 1/29 Likely secondary to clot burden. Chest x-ray negative for infiltrate. Fever and tachycardia currently resolved.  Elevated troponin Demand ischemia due to acute clot burden.  Family Communication  : discussed with sister on the phone   Consults  :  None  Procedures  :  CT angiogram of the chest 2-D echo  Doppler lower extremities    Discharge Instructions   Allergies as of 03/24/2017      Reactions   Meclizine Diarrhea   Penicillins Rash, Other (See Comments)   Has patient had a PCN reaction causing immediate rash, facial/tongue/throat swelling, SOB or lightheadedness with hypotension: Yes Has patient had a PCN reaction causing severe rash involving mucus membranes or skin necrosis: No Has patient had a PCN reaction that required hospitalization No Has patient had a PCN reaction occurring within the last 10 years: No If all of the above answers are "NO", then may proceed with Cephalosporin use.      Medication List    STOP taking these medications   aspirin 81 MG tablet     TAKE these medications   acyclovir 400 MG tablet Commonly known as:  ZOVIRAX TAKE 1 TABLET BY MOUTH TWICE DAILY   albuterol (2.5 MG/3ML) 0.083% nebulizer solution Commonly known as:  PROVENTIL Take 3 mLs (2.5 mg total) by  nebulization every 6 (six) hours as needed for wheezing or shortness of breath.   amLODipine-benazepril 5-40 MG capsule Commonly known as:  LOTREL TAKE 1 CAPSULE DAILY   apixaban 5 MG Tabs tablet Commonly known as:  ELIQUIS Take 2 tablets (10 mg total)  by mouth 2 (two) times daily. Take 10 mg twice daily for 5 days , followed by 5 mg twice daily starting 03/29/2017)   atorvastatin 20 MG tablet Commonly known as:  LIPITOR TAKE 1 TABLET EVERY EVENING   dextromethorphan-guaiFENesin 30-600 MG 12hr tablet Commonly known as:  MUCINEX DM Take 1 tablet by mouth 2 (two) times daily.   Fluoxetine HCl (PMDD) 10 MG Tabs One tablet daily What changed:    how much to take  how to take this  when to take this  additional instructions   furosemide 20 MG tablet Commonly known as:  LASIX One tablet twice weekly as needed for ankle swelling What changed:    how much to take  how to take this  when to take this  additional instructions   potassium chloride 10 MEQ tablet Commonly known as:  KLOR-CON 10 One tablet twice weekly onlyon the days you take the lasix ( fluid pill)   senna-docusate 8.6-50 MG tablet Commonly known as:  Senokot-S Take 2 tablets by mouth at bedtime as needed for mild constipation or moderate constipation.   temazepam 30 MG capsule Commonly known as:  RESTORIL Take 1 capsule (30 mg total) by mouth at bedtime as needed.   TYLENOL 500 MG tablet Generic drug:  acetaminophen Take 1,000 mg by mouth every 6 (six) hours as needed for mild pain or moderate pain.   vitamin C 500 MG tablet Commonly known as:  ASCORBIC ACID Take 500 mg by mouth daily.      Contact information for after-discharge care    [facility] Follow up.   Service:  Skilled Nursing Contact information: [address] [address] [phone]             Allergies  Allergen Reactions  . Meclizine Diarrhea  . Penicillins Rash and Other (See Comments)    Has patient had a PCN reaction causing immediate rash, facial/tongue/throat swelling, SOB or lightheadedness with hypotension: Yes Has patient had a PCN reaction causing severe rash involving mucus membranes or skin necrosis:  No Has patient had a PCN reaction that required hospitalization No Has patient had a PCN reaction occurring within the last 10 years: No If all of the above answers are "NO", then may proceed with Cephalosporin use.       Procedures/Studies: Ct Angio Chest Pe W Or Wo Contrast  Result Date: 03/19/2017 CLINICAL DATA:  Shortness of breath. EXAM: CT ANGIOGRAPHY CHEST WITH CONTRAST TECHNIQUE: Multidetector CT imaging of the chest was performed using the standard protocol during bolus administration of intravenous contrast. Multiplanar CT image reconstructions and MIPs were obtained to evaluate the vascular anatomy. CONTRAST:  52mL ISOVUE-370 IOPAMIDOL (ISOVUE-370) INJECTION 76% COMPARISON:  Chest radiograph 03/18/2017 FINDINGS: Cardiovascular: Bilateral pulmonary emboli. The most central pulmonary embolus is within the distal main right pulmonary artery, and extends to the bifurcation of the right lower lobar and right middle lobar pulmonary arteries. Small portion of the embolus is seen at the takeoff of the right upper lobar pulmonary artery is well. The left-sided pulmonary emboli are within the left upper lobar pulmonary artery and segmental branches of the left lower lobar pulmonary artery. There is  evidence of right heart strain with RV/LV ratio of 1.3. Mediastinum/Nodes: No enlarged mediastinal, hilar, or axillary lymph nodes. Thyroid gland, trachea, and esophagus demonstrate no significant findings. Lungs/Pleura: Right lower lobe atelectasis versus peribronchial airspace consolidation. Mild right middle lobe atelectasis. Small right pleural effusion. Minimal left lower lobe atelectasis. Upper Abdomen: No acute abnormality. Musculoskeletal: Postsurgical changes in the left chest wall/axilla. No evidence of fracture or significant osseous abnormality. Exaggeration of thoracic kyphosis with mild osteoarthritic changes. Review of the MIP images confirms the above findings. IMPRESSION: Bilateral pulmonary  emboli. The most central pulmonary embolus is within the distal right main pulmonary artery and extends to the bifurcation of the right lower and right middle lobar pulmonary arteries, where it becomes nearly occlusive. The left-sided pulmonary emboli are within the left upper lobar pulmonary artery and segmental branches of the left lower lobar pulmonary artery. Evidence of right heart strain with RV/LV ratio of 1.3 Right lower lobar atelectasis versus peribronchial airspace consolidation. Small right pleural effusion. Minimal left lower lobar atelectasis. These results were called by telephone at the time of interpretation on 03/19/2017 at 11:40 am to Dr. Roxan Hockey , who verbally acknowledged these results. Electronically Signed   By: Fidela Salisbury M.D.   On: 03/19/2017 11:43   Ct Abdomen Pelvis W Contrast  Result Date: 03/18/2017 CLINICAL DATA:  Abdominal discomfort, lower chest pain, watery diarrhea EXAM: CT ABDOMEN AND PELVIS WITH CONTRAST TECHNIQUE: Multidetector CT imaging of the abdomen and pelvis was performed using the standard protocol following bolus administration of intravenous contrast. CONTRAST:  189mL ISOVUE-300 IOPAMIDOL (ISOVUE-300) INJECTION 61% COMPARISON:  04/26/2015 FINDINGS: Lower chest: Lung bases demonstrate small right-sided pleural effusion with streaky atelectasis or infiltrate at the right base. Cardiomegaly. Hepatobiliary: Mild steatosis. No calcified gallstones or biliary dilatation. Gallbladder is contracted Pancreas: Unremarkable. No pancreatic ductal dilatation or surrounding inflammatory changes. Spleen: Normal in size without focal abnormality. Adrenals/Urinary Tract: Adrenal glands are within normal limits. No hydronephrosis. Cyst in the mid right kidney. Additional subcentimeter hypodensities too small to further characterize. The bladder is normal Stomach/Bowel: Stomach is within normal limits. Appendix appears normal. No evidence of bowel wall thickening,  distention, or inflammatory changes. Sigmoid colon scattered diverticula without acute inflammation Vascular/Lymphatic: Mild aortic atherosclerosis. No aneurysmal dilatation. No significantly enlarged lymph nodes Reproductive: Status post hysterectomy. No adnexal masses. Other: Negative for free air or free fluid. Musculoskeletal: Degenerative changes. No acute or suspicious lesion. IMPRESSION: 1. Negative for bowel obstruction or bowel wall thickening 2. Mild hepatic steatosis 3. Small right pleural effusion with streaky atelectasis or infiltrate at the right base. Electronically Signed   By: Donavan Foil M.D.   On: 03/18/2017 23:14   US Venous Img Lower Bilateral  Result Date: 03/20/2017 CLINICAL DATA:  77 year old female with a history bilateral pulmonary embolism EXAM: BILATERAL LOWER EXTREMITY VENOUS DOPPLER ULTRASOUND TECHNIQUE: Gray-scale sonography with graded compression, as well as color Doppler and duplex ultrasound were performed to evaluate the lower extremity deep venous systems from the level of the common femoral vein and including the common femoral, femoral, profunda femoral, popliteal and calf veins including the posterior tibial, peroneal and gastrocnemius veins when visible. The superficial great saphenous vein was also interrogated. Spectral Doppler was utilized to evaluate flow at rest and with distal augmentation maneuvers in the common femoral, femoral and popliteal veins. COMPARISON:  None. FINDINGS: RIGHT LOWER EXTREMITY Common Femoral Vein: No evidence of thrombus. Normal compressibility, respiratory phasicity and response to augmentation. Saphenofemoral Junction: No evidence of thrombus. Normal compressibility  and flow on color Doppler imaging. Profunda Femoral Vein: No evidence of thrombus. Normal compressibility and flow on color Doppler imaging. Femoral Vein: No evidence of thrombus. Normal compressibility, respiratory phasicity and response to augmentation. Popliteal Vein: No  evidence of thrombus. Normal compressibility, respiratory phasicity and response to augmentation. Calf Veins: No evidence of thrombus. Normal compressibility and flow on color Doppler imaging. Superficial Great Saphenous Vein: No evidence of thrombus. Normal compressibility and flow on color Doppler imaging. Other Findings:  None. LEFT LOWER EXTREMITY Common Femoral Vein: No evidence of thrombus. Normal compressibility, respiratory phasicity and response to augmentation. Saphenofemoral Junction: No evidence of thrombus. Normal compressibility and flow on color Doppler imaging. Profunda Femoral Vein: No evidence of thrombus. Normal compressibility and flow on color Doppler imaging. Femoral Vein: No evidence of thrombus. Normal compressibility, respiratory phasicity and response to augmentation. Popliteal Vein: No evidence of thrombus. Normal compressibility, respiratory phasicity and response to augmentation. Calf Veins: No evidence of thrombus. Normal compressibility and flow on color Doppler imaging. Superficial Great Saphenous Vein: No evidence of thrombus. Normal compressibility and flow on color Doppler imaging. Other Findings:  None. IMPRESSION: Sonographic survey of the bilateral lower extremities negative for DVT Electronically Signed   By: Corrie Mckusick D.O.   On: 03/20/2017 14:37   Dg Chest Port 1 View  Result Date: 03/22/2017 CLINICAL DATA:  Shortness of breath. History of hypertension. Ex-smoker. EXAM: PORTABLE CHEST 1 VIEW COMPARISON:  CT 03/19/2017.  Radiographs 03/18/2017. FINDINGS: The heart size and mediastinal contours stable. No significant change in bibasilar pulmonary opacities and bilateral pleural effusions. There is no edema or pneumothorax. Surgical clips are present in the left axilla. No acute osseous findings. IMPRESSION: No significant change in bibasilar atelectasis and small bilateral pleural effusions. Known pulmonary embolism on recent CT. Electronically Signed   By: Richardean Sale M.D.   On: 03/22/2017 10:24   Dg Chest Port 1 View  Result Date: 03/18/2017 CLINICAL DATA:  Upper abdominal pain onset tonight with diarrhea. EXAM: PORTABLE CHEST 1 VIEW COMPARISON:  None. FINDINGS: Heart is enlarged. Aorta is nonaneurysmal. Atherosclerotic calcifications are seen at the aortic arch. Lung volumes are low. There is pulmonary vascular congestion with probable small left and tiny right effusions. Surgical clips project over the left axilla with small focus of air within the soft tissues of the left axilla possibly related to intervention or air trapped within skin fold. Small focus of soft tissue infection is not entirely excluded. No acute osseous abnormality. IMPRESSION: 1. Low lung volumes with cardiomegaly and CHF. Small left and tiny right effusions. 2. Small rounded focus of air in the soft tissues of the left axilla, nonspecific. Differential possibilities given above. Electronically Signed   By: Ashley Royalty M.D.   On: 03/18/2017 21:17    2-D echo  Study Conclusions  - Left ventricle: The cavity size was normal. Wall thickness was   increased in a pattern of moderate LVH. Systolic function was   normal. The estimated ejection fraction was in the range of 55%   to 60%. Wall motion was normal; there were no regional wall   motion abnormalities. The study is not technically sufficient to   allow evaluation of LV diastolic function. - Aortic valve: Mildly calcified annulus. Trileaflet; mildly   thickened leaflets. - Mitral valve: Mildly calcified annulus. Mildly thickened leaflets   . There was mild regurgitation. - Right ventricle: The cavity size was moderately to severely   dilated. Systolic function was normal. TAPSE: 17.5 mm .  Lateral   annulus peak S velocity: 14.6 cm/s. - Tricuspid valve: There was moderate regurgitation. - Pulmonary arteries: Systolic pressure was moderately increased.   PA peak pressure: 43 mm Hg (S). - Technically difficult  study.  Subjective: Feels much better today. Shortness of breath improved, heart race stable on the monitor and afebrile.  Discharge Exam: Vitals:   03/24/17 0424 03/24/17 0758  BP: 133/80   Pulse: 84   Resp: 17   Temp: 98.6 F (37 C)   SpO2: 94% 94%   Vitals:   03/23/17 2008 03/23/17 2035 03/24/17 0424 03/24/17 0758  BP:  133/73 133/80   Pulse:  91 84   Resp:  18 17   Temp:  98.3 F (36.8 C) 98.6 F (37 C)   TempSrc:  Oral Oral   SpO2: 98% 96% 94% 94%  Weight:   91.6 kg (201 lb 15.1 oz)   Height:        General: elderly female appears fatigued, not in distress HEENT: Moist mucosa, supple neck Chest: clear to auscultation bilaterally CVS: S1 and S2 normal, no murmurs GI: Soft, nondistended, nontender Musculoskeletal: Warm, no edema       The results of significant diagnostics from this hospitalization (including imaging, microbiology, ancillary and laboratory) are listed below for reference.     Microbiology: Recent Results (from the past 240 hour(s))  Culture, blood (Routine X 2) w Reflex to ID Panel     Status: None   Collection Time: 03/19/17  8:44 AM  Result Value Ref Range Status   Specimen Description BLOOD LEFT ARM  Final   Special Requests   Final    BOTTLES DRAWN AEROBIC AND ANAEROBIC Blood Culture adequate volume   Culture NO GROWTH 5 DAYS  Final   Report Status 03/24/2017 FINAL  Final  Culture, blood (Routine X 2) w Reflex to ID Panel     Status: None   Collection Time: 03/19/17  8:44 AM  Result Value Ref Range Status   Specimen Description BLOOD LEFT HAND  Final   Special Requests   Final    BOTTLES DRAWN AEROBIC AND ANAEROBIC Blood Culture adequate volume   Culture NO GROWTH 5 DAYS  Final   Report Status 03/24/2017 FINAL  Final  MRSA PCR Screening     Status: None   Collection Time: 03/19/17 12:45 PM  Result Value Ref Range Status   MRSA by PCR NEGATIVE NEGATIVE Final    Comment:        The GeneXpert MRSA Assay (FDA approved for NASAL  specimens only), is one component of a comprehensive MRSA colonization surveillance program. It is not intended to diagnose MRSA infection nor to guide or monitor treatment for MRSA infections.      Labs: BNP (last 3 results) Recent Labs    03/18/17 2136  BNP 235.3*   Basic Metabolic Panel: Recent Labs  Lab 03/20/17 0427 03/21/17 0427 03/22/17 0448 03/23/17 0501 03/24/17 0454  NA 138 143 141 139 138  K 3.9 4.1 4.0 3.8 4.3  CL 105 110 109 104 101  CO2 24 23 23 23 24   GLUCOSE 97 101* 95 101* 102*  BUN 12 8 6 8 9   CREATININE 0.79 0.68 0.62 0.74 0.74  CALCIUM 8.5* 8.9 9.0 9.5 9.6   Liver Function Tests: Recent Labs  Lab 03/18/17 2050  AST 24  ALT 14  ALKPHOS 66  BILITOT 0.8  PROT 8.2*  ALBUMIN 3.9   Recent Labs  Lab 03/18/17 2050  LIPASE  28   No results for input(s): AMMONIA in the last 168 hours. CBC: Recent Labs  Lab 03/18/17 2050 03/19/17 0506 03/20/17 0427 03/21/17 0427 03/22/17 0448 03/23/17 0501 03/24/17 0454  WBC 17.1* 19.3* 13.9* 11.3* 10.4 9.9 10.4  NEUTROABS 11.9* 15.4*  --   --   --   --   --   HGB 14.9 12.9 11.2* 10.5* 10.6* 11.9* 12.1  HCT 44.9 40.2 35.4* 33.5* 33.7* 36.9 37.7  MCV 97.2 97.8 98.6 99.7 99.1 97.4 97.4  PLT 287 298 301 347 335 401* 451*   Cardiac Enzymes: Recent Labs  Lab 03/18/17 2050 03/18/17 2346 03/19/17 0506 03/19/17 0857  TROPONINI 0.04* 0.05* 0.03* 0.03*   BNP: Invalid input(s): POCBNP CBG: No results for input(s): GLUCAP in the last 168 hours. D-Dimer No results for input(s): DDIMER in the last 72 hours. Hgb A1c No results for input(s): HGBA1C in the last 72 hours. Lipid Profile No results for input(s): CHOL, HDL, LDLCALC, TRIG, CHOLHDL, LDLDIRECT in the last 72 hours. Thyroid function studies No results for input(s): TSH, T4TOTAL, T3FREE, THYROIDAB in the last 72 hours.  Invalid input(s): FREET3 Anemia work up No results for input(s): VITAMINB12, FOLATE, FERRITIN, TIBC, IRON, RETICCTPCT in the  last 72 hours. Urinalysis    Component Value Date/Time   COLORURINE STRAW (A) 03/19/2017 1237   APPEARANCEUR CLEAR 03/19/2017 1237   LABSPEC 1.023 03/19/2017 1237   PHURINE 6.0 03/19/2017 1237   GLUCOSEU NEGATIVE 03/19/2017 1237   HGBUR SMALL (A) 03/19/2017 1237   HGBUR trace-intact 04/10/2010 1331   BILIRUBINUR NEGATIVE 03/19/2017 1237   BILIRUBINUR negative 12/30/2015 1554   KETONESUR NEGATIVE 03/19/2017 1237   PROTEINUR NEGATIVE 03/19/2017 1237   UROBILINOGEN 0.2 12/30/2015 1554   UROBILINOGEN 0.2 05/06/2011 1730   NITRITE NEGATIVE 03/19/2017 1237   LEUKOCYTESUR NEGATIVE 03/19/2017 1237   Sepsis Labs Invalid input(s): PROCALCITONIN,  WBC,  LACTICIDVEN Microbiology Recent Results (from the past 240 hour(s))  Culture, blood (Routine X 2) w Reflex to ID Panel     Status: None   Collection Time: 03/19/17  8:44 AM  Result Value Ref Range Status   Specimen Description BLOOD LEFT ARM  Final   Special Requests   Final    BOTTLES DRAWN AEROBIC AND ANAEROBIC Blood Culture adequate volume   Culture NO GROWTH 5 DAYS  Final   Report Status 03/24/2017 FINAL  Final  Culture, blood (Routine X 2) w Reflex to ID Panel     Status: None   Collection Time: 03/19/17  8:44 AM  Result Value Ref Range Status   Specimen Description BLOOD LEFT HAND  Final   Special Requests   Final    BOTTLES DRAWN AEROBIC AND ANAEROBIC Blood Culture adequate volume   Culture NO GROWTH 5 DAYS  Final   Report Status 03/24/2017 FINAL  Final  MRSA PCR Screening     Status: None   Collection Time: 03/19/17 12:45 PM  Result Value Ref Range Status   MRSA by PCR NEGATIVE NEGATIVE Final    Comment:        The GeneXpert MRSA Assay (FDA approved for NASAL specimens only), is one component of a comprehensive MRSA colonization surveillance program. It is not intended to diagnose MRSA infection nor to guide or monitor treatment for MRSA infections.      Time coordinating discharge: Over 30  minutes  SIGNED:   Louellen Molder, MD  [facility] 03/24/2017, 2:26 PM Pager   If 7PM-7AM, please contact night-coverage [email or web address] Password [email or web address]

## 2017-03-25 ENCOUNTER — Encounter: Payer: Self-pay | Admitting: Internal Medicine

## 2017-03-25 ENCOUNTER — Non-Acute Institutional Stay (SKILLED_NURSING_FACILITY): Payer: Medicare HMO | Admitting: Internal Medicine

## 2017-03-25 DIAGNOSIS — F418 Other specified anxiety disorders: Secondary | ICD-10-CM

## 2017-03-25 DIAGNOSIS — I2699 Other pulmonary embolism without acute cor pulmonale: Secondary | ICD-10-CM

## 2017-03-25 DIAGNOSIS — A6004 Herpesviral vulvovaginitis: Secondary | ICD-10-CM

## 2017-03-25 DIAGNOSIS — E7849 Other hyperlipidemia: Secondary | ICD-10-CM | POA: Diagnosis not present

## 2017-03-25 DIAGNOSIS — R69 Illness, unspecified: Secondary | ICD-10-CM | POA: Diagnosis not present

## 2017-03-25 DIAGNOSIS — I10 Essential (primary) hypertension: Secondary | ICD-10-CM | POA: Diagnosis not present

## 2017-03-25 DIAGNOSIS — Z853 Personal history of malignant neoplasm of breast: Secondary | ICD-10-CM

## 2017-03-25 DIAGNOSIS — R600 Localized edema: Secondary | ICD-10-CM

## 2017-03-25 NOTE — Progress Notes (Signed)
Provider: Veleta Miners  Location:   [facility] Room Number: 159/P Place of Service:  SNF (31)  PCP: Fayrene Helper, MD Patient Care Team: Fayrene Helper, MD as PCP - General  Extended Emergency Contact Information Primary Emergency Contact: [facility] Address: [place], [place] [address] Johnnette Litter of [place] Phone: [phone] Work Phone: [phone] Mobile Phone: [phone] Relation: Sister  Code Status: Full Code Goals of Care: Advanced Directive information Advanced Directives 03/25/2017  Does Patient Have a Medical Advance Directive? Yes  Type of Advance Directive (No Data)  Does patient want to make changes to medical advance directive? No - Patient declined  Would patient like information on creating a medical advance directive? No - Patient declined  Pre-existing out of facility DNR order (yellow form or pink MOST form) -      Chief Complaint  Patient presents with  . New Admit To SNF    New Admission Visit    HPI: Patient is a 77 y.o. female seen today for admission to SNF for therapy after staying in the hospital From 01/24- 01/30 for Acute PE  Patient has h/o Hypertension, Diastolic CHF, Hyperlipidemia who was admitted for SOB. CT Angiogram of chest showed Acute Bilateral PE. There was no Clear etiology for her PE. She was placed on IV heparin and then Transitioned to Eliquis. Her LE Doppler was Negative for DVT. Patient is doing well in facility. She is walking with the walker. Her only complain was SOB and she is worried she has to go home on Oxygen. Denies any Cough , chest pain, Or Fever She lives with her sister. Was very active and driving before this Hospitalization.   Past Medical History:  Diagnosis Date  . Bronchitis   . Cancer (Helper)   . Chronic back pain   . Depression   . GERD (gastroesophageal reflux disease)   . Hyperlipidemia   . Hypertension   . Insomnia   .  Kidney stone   . Pneumonia   . Vertigo    Past Surgical History:  Procedure Laterality Date  . ABDOMINAL HYSTERECTOMY    . BREAST SURGERY     left  . CYSTOSCOPY/RETROGRADE/URETEROSCOPY/STONE EXTRACTION WITH BASKET  01/07/2011   Procedure: CYSTOSCOPY/RETROGRADE/URETEROSCOPY/STONE EXTRACTION WITH BASKET;  Surgeon: Marissa Nestle;  Location: [facility];  Service: Urology;  Laterality: Left;  Balloon Dilatation; stone given to family per MD  . TUBAL LIGATION      reports that she quit smoking about 36 years ago. Her smoking use included cigarettes. She has a 5.00 pack-year smoking history. she has never used smokeless tobacco. She reports that she does not drink alcohol or use drugs. Social History   Socioeconomic History  . Marital status: Divorced    Spouse name: Not on file  . Number of children: Not on file  . Years of education: Not on file  . Highest education level: Not on file  Social Needs  . Financial resource strain: Not on file  . Food insecurity - worry: Not on file  . Food insecurity - inability: Not on file  . Transportation needs - medical: Not on file  . Transportation needs - non-medical: Not on file  Occupational History  . Not on file  Tobacco Use  . Smoking status: Former Smoker    Packs/day: 1.00    Years: 5.00    Pack years: 5.00    Types: Cigarettes  Last attempt to quit: 09/07/1980    Years since quitting: 36.5  . Smokeless tobacco: Never Used  . Tobacco comment: quit 25+ years ago  Substance and Sexual Activity  . Alcohol use: No    Alcohol/week: 0.0 oz  . Drug use: No  . Sexual activity: Not Currently  Other Topics Concern  . Not on file  Social History Narrative  . Not on file    Functional Status Survey:    Family History  Problem Relation Age of Onset  . COPD Father   . COPD Sister   . COPD Sister   . Diabetes Sister   . Diabetes Brother   . Seizures Mother   . Bone cancer Brother   . Heart disease Brother   . Aneurysm Brother         brain  . HIV/AIDS Brother     Health Maintenance  Topic Date Due  . COLONOSCOPY  04/24/2017 (Originally 02/21/1993)  . TETANUS/TDAP  01/06/2018 (Originally [birth year]/1962)  . MAMMOGRAM  06/16/2018  . INFLUENZA VACCINE  Completed  . DEXA SCAN  Completed  . PNA vac Low Risk Adult  Completed    Allergies  Allergen Reactions  . Meclizine Diarrhea  . Penicillins Rash and Other (See Comments)    Has patient had a PCN reaction causing immediate rash, facial/tongue/throat swelling, SOB or lightheadedness with hypotension: Yes Has patient had a PCN reaction causing severe rash involving mucus membranes or skin necrosis: No Has patient had a PCN reaction that required hospitalization No Has patient had a PCN reaction occurring within the last 10 years: No If all of the above answers are "NO", then may proceed with Cephalosporin use.     Outpatient Encounter Medications as of 03/25/2017  Medication Sig  . acetaminophen (TYLENOL) 500 MG tablet Take 1,000 mg by mouth every 6 (six) hours as needed for mild pain or moderate pain.   Marland Kitchen acyclovir (ZOVIRAX) 400 MG tablet TAKE 1 TABLET BY MOUTH TWICE DAILY  . albuterol (PROVENTIL) (2.5 MG/3ML) 0.083% nebulizer solution Take 3 mLs (2.5 mg total) by nebulization every 6 (six) hours as needed for wheezing or shortness of breath.  Marland Kitchen amLODipine-benazepril (LOTREL) 5-40 MG capsule TAKE 1 CAPSULE DAILY  . apixaban (ELIQUIS) 5 MG TABS tablet Take 10 mg by mouth 2 (two) times daily.  Marland Kitchen apixaban (ELIQUIS) 5 MG TABS tablet Take 5 mg by mouth 2 (two) times daily.  Marland Kitchen atorvastatin (LIPITOR) 20 MG tablet TAKE 1 TABLET EVERY EVENING  . dextromethorphan-guaiFENesin (MUCINEX DM) 30-600 MG 12hr tablet Take 1 tablet by mouth 2 (two) times daily.  . Fluoxetine HCl, PMDD, 10 MG TABS One tablet daily  . senna-docusate (SENOKOT-S) 8.6-50 MG tablet Take 2 tablets by mouth at bedtime as needed for mild constipation or moderate constipation.  . temazepam (RESTORIL) 30 MG  capsule Take 1 capsule (30 mg total) by mouth at bedtime as needed.  . vitamin C (ASCORBIC ACID) 500 MG tablet Take 500 mg by mouth daily.  . [DISCONTINUED] apixaban (ELIQUIS) 5 MG TABS tablet Take 2 tablets (10 mg total) by mouth 2 (two) times daily. Take 10 mg twice daily for 5 days , followed by 5 mg twice daily starting 03/29/2017) (Patient taking differently: Take 5 mg by mouth 2 (two) times daily. Take 10 mg twice daily for 5 days , followed by 5 mg twice daily starting 03/29/2017))  . [DISCONTINUED] furosemide (LASIX) 20 MG tablet One tablet twice weekly as needed for ankle swelling (Patient taking differently:  Take 20 mg by mouth 2 (two) times a week. One tablet twice weekly as needed for ankle swelling)  . [DISCONTINUED] potassium chloride (KLOR-CON 10) 10 MEQ tablet One tablet twice weekly onlyon the days you take the lasix ( fluid pill)   No facility-administered encounter medications on file as of 03/25/2017.      Review of Systems  Review of Systems  Constitutional: Negative for activity change, appetite change, chills, diaphoresis, fatigue and fever.  HENT: Negative for mouth sores, postnasal drip, rhinorrhea, sinus pain and sore throat.   Respiratory: Negative for apnea, cough, chest tightness,  and wheezing.   Cardiovascular: Negative for chest pain, palpitations and leg swelling.  Gastrointestinal: Negative for abdominal distention, abdominal pain, constipation, diarrhea, nausea and vomiting.  Genitourinary: Negative for dysuria and frequency.  Musculoskeletal: Negative for arthralgias, joint swelling and myalgias.  Skin: Negative for rash.  Neurological: Negative for dizziness, syncope, weakness, light-headedness and numbness.  Psychiatric/Behavioral: Negative for behavioral problems, confusion and sleep disturbance.     Vitals:   03/25/17 1227  BP: (!) 144/90  Pulse: 74  Resp: 18  Temp: 98.6 F (37 C)   There is no height or weight on file to calculate BMI. Physical  Exam  Constitutional: She is oriented to person, place, and time. She appears well-developed and well-nourished.  HENT:  Head: Normocephalic.  Mouth/Throat: Oropharynx is clear and moist.  Eyes: Pupils are equal, round, and reactive to light.  Neck: Neck supple.  Cardiovascular: Normal rate, regular rhythm and normal heart sounds.  No murmur heard. Pulmonary/Chest: Effort normal and breath sounds normal.  Few Rales in Bases Bilateral.  Abdominal: Soft. Bowel sounds are normal. She exhibits no distension. There is no tenderness. There is no rebound.  Musculoskeletal:  Patient has moderate edema Bilateral  Lymphadenopathy:    She has no cervical adenopathy.  Neurological: She is alert and oriented to person, place, and time.  No Focal deficits.  Skin: Skin is warm and dry.  Psychiatric: She has a normal mood and affect. Her behavior is normal. Thought content normal.    Labs reviewed: Basic Metabolic Panel: Recent Labs    03/22/17 0448 03/23/17 0501 03/24/17 0454  NA 141 139 138  K 4.0 3.8 4.3  CL 109 104 101  CO2 23 23 24   GLUCOSE 95 101* 102*  BUN 6 8 9   CREATININE 0.62 0.74 0.74  CALCIUM 9.0 9.5 9.6   Liver Function Tests: Recent Labs    09/03/16 1343 11/30/16 1230 03/18/17 2050  AST 22 22 24   ALT 14 39* 14  ALKPHOS 59  --  66  BILITOT 0.6 0.6 0.8  PROT 7.0 7.7 8.2*  ALBUMIN 4.3  --  3.9   Recent Labs    03/18/17 2050  LIPASE 28   No results for input(s): AMMONIA in the last 8760 hours. CBC: Recent Labs    03/18/17 2050 03/19/17 0506  03/22/17 0448 03/23/17 0501 03/24/17 0454  WBC 17.1* 19.3*   < > 10.4 9.9 10.4  NEUTROABS 11.9* 15.4*  --   --   --   --   HGB 14.9 12.9   < > 10.6* 11.9* 12.1  HCT 44.9 40.2   < > 33.7* 36.9 37.7  MCV 97.2 97.8   < > 99.1 97.4 97.4  PLT 287 298   < > 335 401* 451*   < > = values in this interval not displayed.   Cardiac Enzymes: Recent Labs    03/18/17 2346 03/19/17 0506  03/19/17 0857  TROPONINI 0.05*  0.03* 0.03*   BNP: Invalid input(s): POCBNP No results found for: HGBA1C Lab Results  Component Value Date   TSH 0.76 11/30/2016   No results found for: VITAMINB12 No results found for: FOLATE No results found for: IRON, TIBC, FERRITIN  Imaging and Procedures obtained prior to SNF admission: No results found.  Assessment/Plan   Acute pulmonary embolism  Patient on Eliquis. To continue for 6 months and then Revaluation by PCP about the continuation of anticoagulation. No etiology was found in the Hospital.  Essential hypertension Restarted on Lotrel. BP controlled. Will monitor.  Type 2 HSV infection  On Acyclovir  History of breast cancer in 2002 Patent says she received Chemo and radiation Therapy and has been in Remission.  Hyperlipidemia On Lipitor Follows with PCP  Depression with anxiety On Prozac and Restoril  Leg edema Patient is on Lasix PRN outpatient. Will watch her Weight while here. Dopplers were negative in Hospital. Disposition Discharge home with her sister.    Family/ staff Communication:   Labs/tests ordered: BMP And CBC  Total time spent in this patient care encounter was _45 minutes; greater than 50% of the visit spent counseling patient, reviewing records , Labs and coordinating care for problems addressed at this encounter.

## 2017-03-26 ENCOUNTER — Other Ambulatory Visit: Payer: Self-pay

## 2017-03-26 MED ORDER — TEMAZEPAM 30 MG PO CAPS: 30.0000 mg | ORAL_CAPSULE | Freq: Every evening | ORAL | 0 refills | Status: DC | PRN

## 2017-03-26 NOTE — Telephone Encounter (Signed)
RX Fax for [facility]@ [phone]  

## 2017-04-01 ENCOUNTER — Encounter (HOSPITAL_COMMUNITY)
Admission: RE | Admit: 2017-04-01 | Discharge: 2017-04-01 | Disposition: A | Discharge status: Home / Self Care | Payer: Medicare HMO | Source: Skilled Nursing Facility | Attending: Internal Medicine | Admitting: Internal Medicine

## 2017-04-01 DIAGNOSIS — I10 Essential (primary) hypertension: Secondary | ICD-10-CM | POA: Insufficient documentation

## 2017-04-01 DIAGNOSIS — E785 Hyperlipidemia, unspecified: Secondary | ICD-10-CM | POA: Insufficient documentation

## 2017-04-01 DIAGNOSIS — G8929 Other chronic pain: Secondary | ICD-10-CM | POA: Insufficient documentation

## 2017-04-01 DIAGNOSIS — F329 Major depressive disorder, single episode, unspecified: Secondary | ICD-10-CM | POA: Insufficient documentation

## 2017-04-01 DIAGNOSIS — I2699 Other pulmonary embolism without acute cor pulmonale: Secondary | ICD-10-CM | POA: Insufficient documentation

## 2017-04-01 DIAGNOSIS — G4700 Insomnia, unspecified: Secondary | ICD-10-CM | POA: Insufficient documentation

## 2017-04-01 LAB — COMPREHENSIVE METABOLIC PANEL
ALT: 16 U/L (ref 14–54)
AST: 20 U/L (ref 15–41)
Albumin: 3.2 g/dL — ABNORMAL LOW (ref 3.5–5.0)
Alkaline Phosphatase: 52 U/L (ref 38–126)
Anion gap: 10 (ref 5–15)
BUN: 15 mg/dL (ref 6–20)
CO2: 22 mmol/L (ref 22–32)
Calcium: 9.7 mg/dL (ref 8.9–10.3)
Chloride: 107 mmol/L (ref 101–111)
Creatinine, Ser: 0.7 mg/dL (ref 0.44–1.00)
GFR calc Af Amer: >60 mL/min (ref >60)
GFR calc non Af Amer: >60 mL/min (ref >60)
Glucose, Bld: 97 mg/dL (ref 65–99)
Potassium: 4.1 mmol/L (ref 3.5–5.1)
Sodium: 139 mmol/L (ref 135–145)
Total Bilirubin: 0.3 mg/dL (ref 0.3–1.2)
Total Protein: 7 g/dL (ref 6.5–8.1)

## 2017-04-01 LAB — CBC WITH DIFFERENTIAL/PLATELET
Basophils Absolute: 0 10*3/uL (ref 0.0–0.1)
Basophils Relative: 0 %
Eosinophils Absolute: 0.1 10*3/uL (ref 0.0–0.7)
Eosinophils Relative: 2 %
HCT: 39.2 % (ref 36.0–46.0)
Hemoglobin: 12.6 g/dL (ref 12.0–15.0)
Lymphocytes Relative: 35 %
Lymphs Abs: 2.5 10*3/uL (ref 0.7–4.0)
MCH: 31.3 pg (ref 26.0–34.0)
MCHC: 32.1 g/dL (ref 30.0–36.0)
MCV: 97.5 fL (ref 78.0–100.0)
Monocytes Absolute: 0.5 10*3/uL (ref 0.1–1.0)
Monocytes Relative: 7 %
Neutro Abs: 4 10*3/uL (ref 1.7–7.7)
Neutrophils Relative %: 56 %
Platelets: 505 10*3/uL — ABNORMAL HIGH (ref 150–400)
RBC: 4.02 MIL/uL (ref 3.87–5.11)
RDW: 13.1 % (ref 11.5–15.5)
WBC: 7.2 10*3/uL (ref 4.0–10.5)

## 2017-04-05 ENCOUNTER — Encounter: Payer: Self-pay | Admitting: Internal Medicine

## 2017-04-05 ENCOUNTER — Non-Acute Institutional Stay (SKILLED_NURSING_FACILITY): Payer: Medicare HMO | Admitting: Internal Medicine

## 2017-04-05 DIAGNOSIS — I10 Essential (primary) hypertension: Secondary | ICD-10-CM | POA: Diagnosis not present

## 2017-04-05 DIAGNOSIS — F418 Other specified anxiety disorders: Secondary | ICD-10-CM

## 2017-04-05 DIAGNOSIS — I2699 Other pulmonary embolism without acute cor pulmonale: Secondary | ICD-10-CM

## 2017-04-05 DIAGNOSIS — E7849 Other hyperlipidemia: Secondary | ICD-10-CM | POA: Diagnosis not present

## 2017-04-05 DIAGNOSIS — R69 Illness, unspecified: Secondary | ICD-10-CM | POA: Diagnosis not present

## 2017-04-05 NOTE — Progress Notes (Signed)
Location:   [facility] Room Number: 159/P Place of Service:  SNF [phone]) Provider:  Thurmond Butts, MD  Patient Care Team: Fayrene Helper, MD as PCP - General  Extended Emergency Contact Information Primary Emergency Contact: [facility] Address: [place], [place] [address] [place] of [place] Phone: [phone] Work Phone: [phone] Mobile Phone: [phone] Relation: Sister  Code Status:  Full Code Goals of care: Advanced Directive information Advanced Directives 04/05/2017  Does Patient Have a Medical Advance Directive? Yes  Type of Advance Directive (No Data)  Does patient want to make changes to medical advance directive? No - Patient declined  Would patient like information on creating a medical advance directive? No - Patient declined  Pre-existing out of facility DNR order (yellow form or pink MOST form) -     Chief Complaint  Patient presents with  . Discharge Note        HPI:  Pt is a 77 y.o. female seen today for discharge from Facility. Patient has a history of hypertension, diastolic CHF, hyperlipidemia.  She was admitted in the hospital from 01 /24-01/30  for acute PE. Patient initially presented to ED with shortness of breath and chest pain.  CT angiogram of chest showed acute bilateral PE.  There was no clear etiology found for her PE.  She was placed on IV heparin and transition to Eliquis.  Her lower extremity Dopplers were negative for DVT.  She was discharged to SNF for therapy. Patient did well in the facility.  She is walking without any assistance.  She was tapered off her oxygen.  She did have some cough with productive sputum that has resolved with Mucinex as needed.  She lives with her sister.  She was very active and driving before this hospitalization.  She is planning to go back and stay with her sister her support.   Past Medical History:  Diagnosis Date  .  Bronchitis   . Cancer ([place])   . Chronic back pain   . Depression   . GERD (gastroesophageal reflux disease)   . Hyperlipidemia   . Hypertension   . Insomnia   . Kidney stone   . Pneumonia   . Vertigo    Past Surgical History:  Procedure Laterality Date  . ABDOMINAL HYSTERECTOMY    . BREAST SURGERY     left  . CYSTOSCOPY/RETROGRADE/URETEROSCOPY/STONE EXTRACTION WITH BASKET  01/07/2011   Procedure: CYSTOSCOPY/RETROGRADE/URETEROSCOPY/STONE EXTRACTION WITH BASKET;  Surgeon: Marissa Nestle;  Location: [facility];  Service: Urology;  Laterality: Left;  Balloon Dilatation; stone given to family per MD  . TUBAL LIGATION      Allergies  Allergen Reactions  . Meclizine Diarrhea  . Penicillins Rash and Other (See Comments)    Has patient had a PCN reaction causing immediate rash, facial/tongue/throat swelling, SOB or lightheadedness with hypotension: Yes Has patient had a PCN reaction causing severe rash involving mucus membranes or skin necrosis: No Has patient had a PCN reaction that required hospitalization No Has patient had a PCN reaction occurring within the last 10 years: No If all of the above answers are "NO", then may proceed with Cephalosporin use.     Outpatient Encounter Medications as of 04/05/2017  Medication Sig  . acetaminophen (TYLENOL) 500 MG tablet Take 1,000 mg by mouth every 6 (six) hours as needed for mild pain or moderate pain.   Marland Kitchen acyclovir (ZOVIRAX)  400 MG tablet TAKE 1 TABLET BY MOUTH TWICE DAILY  . albuterol (PROVENTIL) (2.5 MG/3ML) 0.083% nebulizer solution Take 3 mLs (2.5 mg total) by nebulization every 6 (six) hours as needed for wheezing or shortness of breath.  Marland Kitchen amLODipine-benazepril (LOTREL) 5-40 MG capsule TAKE 1 CAPSULE DAILY  . apixaban (ELIQUIS) 5 MG TABS tablet Take 5 mg by mouth 2 (two) times daily.  Marland Kitchen atorvastatin (LIPITOR) 20 MG tablet TAKE 1 TABLET EVERY EVENING  . dextromethorphan-guaiFENesin (MUCINEX DM) 30-600 MG 12hr tablet Take 1 tablet by  mouth 2 (two) times daily.  . Fluoxetine HCl, PMDD, 10 MG TABS One tablet daily  . gabapentin (NEURONTIN) 100 MG capsule Take 100 mg by mouth 2 (two) times daily.  Marland Kitchen senna-docusate (SENOKOT-S) 8.6-50 MG tablet Take 2 tablets by mouth at bedtime as needed for mild constipation or moderate constipation.  . temazepam (RESTORIL) 30 MG capsule Take 1 capsule (30 mg total) by mouth at bedtime as needed.  . vitamin C (ASCORBIC ACID) 500 MG tablet Take 500 mg by mouth daily.  . [DISCONTINUED] apixaban (ELIQUIS) 5 MG TABS tablet Take 10 mg by mouth 2 (two) times daily.   No facility-administered encounter medications on file as of 04/05/2017.      Review of Systems  Immunization History  Administered Date(s) Administered  . Influenza Split 12/18/2010  . Influenza Whole 01/13/2010  . Influenza,inj,Quad PF,6+ Mos 11/28/2012, 11/27/2013, 12/17/2014, 12/30/2015, 11/30/2016  . Pneumococcal Conjugate-13 03/14/2014  . Pneumococcal Polysaccharide-23 10/10/2012   Pertinent  Health Maintenance Due  Topic Date Due  . COLONOSCOPY  04/24/2017 (Originally 02/21/1993)  . MAMMOGRAM  06/16/2018  . INFLUENZA VACCINE  Completed  . DEXA SCAN  Completed  . PNA vac Low Risk Adult  Completed   Fall Risk  09/07/2016 05/27/2016 07/24/2015 02/12/2015 04/02/2014  Falls in the past year? No No No No No   Functional Status Survey:    Vitals:   04/05/17 1154  BP: 119/70  Pulse: 68  Resp: 19  Temp: (!) 97.5 F (36.4 C)  TempSrc: Oral   There is no height or weight on file to calculate BMI. Physical Exam  Constitutional: She is oriented to person, place, and time. She appears well-developed and well-nourished.  HENT:  Head: Normocephalic.  Mouth/Throat: Oropharynx is clear and moist.  Eyes: Pupils are equal, round, and reactive to light.  Neck: Neck supple.  Cardiovascular: Normal rate and normal heart sounds.  No murmur heard. Pulmonary/Chest: Effort normal and breath sounds normal. No respiratory distress.  She has no wheezes. She has no rales.  Abdominal: Soft. Bowel sounds are normal. She exhibits no distension. There is no tenderness. There is no rebound.  Musculoskeletal:  Mild edema bilateral  Lymphadenopathy:    She has no cervical adenopathy.  Neurological: She is alert and oriented to person, place, and time.  Skin: Skin is warm and dry.  Psychiatric: She has a normal mood and affect. Her behavior is normal. Thought content normal.    Labs reviewed: Recent Labs    03/23/17 0501 03/24/17 0454 04/01/17 0733  NA 139 138 139  K 3.8 4.3 4.1  CL 104 101 107  CO2 23 24 22   GLUCOSE 101* 102* 97  BUN 8 9 15   CREATININE 0.74 0.74 0.70  CALCIUM 9.5 9.6 9.7   Recent Labs    09/03/16 1343 11/30/16 1230 03/18/17 2050 04/01/17 0733  AST 22 22 24 20   ALT 14 39* 14 16  ALKPHOS 59  --  66 52  BILITOT 0.6 0.6 0.8 0.3  PROT 7.0 7.7 8.2* 7.0  ALBUMIN 4.3  --  3.9 3.2*   Recent Labs    03/18/17 2050 03/19/17 0506  03/23/17 0501 03/24/17 0454 04/01/17 0733  WBC 17.1* 19.3*   < > 9.9 10.4 7.2  NEUTROABS 11.9* 15.4*  --   --   --  4.0  HGB 14.9 12.9   < > 11.9* 12.1 12.6  HCT 44.9 40.2   < > 36.9 37.7 39.2  MCV 97.2 97.8   < > 97.4 97.4 97.5  PLT 287 298   < > 401* 451* 505*   < > = values in this interval not displayed.   Lab Results  Component Value Date   TSH 0.76 11/30/2016   No results found for: HGBA1C Lab Results  Component Value Date   CHOL 179 11/30/2016   HDL 65 11/30/2016   LDLCALC 72 09/03/2016   TRIG 142 11/30/2016   CHOLHDL 2.8 11/30/2016    Significant Diagnostic Results in last 30 days:  Ct Angio Chest Pe W Or Wo Contrast  Result Date: 03/19/2017 CLINICAL DATA:  Shortness of breath. EXAM: CT ANGIOGRAPHY CHEST WITH CONTRAST TECHNIQUE: Multidetector CT imaging of the chest was performed using the standard protocol during bolus administration of intravenous contrast. Multiplanar CT image reconstructions and MIPs were obtained to evaluate the vascular  anatomy. CONTRAST:  67mL ISOVUE-370 IOPAMIDOL (ISOVUE-370) INJECTION 76% COMPARISON:  Chest radiograph 03/18/2017 FINDINGS: Cardiovascular: Bilateral pulmonary emboli. The most central pulmonary embolus is within the distal main right pulmonary artery, and extends to the bifurcation of the right lower lobar and right middle lobar pulmonary arteries. Small portion of the embolus is seen at the takeoff of the right upper lobar pulmonary artery is well. The left-sided pulmonary emboli are within the left upper lobar pulmonary artery and segmental branches of the left lower lobar pulmonary artery. There is evidence of right heart strain with RV/LV ratio of 1.3. Mediastinum/Nodes: No enlarged mediastinal, hilar, or axillary lymph nodes. Thyroid gland, trachea, and esophagus demonstrate no significant findings. Lungs/Pleura: Right lower lobe atelectasis versus peribronchial airspace consolidation. Mild right middle lobe atelectasis. Small right pleural effusion. Minimal left lower lobe atelectasis. Upper Abdomen: No acute abnormality. Musculoskeletal: Postsurgical changes in the left chest wall/axilla. No evidence of fracture or significant osseous abnormality. Exaggeration of thoracic kyphosis with mild osteoarthritic changes. Review of the MIP images confirms the above findings. IMPRESSION: Bilateral pulmonary emboli. The most central pulmonary embolus is within the distal right main pulmonary artery and extends to the bifurcation of the right lower and right middle lobar pulmonary arteries, where it becomes nearly occlusive. The left-sided pulmonary emboli are within the left upper lobar pulmonary artery and segmental branches of the left lower lobar pulmonary artery. Evidence of right heart strain with RV/LV ratio of 1.3 Right lower lobar atelectasis versus peribronchial airspace consolidation. Small right pleural effusion. Minimal left lower lobar atelectasis. These results were called by telephone at the time of  interpretation on 03/19/2017 at 11:40 am to Dr. Roxan Hockey , who verbally acknowledged these results. Electronically Signed   By: Fidela Salisbury M.D.   On: 03/19/2017 11:43   Ct Abdomen Pelvis W Contrast  Result Date: 03/18/2017 CLINICAL DATA:  Abdominal discomfort, lower chest pain, watery diarrhea EXAM: CT ABDOMEN AND PELVIS WITH CONTRAST TECHNIQUE: Multidetector CT imaging of the abdomen and pelvis was performed using the standard protocol following bolus administration of intravenous contrast. CONTRAST:  113mL ISOVUE-300 IOPAMIDOL (ISOVUE-300) INJECTION 61% COMPARISON:  04/26/2015 FINDINGS: Lower chest: Lung bases demonstrate small right-sided pleural effusion with streaky atelectasis or infiltrate at the right base. Cardiomegaly. Hepatobiliary: Mild steatosis. No calcified gallstones or biliary dilatation. Gallbladder is contracted Pancreas: Unremarkable. No pancreatic ductal dilatation or surrounding inflammatory changes. Spleen: Normal in size without focal abnormality. Adrenals/Urinary Tract: Adrenal glands are within normal limits. No hydronephrosis. Cyst in the mid right kidney. Additional subcentimeter hypodensities too small to further characterize. The bladder is normal Stomach/Bowel: Stomach is within normal limits. Appendix appears normal. No evidence of bowel wall thickening, distention, or inflammatory changes. Sigmoid colon scattered diverticula without acute inflammation Vascular/Lymphatic: Mild aortic atherosclerosis. No aneurysmal dilatation. No significantly enlarged lymph nodes Reproductive: Status post hysterectomy. No adnexal masses. Other: Negative for free air or free fluid. Musculoskeletal: Degenerative changes. No acute or suspicious lesion. IMPRESSION: 1. Negative for bowel obstruction or bowel wall thickening 2. Mild hepatic steatosis 3. Small right pleural effusion with streaky atelectasis or infiltrate at the right base. Electronically Signed   By: Donavan Foil M.D.    On: 03/18/2017 23:14   US Venous Img Lower Bilateral  Result Date: 03/20/2017 CLINICAL DATA:  77 year old female with a history bilateral pulmonary embolism EXAM: BILATERAL LOWER EXTREMITY VENOUS DOPPLER ULTRASOUND TECHNIQUE: Gray-scale sonography with graded compression, as well as color Doppler and duplex ultrasound were performed to evaluate the lower extremity deep venous systems from the level of the common femoral vein and including the common femoral, femoral, profunda femoral, popliteal and calf veins including the posterior tibial, peroneal and gastrocnemius veins when visible. The superficial great saphenous vein was also interrogated. Spectral Doppler was utilized to evaluate flow at rest and with distal augmentation maneuvers in the common femoral, femoral and popliteal veins. COMPARISON:  None. FINDINGS: RIGHT LOWER EXTREMITY Common Femoral Vein: No evidence of thrombus. Normal compressibility, respiratory phasicity and response to augmentation. Saphenofemoral Junction: No evidence of thrombus. Normal compressibility and flow on color Doppler imaging. Profunda Femoral Vein: No evidence of thrombus. Normal compressibility and flow on color Doppler imaging. Femoral Vein: No evidence of thrombus. Normal compressibility, respiratory phasicity and response to augmentation. Popliteal Vein: No evidence of thrombus. Normal compressibility, respiratory phasicity and response to augmentation. Calf Veins: No evidence of thrombus. Normal compressibility and flow on color Doppler imaging. Superficial Great Saphenous Vein: No evidence of thrombus. Normal compressibility and flow on color Doppler imaging. Other Findings:  None. LEFT LOWER EXTREMITY Common Femoral Vein: No evidence of thrombus. Normal compressibility, respiratory phasicity and response to augmentation. Saphenofemoral Junction: No evidence of thrombus. Normal compressibility and flow on color Doppler imaging. Profunda Femoral Vein: No evidence of  thrombus. Normal compressibility and flow on color Doppler imaging. Femoral Vein: No evidence of thrombus. Normal compressibility, respiratory phasicity and response to augmentation. Popliteal Vein: No evidence of thrombus. Normal compressibility, respiratory phasicity and response to augmentation. Calf Veins: No evidence of thrombus. Normal compressibility and flow on color Doppler imaging. Superficial Great Saphenous Vein: No evidence of thrombus. Normal compressibility and flow on color Doppler imaging. Other Findings:  None. IMPRESSION: Sonographic survey of the bilateral lower extremities negative for DVT Electronically Signed   By: Corrie Mckusick D.O.   On: 03/20/2017 14:37   Dg Chest Port 1 View  Result Date: 03/22/2017 CLINICAL DATA:  Shortness of breath. History of hypertension. Ex-smoker. EXAM: PORTABLE CHEST 1 VIEW COMPARISON:  CT 03/19/2017.  Radiographs 03/18/2017. FINDINGS: The heart size and mediastinal contours stable. No significant change in bibasilar pulmonary opacities and bilateral pleural effusions. There is no edema or  pneumothorax. Surgical clips are present in the left axilla. No acute osseous findings. IMPRESSION: No significant change in bibasilar atelectasis and small bilateral pleural effusions. Known pulmonary embolism on recent CT. Electronically Signed   By: Richardean Sale M.D.   On: 03/22/2017 10:24   Dg Chest Port 1 View  Result Date: 03/18/2017 CLINICAL DATA:  Upper abdominal pain onset tonight with diarrhea. EXAM: PORTABLE CHEST 1 VIEW COMPARISON:  None. FINDINGS: Heart is enlarged. Aorta is nonaneurysmal. Atherosclerotic calcifications are seen at the aortic arch. Lung volumes are low. There is pulmonary vascular congestion with probable small left and tiny right effusions. Surgical clips project over the left axilla with small focus of air within the soft tissues of the left axilla possibly related to intervention or air trapped within skin fold. Small focus of soft  tissue infection is not entirely excluded. No acute osseous abnormality. IMPRESSION: 1. Low lung volumes with cardiomegaly and CHF. Small left and tiny right effusions. 2. Small rounded focus of air in the soft tissues of the left axilla, nonspecific. Differential possibilities given above. Electronically Signed   By: Ashley Royalty M.D.   On: 03/18/2017 21:17    Assessment/Plan  Acute PE Patient will be continued on Eliquis for 6 months.  She would be evaluated by her PCP about the continuation.  No etiology was found in the hospital for her acute PE.   Essential hypertension Patient's blood pressure has been controlled on Lotrel  type II HSV infection On acyclovir History of breast cancer in 2002 Patient since she received chemo and radiation therapy and has been in remission Hyperlipidemia On Lipitor Follows with PCP Depression with anxiety On Prozac and Restoril per PCP No Patient on Lasix as needed as outpatient Her weight was labile in the  facility Dopplers were negative in the hospital Disposition Patient will be discharged in few days with her sister She will follow-up with her PCP in a week She would have follow-up with home health nurse   discussed with Patient and  her sister  also discussed with the therapist     Family/ staff Communication:   Labs/tests ordered:  CBC as Outpatient. Total time in Discharge planning More then 30 min. Marland Kitchen

## 2017-04-08 DIAGNOSIS — I110 Hypertensive heart disease with heart failure: Secondary | ICD-10-CM | POA: Diagnosis not present

## 2017-04-08 DIAGNOSIS — Z79891 Long term (current) use of opiate analgesic: Secondary | ICD-10-CM | POA: Diagnosis not present

## 2017-04-08 DIAGNOSIS — Z7901 Long term (current) use of anticoagulants: Secondary | ICD-10-CM | POA: Diagnosis not present

## 2017-04-08 DIAGNOSIS — R69 Illness, unspecified: Secondary | ICD-10-CM | POA: Diagnosis not present

## 2017-04-08 DIAGNOSIS — K219 Gastro-esophageal reflux disease without esophagitis: Secondary | ICD-10-CM | POA: Diagnosis not present

## 2017-04-08 DIAGNOSIS — E785 Hyperlipidemia, unspecified: Secondary | ICD-10-CM | POA: Diagnosis not present

## 2017-04-08 DIAGNOSIS — I2699 Other pulmonary embolism without acute cor pulmonale: Secondary | ICD-10-CM | POA: Diagnosis not present

## 2017-04-08 DIAGNOSIS — G8929 Other chronic pain: Secondary | ICD-10-CM | POA: Diagnosis not present

## 2017-04-08 DIAGNOSIS — I5042 Chronic combined systolic (congestive) and diastolic (congestive) heart failure: Secondary | ICD-10-CM | POA: Diagnosis not present

## 2017-04-09 DIAGNOSIS — I2699 Other pulmonary embolism without acute cor pulmonale: Secondary | ICD-10-CM | POA: Diagnosis not present

## 2017-04-09 DIAGNOSIS — R69 Illness, unspecified: Secondary | ICD-10-CM | POA: Diagnosis not present

## 2017-04-09 DIAGNOSIS — I5042 Chronic combined systolic (congestive) and diastolic (congestive) heart failure: Secondary | ICD-10-CM | POA: Diagnosis not present

## 2017-04-09 DIAGNOSIS — Z79891 Long term (current) use of opiate analgesic: Secondary | ICD-10-CM | POA: Diagnosis not present

## 2017-04-09 DIAGNOSIS — K219 Gastro-esophageal reflux disease without esophagitis: Secondary | ICD-10-CM | POA: Diagnosis not present

## 2017-04-09 DIAGNOSIS — Z7901 Long term (current) use of anticoagulants: Secondary | ICD-10-CM | POA: Diagnosis not present

## 2017-04-09 DIAGNOSIS — I110 Hypertensive heart disease with heart failure: Secondary | ICD-10-CM | POA: Diagnosis not present

## 2017-04-09 DIAGNOSIS — G8929 Other chronic pain: Secondary | ICD-10-CM | POA: Diagnosis not present

## 2017-04-09 DIAGNOSIS — E785 Hyperlipidemia, unspecified: Secondary | ICD-10-CM | POA: Diagnosis not present

## 2017-04-12 DIAGNOSIS — R69 Illness, unspecified: Secondary | ICD-10-CM | POA: Diagnosis not present

## 2017-04-12 DIAGNOSIS — Z79891 Long term (current) use of opiate analgesic: Secondary | ICD-10-CM | POA: Diagnosis not present

## 2017-04-12 DIAGNOSIS — I5042 Chronic combined systolic (congestive) and diastolic (congestive) heart failure: Secondary | ICD-10-CM | POA: Diagnosis not present

## 2017-04-12 DIAGNOSIS — Z7901 Long term (current) use of anticoagulants: Secondary | ICD-10-CM | POA: Diagnosis not present

## 2017-04-12 DIAGNOSIS — I110 Hypertensive heart disease with heart failure: Secondary | ICD-10-CM | POA: Diagnosis not present

## 2017-04-12 DIAGNOSIS — G8929 Other chronic pain: Secondary | ICD-10-CM | POA: Diagnosis not present

## 2017-04-12 DIAGNOSIS — I2699 Other pulmonary embolism without acute cor pulmonale: Secondary | ICD-10-CM | POA: Diagnosis not present

## 2017-04-12 DIAGNOSIS — E785 Hyperlipidemia, unspecified: Secondary | ICD-10-CM | POA: Diagnosis not present

## 2017-04-12 DIAGNOSIS — K219 Gastro-esophageal reflux disease without esophagitis: Secondary | ICD-10-CM | POA: Diagnosis not present

## 2017-04-13 ENCOUNTER — Encounter: Payer: Self-pay | Admitting: Family Medicine

## 2017-04-13 ENCOUNTER — Other Ambulatory Visit: Payer: Self-pay

## 2017-04-13 ENCOUNTER — Ambulatory Visit (INDEPENDENT_AMBULATORY_CARE_PROVIDER_SITE_OTHER): Payer: Medicare HMO | Admitting: Family Medicine

## 2017-04-13 VITALS — BP 120/80 | HR 80 | Temp 96.8°F | Resp 18 | Ht 67.0 in | Wt 190.1 lb

## 2017-04-13 DIAGNOSIS — R2681 Unsteadiness on feet: Secondary | ICD-10-CM | POA: Diagnosis not present

## 2017-04-13 DIAGNOSIS — R202 Paresthesia of skin: Secondary | ICD-10-CM | POA: Diagnosis not present

## 2017-04-13 DIAGNOSIS — Z09 Encounter for follow-up examination after completed treatment for conditions other than malignant neoplasm: Secondary | ICD-10-CM | POA: Diagnosis not present

## 2017-04-13 DIAGNOSIS — R200 Anesthesia of skin: Secondary | ICD-10-CM | POA: Diagnosis not present

## 2017-04-13 DIAGNOSIS — F99 Mental disorder, not otherwise specified: Secondary | ICD-10-CM

## 2017-04-13 DIAGNOSIS — F5105 Insomnia due to other mental disorder: Secondary | ICD-10-CM

## 2017-04-13 DIAGNOSIS — I2699 Other pulmonary embolism without acute cor pulmonale: Secondary | ICD-10-CM | POA: Diagnosis not present

## 2017-04-13 DIAGNOSIS — R69 Illness, unspecified: Secondary | ICD-10-CM | POA: Diagnosis not present

## 2017-04-13 MED ORDER — AIRS DISPOSABLE NEBULIZER KIT: 1.0000 [IU] | PACK | Freq: Every day | 0 refills | Status: DC | PRN

## 2017-04-13 MED ORDER — TEMAZEPAM 30 MG PO CAPS: 30.0000 mg | ORAL_CAPSULE | Freq: Every evening | ORAL | 0 refills | Status: DC | PRN

## 2017-04-13 MED ORDER — VITAMIN D (ERGOCALCIFEROL) 1.25 MG (50000 UNIT) PO CAPS: ORAL_CAPSULE | ORAL | 0 refills | Status: DC

## 2017-04-13 MED ORDER — TOILET SAFETY FRAME MISC: 1.0000 | Freq: Every day | 0 refills | Status: DC | PRN

## 2017-04-13 NOTE — Progress Notes (Signed)
Chief Complaint  Patient presents with  . Transitions Of Care   PCP is Tula Nakayama She is here for transition of care after having a hospitalization and rehabilitation stay.   Patient was in her usual health until she developed sharp chest pain, shortness of breath, and abdominal pain on the day of 03/18/17.  She was seen in the emergency room.  Initially it was felt she might be in congestive heart failure.  Because of persistent chest pain a chest CT was done.  She had multiple bilateral pulmonary emboli.  She was transferred to and treated in the intensive care unit.  A CT of her abdomen and pelvis, and bilateral leg Dopplers failed to show any etiology for the blood clots.  There is a vague history of blood clots in the family.  She is never been tested for hypercoagulability.  She is never had a blood clot before.  She has a remote history of breast cancer. After her hospitalization she was quite hypoxic and deconditioned.  She was sent to the [facility] for rehabilitation.  She reports performed well in the [facility] with no complications and steady improvement.  She was discharged home on 04/05/17 to the care of her sister She is here today with her sister Arnettie and her nephew.  They have many  questions about her ongoing treatment and care. I reviewed the hospitalization results with her.  We talked about her echocardiogram, her CAT scans, her imaging, laboratory findings.  I reviewed the [facility] treatment, exercises, and rehabilitation.  We discussed her medication list in detail.  She needs to stay on Eliquis for at least 6 months.  That would be until 09/16/17.  The decision to take her off of Eliquis will be up to Dr. Moshe Cipro.  Family expresses that because she has a family history of hypercoagulability, and the serious bilateral pulmonary emboli, they might choose to leave her on anticoagulation. We discussed her breathing.  Her breathing is better.  She no longer needs  oxygen.  She is mildly short of breath.  She is using a spirometer to "exercise her lungs".  There was sent home with nebulizer solution, but no nebulizer.  If she gets short of breath she has been using sisters albuterol inhaler.  I did order a nebulizer today and sent a prescription to [place]. Home health nurse has been to the home.  They recommend a safety rail near the commode.  I have ordered this as well. She complains of prickling and tingling in both feet.  She states is been going on for about a year, getting worse.  She states that she does not know if she is mentioned it to Dr. Moshe Cipro.  She does not have diabetes or any known reason to have neuropathy.  It is possibly from her old chemotherapy, unlikely so many years later.  She has not had a vitamin B12 level checked and I recommend this.  She does not want blood work today.  She does have a history of vitamin D deficiency.  She is not taking her vitamin D because of GI distress.  She states that she does tolerate the vitamin D 50,000 unit pills.  I have given her prescription for this as well to take on a regular basis.  The importance of vitamin D for healthy bones is reinforced to patient and family.  Patient Active Problem List   Diagnosis Date Noted  . Diarrhea 03/19/2017  . Acute Bil  pulmonary embolism ([place]) 03/19/2017  . Fatigue 05/31/2016  . Unsteady gait 05/31/2016  . Annual physical exam 07/24/2015  . GERD (gastroesophageal reflux disease) 04/27/2015  . Depression with anxiety 11/27/2013  . Type 2 HSV infection of vulvovaginal region 03/12/2012  . History of breast cancer in female 01/13/2010  . Hyperlipidemia 01/13/2010  . Essential hypertension 01/13/2010  . Insomnia 01/13/2010    Outpatient Encounter Medications as of 04/13/2017  Medication Sig  . acetaminophen (TYLENOL) 500 MG tablet Take 1,000 mg by mouth every 6 (six) hours as needed for mild pain or moderate pain.   Marland Kitchen acyclovir (ZOVIRAX) 400 MG tablet TAKE  1 TABLET BY MOUTH TWICE DAILY  . albuterol (PROVENTIL) (2.5 MG/3ML) 0.083% nebulizer solution Take 3 mLs (2.5 mg total) by nebulization every 6 (six) hours as needed for wheezing or shortness of breath.  Marland Kitchen amLODipine-benazepril (LOTREL) 5-40 MG capsule TAKE 1 CAPSULE DAILY  . apixaban (ELIQUIS) 5 MG TABS tablet Take 5 mg by mouth 2 (two) times daily.  Marland Kitchen atorvastatin (LIPITOR) 20 MG tablet TAKE 1 TABLET EVERY EVENING  . Fluoxetine HCl, PMDD, 10 MG TABS One tablet daily  . gabapentin (NEURONTIN) 100 MG capsule Take 100 mg by mouth 2 (two) times daily.  Marland Kitchen senna-docusate (SENOKOT-S) 8.6-50 MG tablet Take 2 tablets by mouth at bedtime as needed for mild constipation or moderate constipation.  . temazepam (RESTORIL) 30 MG capsule Take 1 capsule (30 mg total) by mouth at bedtime as needed.  . vitamin C (ASCORBIC ACID) 500 MG tablet Take 500 mg by mouth daily.  . Misc. Devices ([place]) MISC 1 each by Does not apply route daily as needed.  Marland Kitchen Respiratory Therapy Supplies (AIRS DISPOSABLE NEBULIZER) KIT 1 Units by Does not apply route daily as needed.  . Vitamin D, Ergocalciferol, (DRISDOL) 50000 units CAPS capsule Take one capsule once a week for 4 weeks, then one a month   No facility-administered encounter medications on file as of 04/13/2017.     Allergies  Allergen Reactions  . Meclizine Diarrhea  . Penicillins Rash and Other (See Comments)    Has patient had a PCN reaction causing immediate rash, facial/tongue/throat swelling, SOB or lightheadedness with hypotension: no Has patient had a PCN reaction causing severe rash involving mucus membranes or skin necrosis: No Has patient had a PCN reaction that required hospitalization No Has patient had a PCN reaction occurring within the last 10 years: No If all of the above answers are "NO", then may proceed with Cephalosporin use.     Review of Systems  Constitutional: Positive for fatigue. Negative for activity change, appetite change  and unexpected weight change.       Good appetite, stable weight  HENT: Negative for congestion and dental problem.   Eyes: Negative for photophobia and visual disturbance.  Respiratory: Positive for shortness of breath. Negative for cough and wheezing.        Short of breath with exertion, improving  Cardiovascular: Positive for chest pain and leg swelling.       Chest pain infrequently, resolving  Gastrointestinal: Negative for constipation, diarrhea and vomiting.       Had one episode of constipation from pain medicine, resolved  Genitourinary: Negative for difficulty urinating and frequency.  Musculoskeletal: Positive for gait problem. Negative for arthralgias and back pain.       Mild unsteady gait, improving  Neurological: Negative for dizziness, numbness and headaches.  Hematological: Negative for adenopathy. Does not bruise/bleed easily.  No bruising or bleeding on Eliquis  Psychiatric/Behavioral: Negative for decreased concentration. The patient is not nervous/anxious.     BP 120/80 (BP Location: Right Arm, Patient Position: Sitting, Cuff Size: Normal)   Pulse 80   Temp (!) 96.8 F (36 C) (Temporal)   Resp 18   Ht _0  (1.702 m)   Wt 190 lb 1.3 oz (86.2 kg)   SpO2 98%   BMI 29.77 kg/m   Physical Exam  Constitutional: She is oriented to person, place, and time. She appears well-developed and well-nourished. No distress.  Quiet by nature.  Very gracious  HENT:  Head: Normocephalic and atraumatic.  Right Ear: External ear normal.  Left Ear: External ear normal.  Mouth/Throat: Oropharynx is clear and moist.  Eyes: Conjunctivae are normal. Pupils are equal, round, and reactive to light.  Neck: Normal range of motion. Neck supple. No thyromegaly present.  Cardiovascular: Normal rate and regular rhythm.  Murmur heard. Pulses:      Dorsalis pedis pulses are 2+ on the right side, and 1+ on the left side.       Posterior tibial pulses are 2+ on the right side, and 1+  on the left side.  Soft systolic murmur right upper sternal border  Pulmonary/Chest: Effort normal and breath sounds normal. No respiratory distress.  No wheeze.  No chest wall tenderness  Abdominal: Soft. Bowel sounds are normal.  Musculoskeletal: Normal range of motion. She exhibits edema.       Right foot: There is normal range of motion and no deformity.       Left foot: There is normal range of motion and no deformity.  Trace edema.  Feet:  Right Foot:  Protective Sensation: 6 sites tested. 6 sites sensed.  Left Foot:  Protective Sensation: 6 sites tested. 6 sites sensed.  Lymphadenopathy:    She has no cervical adenopathy.  Neurological: She is alert and oriented to person, place, and time. She displays normal reflexes. No sensory deficit. Coordination normal.  Gait normal  Skin: Skin is warm and dry.  Multiple varicose veins bilaterally, superficial  Psychiatric: She has a normal mood and affect. Her behavior is normal. Thought content normal.  Nursing note and vitals reviewed.   ASSESSMENT/PLAN:  1. Hospital discharge follow-up Hospital records testing and recommendations are reviewed and discussed with patient  2. Other acute pulmonary embolism without acute cor pulmonale ([place]) Stay on Eliquis for 6 months  3. Insomnia due to other mental disorder Temazepam refilled.  Cautioned about taking benzodiazepines and elderly, falls, confusion. 4.  Numbness and tingling of feet By report, patient has normal sensory examination.  She can increase her gabapentin  #5 unsteady gait Getting home physical therapy  Patient Instructions  You are doing well. You are going to be on the blood thinner for at least 6 months (Eliquis) This will be monitored by Dr. Moshe Cipro.  You can discuss with her staying on it longer. Continue your breathing exercises. I have ordered a nebulizer machine for the albuterol liquid capsules. I have ordered modification for your home bathroom. These   were ordered at [facility] and exercise every day that you are able, to your tolerance. Continue low-salt diet Try to drink more water You should supplement vitamin B12 I have prescribed vitamin D to your [facility] increase the gabapentin as needed for the foot tingling  Dr. Moshe Cipro in 1 month   Raylene Everts, MD

## 2017-04-13 NOTE — Patient Instructions (Addendum)
You are doing well. You are going to be on the blood thinner for at least 6 months (Eliquis) This will be monitored by Dr. Moshe Cipro.  You can discuss with her staying on it longer. Continue your breathing exercises. I have ordered a nebulizer machine for the albuterol liquid capsules. I have ordered modification for your home bathroom. These  were ordered at [facility] and exercise every day that you are able, to your tolerance. Continue low-salt diet Try to drink more water You should supplement vitamin B12 I have prescribed vitamin D to your [facility] increase the gabapentin as needed for the foot tingling  Dr. Moshe Cipro in 1 month

## 2017-04-14 DIAGNOSIS — Z7901 Long term (current) use of anticoagulants: Secondary | ICD-10-CM | POA: Diagnosis not present

## 2017-04-14 DIAGNOSIS — K219 Gastro-esophageal reflux disease without esophagitis: Secondary | ICD-10-CM | POA: Diagnosis not present

## 2017-04-14 DIAGNOSIS — R69 Illness, unspecified: Secondary | ICD-10-CM | POA: Diagnosis not present

## 2017-04-14 DIAGNOSIS — I110 Hypertensive heart disease with heart failure: Secondary | ICD-10-CM | POA: Diagnosis not present

## 2017-04-14 DIAGNOSIS — I2699 Other pulmonary embolism without acute cor pulmonale: Secondary | ICD-10-CM | POA: Diagnosis not present

## 2017-04-14 DIAGNOSIS — E785 Hyperlipidemia, unspecified: Secondary | ICD-10-CM | POA: Diagnosis not present

## 2017-04-14 DIAGNOSIS — Z79891 Long term (current) use of opiate analgesic: Secondary | ICD-10-CM | POA: Diagnosis not present

## 2017-04-14 DIAGNOSIS — I5042 Chronic combined systolic (congestive) and diastolic (congestive) heart failure: Secondary | ICD-10-CM | POA: Diagnosis not present

## 2017-04-14 DIAGNOSIS — G8929 Other chronic pain: Secondary | ICD-10-CM | POA: Diagnosis not present

## 2017-04-15 DIAGNOSIS — G8929 Other chronic pain: Secondary | ICD-10-CM | POA: Diagnosis not present

## 2017-04-15 DIAGNOSIS — R69 Illness, unspecified: Secondary | ICD-10-CM | POA: Diagnosis not present

## 2017-04-15 DIAGNOSIS — I5042 Chronic combined systolic (congestive) and diastolic (congestive) heart failure: Secondary | ICD-10-CM | POA: Diagnosis not present

## 2017-04-15 DIAGNOSIS — E785 Hyperlipidemia, unspecified: Secondary | ICD-10-CM | POA: Diagnosis not present

## 2017-04-15 DIAGNOSIS — I110 Hypertensive heart disease with heart failure: Secondary | ICD-10-CM | POA: Diagnosis not present

## 2017-04-15 DIAGNOSIS — Z79891 Long term (current) use of opiate analgesic: Secondary | ICD-10-CM | POA: Diagnosis not present

## 2017-04-15 DIAGNOSIS — Z7901 Long term (current) use of anticoagulants: Secondary | ICD-10-CM | POA: Diagnosis not present

## 2017-04-15 DIAGNOSIS — I2699 Other pulmonary embolism without acute cor pulmonale: Secondary | ICD-10-CM | POA: Diagnosis not present

## 2017-04-15 DIAGNOSIS — K219 Gastro-esophageal reflux disease without esophagitis: Secondary | ICD-10-CM | POA: Diagnosis not present

## 2017-04-16 DIAGNOSIS — Z7901 Long term (current) use of anticoagulants: Secondary | ICD-10-CM | POA: Diagnosis not present

## 2017-04-16 DIAGNOSIS — R69 Illness, unspecified: Secondary | ICD-10-CM | POA: Diagnosis not present

## 2017-04-16 DIAGNOSIS — I2699 Other pulmonary embolism without acute cor pulmonale: Secondary | ICD-10-CM | POA: Diagnosis not present

## 2017-04-16 DIAGNOSIS — K219 Gastro-esophageal reflux disease without esophagitis: Secondary | ICD-10-CM | POA: Diagnosis not present

## 2017-04-16 DIAGNOSIS — Z79891 Long term (current) use of opiate analgesic: Secondary | ICD-10-CM | POA: Diagnosis not present

## 2017-04-16 DIAGNOSIS — I5042 Chronic combined systolic (congestive) and diastolic (congestive) heart failure: Secondary | ICD-10-CM | POA: Diagnosis not present

## 2017-04-16 DIAGNOSIS — E785 Hyperlipidemia, unspecified: Secondary | ICD-10-CM | POA: Diagnosis not present

## 2017-04-16 DIAGNOSIS — I110 Hypertensive heart disease with heart failure: Secondary | ICD-10-CM | POA: Diagnosis not present

## 2017-04-16 DIAGNOSIS — G8929 Other chronic pain: Secondary | ICD-10-CM | POA: Diagnosis not present

## 2017-04-19 DIAGNOSIS — E785 Hyperlipidemia, unspecified: Secondary | ICD-10-CM | POA: Diagnosis not present

## 2017-04-19 DIAGNOSIS — I110 Hypertensive heart disease with heart failure: Secondary | ICD-10-CM | POA: Diagnosis not present

## 2017-04-19 DIAGNOSIS — G8929 Other chronic pain: Secondary | ICD-10-CM | POA: Diagnosis not present

## 2017-04-19 DIAGNOSIS — Z79891 Long term (current) use of opiate analgesic: Secondary | ICD-10-CM | POA: Diagnosis not present

## 2017-04-19 DIAGNOSIS — I2699 Other pulmonary embolism without acute cor pulmonale: Secondary | ICD-10-CM | POA: Diagnosis not present

## 2017-04-19 DIAGNOSIS — Z7901 Long term (current) use of anticoagulants: Secondary | ICD-10-CM | POA: Diagnosis not present

## 2017-04-19 DIAGNOSIS — I5042 Chronic combined systolic (congestive) and diastolic (congestive) heart failure: Secondary | ICD-10-CM | POA: Diagnosis not present

## 2017-04-19 DIAGNOSIS — K219 Gastro-esophageal reflux disease without esophagitis: Secondary | ICD-10-CM | POA: Diagnosis not present

## 2017-04-19 DIAGNOSIS — R69 Illness, unspecified: Secondary | ICD-10-CM | POA: Diagnosis not present

## 2017-04-20 DIAGNOSIS — Z7901 Long term (current) use of anticoagulants: Secondary | ICD-10-CM | POA: Diagnosis not present

## 2017-04-20 DIAGNOSIS — Z79891 Long term (current) use of opiate analgesic: Secondary | ICD-10-CM | POA: Diagnosis not present

## 2017-04-20 DIAGNOSIS — I5042 Chronic combined systolic (congestive) and diastolic (congestive) heart failure: Secondary | ICD-10-CM | POA: Diagnosis not present

## 2017-04-20 DIAGNOSIS — R69 Illness, unspecified: Secondary | ICD-10-CM | POA: Diagnosis not present

## 2017-04-20 DIAGNOSIS — E785 Hyperlipidemia, unspecified: Secondary | ICD-10-CM | POA: Diagnosis not present

## 2017-04-20 DIAGNOSIS — G8929 Other chronic pain: Secondary | ICD-10-CM | POA: Diagnosis not present

## 2017-04-20 DIAGNOSIS — I2699 Other pulmonary embolism without acute cor pulmonale: Secondary | ICD-10-CM | POA: Diagnosis not present

## 2017-04-20 DIAGNOSIS — I110 Hypertensive heart disease with heart failure: Secondary | ICD-10-CM | POA: Diagnosis not present

## 2017-04-20 DIAGNOSIS — K219 Gastro-esophageal reflux disease without esophagitis: Secondary | ICD-10-CM | POA: Diagnosis not present

## 2017-04-21 DIAGNOSIS — G8929 Other chronic pain: Secondary | ICD-10-CM | POA: Diagnosis not present

## 2017-04-21 DIAGNOSIS — K219 Gastro-esophageal reflux disease without esophagitis: Secondary | ICD-10-CM | POA: Diagnosis not present

## 2017-04-21 DIAGNOSIS — E785 Hyperlipidemia, unspecified: Secondary | ICD-10-CM | POA: Diagnosis not present

## 2017-04-21 DIAGNOSIS — I2699 Other pulmonary embolism without acute cor pulmonale: Secondary | ICD-10-CM | POA: Diagnosis not present

## 2017-04-21 DIAGNOSIS — Z7901 Long term (current) use of anticoagulants: Secondary | ICD-10-CM | POA: Diagnosis not present

## 2017-04-21 DIAGNOSIS — I5042 Chronic combined systolic (congestive) and diastolic (congestive) heart failure: Secondary | ICD-10-CM | POA: Diagnosis not present

## 2017-04-21 DIAGNOSIS — R69 Illness, unspecified: Secondary | ICD-10-CM | POA: Diagnosis not present

## 2017-04-21 DIAGNOSIS — I110 Hypertensive heart disease with heart failure: Secondary | ICD-10-CM | POA: Diagnosis not present

## 2017-04-21 DIAGNOSIS — Z79891 Long term (current) use of opiate analgesic: Secondary | ICD-10-CM | POA: Diagnosis not present

## 2017-04-22 DIAGNOSIS — I2699 Other pulmonary embolism without acute cor pulmonale: Secondary | ICD-10-CM | POA: Diagnosis not present

## 2017-04-22 DIAGNOSIS — G8929 Other chronic pain: Secondary | ICD-10-CM | POA: Diagnosis not present

## 2017-04-22 DIAGNOSIS — I5042 Chronic combined systolic (congestive) and diastolic (congestive) heart failure: Secondary | ICD-10-CM | POA: Diagnosis not present

## 2017-04-22 DIAGNOSIS — Z79891 Long term (current) use of opiate analgesic: Secondary | ICD-10-CM | POA: Diagnosis not present

## 2017-04-22 DIAGNOSIS — R69 Illness, unspecified: Secondary | ICD-10-CM | POA: Diagnosis not present

## 2017-04-22 DIAGNOSIS — E785 Hyperlipidemia, unspecified: Secondary | ICD-10-CM | POA: Diagnosis not present

## 2017-04-22 DIAGNOSIS — K219 Gastro-esophageal reflux disease without esophagitis: Secondary | ICD-10-CM | POA: Diagnosis not present

## 2017-04-22 DIAGNOSIS — I110 Hypertensive heart disease with heart failure: Secondary | ICD-10-CM | POA: Diagnosis not present

## 2017-04-22 DIAGNOSIS — Z7901 Long term (current) use of anticoagulants: Secondary | ICD-10-CM | POA: Diagnosis not present

## 2017-04-23 DIAGNOSIS — I110 Hypertensive heart disease with heart failure: Secondary | ICD-10-CM | POA: Diagnosis not present

## 2017-04-23 DIAGNOSIS — E785 Hyperlipidemia, unspecified: Secondary | ICD-10-CM | POA: Diagnosis not present

## 2017-04-23 DIAGNOSIS — K219 Gastro-esophageal reflux disease without esophagitis: Secondary | ICD-10-CM | POA: Diagnosis not present

## 2017-04-23 DIAGNOSIS — R69 Illness, unspecified: Secondary | ICD-10-CM | POA: Diagnosis not present

## 2017-04-23 DIAGNOSIS — I2699 Other pulmonary embolism without acute cor pulmonale: Secondary | ICD-10-CM | POA: Diagnosis not present

## 2017-04-23 DIAGNOSIS — I5042 Chronic combined systolic (congestive) and diastolic (congestive) heart failure: Secondary | ICD-10-CM | POA: Diagnosis not present

## 2017-04-23 DIAGNOSIS — G8929 Other chronic pain: Secondary | ICD-10-CM | POA: Diagnosis not present

## 2017-04-23 DIAGNOSIS — Z79891 Long term (current) use of opiate analgesic: Secondary | ICD-10-CM | POA: Diagnosis not present

## 2017-04-23 DIAGNOSIS — Z7901 Long term (current) use of anticoagulants: Secondary | ICD-10-CM | POA: Diagnosis not present

## 2017-04-26 DIAGNOSIS — Z79891 Long term (current) use of opiate analgesic: Secondary | ICD-10-CM | POA: Diagnosis not present

## 2017-04-26 DIAGNOSIS — G8929 Other chronic pain: Secondary | ICD-10-CM | POA: Diagnosis not present

## 2017-04-26 DIAGNOSIS — R69 Illness, unspecified: Secondary | ICD-10-CM | POA: Diagnosis not present

## 2017-04-26 DIAGNOSIS — I5042 Chronic combined systolic (congestive) and diastolic (congestive) heart failure: Secondary | ICD-10-CM | POA: Diagnosis not present

## 2017-04-26 DIAGNOSIS — I110 Hypertensive heart disease with heart failure: Secondary | ICD-10-CM | POA: Diagnosis not present

## 2017-04-26 DIAGNOSIS — Z7901 Long term (current) use of anticoagulants: Secondary | ICD-10-CM | POA: Diagnosis not present

## 2017-04-26 DIAGNOSIS — E785 Hyperlipidemia, unspecified: Secondary | ICD-10-CM | POA: Diagnosis not present

## 2017-04-26 DIAGNOSIS — K219 Gastro-esophageal reflux disease without esophagitis: Secondary | ICD-10-CM | POA: Diagnosis not present

## 2017-04-26 DIAGNOSIS — I2699 Other pulmonary embolism without acute cor pulmonale: Secondary | ICD-10-CM | POA: Diagnosis not present

## 2017-04-27 DIAGNOSIS — R69 Illness, unspecified: Secondary | ICD-10-CM | POA: Diagnosis not present

## 2017-04-27 DIAGNOSIS — E785 Hyperlipidemia, unspecified: Secondary | ICD-10-CM | POA: Diagnosis not present

## 2017-04-27 DIAGNOSIS — I2699 Other pulmonary embolism without acute cor pulmonale: Secondary | ICD-10-CM | POA: Diagnosis not present

## 2017-04-27 DIAGNOSIS — I5042 Chronic combined systolic (congestive) and diastolic (congestive) heart failure: Secondary | ICD-10-CM | POA: Diagnosis not present

## 2017-04-27 DIAGNOSIS — I110 Hypertensive heart disease with heart failure: Secondary | ICD-10-CM | POA: Diagnosis not present

## 2017-04-27 DIAGNOSIS — Z79891 Long term (current) use of opiate analgesic: Secondary | ICD-10-CM | POA: Diagnosis not present

## 2017-04-27 DIAGNOSIS — Z7901 Long term (current) use of anticoagulants: Secondary | ICD-10-CM | POA: Diagnosis not present

## 2017-04-27 DIAGNOSIS — K219 Gastro-esophageal reflux disease without esophagitis: Secondary | ICD-10-CM | POA: Diagnosis not present

## 2017-04-27 DIAGNOSIS — G8929 Other chronic pain: Secondary | ICD-10-CM | POA: Diagnosis not present

## 2017-04-28 DIAGNOSIS — I2699 Other pulmonary embolism without acute cor pulmonale: Secondary | ICD-10-CM | POA: Diagnosis not present

## 2017-04-28 DIAGNOSIS — I5042 Chronic combined systolic (congestive) and diastolic (congestive) heart failure: Secondary | ICD-10-CM | POA: Diagnosis not present

## 2017-04-28 DIAGNOSIS — I110 Hypertensive heart disease with heart failure: Secondary | ICD-10-CM | POA: Diagnosis not present

## 2017-04-28 DIAGNOSIS — E785 Hyperlipidemia, unspecified: Secondary | ICD-10-CM | POA: Diagnosis not present

## 2017-04-28 DIAGNOSIS — Z79891 Long term (current) use of opiate analgesic: Secondary | ICD-10-CM | POA: Diagnosis not present

## 2017-04-28 DIAGNOSIS — K219 Gastro-esophageal reflux disease without esophagitis: Secondary | ICD-10-CM | POA: Diagnosis not present

## 2017-04-28 DIAGNOSIS — R69 Illness, unspecified: Secondary | ICD-10-CM | POA: Diagnosis not present

## 2017-04-28 DIAGNOSIS — Z7901 Long term (current) use of anticoagulants: Secondary | ICD-10-CM | POA: Diagnosis not present

## 2017-04-28 DIAGNOSIS — G8929 Other chronic pain: Secondary | ICD-10-CM | POA: Diagnosis not present

## 2017-04-29 DIAGNOSIS — I5042 Chronic combined systolic (congestive) and diastolic (congestive) heart failure: Secondary | ICD-10-CM | POA: Diagnosis not present

## 2017-04-29 DIAGNOSIS — Z7901 Long term (current) use of anticoagulants: Secondary | ICD-10-CM | POA: Diagnosis not present

## 2017-04-29 DIAGNOSIS — E785 Hyperlipidemia, unspecified: Secondary | ICD-10-CM | POA: Diagnosis not present

## 2017-04-29 DIAGNOSIS — Z79891 Long term (current) use of opiate analgesic: Secondary | ICD-10-CM | POA: Diagnosis not present

## 2017-04-29 DIAGNOSIS — K219 Gastro-esophageal reflux disease without esophagitis: Secondary | ICD-10-CM | POA: Diagnosis not present

## 2017-04-29 DIAGNOSIS — G8929 Other chronic pain: Secondary | ICD-10-CM | POA: Diagnosis not present

## 2017-04-29 DIAGNOSIS — F418 Other specified anxiety disorders: Secondary | ICD-10-CM | POA: Diagnosis not present

## 2017-04-29 DIAGNOSIS — I2699 Other pulmonary embolism without acute cor pulmonale: Secondary | ICD-10-CM | POA: Diagnosis not present

## 2017-04-29 DIAGNOSIS — R69 Illness, unspecified: Secondary | ICD-10-CM | POA: Diagnosis not present

## 2017-04-29 DIAGNOSIS — I110 Hypertensive heart disease with heart failure: Secondary | ICD-10-CM | POA: Diagnosis not present

## 2017-04-30 DIAGNOSIS — K219 Gastro-esophageal reflux disease without esophagitis: Secondary | ICD-10-CM | POA: Diagnosis not present

## 2017-04-30 DIAGNOSIS — I5042 Chronic combined systolic (congestive) and diastolic (congestive) heart failure: Secondary | ICD-10-CM | POA: Diagnosis not present

## 2017-04-30 DIAGNOSIS — Z7901 Long term (current) use of anticoagulants: Secondary | ICD-10-CM | POA: Diagnosis not present

## 2017-04-30 DIAGNOSIS — Z79891 Long term (current) use of opiate analgesic: Secondary | ICD-10-CM | POA: Diagnosis not present

## 2017-04-30 DIAGNOSIS — I2699 Other pulmonary embolism without acute cor pulmonale: Secondary | ICD-10-CM | POA: Diagnosis not present

## 2017-04-30 DIAGNOSIS — R69 Illness, unspecified: Secondary | ICD-10-CM | POA: Diagnosis not present

## 2017-04-30 DIAGNOSIS — E785 Hyperlipidemia, unspecified: Secondary | ICD-10-CM | POA: Diagnosis not present

## 2017-04-30 DIAGNOSIS — G8929 Other chronic pain: Secondary | ICD-10-CM | POA: Diagnosis not present

## 2017-04-30 DIAGNOSIS — I110 Hypertensive heart disease with heart failure: Secondary | ICD-10-CM | POA: Diagnosis not present

## 2017-05-03 ENCOUNTER — Other Ambulatory Visit: Payer: Self-pay

## 2017-05-03 ENCOUNTER — Other Ambulatory Visit: Payer: Self-pay | Admitting: Family Medicine

## 2017-05-03 ENCOUNTER — Telehealth: Payer: Self-pay | Admitting: Family Medicine

## 2017-05-03 DIAGNOSIS — Z7901 Long term (current) use of anticoagulants: Secondary | ICD-10-CM | POA: Diagnosis not present

## 2017-05-03 DIAGNOSIS — E785 Hyperlipidemia, unspecified: Secondary | ICD-10-CM | POA: Diagnosis not present

## 2017-05-03 DIAGNOSIS — I2699 Other pulmonary embolism without acute cor pulmonale: Secondary | ICD-10-CM | POA: Diagnosis not present

## 2017-05-03 DIAGNOSIS — I5042 Chronic combined systolic (congestive) and diastolic (congestive) heart failure: Secondary | ICD-10-CM | POA: Diagnosis not present

## 2017-05-03 DIAGNOSIS — G8929 Other chronic pain: Secondary | ICD-10-CM | POA: Diagnosis not present

## 2017-05-03 DIAGNOSIS — I110 Hypertensive heart disease with heart failure: Secondary | ICD-10-CM | POA: Diagnosis not present

## 2017-05-03 DIAGNOSIS — R69 Illness, unspecified: Secondary | ICD-10-CM | POA: Diagnosis not present

## 2017-05-03 DIAGNOSIS — Z79891 Long term (current) use of opiate analgesic: Secondary | ICD-10-CM | POA: Diagnosis not present

## 2017-05-03 DIAGNOSIS — K219 Gastro-esophageal reflux disease without esophagitis: Secondary | ICD-10-CM | POA: Diagnosis not present

## 2017-05-03 MED ORDER — ACYCLOVIR 400 MG PO TABS: 400.0000 mg | ORAL_TABLET | Freq: Two times a day (BID) | ORAL | 5 refills | Status: DC

## 2017-05-03 MED ORDER — TEMAZEPAM 30 MG PO CAPS: 30.0000 mg | ORAL_CAPSULE | Freq: Every evening | ORAL | 4 refills | Status: DC | PRN

## 2017-05-03 MED ORDER — APIXABAN 5 MG PO TABS: 5.0000 mg | ORAL_TABLET | Freq: Two times a day (BID) | ORAL | 0 refills | Status: DC

## 2017-05-03 MED ORDER — GABAPENTIN 100 MG PO CAPS: 100.0000 mg | ORAL_CAPSULE | Freq: Two times a day (BID) | ORAL | 5 refills | Status: DC

## 2017-05-03 MED ORDER — VITAMIN D (ERGOCALCIFEROL) 1.25 MG (50000 UNIT) PO CAPS: ORAL_CAPSULE | ORAL | 5 refills | Status: DC

## 2017-05-03 MED ORDER — TEMAZEPAM 15 MG PO CAPS: ORAL_CAPSULE | ORAL | 3 refills | Status: DC

## 2017-05-03 NOTE — Progress Notes (Signed)
Theresa Garcia aware

## 2017-05-03 NOTE — Telephone Encounter (Signed)
Called patient to see how she is doing post discharge reports good

## 2017-05-03 NOTE — Progress Notes (Unsigned)
Patient's sister came in for AMV and requested medication refills. The home health nurse wants to know if adding amlodipine- to widen blood vessels- would be possible

## 2017-05-03 NOTE — Progress Notes (Signed)
restoril 

## 2017-05-03 NOTE — Progress Notes (Signed)
She has a f/u appt to be seen with me in a few weeks , I will assess the BP then and know if safe to do at that time

## 2017-05-05 DIAGNOSIS — E785 Hyperlipidemia, unspecified: Secondary | ICD-10-CM | POA: Diagnosis not present

## 2017-05-05 DIAGNOSIS — R69 Illness, unspecified: Secondary | ICD-10-CM | POA: Diagnosis not present

## 2017-05-05 DIAGNOSIS — K219 Gastro-esophageal reflux disease without esophagitis: Secondary | ICD-10-CM | POA: Diagnosis not present

## 2017-05-05 DIAGNOSIS — Z7901 Long term (current) use of anticoagulants: Secondary | ICD-10-CM | POA: Diagnosis not present

## 2017-05-05 DIAGNOSIS — G8929 Other chronic pain: Secondary | ICD-10-CM | POA: Diagnosis not present

## 2017-05-05 DIAGNOSIS — I2699 Other pulmonary embolism without acute cor pulmonale: Secondary | ICD-10-CM | POA: Diagnosis not present

## 2017-05-05 DIAGNOSIS — Z79891 Long term (current) use of opiate analgesic: Secondary | ICD-10-CM | POA: Diagnosis not present

## 2017-05-05 DIAGNOSIS — I110 Hypertensive heart disease with heart failure: Secondary | ICD-10-CM | POA: Diagnosis not present

## 2017-05-05 DIAGNOSIS — I5042 Chronic combined systolic (congestive) and diastolic (congestive) heart failure: Secondary | ICD-10-CM | POA: Diagnosis not present

## 2017-05-06 DIAGNOSIS — R69 Illness, unspecified: Secondary | ICD-10-CM | POA: Diagnosis not present

## 2017-05-06 DIAGNOSIS — G8929 Other chronic pain: Secondary | ICD-10-CM | POA: Diagnosis not present

## 2017-05-06 DIAGNOSIS — I110 Hypertensive heart disease with heart failure: Secondary | ICD-10-CM | POA: Diagnosis not present

## 2017-05-06 DIAGNOSIS — E785 Hyperlipidemia, unspecified: Secondary | ICD-10-CM | POA: Diagnosis not present

## 2017-05-06 DIAGNOSIS — I5042 Chronic combined systolic (congestive) and diastolic (congestive) heart failure: Secondary | ICD-10-CM | POA: Diagnosis not present

## 2017-05-06 DIAGNOSIS — Z7901 Long term (current) use of anticoagulants: Secondary | ICD-10-CM | POA: Diagnosis not present

## 2017-05-06 DIAGNOSIS — K219 Gastro-esophageal reflux disease without esophagitis: Secondary | ICD-10-CM | POA: Diagnosis not present

## 2017-05-06 DIAGNOSIS — I2699 Other pulmonary embolism without acute cor pulmonale: Secondary | ICD-10-CM | POA: Diagnosis not present

## 2017-05-06 DIAGNOSIS — Z79891 Long term (current) use of opiate analgesic: Secondary | ICD-10-CM | POA: Diagnosis not present

## 2017-05-07 DIAGNOSIS — Z79891 Long term (current) use of opiate analgesic: Secondary | ICD-10-CM | POA: Diagnosis not present

## 2017-05-07 DIAGNOSIS — K219 Gastro-esophageal reflux disease without esophagitis: Secondary | ICD-10-CM | POA: Diagnosis not present

## 2017-05-07 DIAGNOSIS — E785 Hyperlipidemia, unspecified: Secondary | ICD-10-CM | POA: Diagnosis not present

## 2017-05-07 DIAGNOSIS — Z7901 Long term (current) use of anticoagulants: Secondary | ICD-10-CM | POA: Diagnosis not present

## 2017-05-07 DIAGNOSIS — I110 Hypertensive heart disease with heart failure: Secondary | ICD-10-CM | POA: Diagnosis not present

## 2017-05-07 DIAGNOSIS — I2699 Other pulmonary embolism without acute cor pulmonale: Secondary | ICD-10-CM | POA: Diagnosis not present

## 2017-05-07 DIAGNOSIS — I5042 Chronic combined systolic (congestive) and diastolic (congestive) heart failure: Secondary | ICD-10-CM | POA: Diagnosis not present

## 2017-05-07 DIAGNOSIS — R69 Illness, unspecified: Secondary | ICD-10-CM | POA: Diagnosis not present

## 2017-05-07 DIAGNOSIS — G8929 Other chronic pain: Secondary | ICD-10-CM | POA: Diagnosis not present

## 2017-05-17 ENCOUNTER — Encounter: Payer: Self-pay | Admitting: Family Medicine

## 2017-05-17 ENCOUNTER — Ambulatory Visit (INDEPENDENT_AMBULATORY_CARE_PROVIDER_SITE_OTHER): Payer: Medicare HMO | Admitting: Family Medicine

## 2017-05-17 VITALS — BP 118/74 | HR 91 | Resp 14 | Ht 66.0 in | Wt 191.0 lb

## 2017-05-17 DIAGNOSIS — I2699 Other pulmonary embolism without acute cor pulmonale: Secondary | ICD-10-CM

## 2017-05-17 DIAGNOSIS — R69 Illness, unspecified: Secondary | ICD-10-CM | POA: Diagnosis not present

## 2017-05-17 DIAGNOSIS — F418 Other specified anxiety disorders: Secondary | ICD-10-CM

## 2017-05-17 DIAGNOSIS — A6004 Herpesviral vulvovaginitis: Secondary | ICD-10-CM

## 2017-05-17 DIAGNOSIS — E7849 Other hyperlipidemia: Secondary | ICD-10-CM

## 2017-05-17 DIAGNOSIS — I10 Essential (primary) hypertension: Secondary | ICD-10-CM

## 2017-05-17 MED ORDER — FLUOXETINE HCL 10 MG PO CAPS: 10.0000 mg | ORAL_CAPSULE | Freq: Every day | ORAL | 5 refills | Status: DC

## 2017-05-17 NOTE — Patient Instructions (Signed)
F/u in 4 month, call if you need me sooner  You will need to stay on a blood thinner for at leat 6 months, probably lifetime , I will research and decide on that  Gabapentin discontinued  Resume fluoxetine  We will mail any labs you may need  We wiill schedule your April mammogram on a Monday and call with appt info  Please keep active  Furosemide and potassium potassium are also discontinud and albuterol  Thankful youi are happier and feel better  Thank you  for choosing [facility]. We consider it a privelige to serve you.  Delivering excellent health care in a caring and  compassionate way is our goal.  Partnering with you,  so that together we can achieve this goal is our strategy.

## 2017-05-20 ENCOUNTER — Other Ambulatory Visit: Payer: Self-pay

## 2017-05-20 DIAGNOSIS — Z1231 Encounter for screening mammogram for malignant neoplasm of breast: Secondary | ICD-10-CM

## 2017-05-24 ENCOUNTER — Encounter: Payer: Self-pay | Admitting: Family Medicine

## 2017-05-24 NOTE — Assessment & Plan Note (Signed)
Controlled, no change in medication  

## 2017-05-24 NOTE — Assessment & Plan Note (Signed)
Hyperlipidemia:Low fat diet discussed and encouraged.   Lipid Panel  Lab Results  Component Value Date   CHOL 179 11/30/2016   HDL 65 11/30/2016   LDLCALC 90 11/30/2016   TRIG 142 11/30/2016   CHOLHDL 2.8 11/30/2016  Controlled, no change in medication Updated lab needed at/ before next visit.

## 2017-05-24 NOTE — Progress Notes (Signed)
   Theresa Garcia     MRN: [id number]      DOB: 10-Sep-1940   HPI [name] is here for follow up and re-evaluation of chronic medical conditions, medication management Preventive health is updated, specifically   The PT denies any adverse reactions to current medications since the last visit. Since I saw her last she required hospitalization for acute bilateral PE and subsequent rehab, she is now doing well. Denies any SOB, hematuria or rectal bleeding Becoming increasingly stronger Sister has specific questions re acyclovir and why she needs this and how she got hSV2 infection  There are no new concerns.  ROS Denies recent fever or chills. Denies sinus pressure, nasal congestion, ear pain or sore throat. Denies chest congestion, productive cough or wheezing. Denies chest pains, palpitations and leg swelling Denies abdominal pain, nausea, vomiting,diarrhea or constipation.   Denies dysuria, frequency, hesitancy or incontinence. Denies uncontrolled  joint pain, swelling and limitation in mobility. Denies headaches, seizures, numbness, or tingling. Denies depression, anxiety or insomnia. Denies skin break down or rash.   PE  BP 118/74   Pulse 91   Resp 14   Ht 5\' 6"  (1.676 m)   Wt 191 lb (86.6 kg)   SpO2 97%   BMI 30.83 kg/m   Patient alert and oriented and in no cardiopulmonary distress.  HEENT: No facial asymmetry, EOMI,   oropharynx pink and moist.  Neck supple no JVD, no mass.  Chest: Clear to auscultation bilaterally.  CVS: S1, S2 no murmurs, no S3.Regular rate.  ABD: Soft non tender.   Ext: No edema  MS: Adequate though reduced  ROM spine, shoulders, hips and knees.  Skin: Intact, no ulcerations or rash noted.  Psych: Good eye contact, blunted  affect. Memory impaired  not anxious or depressed appearing.  CNS: CN 2-12 intact, power,  normal throughout.no focal deficits noted.   Assessment & Plan  Acute Bil pulmonary embolism ([place]) Eliquis until  09/20/2017, may consider lifetime treatment, pt remains in sedentary state more often than not and her Mother also had PE, also she has a personal h/o breast cancer  Essential hypertension Controlled, no change in medication   Depression with anxiety Controlled, no change in medication   Hyperlipidemia Hyperlipidemia:Low fat diet discussed and encouraged.   Lipid Panel  Lab Results  Component Value Date   CHOL 179 11/30/2016   HDL 65 11/30/2016   LDLCALC 90 11/30/2016   TRIG 142 11/30/2016   CHOLHDL 2.8 11/30/2016  Controlled, no change in medication Updated lab needed at/ before next visit.      Type 2 HSV infection of vulvovaginal region Maintained symtom free on antiviral therapy, continue same

## 2017-05-24 NOTE — Assessment & Plan Note (Addendum)
Eliquis until 09/20/2017, may consider lifetime treatment, pt remains in sedentary state more often than not and her Mother also had PE, also she has a personal h/o breast cancer

## 2017-05-24 NOTE — Assessment & Plan Note (Signed)
Maintained symtom free on antiviral therapy, continue same

## 2017-05-28 ENCOUNTER — Telehealth: Payer: Self-pay

## 2017-05-28 ENCOUNTER — Other Ambulatory Visit: Payer: Self-pay | Admitting: Family Medicine

## 2017-05-28 MED ORDER — PROMETHAZINE-DM 6.25-15 MG/5ML PO SYRP: ORAL_SOLUTION | ORAL | 0 refills | Status: DC

## 2017-05-28 NOTE — Telephone Encounter (Signed)
She is on no allergy meds and this is the season! Starting allergy tab every day will like help, I recommend  Once daily OTC claritin ,( loratidine ) Or zyrtec(, certrizine ) I will refill th cough syrup one time only and I havesenti t in let her know please

## 2017-05-28 NOTE — Telephone Encounter (Signed)
Pt complaining of still having a cough at night and wants refill of promethazine DM syrup. Please advise

## 2017-05-28 NOTE — Telephone Encounter (Signed)
Patient aware.

## 2017-05-31 ENCOUNTER — Other Ambulatory Visit: Payer: Self-pay | Admitting: Family Medicine

## 2017-06-03 ENCOUNTER — Telehealth: Payer: Self-pay | Admitting: Family Medicine

## 2017-06-03 NOTE — Telephone Encounter (Signed)
Pt called in LVM with complaint of burning with urination. I called the pt back to advise that Dr Moshe Cipro was Unavailable and that Dr Meda Coffee could see her offered time for Thursday and Friday and she advised that was not good.

## 2017-06-04 ENCOUNTER — Telehealth: Payer: Self-pay | Admitting: Family Medicine

## 2017-06-04 NOTE — Telephone Encounter (Signed)
The PT called in LVM regarding burning with urination, and I called her back and advised that she will need to come in for a Nurse visit.

## 2017-06-15 ENCOUNTER — Other Ambulatory Visit: Payer: Self-pay

## 2017-06-15 MED ORDER — TEMAZEPAM 30 MG PO CAPS: 30.0000 mg | ORAL_CAPSULE | Freq: Every evening | ORAL | 4 refills | Status: DC | PRN

## 2017-06-28 ENCOUNTER — Ambulatory Visit (HOSPITAL_COMMUNITY)
Admission: RE | Admit: 2017-06-28 | Discharge: 2017-06-28 | Disposition: A | Discharge status: Home / Self Care | Payer: Medicare HMO | Source: Ambulatory Visit | Attending: Family Medicine | Admitting: Family Medicine

## 2017-06-28 ENCOUNTER — Encounter (HOSPITAL_COMMUNITY): Payer: Self-pay

## 2017-06-28 DIAGNOSIS — Z1231 Encounter for screening mammogram for malignant neoplasm of breast: Secondary | ICD-10-CM

## 2017-06-28 HISTORY — DX: Personal history of irradiation: Z92.3

## 2017-06-28 HISTORY — DX: Personal history of antineoplastic chemotherapy: Z92.21

## 2017-07-12 DIAGNOSIS — R69 Illness, unspecified: Secondary | ICD-10-CM | POA: Diagnosis not present

## 2017-07-12 DIAGNOSIS — G4700 Insomnia, unspecified: Secondary | ICD-10-CM | POA: Diagnosis not present

## 2017-07-12 DIAGNOSIS — I10 Essential (primary) hypertension: Secondary | ICD-10-CM | POA: Diagnosis not present

## 2017-07-12 DIAGNOSIS — Z7901 Long term (current) use of anticoagulants: Secondary | ICD-10-CM | POA: Diagnosis not present

## 2017-07-12 DIAGNOSIS — G8929 Other chronic pain: Secondary | ICD-10-CM | POA: Diagnosis not present

## 2017-07-12 DIAGNOSIS — E669 Obesity, unspecified: Secondary | ICD-10-CM | POA: Diagnosis not present

## 2017-07-12 DIAGNOSIS — G629 Polyneuropathy, unspecified: Secondary | ICD-10-CM | POA: Diagnosis not present

## 2017-07-12 DIAGNOSIS — E785 Hyperlipidemia, unspecified: Secondary | ICD-10-CM | POA: Diagnosis not present

## 2017-07-12 DIAGNOSIS — Z6830 Body mass index (BMI) 30.0-30.9, adult: Secondary | ICD-10-CM | POA: Diagnosis not present

## 2017-07-12 DIAGNOSIS — B009 Herpesviral infection, unspecified: Secondary | ICD-10-CM | POA: Diagnosis not present

## 2017-08-03 ENCOUNTER — Encounter: Payer: Self-pay | Admitting: Family Medicine

## 2017-08-03 ENCOUNTER — Ambulatory Visit (INDEPENDENT_AMBULATORY_CARE_PROVIDER_SITE_OTHER): Payer: Medicare HMO | Admitting: Family Medicine

## 2017-08-03 VITALS — BP 120/82 | HR 81 | Resp 16 | Ht 66.0 in | Wt 189.1 lb

## 2017-08-03 DIAGNOSIS — I10 Essential (primary) hypertension: Secondary | ICD-10-CM

## 2017-08-03 DIAGNOSIS — R2232 Localized swelling, mass and lump, left upper limb: Secondary | ICD-10-CM | POA: Diagnosis not present

## 2017-08-03 DIAGNOSIS — R69 Illness, unspecified: Secondary | ICD-10-CM | POA: Diagnosis not present

## 2017-08-03 DIAGNOSIS — E7849 Other hyperlipidemia: Secondary | ICD-10-CM

## 2017-08-03 DIAGNOSIS — E669 Obesity, unspecified: Secondary | ICD-10-CM | POA: Diagnosis not present

## 2017-08-03 DIAGNOSIS — F418 Other specified anxiety disorders: Secondary | ICD-10-CM

## 2017-08-03 DIAGNOSIS — I2699 Other pulmonary embolism without acute cor pulmonale: Secondary | ICD-10-CM | POA: Diagnosis not present

## 2017-08-03 DIAGNOSIS — Z1211 Encounter for screening for malignant neoplasm of colon: Secondary | ICD-10-CM | POA: Diagnosis not present

## 2017-08-03 NOTE — Assessment & Plan Note (Addendum)
1 month  H/o mass in left axilla, h/o left breast cancer and is concerned, 1 cm mass palpalable on exam, needs [place]

## 2017-08-03 NOTE — Patient Instructions (Addendum)
Please change July appt, she needs a wellness with Nurse in July  Appointment for physical exam with MD November 30 or after  Please follow through on your colonoscopy I will refer you again  You are referred for an US of the left armpit and we will call with appointment information.  Please  Stay out of bed later in the night and commit to more regular  Physical activity. Try and get out of the house more,need to be more active  Thank you  for choosing [facility]. We consider it a privelige to serve you.  Delivering excellent health care in a caring and  compassionate way is our goal.  Partnering with you,  so that together we can achieve this goal is our strategy.

## 2017-08-04 ENCOUNTER — Encounter (INDEPENDENT_AMBULATORY_CARE_PROVIDER_SITE_OTHER): Payer: Self-pay | Admitting: *Deleted

## 2017-08-05 ENCOUNTER — Other Ambulatory Visit: Payer: Self-pay

## 2017-08-05 ENCOUNTER — Other Ambulatory Visit: Payer: Self-pay | Admitting: Family Medicine

## 2017-08-05 DIAGNOSIS — R2232 Localized swelling, mass and lump, left upper limb: Secondary | ICD-10-CM

## 2017-08-08 ENCOUNTER — Encounter: Payer: Self-pay | Admitting: Family Medicine

## 2017-08-08 DIAGNOSIS — E66811 Obesity, class 1: Secondary | ICD-10-CM | POA: Insufficient documentation

## 2017-08-08 DIAGNOSIS — E669 Obesity, unspecified: Secondary | ICD-10-CM | POA: Insufficient documentation

## 2017-08-08 NOTE — Assessment & Plan Note (Signed)
No med change, pt encouraged to increase physical activity and to get out of the house

## 2017-08-08 NOTE — Assessment & Plan Note (Signed)
Improved. Patient re-educated about  the importance of commitment to a  minimum of 150 minutes of exercise per week.  The importance of healthy food choices with portion control discussed. Encouraged to start a food diary, count calories and to consider  joining a support group. Sample diet sheets offered. Goals set by the patient for the next several months.   Weight /BMI 08/03/2017 05/17/2017 04/13/2017  WEIGHT 189 lb 1.9 oz 191 lb 190 lb 1.3 oz  HEIGHT 5\' 6"  5\' 6"  5\' 7"   BMI 30.52 kg/m2 30.83 kg/m2 29.77 kg/m2

## 2017-08-08 NOTE — Assessment & Plan Note (Signed)
Controlled, no change in medication Hyperlipidemia:Low fat diet discussed and encouraged.   Lipid Panel  Lab Results  Component Value Date   CHOL 179 11/30/2016   HDL 65 11/30/2016   LDLCALC 90 11/30/2016   TRIG 142 11/30/2016   CHOLHDL 2.8 11/30/2016

## 2017-08-08 NOTE — Progress Notes (Signed)
Theresa Garcia     MRN: [id number]      DOB: 22-Jul-1940   HPI Theresa Garcia is here with a 3 week h/o lump in left armorist. She reports it as painful with pressure, denies drainage or warmth from the area. She does not shave and has hd no fever or chills. She has hd cleft breast cancer more than 10 years ago. She had a normal mammogram earlier this year. She presented with the identical complaint last year . She still spends more time in bed than out, going d in from 8 in the evening, and still remains demotivated as far as getting involved in community activities, she sates she is OK No problems with blood thinner  ROS Denies recent fever or chills. Denies sinus pressure, nasal congestion, ear pain or sore throat. Denies chest congestion, productive cough or wheezing. Denies chest pains, palpitations and leg swelling Denies abdominal pain, nausea, vomiting,diarrhea or constipation.   Denies dysuria, frequency, hesitancy or incontinence. Denies joint pain, swelling and limitation in mobility. Denies headaches, seizures, numbness, or tingling. Denies uncontrolled depression, anxiety or insomnia.    PE  BP 120/82   Pulse 81   Resp 16   Ht 5\' 6"  (1.676 m)   Wt 189 lb 1.9 oz (85.8 kg)   SpO2 97%   BMI 30.52 kg/m   Patient alert and oriented and in no cardiopulmonary distress.  HEENT: No facial asymmetry, EOMI,   oropharynx pink and moist.  Neck decreased though adequate ROM  Chest: Clear to auscultation bilaterally. Breast: no mass, no discharge possible left axillary lymphadenopathy, 1.5 cm, mildly tender, no erythemal or warmth, no right axillary node, no supraclavicular node CVS: S1, S2 no murmurs, no S3.Regular rate.  ABD: Soft non tender.   Ext: No edema  MS: Adequate ROM spine, shoulders, hips and knees.  Skin: Intact, no ulcerations or rash noted.  Psych: Good eye contact, normal affect. Memory intact not anxious or depressed appearing.  CNS: CN 2-12 intact,  power,  normal throughout.no focal deficits noted.   Assessment & Plan  Axillary mass, left 1 month  H/o mass in left axilla, h/o left breast cancer and is concerned, 1 cm mass palpalable on exam, needs [place]  Acute Bil pulmonary embolism ([place]) Patient currently on Eliquis with no complication  Essential hypertension Controlled, no change in medication DASH diet and commitment to daily physical activity for a minimum of 30 minutes discussed and encouraged, as a part of hypertension management. The importance of attaining a healthy weight is also discussed.  BP/Weight 08/03/2017 05/17/2017 04/13/2017 04/05/2017 03/25/2017 03/24/2017 61/60/7371  Systolic BP 062 694 854 627 035 009 381  Diastolic BP 82 74 80 70 90 80 80  Wt. (Lbs) 189.12 191 190.08 - - 201.94 194  BMI 30.52 30.83 29.77 - - 31.63 30.38       Hyperlipidemia Controlled, no change in medication Hyperlipidemia:Low fat diet discussed and encouraged.   Lipid Panel  Lab Results  Component Value Date   CHOL 179 11/30/2016   HDL 65 11/30/2016   LDLCALC 90 11/30/2016   TRIG 142 11/30/2016   CHOLHDL 2.8 11/30/2016       Obesity (BMI 30.0-34.9) Improved. Patient re-educated about  the importance of commitment to a  minimum of 150 minutes of exercise per week.  The importance of healthy food choices with portion control discussed. Encouraged to start a food diary, count calories and to consider  joining a support group. Sample diet sheets offered.  Goals set by the patient for the next several months.   Weight /BMI 08/03/2017 05/17/2017 04/13/2017  WEIGHT 189 lb 1.9 oz 191 lb 190 lb 1.3 oz  HEIGHT 5\' 6"  5\' 6"  5\' 7"   BMI 30.52 kg/m2 30.83 kg/m2 29.77 kg/m2      Depression with anxiety No med change, pt encouraged to increase physical activity and to get out of the house

## 2017-08-08 NOTE — Assessment & Plan Note (Signed)
Patient currently on Eliquis with no complication

## 2017-08-08 NOTE — Assessment & Plan Note (Signed)
Controlled, no change in medication DASH diet and commitment to daily physical activity for a minimum of 30 minutes discussed and encouraged, as a part of hypertension management. The importance of attaining a healthy weight is also discussed.  BP/Weight 08/03/2017 05/17/2017 04/13/2017 04/05/2017 03/25/2017 03/24/2017 00/76/2263  Systolic BP 335 456 256 389 373 428 768  Diastolic BP 82 74 80 70 90 80 80  Wt. (Lbs) 189.12 191 190.08 - - 201.94 194  BMI 30.52 30.83 29.77 - - 31.63 30.38

## 2017-08-09 ENCOUNTER — Other Ambulatory Visit: Payer: Self-pay | Admitting: Family Medicine

## 2017-08-10 ENCOUNTER — Ambulatory Visit (HOSPITAL_COMMUNITY)
Admission: RE | Admit: 2017-08-10 | Discharge: 2017-08-10 | Disposition: A | Discharge status: Home / Self Care | Payer: Medicare HMO | Source: Ambulatory Visit | Attending: Family Medicine | Admitting: Family Medicine

## 2017-08-10 DIAGNOSIS — R2232 Localized swelling, mass and lump, left upper limb: Secondary | ICD-10-CM

## 2017-08-10 DIAGNOSIS — N6489 Other specified disorders of breast: Secondary | ICD-10-CM | POA: Diagnosis not present

## 2017-08-10 DIAGNOSIS — Z853 Personal history of malignant neoplasm of breast: Secondary | ICD-10-CM | POA: Diagnosis not present

## 2017-08-30 ENCOUNTER — Other Ambulatory Visit: Payer: Self-pay | Admitting: Family Medicine

## 2017-09-13 ENCOUNTER — Other Ambulatory Visit: Payer: Self-pay | Admitting: Family Medicine

## 2017-09-13 ENCOUNTER — Ambulatory Visit (INDEPENDENT_AMBULATORY_CARE_PROVIDER_SITE_OTHER): Payer: Medicare HMO | Admitting: *Deleted

## 2017-09-13 VITALS — BP 120/64 | HR 83 | Temp 98.4°F | Resp 18 | Ht 66.0 in | Wt 190.0 lb

## 2017-09-13 DIAGNOSIS — Z0000 Encounter for general adult medical examination without abnormal findings: Secondary | ICD-10-CM

## 2017-09-13 NOTE — Progress Notes (Signed)
Subjective:   Theresa Garcia is a 77 y.o. female who presents for Medicare Annual (Subsequent) preventive examination.  Review of Systems:   Cardiac Risk Factors include: advanced age (>33mn, >>68women);dyslipidemia;hypertension;sedentary lifestyle     Objective:     Vitals: BP 120/64   Pulse 83   Temp 98.4 F (36.9 C)   Resp 18   Ht 5' 6"  (1.676 m)   Wt 190 lb (86.2 kg)   SpO2 95%   BMI 30.67 kg/m     Advanced Directives 09/13/2017 04/05/2017 03/25/2017 03/19/2017 09/07/2016 04/26/2015 04/26/2015  Does Patient Have a Medical Advance Directive? No Yes Yes No No No No  Type of Advance Directive - (No Data) (No Data) - - - -  Does patient want to make changes to medical advance directive? - No - Patient declined No - Patient declined - - - -  Would patient like information on creating a medical advance directive? Yes (ED - Information included in AVS) No - Patient declined No - Patient declined No - Patient declined Yes (MAU/Ambulatory/Procedural Areas - Information given) Yes - Spiritual care consult ordered No - patient declined information  Pre-existing out of facility DNR order (yellow form or pink MOST form) - - - - - - -    Tobacco Social History   Tobacco Use  Smoking Status Former Smoker  . Packs/day: 1.00  . Years: 5.00  . Pack years: 5.00  . Types: Cigarettes  . Last attempt to quit: 09/07/1980  . Years since quitting: 37.0  Smokeless Tobacco Never Used  Tobacco Comment   quit 25+ years ago     Counseling given: Not Answered Comment: quit 25+ years ago   Clinical Intake:  Pre-visit preparation completed: Yes  Pain : No/denies pain Pain Score: 0-No pain     Diabetes: No  How often do you need to have someone help you when you read instructions, pamphlets, or other written materials from your doctor or pharmacy?: 1 - Never What is the last grade level you completed in school?: some college  Interpreter Needed?: No     Past Medical History:    Diagnosis Date  . Bronchitis   [place]   . Chronic back pain   . Depression   . GERD (gastroesophageal reflux disease)   . Hyperlipidemia   . Hypertension   . Insomnia   . Kidney stone   . Personal history of chemotherapy   . Personal history of radiation therapy   . Pneumonia   . Vertigo    Past Surgical History:  Procedure Laterality Date  . ABDOMINAL HYSTERECTOMY    . BREAST LUMPECTOMY Left 2002  . BREAST SURGERY     left  . CYSTOSCOPY/RETROGRADE/URETEROSCOPY/STONE EXTRACTION WITH BASKET  01/07/2011   Procedure: CYSTOSCOPY/RETROGRADE/URETEROSCOPY/STONE EXTRACTION WITH BASKET;  Surgeon: MMarissa Nestle  Location: [facility];  Service: Urology;  Laterality: Left;  Balloon Dilatation; stone given to family per MD  . TUBAL LIGATION     Family History  Problem Relation Age of Onset  . COPD Father   . COPD Sister   . COPD Sister   . Diabetes Sister   . Diabetes Brother   . Seizures Mother   . Bone cancer Brother   . Heart disease Brother   . Aneurysm Brother        brain  . HIV/AIDS Brother    Social History   Socioeconomic History  . Marital status: Divorced    Spouse name: Not  on file  . Number of children: 2  . Years of education: Not on file  . Highest education level: Not on file  Occupational History  . Not on file  Social Needs  . Financial resource strain: Not hard at all  . Food insecurity:    Worry: Never true    Inability: Never true  . Transportation needs:    Medical: No    Non-medical: No  Tobacco Use  . Smoking status: Former Smoker    Packs/day: 1.00    Years: 5.00    Pack years: 5.00    Types: Cigarettes    Last attempt to quit: 09/07/1980    Years since quitting: 37.0  . Smokeless tobacco: Never Used  . Tobacco comment: quit 25+ years ago  Substance and Sexual Activity  . Alcohol use: No    Alcohol/week: 0.0 oz  . Drug use: No  . Sexual activity: Not Currently  Lifestyle  . Physical activity:    Days per week: 0 days     Minutes per session: 0 min  . Stress: Not at all  Relationships  . Social connections:    Talks on phone: More than three times a week    Gets together: Once a week    Attends religious service: Never    Active member of club or organization: No    Attends meetings of clubs or organizations: Never    Relationship status: Divorced  Other Topics Concern  . Not on file  Social History Narrative  . Not on file    Outpatient Encounter Medications as of 09/13/2017  Medication Sig  . acetaminophen (TYLENOL) 500 MG tablet Take 1,000 mg by mouth every 6 (six) hours as needed for mild pain or moderate pain.   Marland Kitchen acyclovir (ZOVIRAX) 400 MG tablet Take 1 tablet (400 mg total) by mouth 2 (two) times daily.  Marland Kitchen amLODipine-benazepril (LOTREL) 5-40 MG capsule TAKE 1 CAPSULE DAILY  . atorvastatin (LIPITOR) 20 MG tablet TAKE 1 TABLET EVERY EVENING  . ELIQUIS 5 MG TABS tablet TAKE 1 TABLET BY MOUTH TWICE DAILY  . Misc. Devices ([place]) MISC 1 each by Does not apply route daily as needed.  Marland Kitchen Respiratory Therapy Supplies (AIRS DISPOSABLE NEBULIZER) KIT 1 Units by Does not apply route daily as needed.  . temazepam (RESTORIL) 15 MG capsule TAKE 2 CAPSULES BY MOUTH AT BEDTIME AS NEEDED FOR SLEEP  . temazepam (RESTORIL) 30 MG capsule Take 1 capsule (30 mg total) by mouth at bedtime as needed for sleep.  . vitamin C (ASCORBIC ACID) 500 MG tablet Take 500 mg by mouth daily.  . Vitamin D, Ergocalciferol, (DRISDOL) 50000 units CAPS capsule Take one capsule once a week for 4 weeks, then one a month  . FLUoxetine (PROZAC) 10 MG capsule Take 1 capsule (10 mg total) by mouth daily.  . promethazine-dextromethorphan (PROMETHAZINE-DM) 6.25-15 MG/5ML syrup One teaspoon at bedtime only, for excessive cough (Patient not taking: Reported on 09/13/2017)  . senna-docusate (SENOKOT-S) 8.6-50 MG tablet Take 2 tablets by mouth at bedtime as needed for mild constipation or moderate constipation. (Patient not taking:  Reported on 09/13/2017)   No facility-administered encounter medications on file as of 09/13/2017.     Activities of Daily Living In your present state of health, do you have any difficulty performing the following activities: 09/13/2017 04/13/2017  Hearing? N N  Vision? N N  Difficulty concentrating or making decisions? N N  Walking or climbing stairs? N N  Dressing  or bathing? N N  Doing errands, shopping? N N  Preparing Food and eating ? N -  Using the Toilet? N -  In the past six months, have you accidently leaked urine? N -  Do you have problems with loss of bowel control? N -  Managing your Medications? N -  Managing your Finances? N -  Housekeeping or managing your Housekeeping? N -  Some recent data might be hidden    Patient Care Team: Fayrene Helper, MD as PCP - General    Assessment:   This is a routine wellness examination for Theresa Garcia.  Exercise Activities and Dietary recommendations Current Exercise Habits: The patient does not participate in regular exercise at present, Exercise limited by: None identified  Goals    . Exercise 3x per week (30 min per time)     Recommend starting a routine exercise program at least 3 days a week for 30-45 minutes at a time as tolerated.        Fall Risk Fall Risk  09/13/2017 08/03/2017 05/17/2017 04/13/2017 09/07/2016  Falls in the past year? No No No No No   Is the patient's home free of loose throw rugs in walkways, pet beds, electrical cords, etc?   yes      Grab bars in the bathroom? yes      Handrails on the stairs?   yes      Adequate lighting?   yes  Timed Get Up and Go performed:   Depression Screen PHQ 2/9 Scores 09/13/2017 08/03/2017 04/13/2017 09/07/2016  PHQ - 2 Score 0 0 0 0  PHQ- 9 Score - - - -     Cognitive Function MMSE - Mini Mental State Exam 05/27/2016 07/24/2015  Orientation to time 5 5  Orientation to Place 5 5  Registration 3 3  Attention/ Calculation 5 5  Recall 3 2  Language- name 2 objects 2 2    Language- repeat 1 1  Language- follow 3 step command 3 3  Language- read & follow direction 1 1  Write a sentence 1 1  Copy design 1 1  Total score 30 29     6CIT Screen 09/13/2017 09/07/2016  What Year? 0 points 0 points  What month? 0 points 0 points  What time? 0 points 0 points  Count back from 20 0 points 0 points  Months in reverse 0 points 0 points  Repeat phrase 0 points 0 points  Total Score 0 0    Immunization History  Administered Date(s) Administered  . Influenza Split 12/18/2010  . Influenza Whole 01/13/2010  . Influenza,inj,Quad PF,6+ Mos 11/28/2012, 11/27/2013, 12/17/2014, 12/30/2015, 11/30/2016  . Pneumococcal Conjugate-13 03/14/2014  . Pneumococcal Polysaccharide-23 10/10/2012    Qualifies for Shingles Vaccine?verify with insurance Screening Tests Health Maintenance  Topic Date Due  . COLONOSCOPY  02/21/1993  . TETANUS/TDAP  01/06/2018 (Originally [birth year]1962)  . INFLUENZA VACCINE  09/23/2017  . MAMMOGRAM  06/29/2019  . DEXA SCAN  Completed  . PNA vac Low Risk Adult  Completed    Cancer Screenings: Lung: Low Dose CT Chest recommended if Age 77-80 years, 30 pack-year currently smoking OR have quit w/in 15years. Patient does not qualify. Breast:  Up to date on Mammogram? Yes   Up to date of Bone Density/Dexa? Yes Colorectal: refuses   Additional Screenings:  Hepatitis C Screening:      Plan:     I have personally reviewed and noted the following in the patient's chart:   .  Medical and social history . Use of alcohol, tobacco or illicit drugs  . Current medications and supplements . Functional ability and status . Nutritional status . Physical activity . Advanced directives . List of other physicians . Hospitalizations, surgeries, and ER visits in previous 12 months . Vitals . Screenings to include cognitive, depression, and falls . Referrals and appointments  In addition, I have reviewed and discussed with patient certain preventive  protocols, quality metrics, and best practice recommendations. A written personalized care plan for preventive services as well as general preventive health recommendations were provided to patient.     Willette Pa, LPN  04/05/1733

## 2017-09-13 NOTE — Patient Instructions (Signed)
[name] , Thank you for taking time to come for your Medicare Wellness Visit. I appreciate your ongoing commitment to your health goals. Please review the following plan we discussed and let me know if I can assist you in the future.   Screening recommendations/referrals: Colonoscopy: refuses Mammogram: up to date Bone Density: up to date Recommended yearly ophthalmology/optometry visit for glaucoma screening and checkup  Recommended yearly dental visit for hygiene and checkup  Vaccinations: Influenza vaccine: due this fall  Pneumococcal vaccine: up to date Tdap vaccine: verify with insurance Shingles vaccine: verify with insurance    Advanced directives: will provide information  Conditions/risks identified: discussed during visit  Next appointment: Dec. 9, 2010 2:20   Preventive Care 75 Years and Older, Female Preventive care refers to lifestyle choices and visits with your health care provider that can promote health and wellness. What does preventive care include?  A yearly physical exam. This is also called an annual well check.  Dental exams once or twice a year.  Routine eye exams. Ask your health care provider how often you should have your eyes checked.  Personal lifestyle choices, including:  Daily care of your teeth and gums.  Regular physical activity.  Eating a healthy diet.  Avoiding tobacco and drug use.  Limiting alcohol use.  Practicing safe sex.  Taking low-dose aspirin every day.  Taking vitamin and mineral supplements as recommended by your health care provider. What happens during an annual well check? The services and screenings done by your health care provider during your annual well check will depend on your age, overall health, lifestyle risk factors, and family history of disease. Counseling  Your health care provider may ask you questions about your:  Alcohol use.  Tobacco use.  Drug use.  Emotional well-being.  Home and  relationship well-being.  Sexual activity.  Eating habits.  History of falls.  Memory and ability to understand (cognition).  Work and work Statistician.  Reproductive health. Screening  You may have the following tests or measurements:  Height, weight, and BMI.  Blood pressure.  Lipid and cholesterol levels. These may be checked every 5 years, or more frequently if you are over 99 years old.  Skin check.  Lung cancer screening. You may have this screening every year starting at age 25 if you have a 30-pack-year history of smoking and currently smoke or have quit within the past 15 years.  Fecal occult blood test (FOBT) of the stool. You may have this test every year starting at age 38.  Flexible sigmoidoscopy or colonoscopy. You may have a sigmoidoscopy every 5 years or a colonoscopy every 10 years starting at age 66.  Hepatitis C blood test.  Hepatitis B blood test.  Sexually transmitted disease (STD) testing.  Diabetes screening. This is done by checking your blood sugar (glucose) after you have not eaten for a while (fasting). You may have this done every 1-3 years.  Bone density scan. This is done to screen for osteoporosis. You may have this done starting at age 66.  Mammogram. This may be done every 1-2 years. Talk to your health care provider about how often you should have regular mammograms. Talk with your health care provider about your test results, treatment options, and if necessary, the need for more tests. Vaccines  Your health care provider may recommend certain vaccines, such as:  Influenza vaccine. This is recommended every year.  Tetanus, diphtheria, and acellular pertussis (Tdap, Td) vaccine. You may need a Td  booster every 10 years.  Zoster vaccine. You may need this after age 59.  Pneumococcal 13-valent conjugate (PCV13) vaccine. One dose is recommended after age 22.  Pneumococcal polysaccharide (PPSV23) vaccine. One dose is recommended after  age 35. Talk to your health care provider about which screenings and vaccines you need and how often you need them. This information is not intended to replace advice given to you by your health care provider. Make sure you discuss any questions you have with your health care provider. Document Released: 03/08/2015 Document Revised: 10/30/2015 Document Reviewed: 12/11/2014 Elsevier Interactive Patient Education  2017 [employer] Prevention in the Home Falls can cause injuries. They can happen to people of all ages. There are many things you can do to make your home safe and to help prevent falls. What can I do on the outside of my home?  Regularly fix the edges of walkways and driveways and fix any cracks.  Remove anything that might make you trip as you walk through a door, such as a raised step or threshold.  Trim any bushes or trees on the path to your home.  Use bright outdoor lighting.  Clear any walking paths of anything that might make someone trip, such as rocks or tools.  Regularly check to see if handrails are loose or broken. Make sure that both sides of any steps have handrails.  Any raised decks and porches should have guardrails on the edges.  Have any leaves, snow, or ice cleared regularly.  Use sand or salt on walking paths during winter.  Clean up any spills in your garage right away. This includes oil or grease spills. What can I do in the bathroom?  Use night lights.  Install grab bars by the toilet and in the tub and shower. Do not use towel bars as grab bars.  Use non-skid mats or decals in the tub or shower.  If you need to sit down in the shower, use a plastic, non-slip stool.  Keep the floor dry. Clean up any water that spills on the floor as soon as it happens.  Remove soap buildup in the tub or shower regularly.  Attach bath mats securely with double-sided non-slip rug tape.  Do not have throw rugs and other things on the floor that can  make you trip. What can I do in the bedroom?  Use night lights.  Make sure that you have a light by your bed that is easy to reach.  Do not use any sheets or blankets that are too big for your bed. They should not hang down onto the floor.  Have a firm chair that has side arms. You can use this for support while you get dressed.  Do not have throw rugs and other things on the floor that can make you trip. What can I do in the kitchen?  Clean up any spills right away.  Avoid walking on wet floors.  Keep items that you use a lot in easy-to-reach places.  If you need to reach something above you, use a strong step stool that has a grab bar.  Keep electrical cords out of the way.  Do not use floor polish or wax that makes floors slippery. If you must use wax, use non-skid floor wax.  Do not have throw rugs and other things on the floor that can make you trip. What can I do with my stairs?  Do not leave any items on the stairs.  Make sure that there are handrails on both sides of the stairs and use them. Fix handrails that are broken or loose. Make sure that handrails are as long as the stairways.  Check any carpeting to make sure that it is firmly attached to the stairs. Fix any carpet that is loose or worn.  Avoid having throw rugs at the top or bottom of the stairs. If you do have throw rugs, attach them to the floor with carpet tape.  Make sure that you have a light switch at the top of the stairs and the bottom of the stairs. If you do not have them, ask someone to add them for you. What else can I do to help prevent falls?  Wear shoes that:  Do not have high heels.  Have rubber bottoms.  Are comfortable and fit you well.  Are closed at the toe. Do not wear sandals.  If you use a stepladder:  Make sure that it is fully opened. Do not climb a closed stepladder.  Make sure that both sides of the stepladder are locked into place.  Ask someone to hold it for you,  if possible.  Clearly mark and make sure that you can see:  Any grab bars or handrails.  First and last steps.  Where the edge of each step is.  Use tools that help you move around (mobility aids) if they are needed. These include:  Canes.  Walkers.  Scooters.  Crutches.  Turn on the lights when you go into a dark area. Replace any light bulbs as soon as they burn out.  Set up your furniture so you have a clear path. Avoid moving your furniture around.  If any of your floors are uneven, fix them.  If there are any pets around you, be aware of where they are.  Review your medicines with your doctor. Some medicines can make you feel dizzy. This can increase your chance of falling. Ask your doctor what other things that you can do to help prevent falls. This information is not intended to replace advice given to you by your health care provider. Make sure you discuss any questions you have with your health care provider. Document Released: 12/06/2008 Document Revised: 07/18/2015 Document Reviewed: 03/16/2014 Elsevier Interactive Patient Education  2017 [employer].

## 2017-09-20 ENCOUNTER — Ambulatory Visit: Payer: Self-pay | Admitting: Family Medicine

## 2017-09-20 ENCOUNTER — Ambulatory Visit: Payer: Self-pay

## 2017-09-27 ENCOUNTER — Telehealth: Payer: Self-pay | Admitting: Family Medicine

## 2017-09-27 ENCOUNTER — Other Ambulatory Visit: Payer: Self-pay

## 2017-09-27 MED ORDER — ATORVASTATIN CALCIUM 20 MG PO TABS: 20.0000 mg | ORAL_TABLET | Freq: Every evening | ORAL | 1 refills | Status: DC

## 2017-09-27 MED ORDER — AMLODIPINE BESY-BENAZEPRIL HCL 5-40 MG PO CAPS: 1.0000 | ORAL_CAPSULE | Freq: Every day | ORAL | 1 refills | Status: DC

## 2017-09-27 NOTE — Telephone Encounter (Signed)
Refill sent in

## 2017-09-27 NOTE — Telephone Encounter (Signed)
Needs rx refill on : atorvastatin (LIPITOR) 20 MG tablet amLODipine-benazepril (LOTREL) 5-40 MG capsule

## 2017-10-25 ENCOUNTER — Other Ambulatory Visit: Payer: Self-pay | Admitting: Family Medicine

## 2017-11-01 ENCOUNTER — Other Ambulatory Visit: Payer: Self-pay

## 2017-11-01 ENCOUNTER — Other Ambulatory Visit: Payer: Self-pay | Admitting: Family Medicine

## 2017-11-01 MED ORDER — TEMAZEPAM 30 MG PO CAPS: 30.0000 mg | ORAL_CAPSULE | Freq: Every evening | ORAL | 4 refills | Status: DC | PRN

## 2017-11-01 MED ORDER — ACYCLOVIR 400 MG PO TABS: 400.0000 mg | ORAL_TABLET | Freq: Two times a day (BID) | ORAL | 5 refills | Status: DC

## 2017-11-11 ENCOUNTER — Other Ambulatory Visit: Payer: Self-pay | Admitting: Family Medicine

## 2017-11-28 ENCOUNTER — Other Ambulatory Visit: Payer: Self-pay | Admitting: Family Medicine

## 2017-11-29 ENCOUNTER — Other Ambulatory Visit: Payer: Self-pay | Admitting: Family Medicine

## 2017-11-29 MED ORDER — APIXABAN 5 MG PO TABS: 5.0000 mg | ORAL_TABLET | Freq: Two times a day (BID) | ORAL | 2 refills | Status: DC

## 2018-01-31 ENCOUNTER — Encounter: Payer: Self-pay | Admitting: Family Medicine

## 2018-02-08 DIAGNOSIS — H25813 Combined forms of age-related cataract, bilateral: Secondary | ICD-10-CM | POA: Diagnosis not present

## 2018-02-08 DIAGNOSIS — I10 Essential (primary) hypertension: Secondary | ICD-10-CM | POA: Diagnosis not present

## 2018-02-08 DIAGNOSIS — H5200 Hypermetropia, unspecified eye: Secondary | ICD-10-CM | POA: Diagnosis not present

## 2018-03-25 ENCOUNTER — Other Ambulatory Visit: Payer: Self-pay | Admitting: Family Medicine

## 2018-03-26 ENCOUNTER — Other Ambulatory Visit: Payer: Self-pay | Admitting: Family Medicine

## 2018-03-27 ENCOUNTER — Other Ambulatory Visit: Payer: Self-pay | Admitting: Family Medicine

## 2018-04-23 ENCOUNTER — Other Ambulatory Visit: Payer: Self-pay | Admitting: Family Medicine

## 2018-05-02 ENCOUNTER — Other Ambulatory Visit: Payer: Self-pay | Admitting: Family Medicine

## 2018-05-03 ENCOUNTER — Ambulatory Visit (INDEPENDENT_AMBULATORY_CARE_PROVIDER_SITE_OTHER): Payer: Medicare HMO | Admitting: Family Medicine

## 2018-05-03 ENCOUNTER — Encounter: Payer: Self-pay | Admitting: Family Medicine

## 2018-05-03 VITALS — BP 118/84 | HR 76 | Resp 15 | Ht 66.0 in | Wt 195.0 lb

## 2018-05-03 DIAGNOSIS — F5105 Insomnia due to other mental disorder: Secondary | ICD-10-CM

## 2018-05-03 DIAGNOSIS — F99 Mental disorder, not otherwise specified: Secondary | ICD-10-CM

## 2018-05-03 DIAGNOSIS — E669 Obesity, unspecified: Secondary | ICD-10-CM | POA: Diagnosis not present

## 2018-05-03 DIAGNOSIS — F418 Other specified anxiety disorders: Secondary | ICD-10-CM

## 2018-05-03 DIAGNOSIS — E7849 Other hyperlipidemia: Secondary | ICD-10-CM | POA: Diagnosis not present

## 2018-05-03 DIAGNOSIS — E559 Vitamin D deficiency, unspecified: Secondary | ICD-10-CM | POA: Diagnosis not present

## 2018-05-03 DIAGNOSIS — I10 Essential (primary) hypertension: Secondary | ICD-10-CM

## 2018-05-03 DIAGNOSIS — R69 Illness, unspecified: Secondary | ICD-10-CM | POA: Diagnosis not present

## 2018-05-03 MED ORDER — AMLODIPINE BESY-BENAZEPRIL HCL 5-40 MG PO CAPS: 1.0000 | ORAL_CAPSULE | Freq: Every day | ORAL | 1 refills | Status: DC

## 2018-05-03 MED ORDER — FLUOXETINE HCL 10 MG PO CAPS: 10.0000 mg | ORAL_CAPSULE | Freq: Every day | ORAL | 1 refills | Status: DC

## 2018-05-03 MED ORDER — TEMAZEPAM 30 MG PO CAPS: ORAL_CAPSULE | ORAL | 4 refills | Status: DC

## 2018-05-03 MED ORDER — ATORVASTATIN CALCIUM 20 MG PO TABS: 20.0000 mg | ORAL_TABLET | Freq: Every evening | ORAL | 1 refills | Status: DC

## 2018-05-03 NOTE — Patient Instructions (Addendum)
Annual physical exam end July, call iof you need me before  I will be in touch with Arnettie re your colonscopy   Fasting CBc, lipid, cmp and EGFrand tSH, and vit D and cBC this week   Please start calcium 1200 mg one daily with vit D 1000 IU daily, boes are thinning'

## 2018-05-07 ENCOUNTER — Encounter: Payer: Self-pay | Admitting: Family Medicine

## 2018-05-07 NOTE — Progress Notes (Signed)
ELOINA ERGLE     MRN: [id number]      DOB: 11/02/1940   HPI Theresa Garcia is here for follow up and re-evaluation of chronic medical conditions, medication management and review of any available recent lab and radiology data.  Preventive health is updated, specifically  Cancer screening and Immunization.   Questions or concerns regarding consultations or procedures which the PT has had in the interim are  addressed. The PT denies any adverse reactions to current medications since the last visit.  There are no new concerns.  There are no specific complaints   ROS Denies recent fever or chills. Denies sinus pressure, nasal congestion, ear pain or sore throat. Denies chest congestion, productive cough or wheezing. Denies chest pains, palpitations and leg swelling Denies abdominal pain, nausea, vomiting,diarrhea or constipation.   Denies dysuria, frequency, hesitancy or incontinence. Denies joint pain, swelling and limitation in mobility. Denies headaches, seizures, numbness, or tingling. Denies depression, anxiety or insomnia. Denies skin break down or rash.   PE  BP 118/84   Pulse 76   Resp 15   Ht 5\' 6"  (1.676 m)   Wt 195 lb (88.5 kg)   SpO2 97%   BMI 31.47 kg/m   Patient alert and oriented and in no cardiopulmonary distress.  HEENT: No facial asymmetry, EOMI,   oropharynx pink and moist.  Neck supple no JVD, no mass.  Chest: Clear to auscultation bilaterally.  CVS: S1, S2 no murmurs, no S3.Regular rate.  ABD: Soft non tender.   Ext: No edema  MS: Adequate ROM spine, shoulders, hips and knees.  Skin: Intact, no ulcerations or rash noted.  Psych: Good eye contact, normal affect. Memory impaired not anxious or depressed appearing.  CNS: CN 2-12 intact, power,  normal throughout.no focal deficits noted.   Assessment & Plan  Essential hypertension Controlled, no change in medication DASH diet and commitment to daily physical activity for a minimum of 30  minutes discussed and encouraged, as a part of hypertension management. The importance of attaining a healthy weight is also discussed.  BP/Weight 05/03/2018 09/13/2017 08/03/2017 05/17/2017 04/13/2017 04/05/2017 6/62/9476  Systolic BP 546 503 546 568 127 517 001  Diastolic BP 84 64 82 74 80 70 90  Wt. (Lbs) 195 190 189.12 191 190.08 - -  BMI 31.47 30.67 30.52 30.83 29.77 - -       Hyperlipidemia Hyperlipidemia:Low fat diet discussed and encouraged.   Lipid Panel  Lab Results  Component Value Date   CHOL 179 11/30/2016   HDL 65 11/30/2016   LDLCALC 90 11/30/2016   TRIG 142 11/30/2016   CHOLHDL 2.8 11/30/2016       Obesity (BMI 30.0-34.9) Deteriorated.  Patient re-educated about  the importance of commitment to a  minimum of 150 minutes of exercise per week as able.  The importance of healthy food choices with portion control discussed, as well as eating regularly and within a 12 hour window most days. The need to choose "clean , green" food 50 to 75% of the time is discussed, as well as to make water the primary drink and set a goal of 64 ounces water daily.  Encouraged to start a food diary,  and to consider  joining a support group. Sample diet sheets offered. Goals set by the patient for the next several months.   Weight /BMI 05/03/2018 09/13/2017 08/03/2017  WEIGHT 195 lb 190 lb 189 lb 1.9 oz  HEIGHT 5\' 6"  5\' 6"  5\' 6"   BMI 31.47  kg/m2 30.67 kg/m2 30.52 kg/m2      Depression with anxiety Controlled, no change in medication   Insomnia Sleep hygiene reviewed and written information offered also. Prescription sent for  medication needed.

## 2018-05-07 NOTE — Assessment & Plan Note (Signed)
Controlled, no change in medication  

## 2018-05-07 NOTE — Assessment & Plan Note (Signed)
Controlled, no change in medication DASH diet and commitment to daily physical activity for a minimum of 30 minutes discussed and encouraged, as a part of hypertension management. The importance of attaining a healthy weight is also discussed.  BP/Weight 05/03/2018 09/13/2017 08/03/2017 05/17/2017 04/13/2017 04/05/2017 9/45/0388  Systolic BP 828 003 [phone] 948  Diastolic BP 84 64 82 74 80 70 90  Wt. (Lbs) 195 190 189.12 191 190.08 - -  BMI 31.47 30.67 30.52 30.83 29.77 - -

## 2018-05-07 NOTE — Assessment & Plan Note (Signed)
Sleep hygiene reviewed and written information offered also. Prescription sent for  medication needed.  

## 2018-05-07 NOTE — Assessment & Plan Note (Signed)
Hyperlipidemia:Low fat diet discussed and encouraged.   Lipid Panel  Lab Results  Component Value Date   CHOL 179 11/30/2016   HDL 65 11/30/2016   LDLCALC 90 11/30/2016   TRIG 142 11/30/2016   CHOLHDL 2.8 11/30/2016

## 2018-05-07 NOTE — Assessment & Plan Note (Signed)
Deteriorated.  Patient re-educated about  the importance of commitment to a  minimum of 150 minutes of exercise per week as able.  The importance of healthy food choices with portion control discussed, as well as eating regularly and within a 12 hour window most days. The need to choose "clean , green" food 50 to 75% of the time is discussed, as well as to make water the primary drink and set a goal of 64 ounces water daily.  Encouraged to start a food diary,  and to consider  joining a support group. Sample diet sheets offered. Goals set by the patient for the next several months.   Weight /BMI 05/03/2018 09/13/2017 08/03/2017  WEIGHT 195 lb 190 lb 189 lb 1.9 oz  HEIGHT 5\' 6"  5\' 6"  5\' 6"   BMI 31.47 kg/m2 30.67 kg/m2 30.52 kg/m2

## 2018-05-09 ENCOUNTER — Telehealth: Payer: Self-pay

## 2018-05-09 ENCOUNTER — Telehealth: Payer: Self-pay | Admitting: Family Medicine

## 2018-05-09 DIAGNOSIS — Z1231 Encounter for screening mammogram for malignant neoplasm of breast: Secondary | ICD-10-CM

## 2018-05-09 DIAGNOSIS — N3000 Acute cystitis without hematuria: Secondary | ICD-10-CM

## 2018-05-09 DIAGNOSIS — Z1211 Encounter for screening for malignant neoplasm of colon: Secondary | ICD-10-CM

## 2018-05-09 NOTE — Telephone Encounter (Signed)
Pls arrange colo guard test for pt, stated that she wanted that over colonoscopy , and aggred with her sister present ( Arnettie) to do this test

## 2018-05-09 NOTE — Telephone Encounter (Signed)
Urine sent to lab 

## 2018-05-10 ENCOUNTER — Other Ambulatory Visit: Payer: Self-pay | Admitting: Family Medicine

## 2018-05-10 ENCOUNTER — Other Ambulatory Visit (HOSPITAL_COMMUNITY)
Admission: RE | Admit: 2018-05-10 | Discharge: 2018-05-10 | Disposition: A | Discharge status: Home / Self Care | Payer: Medicare HMO | Source: Ambulatory Visit | Attending: Family Medicine | Admitting: Family Medicine

## 2018-05-10 DIAGNOSIS — N3000 Acute cystitis without hematuria: Secondary | ICD-10-CM | POA: Insufficient documentation

## 2018-05-10 LAB — URINALYSIS, ROUTINE W REFLEX MICROSCOPIC
Bilirubin Urine: NEGATIVE
Glucose, UA: NEGATIVE mg/dL
Hgb urine dipstick: NEGATIVE
Ketones, ur: NEGATIVE mg/dL
Nitrite: NEGATIVE
Protein, ur: NEGATIVE mg/dL
Specific Gravity, Urine: 1.015 (ref 1.005–1.030)
pH: 5 (ref 5.0–8.0)

## 2018-05-10 MED ORDER — CIPROFLOXACIN HCL 500 MG PO TABS: 500.0000 mg | ORAL_TABLET | Freq: Two times a day (BID) | ORAL | 0 refills | Status: DC

## 2018-05-10 NOTE — Addendum Note (Signed)
Addended by: Eual Fines on: 05/10/2018 08:40 AM   Modules accepted: Orders

## 2018-05-10 NOTE — Progress Notes (Signed)
cipro

## 2018-05-10 NOTE — Telephone Encounter (Signed)
cologuard sent in for patient

## 2018-05-13 LAB — URINE CULTURE: Culture: >=100000 — AB

## 2018-05-22 ENCOUNTER — Other Ambulatory Visit: Payer: Self-pay | Admitting: Family Medicine

## 2018-05-23 ENCOUNTER — Other Ambulatory Visit: Payer: Self-pay

## 2018-05-23 MED ORDER — APIXABAN 5 MG PO TABS: 5.0000 mg | ORAL_TABLET | Freq: Two times a day (BID) | ORAL | 3 refills | Status: DC

## 2018-05-23 NOTE — Telephone Encounter (Signed)
Should we be refilling this or cardio?

## 2018-06-13 ENCOUNTER — Encounter: Payer: Self-pay | Admitting: Family Medicine

## 2018-06-23 ENCOUNTER — Other Ambulatory Visit: Payer: Self-pay | Admitting: Family Medicine

## 2018-07-17 ENCOUNTER — Other Ambulatory Visit: Payer: Self-pay | Admitting: Family Medicine

## 2018-07-19 IMAGING — US US AXILLARY LEFT
1 series · 2 of 2 positions shown · non-contrast
Comparison: Previous exam(s).

CLINICAL DATA: 76-year-old female with history of left breast
cancer breast cancer presenting with a left axillary palpable
abnormality. The patient states she has felt this area for several
months and it may be increasing.

EXAM:
DIGITAL DIAGNOSTIC LEFT MAMMOGRAM WITH CAD AND TOMO
ULTRASOUND LEFT BREAST

[Series 1: us axillary left · 0.07mm/px · 2 of 2 slices shown]
[im 1/2]
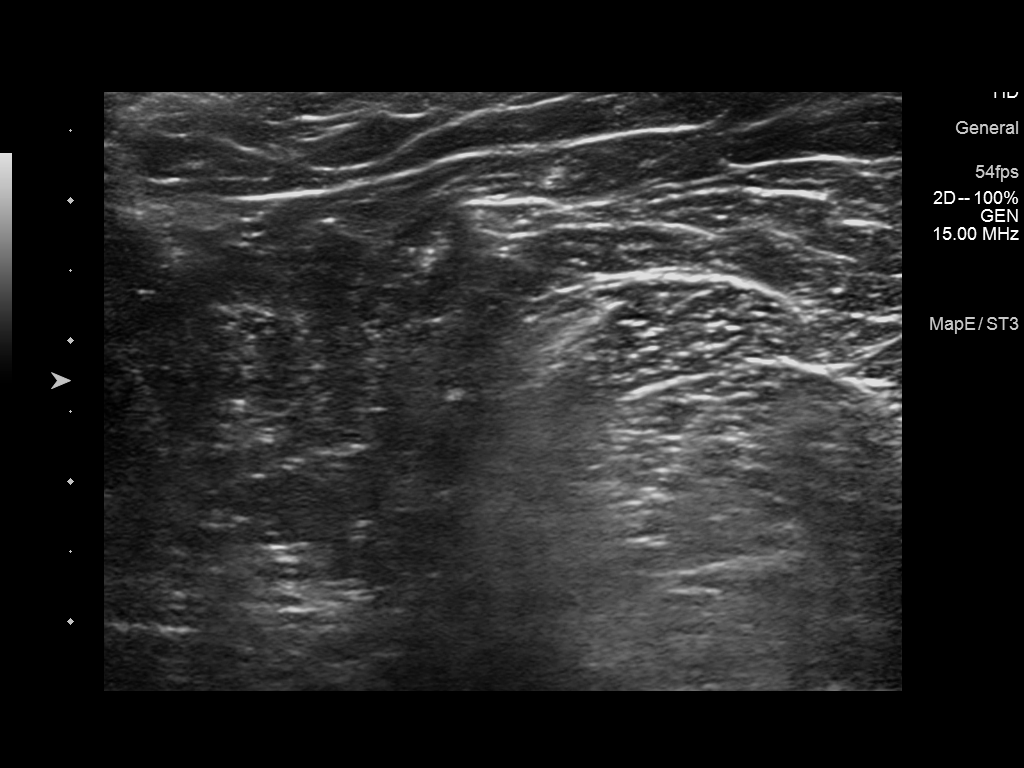
[im 2/2]
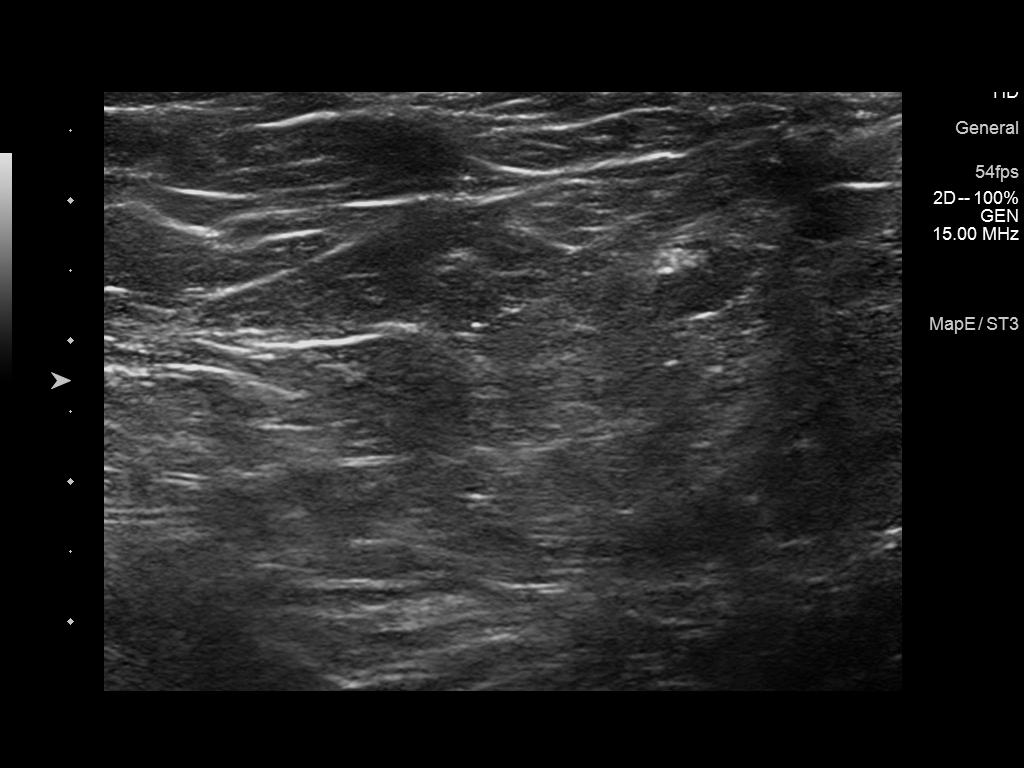

[2 of 2 positions shown; findings below may reference images not displayed]

ACR Breast Density Category b: There are scattered areas of
fibroglandular density.
FINDINGS: Radiopaque BB was placed at the site of the patient's palpable
abnormality in the left axilla. While this is along the edge of the
field of view, no definitive mammographic abnormality is seen deep
to the radiopaque BB. No suspicious findings are identified in the
remainder of the left breast. The parenchymal pattern is stable.

Mammographic images were processed with CAD.

On physical exam, I palpate dense soft tissue without focal lump in
the left axilla.

Targeted ultrasound is performed, showing normal axillary tissue
without focal or suspicious sonographic findings corresponding with
the patient's palpable lump.
IMPRESSION: No suspicious mammographic or sonographic findings to explain the
patient's left axillary palpable abnormality. Recommendation is for
clinical and symptomatic follow-up.

RECOMMENDATION:
1. Clinical follow-up recommended for the palpable area of concern
in the left axilla. Any further workup should be based on clinical
grounds.

I have discussed the findings and recommendations with the patient.
Results were also provided in writing at the conclusion of the
visit. If applicable, a reminder letter will be sent to the patient
regarding the next appointment.

BI-RADS CATEGORY  1: Negative.

## 2018-08-17 ENCOUNTER — Ambulatory Visit (HOSPITAL_COMMUNITY)
Admission: RE | Admit: 2018-08-17 | Discharge: 2018-08-17 | Disposition: A | Discharge status: Home / Self Care | Payer: Medicare HMO | Source: Ambulatory Visit | Attending: Family Medicine | Admitting: Family Medicine

## 2018-08-17 ENCOUNTER — Other Ambulatory Visit: Payer: Self-pay | Admitting: Family Medicine

## 2018-08-17 ENCOUNTER — Other Ambulatory Visit: Payer: Self-pay

## 2018-08-17 DIAGNOSIS — Z1231 Encounter for screening mammogram for malignant neoplasm of breast: Secondary | ICD-10-CM

## 2018-09-16 ENCOUNTER — Ambulatory Visit: Payer: Self-pay | Admitting: Family Medicine

## 2018-09-19 ENCOUNTER — Ambulatory Visit: Payer: Self-pay

## 2018-09-20 ENCOUNTER — Encounter: Payer: Self-pay | Admitting: Family Medicine

## 2018-09-20 ENCOUNTER — Ambulatory Visit (INDEPENDENT_AMBULATORY_CARE_PROVIDER_SITE_OTHER): Payer: Medicare HMO | Admitting: Family Medicine

## 2018-09-20 ENCOUNTER — Other Ambulatory Visit: Payer: Self-pay

## 2018-09-20 VITALS — BP 118/84 | HR 76 | Resp 15 | Ht 66.0 in | Wt 195.0 lb

## 2018-09-20 DIAGNOSIS — Z0000 Encounter for general adult medical examination without abnormal findings: Secondary | ICD-10-CM | POA: Diagnosis not present

## 2018-09-20 NOTE — Progress Notes (Signed)
Subjective:   Theresa Garcia is a 78 y.o. female who presents for Medicare Annual (Subsequent) preventive examination.   Location of Patient: Home Location of Provider: Telehealth Consent was obtain for visit to be over via telehealth. I verified that I am speaking with the correct person using two identifiers.   Review of Systems:    Cardiac Risk Factors include: advanced age (>71mn, >>93women);dyslipidemia;hypertension;obesity (BMI >30kg/m2)     Objective:     Vitals: BP 118/84   Pulse 76   Resp 15   Ht 5' 6"  (1.676 m)   Wt 195 lb (88.5 kg)   BMI 31.47 kg/m   Body mass index is 31.47 kg/m.  Advanced Directives 09/13/2017 04/05/2017 03/25/2017 03/19/2017 09/07/2016 04/26/2015 04/26/2015  Does Patient Have a Medical Advance Directive? No Yes Yes No No No No  Type of Advance Directive - (No Data) (No Data) - - - -  Does patient want to make changes to medical advance directive? - No - Patient declined No - Patient declined - - - -  Would patient like information on creating a medical advance directive? Yes (ED - Information included in AVS) No - Patient declined No - Patient declined No - Patient declined Yes (MAU/Ambulatory/Procedural Areas - Information given) Yes - Spiritual care consult ordered No - patient declined information  Pre-existing out of facility DNR order (yellow form or pink MOST form) - - - - - - -    Tobacco Social History   Tobacco Use  Smoking Status Former Smoker  . Packs/day: 1.00  . Years: 5.00  . Pack years: 5.00  . Types: Cigarettes  . Quit date: 09/07/1980  . Years since quitting: 38.0  Smokeless Tobacco Never Used  Tobacco Comment   quit 25+ years ago     Counseling given: Yes Comment: quit 25+ years ago   Clinical Intake:  Pre-visit preparation completed: Yes  Pain : No/denies pain Pain Score: 0-No pain     BMI - recorded: 31.47 Nutritional Status: BMI > 30  Obese Nutritional Risks: None Diabetes: No  How often do you need  to have someone help you when you read instructions, pamphlets, or other written materials from your doctor or pharmacy?: 1 - Never What is the last grade level you completed in school?: 12  Interpreter Needed?: No     Past Medical History:  Diagnosis Date  . Bronchitis   . Cancer ([place]   . Chronic back pain   . Depression   . GERD (gastroesophageal reflux disease)   . Hyperlipidemia   . Hypertension   . Insomnia   . Kidney stone   . Personal history of chemotherapy   . Personal history of radiation therapy   . Pneumonia   . Vertigo    Past Surgical History:  Procedure Laterality Date  . ABDOMINAL HYSTERECTOMY    . BREAST LUMPECTOMY Left 2002  . BREAST SURGERY     left  . CYSTOSCOPY/RETROGRADE/URETEROSCOPY/STONE EXTRACTION WITH BASKET  01/07/2011   Procedure: CYSTOSCOPY/RETROGRADE/URETEROSCOPY/STONE EXTRACTION WITH BASKET;  Surgeon: MMarissa Nestle  Location: [facility];  Service: Urology;  Laterality: Left;  Balloon Dilatation; stone given to family per MD  . TUBAL LIGATION     Family History  Problem Relation Age of Onset  . COPD Father   . COPD Sister   . COPD Sister   . Diabetes Sister   . Diabetes Brother   . Seizures Mother   . Bone cancer Brother   . Heart  disease Brother   . Aneurysm Brother        brain  . HIV/AIDS Brother    Social History   Socioeconomic History  . Marital status: Divorced    Spouse name: Not on file  . Number of children: 2  . Years of education: Not on file  . Highest education level: Not on file  Occupational History  . Not on file  Social Needs  . Financial resource strain: Not hard at all  . Food insecurity    Worry: Never true    Inability: Never true  . Transportation needs    Medical: No    Non-medical: No  Tobacco Use  . Smoking status: Former Smoker    Packs/day: 1.00    Years: 5.00    Pack years: 5.00    Types: Cigarettes    Quit date: 09/07/1980    Years since quitting: 38.0  . Smokeless tobacco: Never Used   . Tobacco comment: quit 25+ years ago  Substance and Sexual Activity  . Alcohol use: No    Alcohol/week: 0.0 standard drinks  . Drug use: No  . Sexual activity: Not Currently  Lifestyle  . Physical activity    Days per week: 0 days    Minutes per session: 0 min  . Stress: Not at all  Relationships  . Social connections    Talks on phone: More than three times a week    Gets together: Once a week    Attends religious service: Never    Active member of club or organization: No    Attends meetings of clubs or organizations: Never    Relationship status: Divorced  Other Topics Concern  . Not on file  Social History Narrative  . Not on file    Outpatient Encounter Medications as of 09/20/2018  Medication Sig  . acetaminophen (TYLENOL) 500 MG tablet Take 1,000 mg by mouth every 6 (six) hours as needed for mild pain or moderate pain.   Marland Kitchen acyclovir (ZOVIRAX) 400 MG tablet Take 1 tablet by mouth twice daily  . amLODipine-benazepril (LOTREL) 5-40 MG capsule Take 1 capsule by mouth daily.  Marland Kitchen apixaban (ELIQUIS) 5 MG TABS tablet Take 1 tablet (5 mg total) by mouth 2 (two) times daily.  Marland Kitchen atorvastatin (LIPITOR) 20 MG tablet Take 1 tablet (20 mg total) by mouth every evening.  Marland Kitchen FLUoxetine (PROZAC) 10 MG capsule Take 1 capsule (10 mg total) by mouth daily.  . Misc. Devices (Fanshawe) MISC 1 each by Does not apply route daily as needed.  Marland Kitchen Respiratory Therapy Supplies (AIRS DISPOSABLE NEBULIZER) KIT 1 Units by Does not apply route daily as needed.  . temazepam (RESTORIL) 30 MG capsule TAKE 1 CAPSULE BY MOUTH AT BEDTIME AS NEEDED FOR SLEEP  . vitamin C (ASCORBIC ACID) 500 MG tablet Take 500 mg by mouth daily.  . [DISCONTINUED] ciprofloxacin (CIPRO) 500 MG tablet Take 1 tablet (500 mg total) by mouth 2 (two) times daily. (Patient not taking: Reported on 09/20/2018)  . [DISCONTINUED] ELIQUIS 5 MG TABS tablet Take 1 tablet by mouth twice daily (Patient not taking: Reported on 09/20/2018)    No facility-administered encounter medications on file as of 09/20/2018.     Activities of Daily Living In your present state of health, do you have any difficulty performing the following activities: 09/20/2018  Hearing? N  Vision? N  Difficulty concentrating or making decisions? N  Walking or climbing stairs? N  Dressing or bathing? N  Doing  errands, shopping? N  Preparing Food and eating ? N  Using the Toilet? N  In the past six months, have you accidently leaked urine? N  Do you have problems with loss of bowel control? N  Managing your Medications? N  Managing your Finances? N  Housekeeping or managing your Housekeeping? N  Some recent data might be hidden    Patient Care Team: Fayrene Helper, MD as PCP - General    Assessment:   This is a routine wellness examination for Merrilee.  Exercise Activities and Dietary recommendations Current Exercise Habits: The patient does not participate in regular exercise at present, Exercise limited by: None identified  Goals    . Exercise 3x per week (30 min per time)     Recommend starting a routine exercise program at least 3 days a week for 30-45 minutes at a time as tolerated.        Fall Risk Fall Risk  09/20/2018 05/03/2018 09/13/2017 08/03/2017 05/17/2017  Falls in the past year? 0 0 No No No  Number falls in past yr: - 0 - - -  Injury with Fall? 0 0 - - -   Is the patient's home free of loose throw rugs in walkways, pet beds, electrical cords, etc?   yes      Grab bars in the bathroom? yes      Handrails on the stairs?   yes      Adequate lighting?   yes     Depression Screen PHQ 2/9 Scores 09/20/2018 05/03/2018 09/13/2017 08/03/2017  PHQ - 2 Score 0 0 0 0  PHQ- 9 Score - 1 - -     Cognitive Function MMSE - Mini Mental State Exam 05/27/2016 07/24/2015  Orientation to time 5 5  Orientation to Place 5 5  Registration 3 3  Attention/ Calculation 5 5  Recall 3 2  Language- name 2 objects 2 2  Language- repeat 1 1   Language- follow 3 step command 3 3  Language- read & follow direction 1 1  Write a sentence 1 1  Copy design 1 1  Total score 30 29     6CIT Screen 09/20/2018 09/13/2017 09/07/2016  What Year? 0 points 0 points 0 points  What month? 0 points 0 points 0 points  What time? 0 points 0 points 0 points  Count back from 20 0 points 0 points 0 points  Months in reverse 0 points 0 points 0 points  Repeat phrase 2 points 0 points 0 points  Total Score 2 0 0    Immunization History  Administered Date(s) Administered  . Influenza Split 12/18/2010  . Influenza Whole 01/13/2010  . Influenza,inj,Quad PF,6+ Mos 11/28/2012, 11/27/2013, 12/17/2014, 12/30/2015, 11/30/2016  . Pneumococcal Conjugate-13 03/14/2014  . Pneumococcal Polysaccharide-23 10/10/2012    Qualifies for Shingles Vaccine? She reports getting this somewhere she doesn't know where.  Screening Tests Health Maintenance  Topic Date Due  . TETANUS/TDAP  02/21/1962  . COLONOSCOPY  02/21/1993  . INFLUENZA VACCINE  09/24/2018  . DEXA SCAN  Completed  . PNA vac Low Risk Adult  Completed    Cancer Screenings: Lung: Low Dose CT Chest recommended if Age 54-80 years, 30 pack-year currently smoking OR have quit w/in 15years. Patient does not qualify. Breast:  Up to date on Mammogram? Yes   Up to date of Bone Density/Dexa? Yes Colorectal: reports she is suppose to get one, previous years she refused   Additional Screenings:   Hepatitis C  Screening: needs to be added to next labs      Plan:      1. Encounter for Medicare annual wellness exam  I have personally reviewed and noted the following in the patient's chart:   . Medical and social history . Use of alcohol, tobacco or illicit drugs  . Current medications and supplements . Functional ability and status . Nutritional status . Physical activity . Advanced directives . List of other physicians . Hospitalizations, surgeries, and ER visits in previous 12 months . Vitals  . Screenings to include cognitive, depression, and falls . Referrals and appointments  In addition, I have reviewed and discussed with patient certain preventive protocols, quality metrics, and best practice recommendations. A written personalized care plan for preventive services as well as general preventive health recommendations were provided to patient.      I provided 20 minutes of non-face-to-face time during this encounter.   Perlie Mayo, NP  09/20/2018

## 2018-09-20 NOTE — Patient Instructions (Addendum)
Theresa Garcia , Thank you for taking time to come for your Medicare Wellness Visit. I appreciate your ongoing commitment to your health goals. Please review the following plan we discussed and let me know if I can assist you in the future.   Please continue to practice social distancing to keep you, your family, and our community safe.  If you must go out, please wear a Mask and practice good handwashing.  Screening recommendations/referrals: Colonoscopy: Suppose to get one this year.  Mammogram: Completed this years Bone Density: Completed Recommended yearly ophthalmology/optometry visit for glaucoma screening and checkup Recommended yearly dental visit for hygiene and checkup  Vaccinations: Influenza vaccine: Due Fall 2020 Pneumococcal vaccine: Completed Tdap vaccine: Unsure when last one was. Shingles vaccine:  Unsure when you got this.  Advanced directives: You reported you have a living will. Bring a copy for [place] to scan if you do not mind  Conditions/risks identified: FALLS  Next appointment: 09/21/2018 1:20 pm CPE    Preventive Care 65 Years and Older, Female Preventive care refers to lifestyle choices and visits with your health care provider that can promote health and wellness. What does preventive care include?  A yearly physical exam. This is also called an annual well check.  Dental exams once or twice a year.  Routine eye exams. Ask your health care provider how often you should have your eyes checked.  Personal lifestyle choices, including:  Daily care of your teeth and gums.  Regular physical activity.  Eating a healthy diet.  Avoiding tobacco and drug use.  Limiting alcohol use.  Practicing safe sex.  Taking low-dose aspirin every day.  Taking vitamin and mineral supplements as recommended by your health care provider. What happens during an annual well check? The services and screenings done by your health care provider during your annual well check  will depend on your age, overall health, lifestyle risk factors, and family history of disease. Counseling  Your health care provider may ask you questions about your:  Alcohol use.  Tobacco use.  Drug use.  Emotional well-being.  Home and relationship well-being.  Sexual activity.  Eating habits.  History of falls.  Memory and ability to understand (cognition).  Work and work Statistician.  Reproductive health. Screening  You may have the following tests or measurements:  Height, weight, and BMI.  Blood pressure.  Lipid and cholesterol levels. These may be checked every 5 years, or more frequently if you are over 56 years old.  Skin check.  Lung cancer screening. You may have this screening every year starting at age 66 if you have a 30-pack-year history of smoking and currently smoke or have quit within the past 15 years.  Fecal occult blood test (FOBT) of the stool. You may have this test every year starting at age 8.  Flexible sigmoidoscopy or colonoscopy. You may have a sigmoidoscopy every 5 years or a colonoscopy every 10 years starting at age 29.  Hepatitis C blood test.  Hepatitis B blood test.  Sexually transmitted disease (STD) testing.  Diabetes screening. This is done by checking your blood sugar (glucose) after you have not eaten for a while (fasting). You may have this done every 1-3 years.  Bone density scan. This is done to screen for osteoporosis. You may have this done starting at age 44.  Mammogram. This may be done every 1-2 years. Talk to your health care provider about how often you should have regular mammograms. Talk with your health care provider  about your test results, treatment options, and if necessary, the need for more tests. Vaccines  Your health care provider may recommend certain vaccines, such as:  Influenza vaccine. This is recommended every year.  Tetanus, diphtheria, and acellular pertussis (Tdap, Td) vaccine. You may  need a Td booster every 10 years.  Zoster vaccine. You may need this after age 1.  Pneumococcal 13-valent conjugate (PCV13) vaccine. One dose is recommended after age 14.  Pneumococcal polysaccharide (PPSV23) vaccine. One dose is recommended after age 13. Talk to your health care provider about which screenings and vaccines you need and how often you need them. This information is not intended to replace advice given to you by your health care provider. Make sure you discuss any questions you have with your health care provider. Document Released: 03/08/2015 Document Revised: 10/30/2015 Document Reviewed: 12/11/2014 Elsevier Interactive Patient Education  2017 [employer] Prevention in the Home Falls can cause injuries. They can happen to people of all ages. There are many things you can do to make your home safe and to help prevent falls. What can I do on the outside of my home?  Regularly fix the edges of walkways and driveways and fix any cracks.  Remove anything that might make you trip as you walk through a door, such as a raised step or threshold.  Trim any bushes or trees on the path to your home.  Use bright outdoor lighting.  Clear any walking paths of anything that might make someone trip, such as rocks or tools.  Regularly check to see if handrails are loose or broken. Make sure that both sides of any steps have handrails.  Any raised decks and porches should have guardrails on the edges.  Have any leaves, snow, or ice cleared regularly.  Use sand or salt on walking paths during winter.  Clean up any spills in your garage right away. This includes oil or grease spills. What can I do in the bathroom?  Use night lights.  Install grab bars by the toilet and in the tub and shower. Do not use towel bars as grab bars.  Use non-skid mats or decals in the tub or shower.  If you need to sit down in the shower, use a plastic, non-slip stool.  Keep the floor  dry. Clean up any water that spills on the floor as soon as it happens.  Remove soap buildup in the tub or shower regularly.  Attach bath mats securely with double-sided non-slip rug tape.  Do not have throw rugs and other things on the floor that can make you trip. What can I do in the bedroom?  Use night lights.  Make sure that you have a light by your bed that is easy to reach.  Do not use any sheets or blankets that are too big for your bed. They should not hang down onto the floor.  Have a firm chair that has side arms. You can use this for support while you get dressed.  Do not have throw rugs and other things on the floor that can make you trip. What can I do in the kitchen?  Clean up any spills right away.  Avoid walking on wet floors.  Keep items that you use a lot in easy-to-reach places.  If you need to reach something above you, use a strong step stool that has a grab bar.  Keep electrical cords out of the way.  Do not use floor polish or  wax that makes floors slippery. If you must use wax, use non-skid floor wax.  Do not have throw rugs and other things on the floor that can make you trip. What can I do with my stairs?  Do not leave any items on the stairs.  Make sure that there are handrails on both sides of the stairs and use them. Fix handrails that are broken or loose. Make sure that handrails are as long as the stairways.  Check any carpeting to make sure that it is firmly attached to the stairs. Fix any carpet that is loose or worn.  Avoid having throw rugs at the top or bottom of the stairs. If you do have throw rugs, attach them to the floor with carpet tape.  Make sure that you have a light switch at the top of the stairs and the bottom of the stairs. If you do not have them, ask someone to add them for you. What else can I do to help prevent falls?  Wear shoes that:  Do not have high heels.  Have rubber bottoms.  Are comfortable and fit you  well.  Are closed at the toe. Do not wear sandals.  If you use a stepladder:  Make sure that it is fully opened. Do not climb a closed stepladder.  Make sure that both sides of the stepladder are locked into place.  Ask someone to hold it for you, if possible.  Clearly mark and make sure that you can see:  Any grab bars or handrails.  First and last steps.  Where the edge of each step is.  Use tools that help you move around (mobility aids) if they are needed. These include:  Canes.  Walkers.  Scooters.  Crutches.  Turn on the lights when you go into a dark area. Replace any light bulbs as soon as they burn out.  Set up your furniture so you have a clear path. Avoid moving your furniture around.  If any of your floors are uneven, fix them.  If there are any pets around you, be aware of where they are.  Review your medicines with your doctor. Some medicines can make you feel dizzy. This can increase your chance of falling. Ask your doctor what other things that you can do to help prevent falls. This information is not intended to replace advice given to you by your health care provider. Make sure you discuss any questions you have with your health care provider. Document Released: 12/06/2008 Document Revised: 07/18/2015 Document Reviewed: 03/16/2014 Elsevier Interactive Patient Education  2017 [employer].

## 2018-09-21 ENCOUNTER — Encounter: Payer: Self-pay | Admitting: Family Medicine

## 2018-09-21 ENCOUNTER — Other Ambulatory Visit: Payer: Self-pay

## 2018-09-21 ENCOUNTER — Ambulatory Visit (INDEPENDENT_AMBULATORY_CARE_PROVIDER_SITE_OTHER): Payer: Medicare HMO | Admitting: Family Medicine

## 2018-09-21 VITALS — BP 100/72 | HR 92 | Temp 98.1°F | Resp 16 | Ht 66.0 in | Wt 192.0 lb

## 2018-09-21 DIAGNOSIS — Z1211 Encounter for screening for malignant neoplasm of colon: Secondary | ICD-10-CM

## 2018-09-21 DIAGNOSIS — Z0000 Encounter for general adult medical examination without abnormal findings: Secondary | ICD-10-CM | POA: Diagnosis not present

## 2018-09-21 MED ORDER — TEMAZEPAM 30 MG PO CAPS: 30.0000 mg | ORAL_CAPSULE | Freq: Every evening | ORAL | 5 refills | Status: DC | PRN

## 2018-09-21 NOTE — Progress Notes (Signed)
    Theresa Garcia     MRN: [id number]      DOB: [birth year]  HPI: Patient is in for annual physical exam. C/o low back pain and stiffness  Immunization is reviewed , and  Is up to date   PE: BP 100/72   Pulse 92   Temp 98.1 F (36.7 C) (Temporal)   Resp 16   Ht 5\' 6"  (1.676 m)   Wt 192 lb (87.1 kg)   SpO2 96%   BMI 30.99 kg/m   Pleasant  female, alert and oriented x 3, in no cardio-pulmonary distress. Afebrile. HEENT No facial trauma or asymetry. Sinuses non tender.  Extra occullar muscles intact, External ears normal, . Neck: supple, no adenopathy,JVD or thyromegaly.No bruits.  Chest: Clear to ascultation bilaterally.No crackles or wheezes. Non tender to palpation  Breast: No physical exam done, has had mammogram in past 5 weeks Cardiovascular system; Heart sounds normal,  S1 and  S2 ,no S3.  No murmur, or thrill. Apical beat not displaced Peripheral pulses normal.  Abdomen: Soft, non tender, no organomegaly or masses. No bruits. Bowel sounds normal. No guarding, tenderness or rebound.  Musculoskeletal exam: Decreased  Though adequate ROM of spine,normal in hips , shoulders and knees. No deformity ,swelling or crepitus noted. No muscle wasting or atrophy.   Neurologic: Cranial nerves 2 to 12 intact. Power, tone ,sensation and reflexes normal throughout. No disturbance in gait. No tremor.  Skin: Intact, no ulceration, erythema , scaling or rash noted. Pigmentation normal throughout  Psych; Normal mood and affect. Judgement and concentration normal   Assessment & Plan:  Annual physical exam Annual exam as documented. Counseling done  re healthy lifestyle involving commitment to 150 minutes exercise per week, heart healthy diet, and attaining healthy weight.The importance of adequate sleep also discussed. Regular seat belt use and home safety, is also discussed. Changes in health habits are decided on by the patient with goals and time frames  set  for achieving them. Immunization and cancer screening needs are specifically addressed at this visit.

## 2018-09-21 NOTE — Patient Instructions (Addendum)
F/U with mD in Feb 2021, call if you need me sooner  Please get fasting LABS AND DO COLO GUARD TEST AS [PROMISED  CALL AND COME FOR FLU VACCINE IN THE FALL  PLEASE START MOVING MORE    Thanks for choosing [facility], we consider it a privelige to serve you.    Back Exercises These exercises help to make your trunk and back strong. They also help to keep the lower back flexible. Doing these exercises can help to prevent back pain or lessen existing pain.  If you have back pain, try to do these exercises 2-3 times each day or as told by your doctor.  As you get better, do the exercises once each day. Repeat the exercises more often as told by your doctor.  To stop back pain from coming back, do the exercises once each day, or as told by your doctor. Exercises Single knee to chest Do these steps 3-5 times in a row for each leg: 1. Lie on your back on a firm bed or the floor with your legs stretched out. 2. Bring one knee to your chest. 3. Grab your knee or thigh with both hands and hold them it in place. 4. Pull on your knee until you feel a gentle stretch in your lower back or buttocks. 5. Keep doing the stretch for 10-30 seconds. 6. Slowly let go of your leg and straighten it. Pelvic tilt Do these steps 5-10 times in a row: 1. Lie on your back on a firm bed or the floor with your legs stretched out. 2. Bend your knees so they point up to the ceiling. Your feet should be flat on the floor. 3. Tighten your lower belly (abdomen) muscles to press your lower back against the floor. This will make your tailbone point up to the ceiling instead of pointing down to your feet or the floor. 4. Stay in this position for 5-10 seconds while you gently tighten your muscles and breathe evenly. Cat-cow Do these steps until your lower back bends more easily: 1. Get on your hands and knees on a firm surface. Keep your hands under your shoulders, and keep your knees under your hips. You  may put padding under your knees. 2. Let your head hang down toward your chest. Tighten (contract) the muscles in your belly. Point your tailbone toward the floor so your lower back becomes rounded like the back of a cat. 3. Stay in this position for 5 seconds. 4. Slowly lift your head. Let the muscles of your belly relax. Point your tailbone up toward the ceiling so your back forms a sagging arch like the back of a cow. 5. Stay in this position for 5 seconds.  Press-ups Do these steps 5-10 times in a row: 1. Lie on your belly (face-down) on the floor. 2. Place your hands near your head, about shoulder-width apart. 3. While you keep your back relaxed and keep your hips on the floor, slowly straighten your arms to raise the top half of your body and lift your shoulders. Do not use your back muscles. You may change where you place your hands in order to make yourself more comfortable. 4. Stay in this position for 5 seconds. 5. Slowly return to lying flat on the floor.  Bridges Do these steps 10 times in a row: 1. Lie on your back on a firm surface. 2. Bend your knees so they point up to the ceiling. Your feet should be flat  on the floor. Your arms should be flat at your sides, next to your body. 3. Tighten your butt muscles and lift your butt off the floor until your waist is almost as high as your knees. If you do not feel the muscles working in your butt and the back of your thighs, slide your feet 1-2 inches farther away from your butt. 4. Stay in this position for 3-5 seconds. 5. Slowly lower your butt to the floor, and let your butt muscles relax. If this exercise is too easy, try doing it with your arms crossed over your chest. Belly crunches Do these steps 5-10 times in a row: 1. Lie on your back on a firm bed or the floor with your legs stretched out. 2. Bend your knees so they point up to the ceiling. Your feet should be flat on the floor. 3. Cross your arms over your chest. 4. Tip  your chin a little bit toward your chest but do not bend your neck. 5. Tighten your belly muscles and slowly raise your chest just enough to lift your shoulder blades a tiny bit off of the floor. Avoid raising your body higher than that, because it can put too much stress on your low back. 6. Slowly lower your chest and your head to the floor. Back lifts Do these steps 5-10 times in a row: 1. Lie on your belly (face-down) with your arms at your sides, and rest your forehead on the floor. 2. Tighten the muscles in your legs and your butt. 3. Slowly lift your chest off of the floor while you keep your hips on the floor. Keep the back of your head in line with the curve in your back. Look at the floor while you do this. 4. Stay in this position for 3-5 seconds. 5. Slowly lower your chest and your face to the floor. Contact a doctor if:  Your back pain gets a lot worse when you do an exercise.  Your back pain does not get better 2 hours after you exercise. If you have any of these problems, stop doing the exercises. Do not do them again unless your doctor says it is okay. Get help right away if:  You have sudden, very bad back pain. If this happens, stop doing the exercises. Do not do them again unless your doctor says it is okay. This information is not intended to replace advice given to you by your health care provider. Make sure you discuss any questions you have with your health care provider. Document Released: 03/14/2010 Document Revised: 11/04/2017 Document Reviewed: 11/04/2017 Elsevier Patient Education  2020 [employer].

## 2018-09-21 NOTE — Assessment & Plan Note (Signed)

## 2018-09-25 ENCOUNTER — Other Ambulatory Visit: Payer: Self-pay | Admitting: Family Medicine

## 2018-10-03 DIAGNOSIS — E559 Vitamin D deficiency, unspecified: Secondary | ICD-10-CM | POA: Diagnosis not present

## 2018-10-03 DIAGNOSIS — I10 Essential (primary) hypertension: Secondary | ICD-10-CM | POA: Diagnosis not present

## 2018-10-03 DIAGNOSIS — E7849 Other hyperlipidemia: Secondary | ICD-10-CM | POA: Diagnosis not present

## 2018-10-05 ENCOUNTER — Other Ambulatory Visit: Payer: Self-pay | Admitting: Family Medicine

## 2018-10-05 LAB — URINE CULTURE
MICRO NUMBER:: 758363
SPECIMEN QUALITY:: ADEQUATE

## 2018-10-05 LAB — LIPID PANEL
Cholesterol: 144 mg/dL (ref <200)
HDL: 56 mg/dL (ref >=50)
LDL Cholesterol (Calc): 70 mg/dL (calc)
Non-HDL Cholesterol (Calc): 88 mg/dL (calc) (ref <130)
Total CHOL/HDL Ratio: 2.6 (calc) (ref <5.0)
Triglycerides: 101 mg/dL (ref <150)

## 2018-10-05 LAB — COMPLETE METABOLIC PANEL WITH GFR
AG Ratio: 1.5 (calc) (ref 1.0–2.5)
ALT: 10 U/L (ref 6–29)
AST: 15 U/L (ref 10–35)
Albumin: 4.1 g/dL (ref 3.6–5.1)
Alkaline phosphatase (APISO): 58 U/L (ref 37–153)
BUN: 12 mg/dL (ref 7–25)
CO2: 23 mmol/L (ref 20–32)
Calcium: 9.8 mg/dL (ref 8.6–10.4)
Chloride: 111 mmol/L — ABNORMAL HIGH (ref 98–110)
Creat: 0.88 mg/dL (ref 0.60–0.93)
GFR, Est African American: 74 mL/min/{1.73_m2} (ref >=60)
GFR, Est Non African American: 64 mL/min/{1.73_m2} (ref >=60)
Globulin: 2.7 g/dL (calc) (ref 1.9–3.7)
Glucose, Bld: 91 mg/dL (ref 65–99)
Potassium: 4 mmol/L (ref 3.5–5.3)
Sodium: 143 mmol/L (ref 135–146)
Total Bilirubin: 0.7 mg/dL (ref 0.2–1.2)
Total Protein: 6.8 g/dL (ref 6.1–8.1)

## 2018-10-05 LAB — URINALYSIS W MICROSCOPIC + REFLEX CULTURE
Bilirubin Urine: NEGATIVE
Glucose, UA: NEGATIVE
Hyaline Cast: NONE SEEN /LPF
Ketones, ur: NEGATIVE
Nitrites, Initial: NEGATIVE
Specific Gravity, Urine: 1.012 (ref 1.001–1.03)
pH: 5.5 (ref 5.0–8.0)

## 2018-10-05 LAB — CULTURE INDICATED

## 2018-10-05 LAB — CBC
HCT: 40.6 % (ref 35.0–45.0)
Hemoglobin: 13.6 g/dL (ref 11.7–15.5)
MCH: 32.6 pg (ref 27.0–33.0)
MCHC: 33.5 g/dL (ref 32.0–36.0)
MCV: 97.4 fL (ref 80.0–100.0)
MPV: 10.3 fL (ref 7.5–12.5)
Platelets: 273 10*3/uL (ref 140–400)
RBC: 4.17 10*6/uL (ref 3.80–5.10)
RDW: 12.5 % (ref 11.0–15.0)
WBC: 9.5 10*3/uL (ref 3.8–10.8)

## 2018-10-05 LAB — TSH: TSH: 2.07 mIU/L (ref 0.40–4.50)

## 2018-10-05 LAB — VITAMIN D 25 HYDROXY (VIT D DEFICIENCY, FRACTURES): Vit D, 25-Hydroxy: 15 ng/mL — ABNORMAL LOW (ref 30–100)

## 2018-10-05 MED ORDER — CIPROFLOXACIN HCL 500 MG PO TABS: 500.0000 mg | ORAL_TABLET | Freq: Two times a day (BID) | ORAL | 0 refills | Status: AC

## 2018-11-03 ENCOUNTER — Other Ambulatory Visit: Payer: Self-pay | Admitting: Family Medicine

## 2018-11-07 ENCOUNTER — Ambulatory Visit (INDEPENDENT_AMBULATORY_CARE_PROVIDER_SITE_OTHER): Payer: Medicare HMO

## 2018-11-07 DIAGNOSIS — Z23 Encounter for immunization: Secondary | ICD-10-CM | POA: Diagnosis not present

## 2018-11-16 DIAGNOSIS — Z1211 Encounter for screening for malignant neoplasm of colon: Secondary | ICD-10-CM | POA: Diagnosis not present

## 2018-11-16 LAB — COLOGUARD: Cologuard: NEGATIVE

## 2018-12-14 ENCOUNTER — Telehealth: Payer: Self-pay | Admitting: Family Medicine

## 2018-12-14 NOTE — Telephone Encounter (Signed)
cologuard is negative. Patient advised.

## 2018-12-14 NOTE — Telephone Encounter (Signed)
Please call the pt regarding colorguard results

## 2018-12-20 ENCOUNTER — Ambulatory Visit (INDEPENDENT_AMBULATORY_CARE_PROVIDER_SITE_OTHER): Payer: Medicare HMO | Admitting: Family Medicine

## 2018-12-20 ENCOUNTER — Other Ambulatory Visit: Payer: Self-pay

## 2018-12-20 ENCOUNTER — Encounter: Payer: Self-pay | Admitting: Family Medicine

## 2018-12-20 DIAGNOSIS — N3001 Acute cystitis with hematuria: Secondary | ICD-10-CM | POA: Diagnosis not present

## 2018-12-20 DIAGNOSIS — M549 Dorsalgia, unspecified: Secondary | ICD-10-CM | POA: Diagnosis not present

## 2018-12-20 LAB — POCT URINALYSIS DIP (CLINITEK)
Bilirubin, UA: NEGATIVE
Glucose, UA: NEGATIVE mg/dL
Ketones, POC UA: NEGATIVE mg/dL
Nitrite, UA: POSITIVE — AB
POC PROTEIN,UA: NEGATIVE
Spec Grav, UA: 1.015 (ref 1.010–1.025)
Urobilinogen, UA: 0.2 E.U./dL
pH, UA: 5.5 (ref 5.0–8.0)

## 2018-12-20 MED ORDER — SULFAMETHOXAZOLE-TRIMETHOPRIM 800-160 MG PO TABS: 1.0000 | ORAL_TABLET | Freq: Two times a day (BID) | ORAL | 0 refills | Status: AC

## 2018-12-20 NOTE — Progress Notes (Signed)
Virtual Visit via Telephone Note   This visit type was conducted due to national recommendations for restrictions regarding the COVID-19 Pandemic (e.g. social distancing) in an effort to limit this patient's exposure and mitigate transmission in our community.  Due to her co-morbid illnesses, this patient is at least at moderate risk for complications without adequate follow up.  This format is felt to be most appropriate for this patient at this time.  The patient did not have access to video technology/had technical difficulties with video requiring transitioning to audio format only (telephone).  All issues noted in this document were discussed and addressed.  No physical exam could be performed with this format.   Evaluation Performed:  Follow-up visit  Date:  12/20/2018   ID:  Theresa, Garcia 1940/04/24, MRN [id number]  Patient Location: Home Provider Location: Office  Location of Patient: Home Location of Provider: Telehealth Consent was obtain for visit to be over via telehealth. I verified that I am speaking with the correct person using two identifiers.  PCP:  Fayrene Helper, MD   Chief Complaint: Urinary tract infection  History of Present Illness:    Theresa Garcia is a 78 y.o. female with history of chronic back pain, GERD, depression, kidney stones, hypertension among others.  Presents today after several days of onset of increased urgency and frequency of urination. No burning, but low back pain all the way across. Can't stand for long. Reports dark urine. Denies drinking enough water. Denies N/V. Denies bowel changes.  The patient does not have symptoms concerning for COVID-19 infection (fever, chills, cough, or new shortness of breath).  Past Medical, Surgical, Social History, Allergies, and Medications have been Reviewed.  Past Medical History:  Diagnosis Date  . Bronchitis   . Cancer ([place])   . Chronic back pain   . Depression   . GERD  (gastroesophageal reflux disease)   . Hyperlipidemia   . Hypertension   . Insomnia   . Kidney stone   . Personal history of chemotherapy   . Personal history of radiation therapy   . Pneumonia   . Vertigo    Past Surgical History:  Procedure Laterality Date  . ABDOMINAL HYSTERECTOMY    . BREAST LUMPECTOMY Left 2002  . BREAST SURGERY     left  . CYSTOSCOPY/RETROGRADE/URETEROSCOPY/STONE EXTRACTION WITH BASKET  01/07/2011   Procedure: CYSTOSCOPY/RETROGRADE/URETEROSCOPY/STONE EXTRACTION WITH BASKET;  Surgeon: Marissa Nestle;  Location: [facility];  Service: Urology;  Laterality: Left;  Balloon Dilatation; stone given to family per MD  . TUBAL LIGATION       No outpatient medications have been marked as taking for the 12/20/18 encounter (Office Visit) with Perlie Mayo, NP.     Allergies:   Meclizine and Penicillins   Social History   Tobacco Use  . Smoking status: Former Smoker    Packs/day: 1.00    Years: 5.00    Pack years: 5.00    Types: Cigarettes    Quit date: 09/07/1980    Years since quitting: 38.3  . Smokeless tobacco: Never Used  . Tobacco comment: quit 25+ years ago  Substance Use Topics  . Alcohol use: No    Alcohol/week: 0.0 standard drinks  . Drug use: No     Family Hx: The patient's family history includes Aneurysm in her brother; Bone cancer in her brother; COPD in her father, sister, and sister; Diabetes in her brother and sister; HIV/AIDS in her brother; Heart disease in her  brother; Seizures in her mother.  ROS:   Please see the history of present illness.    All other systems reviewed and are negative.   Labs/Other Tests and Data Reviewed:    Recent Labs: 10/03/2018: ALT 10; BUN 12; Creat 0.88; Hemoglobin 13.6; Platelets 273; Potassium 4.0; Sodium 143; TSH 2.07   Recent Lipid Panel Lab Results  Component Value Date/Time   CHOL 144 10/03/2018 08:17 AM   TRIG 101 10/03/2018 08:17 AM   HDL 56 10/03/2018 08:17 AM   CHOLHDL 2.6 10/03/2018 08:17  AM   LDLCALC 70 10/03/2018 08:17 AM    Wt Readings from Last 3 Encounters:  09/21/18 192 lb (87.1 kg)  09/20/18 195 lb (88.5 kg)  05/03/18 195 lb (88.5 kg)     Objective:    Vital Signs:  There were no vitals taken for this visit.   GEN:  Alert and oriented RESPIRATORY:  No shortness of breath noted in conversation PSYCH:  Normal affect and mood  ASSESSMENT & PLAN:    1. Acute back pain, unspecified back location, unspecified back pain laterality Signs and symptoms are consistent with urinary tract infection will be covering. Dip + Sending out culture to make sure that the correct antibiotic was chosen.  Patient aware that she might need to be on a different medication pending what the coverage of the culture sent.   Reviewed side effects, risks and benefits of medication.    Patient acknowledged agreement and understanding of the plan.   - POCT URINALYSIS DIP (CLINITEK) - Urine Culture -Bactrim  2.  Acute cystitis with hematuria Signs and symptoms consistent with urinary tract infection.  Begin sent out for culture.  Cover with Bactrim.  -Bactrim  - Urine Culture    Time:   Today, I have spent 10 minutes with the patient with telehealth technology discussing the above problems.     Medication Adjustments/Labs and Tests Ordered: Current medicines are reviewed at length with the patient today.  Concerns regarding medicines are outlined above.   Tests Ordered: Orders Placed This Encounter  Procedures  . Urine Culture  . POCT URINALYSIS DIP (CLINITEK)    Medication Changes: Meds ordered this encounter  Medications  . sulfamethoxazole-trimethoprim (BACTRIM DS) 800-160 MG tablet    Sig: Take 1 tablet by mouth 2 (two) times daily for 3 days.    Dispense:  6 tablet    Refill:  0    Order Specific Question:   Supervising Provider    Answer:   Fayrene Helper [id number]    Disposition:  Follow up as scheduled   Signed, Perlie Mayo, NP  12/20/2018 2:46  PM     [facility]

## 2018-12-20 NOTE — Progress Notes (Signed)
+   dip in office, will send in medication, phone visit

## 2018-12-20 NOTE — Patient Instructions (Signed)
  I appreciate the opportunity to provide you with the care for your health and wellness. Today we discussed: UTI  Follow up: 04/04/2019  No labs or referrals today  I have sent in a medication for your UTI infection. I also sent out your urine to have it checked to make sure we cover you with the best medication, We might have to change the medication depending on what this says.  Please continue to practice social distancing to keep you, your family, and our community safe.  If you must go out, please wear a mask and practice good handwashing.  It was a pleasure to see you and I look forward to continuing to work together on your health and well-being. Please do not hesitate to call the office if you need care or have questions about your care.  Have a wonderful day and week. With Gratitude, Cherly Beach, DNP, AGNP-BC

## 2018-12-21 ENCOUNTER — Other Ambulatory Visit (HOSPITAL_COMMUNITY)
Admission: RE | Admit: 2018-12-21 | Discharge: 2018-12-21 | Disposition: A | Discharge status: Home / Self Care | Payer: Medicare HMO | Source: Ambulatory Visit | Attending: Family Medicine | Admitting: Family Medicine

## 2018-12-21 ENCOUNTER — Ambulatory Visit: Payer: Medicare HMO | Admitting: Family Medicine

## 2018-12-21 DIAGNOSIS — M549 Dorsalgia, unspecified: Secondary | ICD-10-CM | POA: Insufficient documentation

## 2018-12-21 DIAGNOSIS — R319 Hematuria, unspecified: Secondary | ICD-10-CM | POA: Insufficient documentation

## 2018-12-23 LAB — URINE CULTURE: Culture: 80000 — AB

## 2018-12-23 NOTE — Progress Notes (Signed)
Medication was prescribed.  Follow-up to make sure that she is still doing okay and not having any other signs or symptoms. Educated on good hygiene making sure she wipes from front to back.

## 2019-01-21 ENCOUNTER — Other Ambulatory Visit: Payer: Self-pay | Admitting: Family Medicine

## 2019-01-28 ENCOUNTER — Other Ambulatory Visit: Payer: Self-pay | Admitting: Family Medicine

## 2019-02-07 ENCOUNTER — Telehealth: Payer: Self-pay | Admitting: *Deleted

## 2019-02-07 ENCOUNTER — Other Ambulatory Visit: Payer: Self-pay

## 2019-02-07 DIAGNOSIS — N3001 Acute cystitis with hematuria: Secondary | ICD-10-CM

## 2019-02-07 NOTE — Telephone Encounter (Signed)
Pt said Dr Moshe Cipro said she would call in antibiotics if her uti was no better. She is not better and was wondering if Dr Moshe Cipro would call them into [facility] in [place]. She would like a call back once they've been sent in

## 2019-02-07 NOTE — Telephone Encounter (Signed)
Needs repeat urine culture, treated in 10/28, will treat based on result. Pls order she can submit urine to lab today please

## 2019-02-07 NOTE — Telephone Encounter (Signed)
Advised patient she will need to come in and leave a urine sample with verbal understanding.

## 2019-02-07 NOTE — Telephone Encounter (Signed)
Called patient to advise her of treatment plan. No answer. Left generic vm asking for call back

## 2019-03-21 ENCOUNTER — Ambulatory Visit (INDEPENDENT_AMBULATORY_CARE_PROVIDER_SITE_OTHER): Payer: Medicare HMO | Admitting: Family Medicine

## 2019-03-21 ENCOUNTER — Encounter: Payer: Self-pay | Admitting: Family Medicine

## 2019-03-21 ENCOUNTER — Other Ambulatory Visit: Payer: Self-pay

## 2019-03-21 VITALS — BP 136/78 | HR 92 | Temp 97.5°F | Resp 16 | Ht 66.0 in | Wt 198.2 lb

## 2019-03-21 DIAGNOSIS — M549 Dorsalgia, unspecified: Secondary | ICD-10-CM

## 2019-03-21 DIAGNOSIS — F418 Other specified anxiety disorders: Secondary | ICD-10-CM | POA: Diagnosis not present

## 2019-03-21 DIAGNOSIS — I10 Essential (primary) hypertension: Secondary | ICD-10-CM

## 2019-03-21 MED ORDER — KETOROLAC TROMETHAMINE 60 MG/2ML IM SOLN
60.0000 mg | Freq: Once | INTRAMUSCULAR | Status: AC
  Administered 2019-03-21: 60 mg via INTRAMUSCULAR

## 2019-03-21 MED ORDER — PREDNISONE 5 MG PO TABS: 5.0000 mg | ORAL_TABLET | Freq: Two times a day (BID) | ORAL | 0 refills | Status: AC

## 2019-03-21 MED ORDER — METHYLPREDNISOLONE ACETATE 80 MG/ML IJ SUSP
80.0000 mg | Freq: Once | INTRAMUSCULAR | Status: AC
  Administered 2019-03-21: 80 mg via INTRAMUSCULAR

## 2019-03-21 NOTE — Progress Notes (Signed)
   Theresa Garcia     MRN: [id number]      DOB: 1940-06-10   HPI [name] is here with a 3 week h/po LBP  radiaiting to buttocks, noo known inciting trauma, no new lower extremity weakness or numbess, no incontinece  ROS Denies recent fever or chills. Denies sinus pressure, nasal congestion, ear pain or sore throat. Denies chest congestion, productive cough or wheezing. Denies chest pains, palpitations and leg swelling Denies abdominal pain, nausea, vomiting,diarrhea or constipation.   Denies dysuria, frequency, hesitancy or incontinence.  Denies headaches, seizures, numbness, or tingling. Denies uncontrolled  depression, anxiety or insomnia. Denies skin break down or rash.   PE  BP 136/78   Pulse 92   Temp (!) 97.5 F (36.4 C) (Temporal)   Resp 16   Ht 5\' 6"  (1.676 m)   Wt 198 lb 3.4 oz (89.9 kg)   SpO2 97%   BMI 31.99 kg/m   Patient alert and oriented and in no cardiopulmonary distress.  HEENT: No facial asymmetry, EOMI,     Neck supple .  Chest: Clear to auscultation bilaterally.  CVS: S1, S2 no murmurs, no S3.Regular rate.  ABD: Soft non tender.   Ext: No edema  [id number]  ROM lumbar spine, adequate in shoulders, hips and knees.  Skin: Intact, no ulcerations or rash noted.  Psych: Good eye contact, normal affect. Memory intact not anxious or depressed appearing.  CNS: CN 2-12 intact, power,  normal throughout.no focal deficits noted.   Assessment & Plan  Back pain with radiation Uncontrolled.Toradol and depo medrol administered IM in the office , to be followed by a short course of oral prednisone and NSAIDS.   Depression with anxiety Controlled, no change in medication   Essential hypertension Controlled, no change in medication

## 2019-03-21 NOTE — Assessment & Plan Note (Signed)
Uncontrolled.Toradol and depo medrol administered IM in the office , to be followed by a short course of oral prednisone and NSAIDS.  

## 2019-03-21 NOTE — Patient Instructions (Addendum)
F/U as before. Call if you need me sooner  You have back pain with sciatica Toraddol 60 mg iM and depo Medrol 80 mg IM are administered in the office today , and a short course of prednisone are also prescribed, this should get  You back to tap dancing!  Do NOT take eliqis for the next 1 week  Take ES tylenol one twice daily for next 1 week, then , as needed  Thanks for choosing [facility], we consider it a privelige to serve you.

## 2019-03-23 ENCOUNTER — Other Ambulatory Visit: Payer: Self-pay | Admitting: Family Medicine

## 2019-03-26 ENCOUNTER — Encounter: Payer: Self-pay | Admitting: Family Medicine

## 2019-03-26 NOTE — Assessment & Plan Note (Signed)
Controlled, no change in medication  

## 2019-03-27 ENCOUNTER — Other Ambulatory Visit: Payer: Self-pay | Admitting: Family Medicine

## 2019-03-30 ENCOUNTER — Ambulatory Visit (INDEPENDENT_AMBULATORY_CARE_PROVIDER_SITE_OTHER): Payer: Medicare HMO | Admitting: Family Medicine

## 2019-03-30 ENCOUNTER — Other Ambulatory Visit: Payer: Self-pay

## 2019-03-30 VITALS — BP 136/78 | Ht 66.0 in | Wt 198.0 lb

## 2019-03-30 DIAGNOSIS — F418 Other specified anxiety disorders: Secondary | ICD-10-CM

## 2019-03-30 DIAGNOSIS — A6004 Herpesviral vulvovaginitis: Secondary | ICD-10-CM | POA: Diagnosis not present

## 2019-03-30 DIAGNOSIS — E7849 Other hyperlipidemia: Secondary | ICD-10-CM

## 2019-03-30 DIAGNOSIS — I10 Essential (primary) hypertension: Secondary | ICD-10-CM

## 2019-03-30 DIAGNOSIS — F99 Mental disorder, not otherwise specified: Secondary | ICD-10-CM

## 2019-03-30 DIAGNOSIS — F5105 Insomnia due to other mental disorder: Secondary | ICD-10-CM

## 2019-03-30 DIAGNOSIS — Z780 Asymptomatic menopausal state: Secondary | ICD-10-CM

## 2019-03-30 DIAGNOSIS — M549 Dorsalgia, unspecified: Secondary | ICD-10-CM

## 2019-03-30 DIAGNOSIS — Z1231 Encounter for screening mammogram for malignant neoplasm of breast: Secondary | ICD-10-CM

## 2019-03-30 MED ORDER — APIXABAN 5 MG PO TABS: 5.0000 mg | ORAL_TABLET | Freq: Two times a day (BID) | ORAL | 5 refills | Status: DC

## 2019-04-03 ENCOUNTER — Encounter: Payer: Self-pay | Admitting: Family Medicine

## 2019-04-03 ENCOUNTER — Other Ambulatory Visit: Payer: Self-pay | Admitting: *Deleted

## 2019-04-03 DIAGNOSIS — I10 Essential (primary) hypertension: Secondary | ICD-10-CM

## 2019-04-03 DIAGNOSIS — E7849 Other hyperlipidemia: Secondary | ICD-10-CM

## 2019-04-03 DIAGNOSIS — E559 Vitamin D deficiency, unspecified: Secondary | ICD-10-CM

## 2019-04-03 NOTE — Assessment & Plan Note (Signed)
Updated lab needed at/ before next visit. Hyperlipidemia:Low fat diet discussed and encouraged.   Lipid Panel  Lab Results  Component Value Date   CHOL 144 10/03/2018   HDL 56 10/03/2018   LDLCALC 70 10/03/2018   TRIG 101 10/03/2018   CHOLHDL 2.6 10/03/2018

## 2019-04-03 NOTE — Progress Notes (Signed)
Virtual Visit via Telephone Note  I connected with Theresa Garcia on 04/03/19 at  2:00 PM EST by telephone and verified that I am speaking with the correct person using two identifiers.  Location: Patient: home Provider: office   I discussed the limitations, risks, security and privacy concerns of performing an evaluation and management service by telephone and the availability of in person appointments. I also discussed with the patient that there may be a patient responsible charge related to this service. The patient expressed understanding and agreed to proceed.   History of Present Illness: F/U chronic problems, medication review, and refill medication when necessary. Review most recent labs and order labs which are due Review preventive health and update with necessary referrals or immunizations as indicated Denies recent fever or chills. Denies sinus pressure, nasal congestion, ear pain or sore throat. Denies chest congestion, productive cough or wheezing. Denies chest pains, palpitations and leg swelling Denies abdominal pain, nausea, vomiting,diarrhea or constipation.   Denies dysuria, frequency, hesitancy or incontinence. Back pain is 80 % better than when she was recently seen though persists, limiting ability to stand for extended period Denies headaches, seizures, numbness, or tingling. Denies uncontrolled  depression, anxiety or insomnia. Denies skin break down or rash.       Observations/Objective: BP 136/78   Ht 5\' 6"  (1.676 m)   Wt 198 lb (89.8 kg)   BMI 31.96 kg/m  Good communication with no confusion and intact memory. Alert and oriented x 3 No signs of respiratory distress during speech    Assessment and Plan: Essential hypertension Controlled, no change in medication DASH diet and commitment to daily physical activity for a minimum of 30 minutes discussed and encouraged, as a part of hypertension management. The importance of attaining a healthy  weight is also discussed.  BP/Weight 03/30/2019 03/21/2019 09/21/2018 09/20/2018 05/03/2018 09/13/2017 [id number]  Systolic BP [id number] 123456 123456 123456 123456  Diastolic BP 78 78 72 84 84 64 82  Wt. (Lbs) 198 198.21 192 195 195 190 189.12  BMI 31.96 31.99 30.99 31.47 31.47 30.67 30.52       Hyperlipidemia Updated lab needed at/ before next visit. Hyperlipidemia:Low fat diet discussed and encouraged.   Lipid Panel  Lab Results  Component Value Date   CHOL 144 10/03/2018   HDL 56 10/03/2018   LDLCALC 70 10/03/2018   TRIG 101 10/03/2018   CHOLHDL 2.6 10/03/2018       Insomnia Controlled, no change in medication . Sleeps on average 11 hrs/ day   Type 2 HSV infection of vulvovaginal region Asymptomatic on chronic meds  Back pain with radiation Improved though persists, tylenol , back hygiene and exercises  Depression with anxiety Controlled, no change in medication     Follow Up Instructions:    I discussed the assessment and treatment plan with the patient. The patient was provided an opportunity to ask questions and all were answered. The patient agreed with the plan and demonstrated an understanding of the instructions.   The patient was advised to call back or seek an in-person evaluation if the symptoms worsen or if the condition fails to improve as anticipated.  I provided 20 minutes of non-face-to-face time during this encounter.   Tula Nakayama, MD

## 2019-04-03 NOTE — Patient Instructions (Addendum)
Annual physical exam in office with mD , July 30 or after, call if you need me sooner  Please get fasting lipid, cmp and eGFr and Vit d in the next 1 to 2 weeks  Mammogram and dexa are scheduled for June / July( same day if possible)  Back exercises are mailed to you  Thankful that you feel better.  No changes in medication  Think about what you will eat, plan ahead. Choose " clean, green, fresh or frozen" over canned, processed or packaged foods which are more sugary, salty and fatty. 70 to 75% of food eaten should be vegetables and fruit. Three meals at set times with snacks allowed between meals, but they must be fruit or vegetables. Aim to eat over a 12 hour period , example 7 am to 7 pm, and STOP after  your last meal of the day. Drink water,generally about 64 ounces per day, no other drink is as healthy. Fruit juice is best enjoyed in a healthy way, by EATING the fruit. Thanks for choosing [facility], we consider it a privelige to serve you. ( please note that visit is actually conducted on 04/03/2019.)

## 2019-04-03 NOTE — Assessment & Plan Note (Signed)
Controlled, no change in medication  

## 2019-04-03 NOTE — Assessment & Plan Note (Signed)
Controlled, no change in medication DASH diet and commitment to daily physical activity for a minimum of 30 minutes discussed and encouraged, as a part of hypertension management. The importance of attaining a healthy weight is also discussed.  BP/Weight 03/30/2019 03/21/2019 09/21/2018 09/20/2018 05/03/2018 09/13/2017 [id number]  Systolic BP [id number] 123456 123456 123456 123456  Diastolic BP 78 78 72 84 84 64 82  Wt. (Lbs) 198 198.21 192 195 195 190 189.12  BMI 31.96 31.99 30.99 31.47 31.47 30.67 30.52

## 2019-04-03 NOTE — Assessment & Plan Note (Signed)
Improved though persists, tylenol , back hygiene and exercises

## 2019-04-03 NOTE — Assessment & Plan Note (Signed)
Asymptomatic on chronic meds

## 2019-04-03 NOTE — Assessment & Plan Note (Signed)
Controlled, no change in medication . Sleeps on average 11 hrs/ day

## 2019-04-04 ENCOUNTER — Ambulatory Visit: Payer: Medicare HMO | Admitting: Family Medicine

## 2019-04-21 ENCOUNTER — Other Ambulatory Visit: Payer: Self-pay | Admitting: Family Medicine

## 2019-04-24 ENCOUNTER — Other Ambulatory Visit: Payer: Self-pay | Admitting: Family Medicine

## 2019-04-25 MED FILL — TEMAZEPAM 30 MG CAPSULE: 30 | 30 days supply | Qty: 30 | Fill #0

## 2019-04-26 ENCOUNTER — Other Ambulatory Visit: Payer: Self-pay | Admitting: Family Medicine

## 2019-04-26 LAB — COMPLETE METABOLIC PANEL WITH GFR
AG Ratio: 1.5 (calc) (ref 1.0–2.5)
ALT: 17 U/L (ref 6–29)
AST: 18 U/L (ref 10–35)
Albumin: 4.3 g/dL (ref 3.6–5.1)
Alkaline phosphatase (APISO): 54 U/L (ref 37–153)
BUN: 13 mg/dL (ref 7–25)
CO2: 25 mmol/L (ref 20–32)
Calcium: 9.8 mg/dL (ref 8.6–10.4)
Chloride: 108 mmol/L (ref 98–110)
Creat: 0.79 mg/dL (ref 0.60–0.93)
GFR, Est African American: 84 mL/min/{1.73_m2} (ref >=60)
GFR, Est Non African American: 73 mL/min/{1.73_m2} (ref >=60)
Globulin: 2.9 g/dL (calc) (ref 1.9–3.7)
Glucose, Bld: 87 mg/dL (ref 65–99)
Potassium: 3.9 mmol/L (ref 3.5–5.3)
Sodium: 142 mmol/L (ref 135–146)
Total Bilirubin: 0.5 mg/dL (ref 0.2–1.2)
Total Protein: 7.2 g/dL (ref 6.1–8.1)

## 2019-04-26 LAB — LIPID PANEL
Cholesterol: 159 mg/dL (ref <200)
HDL: 63 mg/dL (ref >=50)
LDL Cholesterol (Calc): 79 mg/dL (calc)
Non-HDL Cholesterol (Calc): 96 mg/dL (calc) (ref <130)
Total CHOL/HDL Ratio: 2.5 (calc) (ref <5.0)
Triglycerides: 87 mg/dL (ref <150)

## 2019-04-26 LAB — VITAMIN D 25 HYDROXY (VIT D DEFICIENCY, FRACTURES): Vit D, 25-Hydroxy: 14 ng/mL — ABNORMAL LOW (ref 30–100)

## 2019-04-26 MED ORDER — ERGOCALCIFEROL 1.25 MG (50000 UT) PO CAPS: 50000.0000 [IU] | ORAL_CAPSULE | ORAL | 2 refills | Status: DC

## 2019-04-26 NOTE — Progress Notes (Signed)
Vi

## 2019-05-05 ENCOUNTER — Other Ambulatory Visit: Payer: Self-pay | Admitting: Family Medicine

## 2019-06-21 ENCOUNTER — Other Ambulatory Visit: Payer: Self-pay | Admitting: Family Medicine

## 2019-08-10 ENCOUNTER — Other Ambulatory Visit: Payer: Self-pay | Admitting: Family Medicine

## 2019-08-23 ENCOUNTER — Other Ambulatory Visit: Payer: Self-pay

## 2019-08-23 ENCOUNTER — Ambulatory Visit (HOSPITAL_COMMUNITY)
Admission: RE | Admit: 2019-08-23 | Discharge: 2019-08-23 | Disposition: A | Discharge status: Home / Self Care | Payer: Medicare HMO | Source: Ambulatory Visit | Attending: Family Medicine | Admitting: Family Medicine

## 2019-08-23 ENCOUNTER — Encounter (HOSPITAL_COMMUNITY): Payer: Self-pay

## 2019-08-23 DIAGNOSIS — Z1231 Encounter for screening mammogram for malignant neoplasm of breast: Secondary | ICD-10-CM | POA: Diagnosis present

## 2019-08-23 DIAGNOSIS — Z780 Asymptomatic menopausal state: Secondary | ICD-10-CM | POA: Insufficient documentation

## 2019-09-21 ENCOUNTER — Telehealth: Payer: Medicare HMO | Admitting: Family Medicine

## 2019-09-21 ENCOUNTER — Other Ambulatory Visit: Payer: Self-pay

## 2019-09-21 ENCOUNTER — Telehealth (INDEPENDENT_AMBULATORY_CARE_PROVIDER_SITE_OTHER): Payer: Medicare HMO | Admitting: Family Medicine

## 2019-09-21 ENCOUNTER — Encounter: Payer: Self-pay | Admitting: Family Medicine

## 2019-09-21 VITALS — BP 136/78 | Ht 66.0 in | Wt 198.0 lb

## 2019-09-21 DIAGNOSIS — Z0000 Encounter for general adult medical examination without abnormal findings: Secondary | ICD-10-CM

## 2019-09-21 NOTE — Progress Notes (Signed)
Subjective:   Theresa Garcia is a 79 y.o. female who presents for Medicare Annual (Subsequent) preventive examination.  Location of Patient: Home Location of Provider: Telephone-in Office Consent was obtain for visit to be over via Telephone   I verified that I am speaking with the correct person using two identifiers.   Review of Systems   yes       Objective:    Today's Vitals   09/21/19 1425  BP: (!) 136/78  Weight: 198 lb (89.8 kg)  Height: _0  (1.676 m)  PainSc: 0-No pain   Body mass index is 31.96 kg/m.  Advanced Directives 09/13/2017 04/05/2017 03/25/2017 03/19/2017 09/07/2016 04/26/2015 04/26/2015  Does Patient Have a Medical Advance Directive? No Yes Yes No No No No  Type of Advance Directive - (No Data) (No Data) - - - -  Does patient want to make changes to medical advance directive? - No - Patient declined No - Patient declined - - - -  Would patient like information on creating a medical advance directive? Yes (ED - Information included in AVS) No - Patient declined No - Patient declined No - Patient declined Yes (MAU/Ambulatory/Procedural Areas - Information given) Yes - Spiritual care consult ordered No - patient declined information  Pre-existing out of facility DNR order (yellow form or pink MOST form) - - - - - - -    Current Medications (verified) Outpatient Encounter Medications as of 09/21/2019  Medication Sig  . acetaminophen (TYLENOL) 500 MG tablet Take 1,000 mg by mouth every 6 (six) hours as needed for mild pain or moderate pain.   Marland Kitchen acyclovir (ZOVIRAX) 400 MG tablet Take 1 tablet by mouth twice daily  . amLODipine-benazepril (LOTREL) 5-40 MG capsule TAKE 1 CAPSULE DAILY  . apixaban (ELIQUIS) 5 MG TABS tablet Take 1 tablet (5 mg total) by mouth 2 (two) times daily.  Marland Kitchen atorvastatin (LIPITOR) 20 MG tablet TAKE 1 TABLET EVERY EVENING  . ergocalciferol (VITAMIN D2) 1.25 MG (50000 UT) capsule Take 1 capsule (50,000 Units total) by mouth once a week. One  capsule once weekly  . FLUoxetine (PROZAC) 10 MG capsule Take 1 capsule by mouth once daily  . Misc. Devices ([place]) MISC 1 each by Does not apply route daily as needed.  Marland Kitchen Respiratory Therapy Supplies (AIRS DISPOSABLE NEBULIZER) KIT 1 Units by Does not apply route daily as needed.  . temazepam (RESTORIL) 30 MG capsule TAKE 1 CAPSULE BY MOUTH AT BEDTIME AS NEEDED FOR SLEEP  . vitamin C (ASCORBIC ACID) 500 MG tablet Take 500 mg by mouth daily.   No facility-administered encounter medications on file as of 09/21/2019.    Allergies (verified) Meclizine and Penicillins   History: Past Medical History:  Diagnosis Date  . Breast cancer ([place]) 02/24/2000  . Bronchitis   . Cancer ([place])   . Chronic back pain   . Depression   . GERD (gastroesophageal reflux disease)   . Hyperlipidemia   . Hypertension   . Insomnia   . Kidney stone   . Personal history of chemotherapy   . Personal history of radiation therapy   . Pneumonia   . Vertigo    Past Surgical History:  Procedure Laterality Date  . ABDOMINAL HYSTERECTOMY    . BREAST LUMPECTOMY Left 2002  . BREAST SURGERY     left  . CYSTOSCOPY/RETROGRADE/URETEROSCOPY/STONE EXTRACTION WITH BASKET  01/07/2011   Procedure: CYSTOSCOPY/RETROGRADE/URETEROSCOPY/STONE EXTRACTION WITH BASKET;  Surgeon: Marissa Nestle;  Location: [facility];  Service: Urology;  Laterality: Left;  Balloon Dilatation; stone given to family per MD  . TUBAL LIGATION     Family History  Problem Relation Age of Onset  . COPD Father   . COPD Sister   . COPD Sister   . Diabetes Sister   . Diabetes Brother   . Seizures Mother   . Bone cancer Brother   . Heart disease Brother   . Aneurysm Brother        brain  . HIV/AIDS Brother    Social History   Socioeconomic History  . Marital status: Divorced    Spouse name: Not on file  . Number of children: 2  . Years of education: Not on file  . Highest education level: Not on file  Occupational History  .  Not on file  Tobacco Use  . Smoking status: Former Smoker    Packs/day: 1.00    Years: 5.00    Pack years: 5.00    Types: Cigarettes    Quit date: 09/07/1980    Years since quitting: 39.0  . Smokeless tobacco: Never Used  . Tobacco comment: quit 25+ years ago  Substance and Sexual Activity  . Alcohol use: No    Alcohol/week: 0.0 standard drinks  . Drug use: No  . Sexual activity: Not Currently  Other Topics Concern  . Not on file  Social History Narrative  . Not on file   Social Determinants of Health   Financial Resource Strain: Low Risk   . Difficulty of Paying Living Expenses: Not hard at all  Food Insecurity: No Food Insecurity  . Worried About Charity fundraiser in the Last Year: Never true  . Ran Out of Food in the Last Year: Never true  Transportation Needs: No Transportation Needs  . Lack of Transportation (Medical): No  . Lack of Transportation (Non-Medical): No  Physical Activity: Insufficiently Active  . Days of Exercise per Week: 1 day  . Minutes of Exercise per Session: 10 min  Stress: No Stress Concern Present  . Feeling of Stress : Not at all  Social Connections: Moderately Isolated  . Frequency of Communication with Friends and Family: More than three times a week  . Frequency of Social Gatherings with Friends and Family: More than three times a week  . Attends Religious Services: More than 4 times per year  . Active Member of Clubs or Organizations: No  . Attends Archivist Meetings: Never  . Marital Status: Divorced    Tobacco Counseling Counseling given: Not Answered Comment: quit 25+ years ago   Clinical Intake:     Pain Score: 0-No pain           Diabetic?no         Activities of Daily Living In your present state of health, do you have any difficulty performing the following activities: 09/21/2019  Hearing? N  Vision? N  Difficulty concentrating or making decisions? N  Walking or climbing stairs? Y  Dressing or  bathing? N  Doing errands, shopping? N  Some recent data might be hidden    Patient Care Team: Fayrene Helper, MD as PCP - General  Indicate any recent Medical Services you may have received from other than [facility] providers in the past year (date may be approximate).     Assessment:   This is a routine wellness examination for Theresa Garcia.  Hearing/Vision screen No exam data present  Dietary issues and exercise activities discussed: Current Exercise Habits: Home  exercise routine, Time (Minutes): 10, Frequency (Times/Week): 1, Weekly Exercise (Minutes/Week): 10, Intensity: Mild, Exercise limited by: None identified  Goals    . Exercise 3x per week (30 min per time)     Recommend starting a routine exercise program at least 3 days a week for 30-45 minutes at a time as tolerated.       Depression Screen PHQ 2/9 Scores 09/21/2019 03/30/2019 03/30/2019 12/20/2018 09/21/2018 09/20/2018 05/03/2018  PHQ - 2 Score 0 0 0 0 0 0 0  PHQ- 9 Score 0 0 - - - - 1    Fall Risk Fall Risk  09/21/2019 03/30/2019 03/21/2019 12/20/2018 09/21/2018  Falls in the past year? 0 0 0 0 0  Number falls in past yr: 0 0 0 0 0  Injury with Fall? 0 0 0 0 0  Risk for fall due to : No Fall Risks - - - -  Follow up Falls evaluation completed - - - -    Any stairs in or around the home? No  If so, are there any without handrails? No  Home free of loose throw rugs in walkways, pet beds, electrical cords, etc? Yes  Adequate lighting in your home to reduce risk of falls? Yes   ASSISTIVE DEVICES UTILIZED TO PREVENT FALLS:  Life alert? No  Use of a cane, walker or w/c? Yes  Grab bars in the bathroom?  Yes Shower chair or bench in shower? Yes  Elevated toilet seat or a handicapped toilet? Yes   TIMED UP AND GO:  Was the test performed? No .  Length of time to ambulate 10 feet: NA sec.     Cognitive Function: MMSE - Mini Mental State Exam 05/27/2016 07/24/2015  Orientation to time 5 5  Orientation to Place 5 5    Registration 3 3  Attention/ Calculation 5 5  Recall 3 2  Language- name 2 objects 2 2  Language- repeat 1 1  Language- follow 3 step command 3 3  Language- read & follow direction 1 1  Write a sentence 1 1  Copy design 1 1  Total score 30 29     6CIT Screen 09/21/2019 09/20/2018 09/13/2017 09/07/2016  What Year? - 0 points 0 points 0 points  What month? 0 points 0 points 0 points 0 points  What time? 0 points 0 points 0 points 0 points  Count back from 20 0 points 0 points 0 points 0 points  Months in reverse 0 points 0 points 0 points 0 points  Repeat phrase 0 points 2 points 0 points 0 points  Total Score - 2 0 0    Immunizations Immunization History  Administered Date(s) Administered  . Fluad Quad(high Dose 65+) 11/07/2018  . Influenza Split 12/18/2010  . Influenza Whole 01/13/2010  . Influenza,inj,Quad PF,6+ Mos 11/28/2012, 11/27/2013, 12/17/2014, 12/30/2015, 11/30/2016  . Influenza-Unspecified 12/24/2017  . Pneumococcal Conjugate-13 03/14/2014  . Pneumococcal Polysaccharide-23 10/10/2012    Tdap: Up to date Flu Vaccine status: Up to date Pneumococcal vaccine status: Up to date Covid-19 vaccine status: Completed vaccines  Qualifies for Shingles Vaccine? Yes   Zostavax completed Yes   Shingrix Completed?: Yes  Screening Tests Health Maintenance  Topic Date Due  . Hepatitis C Screening  Never done  . COVID-19 Vaccine (1) Never done  . TETANUS/TDAP  Never done  . INFLUENZA VACCINE  09/24/2019  . DEXA SCAN  Completed  . PNA vac Low Risk Adult  Completed    Health Maintenance  Health  Maintenance Due  Topic Date Due  . Hepatitis C Screening  Never done  . COVID-19 Vaccine (1) Never done  . TETANUS/TDAP  Never done    Colorectal cancer screening: Completed years ago. Repeat every 10 years Mammogram status: Completed 08-23-19. Repeat every year Bone Density status: Completed 08-23-19. Results reflect: Bone density results: NORMAL. Repeat every 5  years.  Lung Cancer Screening: (Low Dose CT Chest recommended if Age 5-80 years, 30 pack-year currently smoking OR have quit w/in 15years.) does not qualify.   Lung Cancer Screening Referral: No  Additional Screening:  Hepatitis C Screening: does not qualify;   Vision Screening: Recommended annual ophthalmology exams for early detection of glaucoma and other disorders of the eye. Is the patient up to date with their annual eye exam?  Yes  Who is the provider or what is the name of the office in which the patient attends annual eye exams? [facility] If pt is not established with a provider, would they like to be referred to a provider to establish care? No .   Dental Screening: Recommended annual dental exams for proper oral hygiene  Community Resource Referral / Chronic Care Management: CRR required this visit?  No   CCM required this visit?  No      Plan:     1. Encounter for Medicare annual wellness exam   I have personally reviewed and noted the following in the patient's chart:   . Medical and social history . Use of alcohol, tobacco or illicit drugs  . Current medications and supplements . Functional ability and status . Nutritional status . Physical activity . Advanced directives . List of other physicians . Hospitalizations, surgeries, and ER visits in previous 12 months . Vitals . Screenings to include cognitive, depression, and falls . Referrals and appointments  In addition, I have reviewed and discussed with patient certain preventive protocols, quality metrics, and best practice recommendations. A written personalized care plan for preventive services as well as general preventive health recommendations were provided to patient.     Perlie Mayo, NP   09/21/2019   I provided 25 minutes of non-face-to-face time during this encounter.

## 2019-09-21 NOTE — Patient Instructions (Signed)
[name] , Thank you for taking time to come for your Medicare Wellness Visit. I appreciate your ongoing commitment to your health goals. Please review the following plan we discussed and let me know if I can assist you in the future.   Please continue to practice social distancing to keep you, your family, and our community safe.  If you must go out, please wear a Mask and practice good handwashing.  Screening recommendations/referrals: Colonoscopy: up to date  Mammogram: up to date Bone Density: up to date  Recommended yearly ophthalmology/optometry visit for glaucoma screening and checkup Recommended yearly dental visit for hygiene and checkup  Vaccinations: up to date  Advanced directives: Son is power of attorney   Conditions/risks identified: Fall  Next appointment: 09/25/2019 Annual visit with Dr Moshe Cipro   Preventive Care 70 Years and Older, Female Preventive care refers to lifestyle choices and visits with your health care provider that can promote health and wellness. What does preventive care include?  A yearly physical exam. This is also called an annual well check.  Dental exams once or twice a year.  Routine eye exams. Ask your health care provider how often you should have your eyes checked.  Personal lifestyle choices, including:  Daily care of your teeth and gums.  Regular physical activity.  Eating a healthy diet.  Avoiding tobacco and drug use.  Limiting alcohol use.  Practicing safe sex.  Taking low-dose aspirin every day.  Taking vitamin and mineral supplements as recommended by your health care provider. What happens during an annual well check? The services and screenings done by your health care provider during your annual well check will depend on your age, overall health, lifestyle risk factors, and family history of disease. Counseling  Your health care provider may ask you questions about your:  Alcohol use.  Tobacco use.  Drug  use.  Emotional well-being.  Home and relationship well-being.  Sexual activity.  Eating habits.  History of falls.  Memory and ability to understand (cognition).  Work and work Statistician.  Reproductive health. Screening  You may have the following tests or measurements:  Height, weight, and BMI.  Blood pressure.  Lipid and cholesterol levels. These may be checked every 5 years, or more frequently if you are over 18 years old.  Skin check.  Lung cancer screening. You may have this screening every year starting at age 43 if you have a 30-pack-year history of smoking and currently smoke or have quit within the past 15 years.  Fecal occult blood test (FOBT) of the stool. You may have this test every year starting at age 78.  Flexible sigmoidoscopy or colonoscopy. You may have a sigmoidoscopy every 5 years or a colonoscopy every 10 years starting at age 81.  Hepatitis C blood test.  Hepatitis B blood test.  Sexually transmitted disease (STD) testing.  Diabetes screening. This is done by checking your blood sugar (glucose) after you have not eaten for a while (fasting). You may have this done every 1-3 years.  Bone density scan. This is done to screen for osteoporosis. You may have this done starting at age 21.  Mammogram. This may be done every 1-2 years. Talk to your health care provider about how often you should have regular mammograms. Talk with your health care provider about your test results, treatment options, and if necessary, the need for more tests. Vaccines  Your health care provider may recommend certain vaccines, such as:  Influenza vaccine. This is recommended  every year.  Tetanus, diphtheria, and acellular pertussis (Tdap, Td) vaccine. You may need a Td booster every 10 years.  Zoster vaccine. You may need this after age 38.  Pneumococcal 13-valent conjugate (PCV13) vaccine. One dose is recommended after age 50.  Pneumococcal polysaccharide  (PPSV23) vaccine. One dose is recommended after age 36. Talk to your health care provider about which screenings and vaccines you need and how often you need them. This information is not intended to replace advice given to you by your health care provider. Make sure you discuss any questions you have with your health care provider. Document Released: 03/08/2015 Document Revised: 10/30/2015 Document Reviewed: 12/11/2014 Elsevier Interactive Patient Education  2017 [place] Prevention in the Home Falls can cause injuries. They can happen to people of all ages. There are many things you can do to make your home safe and to help prevent falls. What can I do on the outside of my home?  Regularly fix the edges of walkways and driveways and fix any cracks.  Remove anything that might make you trip as you walk through a door, such as a raised step or threshold.  Trim any bushes or trees on the path to your home.  Use bright outdoor lighting.  Clear any walking paths of anything that might make someone trip, such as rocks or tools.  Regularly check to see if handrails are loose or broken. Make sure that both sides of any steps have handrails.  Any raised decks and porches should have guardrails on the edges.  Have any leaves, snow, or ice cleared regularly.  Use sand or salt on walking paths during winter.  Clean up any spills in your garage right away. This includes oil or grease spills. What can I do in the bathroom?  Use night lights.  Install grab bars by the toilet and in the tub and shower. Do not use towel bars as grab bars.  Use non-skid mats or decals in the tub or shower.  If you need to sit down in the shower, use a plastic, non-slip stool.  Keep the floor dry. Clean up any water that spills on the floor as soon as it happens.  Remove soap buildup in the tub or shower regularly.  Attach bath mats securely with double-sided non-slip rug tape.  Do not have  throw rugs and other things on the floor that can make you trip. What can I do in the bedroom?  Use night lights.  Make sure that you have a light by your bed that is easy to reach.  Do not use any sheets or blankets that are too big for your bed. They should not hang down onto the floor.  Have a firm chair that has side arms. You can use this for support while you get dressed.  Do not have throw rugs and other things on the floor that can make you trip. What can I do in the kitchen?  Clean up any spills right away.  Avoid walking on wet floors.  Keep items that you use a lot in easy-to-reach places.  If you need to reach something above you, use a strong step stool that has a grab bar.  Keep electrical cords out of the way.  Do not use floor polish or wax that makes floors slippery. If you must use wax, use non-skid floor wax.  Do not have throw rugs and other things on the floor that can make you trip. What  can I do with my stairs?  Do not leave any items on the stairs.  Make sure that there are handrails on both sides of the stairs and use them. Fix handrails that are broken or loose. Make sure that handrails are as long as the stairways.  Check any carpeting to make sure that it is firmly attached to the stairs. Fix any carpet that is loose or worn.  Avoid having throw rugs at the top or bottom of the stairs. If you do have throw rugs, attach them to the floor with carpet tape.  Make sure that you have a light switch at the top of the stairs and the bottom of the stairs. If you do not have them, ask someone to add them for you. What else can I do to help prevent falls?  Wear shoes that:  Do not have high heels.  Have rubber bottoms.  Are comfortable and fit you well.  Are closed at the toe. Do not wear sandals.  If you use a stepladder:  Make sure that it is fully opened. Do not climb a closed stepladder.  Make sure that both sides of the stepladder are  locked into place.  Ask someone to hold it for you, if possible.  Clearly mark and make sure that you can see:  Any grab bars or handrails.  First and last steps.  Where the edge of each step is.  Use tools that help you move around (mobility aids) if they are needed. These include:  Canes.  Walkers.  Scooters.  Crutches.  Turn on the lights when you go into a dark area. Replace any light bulbs as soon as they burn out.  Set up your furniture so you have a clear path. Avoid moving your furniture around.  If any of your floors are uneven, fix them.  If there are any pets around you, be aware of where they are.  Review your medicines with your doctor. Some medicines can make you feel dizzy. This can increase your chance of falling. Ask your doctor what other things that you can do to help prevent falls. This information is not intended to replace advice given to you by your health care provider. Make sure you discuss any questions you have with your health care provider. Document Released: 12/06/2008 Document Revised: 07/18/2015 Document Reviewed: 03/16/2014 Elsevier Interactive Patient Education  2017 [employer].

## 2019-09-25 ENCOUNTER — Ambulatory Visit (INDEPENDENT_AMBULATORY_CARE_PROVIDER_SITE_OTHER): Payer: Medicare HMO | Admitting: Family Medicine

## 2019-09-25 ENCOUNTER — Encounter: Payer: Self-pay | Admitting: Family Medicine

## 2019-09-25 ENCOUNTER — Other Ambulatory Visit: Payer: Self-pay

## 2019-09-25 VITALS — BP 118/20 | HR 76 | Resp 16 | Ht 67.0 in | Wt 194.0 lb

## 2019-09-25 DIAGNOSIS — G8929 Other chronic pain: Secondary | ICD-10-CM

## 2019-09-25 DIAGNOSIS — I10 Essential (primary) hypertension: Secondary | ICD-10-CM

## 2019-09-25 DIAGNOSIS — H547 Unspecified visual loss: Secondary | ICD-10-CM

## 2019-09-25 DIAGNOSIS — Z0000 Encounter for general adult medical examination without abnormal findings: Secondary | ICD-10-CM

## 2019-09-25 DIAGNOSIS — Z1159 Encounter for screening for other viral diseases: Secondary | ICD-10-CM

## 2019-09-25 DIAGNOSIS — M545 Low back pain, unspecified: Secondary | ICD-10-CM

## 2019-09-25 DIAGNOSIS — E7849 Other hyperlipidemia: Secondary | ICD-10-CM

## 2019-09-25 DIAGNOSIS — M25559 Pain in unspecified hip: Secondary | ICD-10-CM

## 2019-09-25 DIAGNOSIS — E559 Vitamin D deficiency, unspecified: Secondary | ICD-10-CM

## 2019-09-25 MED ORDER — ALENDRONATE SODIUM 70 MG PO TABS: 70.0000 mg | ORAL_TABLET | ORAL | 3 refills | Status: DC

## 2019-09-25 MED ORDER — CALCIUM CARBONATE-VITAMIN D 500-400 MG-UNIT PO TABS: 1.0000 | ORAL_TABLET | Freq: Two times a day (BID) | ORAL | 3 refills | Status: DC

## 2019-09-25 NOTE — Patient Instructions (Addendum)
Follow-up in office with MD in early January call if you need me sooner.  You are referred for eye exam.  You are referred to orthopedic Dr. Aline Brochure to evaluate your back and hip pain.  New diagnosis is osteoporosis.  You prescribed once weekly Fosamax and you are also prescribed calcium with D1 tablet twice daily.  Continue once weekly vitamin D.   Fasting lipid CMP and EGFR vitamin D, cBC, tSH and hepatitis C screen first week in January  It is important that you exercise regularly at least 30 minutes 5 times a week. If you develop chest pain, have severe difficulty breathing, or feel very tired, stop exercising immediately and seek medical attention  Think about what you will eat, plan ahead. Choose " clean, green, fresh or frozen" over canned, processed or packaged foods which are more sugary, salty and fatty. 70 to 75% of food eaten should be vegetables and fruit. Three meals at set times with snacks allowed between meals, but they must be fruit or vegetables. Aim to eat over a 12 hour period , example 7 am to 7 pm, and STOP after  your last meal of the day. Drink water,generally about 64 ounces per day, no other drink is as healthy. Fruit juice is best enjoyed in a healthy way, by EATING the fruit.

## 2019-09-29 ENCOUNTER — Encounter: Payer: Self-pay | Admitting: Family Medicine

## 2019-09-29 DIAGNOSIS — H547 Unspecified visual loss: Secondary | ICD-10-CM | POA: Insufficient documentation

## 2019-09-29 NOTE — Assessment & Plan Note (Signed)
Refer ophthalmology for eval

## 2019-09-29 NOTE — Progress Notes (Signed)
     Theresa Garcia     MRN: [id number]      DOB: [birth year]  HPI: Patient is in for annual physical exam. C/o increased back and hiip pain limiting  Ability to do minor chores like washing dishes. Recent labs, if available are reviewed. Immunization is reviewed , and  updated if needed.   PE: BP (!) 118/20   Pulse 76   Resp 16   Ht 5\' 7"  (1.702 m)   Wt 194 lb (88 kg)   SpO2 96%   BMI 30.38 kg/m   Pleasant  female, alert and oriented x 3, in no cardio-pulmonary distress. Afebrile. HEENT No facial trauma or asymetry. Sinuses non tender.  Extra occullar muscles intact.. External ears normal, . Neck: decreased ROM, no adenopathy,JVD or thyromegaly.No bruits.  Chest: Clear to ascultation bilaterally.No crackles or wheezes. Non tender to palpation  Breast: Not examined, normal mammogram which is up to date   Cardiovascular system; Heart sounds normal,  S1 and  S2 ,no S3.  No murmur, or thrill. Apical beat not displaced Peripheral pulses normal.  Abdomen: Soft, non tender, no organomegaly or masses. No bruits. Bowel sounds normal. No guarding, tenderness or rebound. : MS: decreased though adequate ROM of spine, hips , shoulders and knees  Neurologic: Cranial nerves 2 to 12 intact. Power, tone ,sensation and reflexes normal throughout. No disturbance in gait. No tremor.  Skin: Intact, no ulceration, erythema , scaling or rash noted. Pigmentation normal throughout  Psych; Normal mood and affect. Judgement and concentration normal   Assessment & Plan:  Annual physical exam Annual exam as documented. Counseling done  re healthy lifestyle involving commitment to 150 minutes exercise per week, heart healthy diet, and attaining healthy weight.The importance of adequate sleep also discussed. Regular seat belt use and home safety, is also discussed. Changes in health habits are decided on by the patient with goals and time frames  set for achieving  them. Immunization and cancer screening needs are specifically addressed at this visit.   Back pain increeased back and hip pain limiting basic function, refer Ortho, no recent trauma  Reduced vision Refer ophthalmology for eval

## 2019-09-29 NOTE — Assessment & Plan Note (Signed)
increeased back and hip pain limiting basic function, refer Ortho, no recent trauma

## 2019-09-29 NOTE — Assessment & Plan Note (Signed)

## 2019-10-05 ENCOUNTER — Telehealth: Payer: Self-pay

## 2019-10-05 NOTE — Telephone Encounter (Signed)
Please advise 

## 2019-10-05 NOTE — Telephone Encounter (Signed)
Theresa Garcia called and said that pt Rx for alendronate isn't  Covered by the insurance cand you send another Rx in place of this?

## 2019-10-05 NOTE — Telephone Encounter (Signed)
Pls contact ins and see what the preferred is , I will prescribe, let pt knwo we are working on this also, thanks

## 2019-10-06 ENCOUNTER — Other Ambulatory Visit: Payer: Self-pay | Admitting: Family Medicine

## 2019-10-09 NOTE — Telephone Encounter (Signed)
This has been resolved

## 2019-10-16 ENCOUNTER — Ambulatory Visit (INDEPENDENT_AMBULATORY_CARE_PROVIDER_SITE_OTHER): Payer: Medicare HMO | Admitting: Orthopedic Surgery

## 2019-10-16 ENCOUNTER — Encounter: Payer: Self-pay | Admitting: Orthopedic Surgery

## 2019-10-16 ENCOUNTER — Other Ambulatory Visit: Payer: Self-pay

## 2019-10-16 ENCOUNTER — Ambulatory Visit: Payer: Medicare HMO

## 2019-10-16 VITALS — BP 145/86 | HR 83 | Ht 66.0 in | Wt 194.0 lb

## 2019-10-16 DIAGNOSIS — M545 Low back pain, unspecified: Secondary | ICD-10-CM

## 2019-10-16 MED ORDER — METHOCARBAMOL 500 MG PO TABS: 500.0000 mg | ORAL_TABLET | Freq: Three times a day (TID) | ORAL | 1 refills | Status: DC | PRN

## 2019-10-16 NOTE — Patient Instructions (Addendum)
Physical therapy has been ordered for you at [facility] / in [place] they will call you to schedule. Please let us know if you do not hear anything within one week.  Take Tylenol for pain if pain still not relieved take the Robaxin prescription Chronic Back Pain When back pain lasts longer than 3 months, it is called chronic back pain. Pain may get worse at certain times (flare-ups). There are things you can do at home to manage your pain. Follow these instructions at home: Activity      Avoid bending and other activities that make pain worse.  When standing: ? Keep your upper back and neck straight. ? Keep your shoulders pulled back. ? Avoid slouching.  When sitting: ? Keep your back straight. ? Relax your shoulders. Do not round your shoulders or pull them backward.  Do not sit or stand in one place for long periods of time.  Take short rest breaks during the day. Lying down or standing is usually better than sitting. Resting can help relieve pain.  When sitting or lying down for a long time, do some mild activity or stretching. This will help to prevent stiffness and pain.  Get regular exercise. Ask your doctor what activities are safe for you.  Do not lift anything that is heavier than 10 lb (4.5 kg). To prevent injury when you lift things: ? Bend your knees. ? Keep the weight close to your body. ? Avoid twisting. Managing pain  If told, put ice on the painful area. Your doctor may tell you to use ice for 24-48 hours after a flare-up starts. ? Put ice in a plastic bag. ? Place a towel between your skin and the bag. ? Leave the ice on for 20 minutes, 2-3 times a day.  If told, put heat on the painful area as often as told by your doctor. Use the heat source that your doctor recommends, such as a moist heat pack or a heating pad. ? Place a towel between your skin and the heat source. ? Leave the heat on for 20-30 minutes. ? Remove the heat if your skin turns bright red. This  is especially important if you are unable to feel pain, heat, or cold. You may have a greater risk of getting burned.  Soak in a warm bath. This can help relieve pain.  Take over-the-counter and prescription medicines only as told by your doctor. General instructions  Sleep on a firm mattress. Try lying on your side with your knees slightly bent. If you lie on your back, put a pillow under your knees.  Keep all follow-up visits as told by your doctor. This is important. Contact a doctor if:  You have pain that does not get better with rest or medicine. Get help right away if:  One or both of your arms or legs feel weak.  One or both of your arms or legs lose feeling (numbness).  You have trouble controlling when you poop (bowel movement) or pee (urinate).  You feel sick to your stomach (nauseous).  You throw up (vomit).  You have belly (abdominal) pain.  You have shortness of breath.  You pass out (faint). Summary  When back pain lasts longer than 3 months, it is called chronic back pain.  Pain may get worse at certain times (flare-ups).  Use ice and heat as told by your doctor. Your doctor may tell you to use ice after flare-ups. This information is not intended to replace  advice given to you by your health care provider. Make sure you discuss any questions you have with your health care provider. Document Revised: 06/02/2018 Document Reviewed: 09/24/2016 Elsevier Patient Education  2020 [employer].

## 2019-10-16 NOTE — Progress Notes (Signed)
NEW PROBLEM//OFFICE VISIT  Chief Complaint  Patient presents with  . Back Pain    for 3 months     79 years old 72-monthhistory of currents of back pain. Has a history of chronic back pain. Currently on Eliquis. No prior treatment. Pain radiates to the hips and into the lower legs no numbness or tingling in the lower legs no history of trauma history of osteoporosis   Review of Systems  Constitutional: Positive for malaise/fatigue.  Cardiovascular: Positive for leg swelling.  Musculoskeletal: Positive for back pain.     Past Medical History:  Diagnosis Date  . [place]  . Bronchitis   . [place]   . Chronic back pain   . Depression   . GERD (gastroesophageal reflux disease)   . Hyperlipidemia   . Hypertension   . Insomnia   . Kidney stone   . Personal history of chemotherapy   . Personal history of radiation therapy   . Pneumonia   . Vertigo     Past Surgical History:  Procedure Laterality Date  . ABDOMINAL HYSTERECTOMY    . BREAST LUMPECTOMY Left 2002  . BREAST SURGERY     left  . CYSTOSCOPY/RETROGRADE/URETEROSCOPY/STONE EXTRACTION WITH BASKET  01/07/2011   Procedure: CYSTOSCOPY/RETROGRADE/URETEROSCOPY/STONE EXTRACTION WITH BASKET;  Surgeon: MMarissa Nestle  Location: [facility];  Service: Urology;  Laterality: Left;  Balloon Dilatation; stone given to family per MD  . TUBAL LIGATION      Family History  Problem Relation Age of Onset  . COPD Father   . COPD Sister   . COPD Sister   . Diabetes Sister   . Diabetes Brother   . Seizures Mother   . Bone cancer Brother   . Heart disease Brother   . Aneurysm Brother        brain  . HIV/AIDS Brother    Social History   Tobacco Use  . Smoking status: Former Smoker    Packs/day: 1.00    Years: 5.00    Pack years: 5.00    Types: Cigarettes    Quit date: 09/07/1980    Years since quitting: 39.1  . Smokeless tobacco: Never Used  . Tobacco comment: quit 25+ years ago  Substance Use  Topics  . Alcohol use: No    Alcohol/week: 0.0 standard drinks  . Drug use: No    Allergies  Allergen Reactions  . Meclizine Diarrhea  . Penicillins Rash and Other (See Comments)    Has patient had a PCN reaction causing immediate rash, facial/tongue/throat swelling, SOB or lightheadedness with hypotension: no Has patient had a PCN reaction causing severe rash involving mucus membranes or skin necrosis: No Has patient had a PCN reaction that required hospitalization No Has patient had a PCN reaction occurring within the last 10 years: No If all of the above answers are "NO", then may proceed with Cephalosporin use.     Current Meds  Medication Sig  . acetaminophen (TYLENOL) 500 MG tablet Take 1,000 mg by mouth every 6 (six) hours as needed for mild pain or moderate pain.   .Marland Kitchenacyclovir (ZOVIRAX) 400 MG tablet Take 1 tablet by mouth twice daily  . alendronate (FOSAMAX) 70 MG tablet Take 1 tablet (70 mg total) by mouth every 7 (seven) days. Take with a full glass of water on an empty stomach.  .Marland KitchenamLODipine-benazepril (LOTREL) 5-40 MG capsule TAKE 1 CAPSULE DAILY  . atorvastatin (LIPITOR) 20 MG tablet TAKE 1 TABLET EVERY EVENING  .  calcium-vitamin D (OSCAL-500) 500-400 MG-UNIT tablet Take 1 tablet by mouth 2 (two) times daily.  Marland Kitchen ELIQUIS 5 MG TABS tablet Take 1 tablet by mouth twice daily  . ergocalciferol (VITAMIN D2) 1.25 MG (50000 UT) capsule Take 1 capsule (50,000 Units total) by mouth once a week. One capsule once weekly  . FLUoxetine (PROZAC) 10 MG capsule Take 1 capsule by mouth once daily  . Misc. Devices ([place]) MISC 1 each by Does not apply route daily as needed.  Marland Kitchen Respiratory Therapy Supplies (AIRS DISPOSABLE NEBULIZER) KIT 1 Units by Does not apply route daily as needed.  . temazepam (RESTORIL) 30 MG capsule TAKE 1 CAPSULE BY MOUTH AT BEDTIME AS NEEDED FOR SLEEP  . vitamin C (ASCORBIC ACID) 500 MG tablet Take 500 mg by mouth daily.    BP (!) 145/86   Pulse  83   Ht _0  (1.676 m)   Wt 194 lb (88 kg)   BMI 31.31 kg/m   Physical Exam Constitutional:      General: She is not in acute distress.    Appearance: Normal appearance.  HENT:     Head: Normocephalic.  Eyes:     General:        Right eye: No discharge.        Left eye: No discharge.     Conjunctiva/sclera: Conjunctivae normal.  Skin:    General: Skin is warm and dry.     Capillary Refill: Capillary refill takes less than 2 seconds.     Coloration: Skin is not jaundiced.  Neurological:     Mental Status: She is alert.     Sensory: No sensory deficit.     Motor: No weakness.     Gait: Gait abnormal.     Deep Tendon Reflexes: Reflexes normal.  Psychiatric:        Mood and Affect: Mood normal.        Thought Content: Thought content normal.     Ortho Exam  Back  Tender L4-S1  Decrease rom   Normal tone   SLR rt -no radicular symptoms but stretching and pulling on the back  SLR lt -again no radicular symptoms however increased tension in the back   Lower extrems: normal rom , alignment stablity and strength   MEDICAL DECISION MAKING  A.  Encounter Diagnosis  Name Primary?  . Lumbar pain Yes    B. DATA ANALYSED:   IMAGING: Interpretation of images: 2012 images in 2016 images lumbar spine in 2012 there is no acute fracture or dislocation there was L5-S1 degenerative disease  In the 2016 x-ray we see that this has worsened  Both series included 5 lumbar spine films  Today's x-ray in the office  Orders: PT  Outside records reviewed: No   C. MANAGEMENT   Physical therapy and Robaxin, recommended due to Eliquis patient can also take Tylenol for pain  Meds ordered this encounter  Medications  . methocarbamol (ROBAXIN) 500 MG tablet    Sig: Take 1 tablet (500 mg total) by mouth every 8 (eight) hours as needed for muscle spasms.    Dispense:  60 tablet    Refill:  1   Prn f/u   Arther Abbott, MD  10/16/2019 2:15 PM

## 2019-11-02 ENCOUNTER — Other Ambulatory Visit: Payer: Self-pay | Admitting: Family Medicine

## 2019-11-03 ENCOUNTER — Ambulatory Visit (INDEPENDENT_AMBULATORY_CARE_PROVIDER_SITE_OTHER): Payer: Medicare HMO

## 2019-11-03 ENCOUNTER — Other Ambulatory Visit: Payer: Self-pay

## 2019-11-03 DIAGNOSIS — Z23 Encounter for immunization: Secondary | ICD-10-CM | POA: Diagnosis not present

## 2019-11-09 ENCOUNTER — Other Ambulatory Visit: Payer: Self-pay | Admitting: Family Medicine

## 2019-11-10 ENCOUNTER — Other Ambulatory Visit: Payer: Self-pay | Admitting: Family Medicine

## 2019-11-10 MED ORDER — TEMAZEPAM 30 MG PO CAPS: 30.0000 mg | ORAL_CAPSULE | Freq: Every evening | ORAL | 5 refills | Status: DC | PRN

## 2019-11-10 NOTE — Telephone Encounter (Signed)
Medication prescribed

## 2019-11-10 NOTE — Telephone Encounter (Signed)
Requests refills of temazepam

## 2019-12-18 ENCOUNTER — Other Ambulatory Visit: Payer: Self-pay

## 2019-12-18 MED ORDER — ERGOCALCIFEROL 1.25 MG (50000 UT) PO CAPS: 50000.0000 [IU] | ORAL_CAPSULE | ORAL | 2 refills | Status: DC

## 2019-12-18 MED ORDER — ALENDRONATE SODIUM 70 MG PO TABS: 70.0000 mg | ORAL_TABLET | ORAL | 3 refills | Status: DC

## 2020-02-02 ENCOUNTER — Other Ambulatory Visit: Payer: Self-pay | Admitting: Family Medicine

## 2020-02-26 ENCOUNTER — Encounter: Payer: Self-pay | Admitting: Family Medicine

## 2020-02-26 ENCOUNTER — Telehealth: Payer: Medicare HMO | Admitting: Family Medicine

## 2020-02-26 ENCOUNTER — Other Ambulatory Visit: Payer: Self-pay

## 2020-02-26 VITALS — Ht 66.0 in | Wt 195.4 lb

## 2020-02-26 DIAGNOSIS — F5104 Psychophysiologic insomnia: Secondary | ICD-10-CM | POA: Diagnosis not present

## 2020-02-26 DIAGNOSIS — E042 Nontoxic multinodular goiter: Secondary | ICD-10-CM | POA: Insufficient documentation

## 2020-02-26 DIAGNOSIS — F418 Other specified anxiety disorders: Secondary | ICD-10-CM

## 2020-02-26 DIAGNOSIS — E782 Mixed hyperlipidemia: Secondary | ICD-10-CM | POA: Diagnosis not present

## 2020-02-26 DIAGNOSIS — E669 Obesity, unspecified: Secondary | ICD-10-CM

## 2020-02-26 DIAGNOSIS — I10 Essential (primary) hypertension: Secondary | ICD-10-CM | POA: Diagnosis not present

## 2020-02-26 DIAGNOSIS — R221 Localized swelling, mass and lump, neck: Secondary | ICD-10-CM

## 2020-02-26 MED ORDER — APIXABAN 5 MG PO TABS: 5.0000 mg | ORAL_TABLET | Freq: Two times a day (BID) | ORAL | 1 refills | Status: DC

## 2020-02-26 NOTE — Assessment & Plan Note (Signed)
  Patient re-educated about  the importance of commitment to a  minimum of 150 minutes of exercise per week as able.  The importance of healthy food choices with portion control discussed, as well as eating regularly and within a 12 hour window most days. The need to choose "clean , green" food 50 to 75% of the time is discussed, as well as to make water the primary drink and set a goal of 64 ounces water daily.    Weight /BMI 02/26/2020 10/16/2019 09/25/2019  WEIGHT 195 lb 6.4 oz 194 lb 194 lb  HEIGHT 5\' 6"  5\' 6"  5\' 7"   BMI 31.54 kg/m2 31.31 kg/m2 30.38 kg/m2

## 2020-02-26 NOTE — Assessment & Plan Note (Signed)
Controlled, no change in medication  

## 2020-02-26 NOTE — Assessment & Plan Note (Signed)
Sleep hygiene reviewed and written information offered also. Prescription sent for  medication needed.  

## 2020-02-26 NOTE — Assessment & Plan Note (Signed)
3 week h/o lump on right neck, pt to watch and if increases in size , call in for further evaluation

## 2020-02-26 NOTE — Assessment & Plan Note (Signed)
Hyperlipidemia:Low fat diet discussed and encouraged.   Lipid Panel  Lab Results  Component Value Date   CHOL 159 04/25/2019   HDL 63 04/25/2019   LDLCALC 79 04/25/2019   TRIG 87 04/25/2019   CHOLHDL 2.5 04/25/2019     Updated lab needed at/ before next visit.

## 2020-02-26 NOTE — Assessment & Plan Note (Signed)
Controlled, no change in medication DASH diet and commitment to daily physical activity for a minimum of 30 minutes discussed and encouraged, as a part of hypertension management. The importance of attaining a healthy weight is also discussed.  BP/Weight 02/26/2020 10/16/2019 09/25/2019 09/21/2019 03/30/2019 03/21/2019 09/21/2018  Systolic BP - 145 118 136 136 498 100  Diastolic BP - 86 20 78 78 78 72  Wt. (Lbs) 195.4 194 194 198 198 198.21 192  BMI 31.54 31.31 30.38 31.96 31.96 31.99 30.99

## 2020-02-26 NOTE — Progress Notes (Signed)
Virtual Visit via Telephone Note  I connected with Theresa Garcia on 02/26/20 at  1:00 PM EST by telephone and verified that I am speaking with the correct person using two identifiers.  Location: Patient:home Provider: office   I discussed the limitations, risks, security and privacy concerns of performing an evaluation and management service by telephone and the availability of in person appointments. I also discussed with the patient that there may be a patient responsible charge related to this service. The patient expressed understanding and agreed to proceed.   History of Present Illness: 3 week h/o soft lump on right neck size between nickel and a quarter, non tender, no drainage No lumps anywhere else ROS otherwise negative Denies recent fever or chills. Denies sinus pressure, nasal congestion, ear pain or sore throat. Denies chest congestion, productive cough or wheezing. Denies chest pains, palpitations and leg swelling Denies abdominal pain, nausea, vomiting,diarrhea or constipation.   Denies dysuria, frequency, hesitancy or incontinence. Denies joint pain, swelling and limitation in mobility. Denies headaches, seizures, numbness, or tingling. Denies uncontrolled  depression, anxiety or insomnia.      Observations/Objective: Ht 5\' 6"  (1.676 m)   Wt 195 lb 6.4 oz (88.6 kg)   BMI 31.54 kg/m  Good communication with no confusion and intact memory. Alert and oriented x 3 No signs of respiratory distress during speech    Assessment and Plan: Hyperlipidemia Hyperlipidemia:Low fat diet discussed and encouraged.   Lipid Panel  Lab Results  Component Value Date   CHOL 159 04/25/2019   HDL 63 04/25/2019   LDLCALC 79 04/25/2019   TRIG 87 04/25/2019   CHOLHDL 2.5 04/25/2019     Updated lab needed at/ before next visit.   Essential hypertension Controlled, no change in medication DASH diet and commitment to daily physical activity for a minimum of 30  minutes discussed and encouraged, as a part of hypertension management. The importance of attaining a healthy weight is also discussed.  BP/Weight 02/26/2020 10/16/2019 09/25/2019 09/21/2019 03/30/2019 03/21/2019 [id number]  Systolic BP - [id number]  Diastolic BP - 86 20 78 78 78 72  Wt. (Lbs) 195.4 194 194 198 198 198.21 192  BMI 31.54 31.31 30.38 31.96 31.96 31.99 30.99       Insomnia Sleep hygiene reviewed and written information offered also. Prescription sent for  medication needed.   Obesity (BMI 30.0-34.9)  Patient re-educated about  the importance of commitment to a  minimum of 150 minutes of exercise per week as able.  The importance of healthy food choices with portion control discussed, as well as eating regularly and within a 12 hour window most days. The need to choose "clean , green" food 50 to 75% of the time is discussed, as well as to make water the primary drink and set a goal of 64 ounces water daily.    Weight /BMI 02/26/2020 10/16/2019 09/25/2019  WEIGHT 195 lb 6.4 oz 194 lb 194 lb  HEIGHT 5\' 6"  5\' 6"  5\' 7"   BMI 31.54 kg/m2 31.31 kg/m2 30.38 kg/m2      Depression with anxiety Controlled, no change in medication   Localized swelling, mass or lump of neck 3 week h/o lump on right neck, pt to watch and if increases in size , call in for further evaluation    Follow Up Instructions:    I discussed the assessment and treatment plan with the patient. The patient was provided an opportunity to ask questions and all were answered.  The patient agreed with the plan and demonstrated an understanding of the instructions.   The patient was advised to call back or seek an in-person evaluation if the symptoms worsen or if the condition fails to improve as anticipated.  I provided 20 minutes of non-face-to-face time during this encounter.   Syliva Overman, MD

## 2020-02-26 NOTE — Patient Instructions (Signed)
Annual physical exam in office with MD end August, call if you need me sooner  Please get fasting labs already ordered in the next 1 to 2 weeks  Thankful you feel well overall.  If lump on right neck hurts or increases in size or does not go away in the next 4 to 6 week, please schedule an in office evaluation. You report it is about the size of a quarter and currently painless  No changes in current medications  Please be careful not to fall  Thanks for choosing [facility], we consider it a privelige to serve you.

## 2020-03-18 ENCOUNTER — Other Ambulatory Visit: Payer: Self-pay | Admitting: Family Medicine

## 2020-03-22 ENCOUNTER — Other Ambulatory Visit: Payer: Self-pay | Admitting: Family Medicine

## 2020-03-22 LAB — CMP14+EGFR
ALT: 13 IU/L (ref 0–32)
AST: 15 IU/L (ref 0–40)
Albumin/Globulin Ratio: 1.4 (ref 1.2–2.2)
Albumin: 4.1 g/dL (ref 3.7–4.7)
Alkaline Phosphatase: 59 IU/L (ref 44–121)
BUN/Creatinine Ratio: 19 (ref 12–28)
BUN: 15 mg/dL (ref 8–27)
Bilirubin Total: 0.4 mg/dL (ref 0.0–1.2)
CO2: 20 mmol/L (ref 20–29)
Calcium: 9.6 mg/dL (ref 8.7–10.3)
Chloride: 106 mmol/L (ref 96–106)
Creatinine, Ser: 0.79 mg/dL (ref 0.57–1.00)
GFR calc Af Amer: 84 mL/min/{1.73_m2} (ref >59)
GFR calc non Af Amer: 72 mL/min/{1.73_m2} (ref >59)
Globulin, Total: 2.9 g/dL (ref 1.5–4.5)
Glucose: 92 mg/dL (ref 65–99)
Potassium: 4.3 mmol/L (ref 3.5–5.2)
Sodium: 140 mmol/L (ref 134–144)
Total Protein: 7 g/dL (ref 6.0–8.5)

## 2020-03-22 LAB — CBC
Hematocrit: 40.2 % (ref 34.0–46.6)
Hemoglobin: 13.6 g/dL (ref 11.1–15.9)
MCH: 31.7 pg (ref 26.6–33.0)
MCHC: 33.8 g/dL (ref 31.5–35.7)
MCV: 94 fL (ref 79–97)
Platelets: 277 10*3/uL (ref 150–450)
RBC: 4.29 x10E6/uL (ref 3.77–5.28)
RDW: 12.8 % (ref 11.7–15.4)
WBC: 7.3 10*3/uL (ref 3.4–10.8)

## 2020-03-22 LAB — LIPID PANEL
Chol/HDL Ratio: 2.6 ratio (ref 0.0–4.4)
Cholesterol, Total: 141 mg/dL (ref 100–199)
HDL: 55 mg/dL (ref >39)
LDL Chol Calc (NIH): 71 mg/dL (ref 0–99)
Triglycerides: 79 mg/dL (ref 0–149)
VLDL Cholesterol Cal: 15 mg/dL (ref 5–40)

## 2020-03-22 LAB — TSH: TSH: 0.942 u[IU]/mL (ref 0.450–4.500)

## 2020-03-22 LAB — VITAMIN D 25 HYDROXY (VIT D DEFICIENCY, FRACTURES): Vit D, 25-Hydroxy: 47.3 ng/mL (ref 30.0–100.0)

## 2020-03-22 LAB — HEPATITIS C ANTIBODY: Hep C Virus Ab: <0.1 s/co ratio (ref 0.0–0.9)

## 2020-03-25 ENCOUNTER — Telehealth (INDEPENDENT_AMBULATORY_CARE_PROVIDER_SITE_OTHER): Payer: Medicare HMO | Admitting: Nurse Practitioner

## 2020-03-25 ENCOUNTER — Other Ambulatory Visit: Payer: Self-pay

## 2020-03-25 ENCOUNTER — Encounter: Payer: Self-pay | Admitting: Nurse Practitioner

## 2020-03-25 DIAGNOSIS — M25562 Pain in left knee: Secondary | ICD-10-CM | POA: Diagnosis not present

## 2020-03-25 NOTE — Progress Notes (Signed)
Acute Office Visit  Subjective:    Patient ID: Theresa Garcia, female    DOB: 1940/07/16, 80 y.o.   MRN: [id number]  Chief Complaint  Patient presents with  . Knee Pain    Pt fell 03/22/20, pain from sitting to standing, some swelling     HPI Patient is in today for fall that occurred 03/22/20.  She is having left knee pain. Her pain is 7 at rest, and she rates it at 10/10 when she tries to stand up.  She states she uses a walker and a cane for ambulation and she has not noticed any instability.  She has tried tylenol, but that is not helping much when she tries to stand up.  She is able to walk on it after she is up and moving around.  She states she slipped out of her chair.  She sits on 3 pillows, and when she tried to sit up, her pillow moved and she fell down.  Denies bruising today.  She did not lose consciousness.  Past Medical History:  Diagnosis Date  . Breast cancer ([place]) 02/24/2000  . Bronchitis   . Cancer ([place])   . Chronic back pain   . Depression   . GERD (gastroesophageal reflux disease)   . Hyperlipidemia   . Hypertension   . Insomnia   . Kidney stone   . Personal history of chemotherapy   . Personal history of radiation therapy   . Pneumonia   . Vertigo     Past Surgical History:  Procedure Laterality Date  . ABDOMINAL HYSTERECTOMY    . BREAST LUMPECTOMY Left 2002  . BREAST SURGERY     left  . CYSTOSCOPY/RETROGRADE/URETEROSCOPY/STONE EXTRACTION WITH BASKET  01/07/2011   Procedure: CYSTOSCOPY/RETROGRADE/URETEROSCOPY/STONE EXTRACTION WITH BASKET;  Surgeon: Marissa Nestle;  Location: [facility];  Service: Urology;  Laterality: Left;  Balloon Dilatation; stone given to family per MD  . TUBAL LIGATION      Family History  Problem Relation Age of Onset  . COPD Father   . COPD Sister   . COPD Sister   . Diabetes Sister   . Diabetes Brother   . Seizures Mother   . Bone cancer Brother   . Heart disease Brother   . Aneurysm Brother        brain  .  HIV/AIDS Brother     Social History   Socioeconomic History  . Marital status: Divorced    Spouse name: Not on file  . Number of children: 2  . Years of education: Not on file  . Highest education level: Not on file  Occupational History  . Not on file  Tobacco Use  . Smoking status: Former Smoker    Packs/day: 1.00    Years: 5.00    Pack years: 5.00    Types: Cigarettes    Quit date: 09/07/1980    Years since quitting: 39.5  . Smokeless tobacco: Never Used  . Tobacco comment: quit 25+ years ago  Substance and Sexual Activity  . Alcohol use: No    Alcohol/week: 0.0 standard drinks  . Drug use: No  . Sexual activity: Not Currently  Other Topics Concern  . Not on file  Social History Narrative  . Not on file   Social Determinants of Health   Financial Resource Strain: Low Risk   . Difficulty of Paying Living Expenses: Not hard at all  Food Insecurity: No Food Insecurity  . Worried About Charity fundraiser in  the Last Year: Never true  . Ran Out of Food in the Last Year: Never true  Transportation Needs: No Transportation Needs  . Lack of Transportation (Medical): No  . Lack of Transportation (Non-Medical): No  Physical Activity: Insufficiently Active  . Days of Exercise per Week: 1 day  . Minutes of Exercise per Session: 10 min  Stress: No Stress Concern Present  . Feeling of Stress : Not at all  Social Connections: Moderately Isolated  . Frequency of Communication with Friends and Family: More than three times a week  . Frequency of Social Gatherings with Friends and Family: More than three times a week  . Attends Religious Services: More than 4 times per year  . Active Member of Clubs or Organizations: No  . Attends Archivist Meetings: Never  . Marital Status: Divorced  Human resources officer Violence: Not At Risk  . Fear of Current or Ex-Partner: No  . Emotionally Abused: No  . Physically Abused: No  . Sexually Abused: No    Outpatient Medications  Prior to Visit  Medication Sig Dispense Refill  . acetaminophen (TYLENOL) 500 MG tablet Take 1,000 mg by mouth every 6 (six) hours as needed for mild pain or moderate pain.     Marland Kitchen acyclovir (ZOVIRAX) 400 MG tablet Take 1 tablet by mouth twice daily 180 tablet 0  . alendronate (FOSAMAX) 70 MG tablet Take 1 tablet (70 mg total) by mouth every 7 (seven) days. Take with a full glass of water on an empty stomach. 12 tablet 3  . amLODipine-benazepril (LOTREL) 5-40 MG capsule TAKE 1 CAPSULE DAILY 90 capsule 3  . apixaban (ELIQUIS) 5 MG TABS tablet Take 1 tablet (5 mg total) by mouth 2 (two) times daily. 180 tablet 1  . atorvastatin (LIPITOR) 20 MG tablet TAKE 1 TABLET EVERY EVENING 90 tablet 3  . calcium-vitamin D (OSCAL-500) 500-400 MG-UNIT tablet Take 1 tablet by mouth 2 (two) times daily. 180 tablet 3  . ergocalciferol (VITAMIN D2) 1.25 MG (50000 UT) capsule Take 1 capsule (50,000 Units total) by mouth once a week. One capsule once weekly 12 capsule 2  . FLUoxetine (PROZAC) 10 MG capsule Take 1 capsule by mouth once daily 90 capsule 0  . methocarbamol (ROBAXIN) 500 MG tablet Take 1 tablet (500 mg total) by mouth every 8 (eight) hours as needed for muscle spasms. 60 tablet 1  . Misc. Devices ([place]) MISC 1 each by Does not apply route daily as needed. 1 each 0  . Respiratory Therapy Supplies (AIRS DISPOSABLE NEBULIZER) KIT 1 Units by Does not apply route daily as needed. 1 each 0  . temazepam (RESTORIL) 30 MG capsule Take 1 capsule (30 mg total) by mouth at bedtime as needed for sleep. 30 capsule 5  . vitamin C (ASCORBIC ACID) 500 MG tablet Take 500 mg by mouth daily.     No facility-administered medications prior to visit.    Allergies  Allergen Reactions  . Meclizine Diarrhea  . Penicillins Rash and Other (See Comments)    Has patient had a PCN reaction causing immediate rash, facial/tongue/throat swelling, SOB or lightheadedness with hypotension: no Has patient had a PCN reaction  causing severe rash involving mucus membranes or skin necrosis: No Has patient had a PCN reaction that required hospitalization No Has patient had a PCN reaction occurring within the last 10 years: No If all of the above answers are "NO", then may proceed with Cephalosporin use.     Review of  Systems  Constitutional: Negative.   Musculoskeletal: Positive for arthralgias.       Left knee pain per HPI       Objective:    Physical Exam  There were no vitals taken for this visit. Wt Readings from Last 3 Encounters:  02/26/20 195 lb 6.4 oz (88.6 kg)  10/16/19 194 lb (88 kg)  09/25/19 194 lb (88 kg)    There are no preventive care reminders to display for this patient.  There are no preventive care reminders to display for this patient.   Lab Results  Component Value Date   TSH 0.942 03/21/2020   Lab Results  Component Value Date   WBC 7.3 03/21/2020   HGB 13.6 03/21/2020   HCT 40.2 03/21/2020   MCV 94 03/21/2020   PLT 277 03/21/2020   Lab Results  Component Value Date   NA 140 03/21/2020   K 4.3 03/21/2020   CO2 20 03/21/2020   GLUCOSE 92 03/21/2020   BUN 15 03/21/2020   CREATININE 0.79 03/21/2020   BILITOT 0.4 03/21/2020   ALKPHOS 59 03/21/2020   AST 15 03/21/2020   ALT 13 03/21/2020   PROT 7.0 03/21/2020   ALBUMIN 4.1 03/21/2020   CALCIUM 9.6 03/21/2020   ANIONGAP 10 04/01/2017   Lab Results  Component Value Date   CHOL 141 03/21/2020   Lab Results  Component Value Date   HDL 55 03/21/2020   Lab Results  Component Value Date   LDLCALC 71 03/21/2020   Lab Results  Component Value Date   TRIG 79 03/21/2020   Lab Results  Component Value Date   CHOLHDL 2.6 03/21/2020   No results found for: HGBA1C     Assessment & Plan:   Problem List Items Addressed This Visit   None      No orders of the defined types were placed in this encounter.  Date:  03/25/2020   Location of Patient: Home Location of Provider: Office Consent was obtain  for visit to be over via telehealth. I verified that I am speaking with the correct person using two identifiers.  I connected with  Deniece Portela on 03/25/20 via telephone and verified that I am speaking with the correct person using two identifiers.   I discussed the limitations of evaluation and management by telemedicine. The patient expressed understanding and agreed to proceed.  Time spent: 9 minutes   Noreene Larsson, NP

## 2020-03-25 NOTE — Assessment & Plan Note (Signed)
-  fell 03/22/20 -will get x-ray of left knee to r/o fracture -continue to use tylenol -if no fracture, would consider ortho consult if pain has not improved by the end of the week

## 2020-04-15 ENCOUNTER — Encounter (HOSPITAL_COMMUNITY): Payer: Self-pay

## 2020-04-15 ENCOUNTER — Telehealth: Payer: Self-pay

## 2020-04-15 ENCOUNTER — Other Ambulatory Visit: Payer: Self-pay

## 2020-04-15 ENCOUNTER — Emergency Department (HOSPITAL_COMMUNITY)
Admission: EM | Admit: 2020-04-15 | Discharge: 2020-04-15 | Disposition: A | Discharge status: Home / Self Care | Payer: Medicare HMO | Attending: Emergency Medicine | Admitting: Emergency Medicine

## 2020-04-15 ENCOUNTER — Emergency Department (HOSPITAL_COMMUNITY): Payer: Medicare HMO

## 2020-04-15 DIAGNOSIS — M25562 Pain in left knee: Secondary | ICD-10-CM | POA: Diagnosis present

## 2020-04-15 DIAGNOSIS — I10 Essential (primary) hypertension: Secondary | ICD-10-CM | POA: Diagnosis not present

## 2020-04-15 DIAGNOSIS — Y9389 Activity, other specified: Secondary | ICD-10-CM | POA: Insufficient documentation

## 2020-04-15 DIAGNOSIS — W19XXXA Unspecified fall, initial encounter: Secondary | ICD-10-CM | POA: Insufficient documentation

## 2020-04-15 DIAGNOSIS — Y999 Unspecified external cause status: Secondary | ICD-10-CM | POA: Insufficient documentation

## 2020-04-15 DIAGNOSIS — Y9289 Other specified places as the place of occurrence of the external cause: Secondary | ICD-10-CM | POA: Diagnosis not present

## 2020-04-15 DIAGNOSIS — Z87891 Personal history of nicotine dependence: Secondary | ICD-10-CM | POA: Diagnosis not present

## 2020-04-15 DIAGNOSIS — Z79899 Other long term (current) drug therapy: Secondary | ICD-10-CM | POA: Insufficient documentation

## 2020-04-15 DIAGNOSIS — Z853 Personal history of malignant neoplasm of breast: Secondary | ICD-10-CM | POA: Diagnosis not present

## 2020-04-15 MED ORDER — ONDANSETRON 4 MG PO TBDP
4.0000 mg | ORAL_TABLET | Freq: Once | ORAL | Status: AC
  Administered 2020-04-15: 4 mg via ORAL
  Filled 2020-04-15: qty 1

## 2020-04-15 MED ORDER — PREDNISONE 10 MG PO TABS: 20.0000 mg | ORAL_TABLET | Freq: Every day | ORAL | 0 refills | Status: DC

## 2020-04-15 MED ORDER — OXYCODONE-ACETAMINOPHEN 5-325 MG PO TABS: 1.0000 | ORAL_TABLET | Freq: Four times a day (QID) | ORAL | 0 refills | Status: DC | PRN

## 2020-04-15 MED ORDER — HYDROMORPHONE HCL 1 MG/ML IJ SOLN
1.0000 mg | Freq: Once | INTRAMUSCULAR | Status: AC
  Administered 2020-04-15: 1 mg via INTRAMUSCULAR
  Filled 2020-04-15: qty 1

## 2020-04-15 NOTE — ED Notes (Signed)
This RN to speak with patient and family at bedside regarding discharge education. Sister at bedside reports that patient lives with her, and is primarily wheelchair bound. Sister states "I don't know who I would get her home and up the steps. She doesn't walk, and I don't walk well."  This RN addressed patient's sister concerns at this time.  Pt provided with an emesis bag due to sudden onset nausea and saltine crackers.

## 2020-04-15 NOTE — ED Notes (Signed)
This RN to bedside and sister request. Found patient to have had an episode of emesis of 228mL, and sister states "How am I suppose to get her home." This RN re-educated on current plan of care and discharge plan. All questions and concerns voiced addressed at this time.

## 2020-04-15 NOTE — ED Notes (Signed)
Pt's sister, Raeanne Barry, requests to be contacted at [phone] when transpiration has been arranged.

## 2020-04-15 NOTE — ED Triage Notes (Signed)
Pt presents to ED with complaints of left leg pain from thigh to knee. Pt states she fell 1 month ago.

## 2020-04-15 NOTE — ED Provider Notes (Signed)
[facility] EMERGENCY DEPARTMENT Provider Note   CSN: [id number] Arrival date & time: 04/15/20  1611     History Chief Complaint  Patient presents with  . Leg Pain    Theresa Garcia is a 80 y.o. female.  Patient complaining of left knee pain.  Patient states she fell on it a number days ago  The history is provided by the patient and medical records. No language interpreter was used.  Leg Pain Location:  Leg and knee Injury: no   Leg location:  L leg Pain details:    Quality:  Aching   Radiates to:  Does not radiate   Severity:  Moderate   Onset quality:  Sudden   Timing:  Constant   Progression:  Worsening Chronicity:  New Associated symptoms: no back pain and no fatigue        Past Medical History:  Diagnosis Date  . Breast cancer ([place]) 02/24/2000  . Bronchitis   . Cancer ([place])   . Chronic back pain   . Depression   . GERD (gastroesophageal reflux disease)   . Hyperlipidemia   . Hypertension   . Insomnia   . Kidney stone   . Personal history of chemotherapy   . Personal history of radiation therapy   . Pneumonia   . Vertigo     Patient Active Problem List   Diagnosis Date Noted  . Left knee pain 03/25/2020  . Localized swelling, mass or lump of neck 02/26/2020  . Reduced vision 09/29/2019  . Hip pain 09/25/2019  . Obesity (BMI 30.0-34.9) 08/08/2017  . Fatigue 05/31/2016  . Unsteady gait 05/31/2016  . GERD (gastroesophageal reflux disease) 04/27/2015  . Back pain with radiation 01/29/2015  . Depression with anxiety 11/27/2013  . Type 2 HSV infection of vulvovaginal region 03/12/2012  . Back pain 02/02/2011  . History of breast cancer in female 01/13/2010  . Hyperlipidemia 01/13/2010  . Essential hypertension 01/13/2010  . Insomnia 01/13/2010    Past Surgical History:  Procedure Laterality Date  . ABDOMINAL HYSTERECTOMY    . BREAST LUMPECTOMY Left 2002  . BREAST SURGERY     left  . CYSTOSCOPY/RETROGRADE/URETEROSCOPY/STONE EXTRACTION  WITH BASKET  01/07/2011   Procedure: CYSTOSCOPY/RETROGRADE/URETEROSCOPY/STONE EXTRACTION WITH BASKET;  Surgeon: Marissa Nestle;  Location: [facility];  Service: Urology;  Laterality: Left;  Balloon Dilatation; stone given to family per MD  . TUBAL LIGATION       OB History   No obstetric history on file.     Family History  Problem Relation Age of Onset  . COPD Father   . COPD Sister   . COPD Sister   . Diabetes Sister   . Diabetes Brother   . Seizures Mother   . Bone cancer Brother   . Heart disease Brother   . Aneurysm Brother        brain  . HIV/AIDS Brother     Social History   Tobacco Use  . Smoking status: Former Smoker    Packs/day: 1.00    Years: 5.00    Pack years: 5.00    Types: Cigarettes    Quit date: 09/07/1980    Years since quitting: 39.6  . Smokeless tobacco: Never Used  . Tobacco comment: quit 25+ years ago  Substance Use Topics  . Alcohol use: No    Alcohol/week: 0.0 standard drinks  . Drug use: No    Home Medications Prior to Admission medications   Medication Sig Start Date End Date Taking?  Authorizing Provider  acetaminophen (TYLENOL) 500 MG tablet Take 1,000 mg by mouth every 6 (six) hours as needed for mild pain or moderate pain.     [provider]  acyclovir (ZOVIRAX) 400 MG tablet Take 1 tablet by mouth twice daily 02/05/20   Fayrene Helper, MD  alendronate (FOSAMAX) 70 MG tablet Take 1 tablet (70 mg total) by mouth every 7 (seven) days. Take with a full glass of water on an empty stomach. 12/18/19   Fayrene Helper, MD  amLODipine-benazepril (LOTREL) 5-40 MG capsule TAKE 1 CAPSULE DAILY 03/22/20   Fayrene Helper, MD  apixaban (ELIQUIS) 5 MG TABS tablet Take 1 tablet (5 mg total) by mouth 2 (two) times daily. 02/26/20   Fayrene Helper, MD  atorvastatin (LIPITOR) 20 MG tablet TAKE 1 TABLET EVERY EVENING 06/21/19   Fayrene Helper, MD  calcium-vitamin D (OSCAL-500) 500-400 MG-UNIT tablet Take 1 tablet by mouth 2  (two) times daily. 09/25/19   Fayrene Helper, MD  ergocalciferol (VITAMIN D2) 1.25 MG (50000 UT) capsule Take 1 capsule (50,000 Units total) by mouth once a week. One capsule once weekly 12/18/19   Fayrene Helper, MD  FLUoxetine (PROZAC) 10 MG capsule Take 1 capsule by mouth once daily 02/05/20   Fayrene Helper, MD  methocarbamol (ROBAXIN) 500 MG tablet Take 1 tablet (500 mg total) by mouth every 8 (eight) hours as needed for muscle spasms. 10/16/19   Carole Civil, MD  Misc. Devices ([place]) MISC 1 each by Does not apply route daily as needed. 04/13/17   Raylene Everts, MD  oxyCODONE-acetaminophen (PERCOCET) 5-325 MG tablet Take 1 tablet by mouth every 6 (six) hours as needed. 04/15/20   Milton Ferguson, MD  predniSONE (DELTASONE) 10 MG tablet Take 2 tablets (20 mg total) by mouth daily. 04/15/20   Milton Ferguson, MD  Respiratory Therapy Supplies (AIRS DISPOSABLE NEBULIZER) KIT 1 Units by Does not apply route daily as needed. 04/13/17   Raylene Everts, MD  temazepam (RESTORIL) 30 MG capsule Take 1 capsule (30 mg total) by mouth at bedtime as needed for sleep. 11/10/19   Fayrene Helper, MD  vitamin C (ASCORBIC ACID) 500 MG tablet Take 500 mg by mouth daily.    [provider]    Allergies    Meclizine and Penicillins  Review of Systems   Review of Systems  Constitutional: Negative for appetite change and fatigue.  HENT: Negative for congestion, ear discharge and sinus pressure.   Eyes: Negative for discharge.  Respiratory: Negative for cough.   Cardiovascular: Negative for chest pain.  Gastrointestinal: Negative for abdominal pain and diarrhea.  Genitourinary: Negative for frequency and hematuria.  Musculoskeletal: Negative for back pain.       Left knee pain  Skin: Negative for rash.  Neurological: Negative for seizures and headaches.  Psychiatric/Behavioral: Negative for hallucinations.    Physical Exam Updated Vital Signs BP (!)  143/84 (BP Location: Left Arm)   Pulse 76   Temp 98.1 F (36.7 C) (Oral)   Resp 18   Ht $R'5\' 7"'cX$  (1.702 m)   Wt 90.3 kg   SpO2 100%   BMI 31.17 kg/m   Physical Exam Vitals and nursing note reviewed.  Constitutional:      Appearance: She is well-developed.  HENT:     Head: Normocephalic.     Nose: Nose normal.  Eyes:     Conjunctiva/sclera: Conjunctivae normal.  Neck:     Trachea:  No tracheal deviation.  Cardiovascular:     Rate and Rhythm: Normal rate.  Pulmonary:     Effort: Pulmonary effort is normal.  Musculoskeletal:     Comments: Mild swelling and tenderness to left knee  Skin:    General: Skin is warm.  Neurological:     Mental Status: She is alert and oriented to person, place, and time.  Psychiatric:        Mood and Affect: Mood and affect normal.     ED Results / Procedures / Treatments   Labs (all labs ordered are listed, but only abnormal results are displayed) Labs Reviewed - No data to display  EKG None  Radiology DG Knee Complete 4 Views Left  Result Date: 04/15/2020 CLINICAL DATA:  Fall 1 month ago.  Knee pain EXAM: LEFT KNEE - COMPLETE 4+ VIEW COMPARISON:  03/26/2020 FINDINGS: Negative for fracture.  Small joint effusion. Mild joint space narrowing in the medial joint space. Mild medial and lateral joint space spurring. IMPRESSION: Mild degenerative change.  Small effusion.  Negative for fracture. Electronically Signed   By: Franchot Gallo M.D.   On: 04/15/2020 17:04    Procedures Procedures   Medications Ordered in ED Medications  HYDROmorphone (DILAUDID) injection 1 mg (1 mg Intramuscular Given 04/15/20 1715)    ED Course  I have reviewed the triage vital signs and the nursing notes.  Pertinent labs & imaging results that were available during my care of the patient were reviewed by me and considered in my medical decision making (see chart for details).    MDM Rules/Calculators/A&P                          Patient with severe  arthritis left knee.  She is given Percocet and prednisone follow-up with orthopedic Final Clinical Impression(s) / ED Diagnoses Final diagnoses:  Acute pain of left knee    Rx / DC Orders ED Discharge Orders         Ordered    predniSONE (DELTASONE) 10 MG tablet  Daily,   Status:  Discontinued        04/15/20 1744    oxyCODONE-acetaminophen (PERCOCET) 5-325 MG tablet  Every 6 hours PRN,   Status:  Discontinued        04/15/20 1744    oxyCODONE-acetaminophen (PERCOCET) 5-325 MG tablet  Every 6 hours PRN        04/15/20 1745    predniSONE (DELTASONE) 10 MG tablet  Daily        04/15/20 1745           Milton Ferguson, MD 04/16/20 1209

## 2020-04-15 NOTE — Discharge Instructions (Addendum)
Stay off your leg is much as possible and follow-up with Dr. Aline Brochure this week or beginning next week

## 2020-04-15 NOTE — Telephone Encounter (Signed)
There are results in her Chart Review from [facility] ?

## 2020-04-15 NOTE — Telephone Encounter (Signed)
Pt sister states they live together, pt sister is elderly as well and she is currently bedridden due to pain and unable to walk. Sister cannot lift her and she is using the bed pan as well. Pt sister would like home health order and physical therapy to come in.

## 2020-04-15 NOTE — ED Notes (Signed)
X-Ray at bedside.

## 2020-04-15 NOTE — Telephone Encounter (Signed)
Pt  sister informed

## 2020-04-15 NOTE — ED Notes (Signed)

## 2020-04-15 NOTE — Telephone Encounter (Signed)
Insurance generally requires a face-to-face for [facility] orders. I'd recommend she go to the ED for an evaluation and let them know that she is unable to care for herself. If she gets admitted, then she can get PT started while admitted.

## 2020-04-15 NOTE — ED Notes (Signed)
Unit Secretary made aware of transportation request. Will provide updates when they become available.

## 2020-04-15 NOTE — Telephone Encounter (Signed)
Theresa Garcia is calling she states that [place] had her xrays at [facility] at the end of Jan. I advised we had not received anything from them.   I sent a fax to [facility] to retrieve the xrays

## 2020-04-15 NOTE — ED Notes (Signed)
With assistance from staff, patient rolled and removed from bed pan. Pt changed into clean sheets, gown and chuck pad at this time. Pt reposition in bed at patient request. All questions and concerns voiced addressed.

## 2020-04-15 NOTE — Telephone Encounter (Signed)
Patient called to see knee xray results came back. Please contact patient # [phone]

## 2020-04-16 ENCOUNTER — Telehealth: Payer: Self-pay

## 2020-04-16 ENCOUNTER — Other Ambulatory Visit: Payer: Self-pay

## 2020-04-16 DIAGNOSIS — M25562 Pain in left knee: Secondary | ICD-10-CM

## 2020-04-16 DIAGNOSIS — R2681 Unsteadiness on feet: Secondary | ICD-10-CM

## 2020-04-16 NOTE — Telephone Encounter (Signed)
Ok I spoke with patients sister and she is gonna try to have her here 04/18/20 @ 2:40 with gray

## 2020-04-16 NOTE — Telephone Encounter (Signed)
Patients sister, Arnettie called and requested home health physical therapy for patient because she is unable to get around and arnettie is unable to help her. Donneta Romberg suggested ER because of not being able to order the therapy without a face to face.   She was seen at ER yesterday and given pain med but they did not order PT. Sister called back and and wants [place] to order it but there HAS to be a face to face visit. Previous visits were done by telephone and that is not accepted by insurance for home health. Can you see if she can come in to see an available provider (if maybe they can get family members to help get her here?) I checked and without a face to face there is nothing I can do to get them physical therapy at home and I doubt they would understand how to do a video visit

## 2020-04-16 NOTE — Telephone Encounter (Signed)
Noted  

## 2020-04-18 ENCOUNTER — Other Ambulatory Visit: Payer: Self-pay

## 2020-04-18 ENCOUNTER — Ambulatory Visit: Payer: Medicare HMO | Admitting: Nurse Practitioner

## 2020-04-18 ENCOUNTER — Encounter: Payer: Self-pay | Admitting: Nurse Practitioner

## 2020-04-18 VITALS — BP 124/70 | HR 86 | Temp 98.0°F | Resp 20 | Ht 67.0 in | Wt 193.0 lb

## 2020-04-18 DIAGNOSIS — M25562 Pain in left knee: Secondary | ICD-10-CM | POA: Diagnosis not present

## 2020-04-18 DIAGNOSIS — M1712 Unilateral primary osteoarthritis, left knee: Secondary | ICD-10-CM | POA: Diagnosis not present

## 2020-04-18 NOTE — Assessment & Plan Note (Signed)
-  she would like rollator ordered, will honor that request -she has upcoming appt with ortho -has pending paperwork for home health

## 2020-04-18 NOTE — Progress Notes (Signed)
Acute Office Visit  Subjective:    Patient ID: Theresa Garcia, female    DOB: 1941-01-07, 80 y.o.   MRN: [id number]  Chief Complaint  Patient presents with  . Leg Pain    L knee pain x 3 weeks, recent ED evaluation for severe pain.     HPI Patient is in today for leg pain. She was seen in the ED on 04/15/20 after being seen in our office on 03/25/20 for left knee pain.  The ED prescribed her percocet and prednisone. She had x-ray on 03/26/20 that showed "Mild degenerative change.  Small effusion.  Negative for fracture."  She states she had a steroid injection in her arm to treat her knee, and she states she does not want a knee injection.  She rates her pain at a 8/10 and is throbbing and aching. She uses a walker and she is requesting a rollator walker.  She states that she is not able to walk around her house due to her knee pain.  Past Medical History:  Diagnosis Date  . Breast cancer ([place]) 02/24/2000  . Bronchitis   . Cancer ([place])   . Chronic back pain   . Depression   . GERD (gastroesophageal reflux disease)   . Hyperlipidemia   . Hypertension   . Insomnia   . Kidney stone   . Personal history of chemotherapy   . Personal history of radiation therapy   . Pneumonia   . Vertigo     Past Surgical History:  Procedure Laterality Date  . ABDOMINAL HYSTERECTOMY    . BREAST LUMPECTOMY Left 2002  . BREAST SURGERY     left  . CYSTOSCOPY/RETROGRADE/URETEROSCOPY/STONE EXTRACTION WITH BASKET  01/07/2011   Procedure: CYSTOSCOPY/RETROGRADE/URETEROSCOPY/STONE EXTRACTION WITH BASKET;  Surgeon: Marissa Nestle;  Location: [facility];  Service: Urology;  Laterality: Left;  Balloon Dilatation; stone given to family per MD  . TUBAL LIGATION      Family History  Problem Relation Age of Onset  . COPD Father   . COPD Sister   . COPD Sister   . Diabetes Sister   . Diabetes Brother   . Seizures Mother   . Bone cancer Brother   . Heart disease Brother   . Aneurysm Brother         brain  . HIV/AIDS Brother     Social History   Socioeconomic History  . Marital status: Divorced    Spouse name: Not on file  . Number of children: 2  . Years of education: Not on file  . Highest education level: Not on file  Occupational History  . Not on file  Tobacco Use  . Smoking status: Former Smoker    Packs/day: 1.00    Years: 5.00    Pack years: 5.00    Types: Cigarettes    Quit date: 09/07/1980    Years since quitting: 39.6  . Smokeless tobacco: Never Used  . Tobacco comment: quit 25+ years ago  Substance and Sexual Activity  . Alcohol use: No    Alcohol/week: 0.0 standard drinks  . Drug use: No  . Sexual activity: Not Currently  Other Topics Concern  . Not on file  Social History Narrative  . Not on file   Social Determinants of Health   Financial Resource Strain: Low Risk   . Difficulty of Paying Living Expenses: Not hard at all  Food Insecurity: No Food Insecurity  . Worried About Charity fundraiser in the Last Year:  Never true  . Ran Out of Food in the Last Year: Never true  Transportation Needs: No Transportation Needs  . Lack of Transportation (Medical): No  . Lack of Transportation (Non-Medical): No  Physical Activity: Insufficiently Active  . Days of Exercise per Week: 1 day  . Minutes of Exercise per Session: 10 min  Stress: No Stress Concern Present  . Feeling of Stress : Not at all  Social Connections: Moderately Isolated  . Frequency of Communication with Friends and Family: More than three times a week  . Frequency of Social Gatherings with Friends and Family: More than three times a week  . Attends Religious Services: More than 4 times per year  . Active Member of Clubs or Organizations: No  . Attends Archivist Meetings: Never  . Marital Status: Divorced  Human resources officer Violence: Not At Risk  . Fear of Current or Ex-Partner: No  . Emotionally Abused: No  . Physically Abused: No  . Sexually Abused: No    Outpatient  Medications Prior to Visit  Medication Sig Dispense Refill  . acetaminophen (TYLENOL) 500 MG tablet Take 1,000 mg by mouth every 6 (six) hours as needed for mild pain or moderate pain.     Marland Kitchen acyclovir (ZOVIRAX) 400 MG tablet Take 1 tablet by mouth twice daily 180 tablet 0  . alendronate (FOSAMAX) 70 MG tablet Take 1 tablet (70 mg total) by mouth every 7 (seven) days. Take with a full glass of water on an empty stomach. 12 tablet 3  . amLODipine-benazepril (LOTREL) 5-40 MG capsule TAKE 1 CAPSULE DAILY 90 capsule 3  . apixaban (ELIQUIS) 5 MG TABS tablet Take 1 tablet (5 mg total) by mouth 2 (two) times daily. 180 tablet 1  . atorvastatin (LIPITOR) 20 MG tablet TAKE 1 TABLET EVERY EVENING 90 tablet 3  . calcium-vitamin D (OSCAL-500) 500-400 MG-UNIT tablet Take 1 tablet by mouth 2 (two) times daily. 180 tablet 3  . ergocalciferol (VITAMIN D2) 1.25 MG (50000 UT) capsule Take 1 capsule (50,000 Units total) by mouth once a week. One capsule once weekly 12 capsule 2  . FLUoxetine (PROZAC) 10 MG capsule Take 1 capsule by mouth once daily 90 capsule 0  . methocarbamol (ROBAXIN) 500 MG tablet Take 1 tablet (500 mg total) by mouth every 8 (eight) hours as needed for muscle spasms. 60 tablet 1  . Misc. Devices ([place]) MISC 1 each by Does not apply route daily as needed. 1 each 0  . oxyCODONE-acetaminophen (PERCOCET) 5-325 MG tablet Take 1 tablet by mouth every 6 (six) hours as needed. 20 tablet 0  . predniSONE (DELTASONE) 10 MG tablet Take 2 tablets (20 mg total) by mouth daily. 14 tablet 0  . Respiratory Therapy Supplies (AIRS DISPOSABLE NEBULIZER) KIT 1 Units by Does not apply route daily as needed. 1 each 0  . temazepam (RESTORIL) 30 MG capsule Take 1 capsule (30 mg total) by mouth at bedtime as needed for sleep. 30 capsule 5  . vitamin C (ASCORBIC ACID) 500 MG tablet Take 500 mg by mouth daily.     No facility-administered medications prior to visit.    Allergies  Allergen Reactions  .  Meclizine Diarrhea  . Penicillins Rash and Other (See Comments)    Has patient had a PCN reaction causing immediate rash, facial/tongue/throat swelling, SOB or lightheadedness with hypotension: no Has patient had a PCN reaction causing severe rash involving mucus membranes or skin necrosis: No Has patient had a PCN reaction  that required hospitalization No Has patient had a PCN reaction occurring within the last 10 years: No If all of the above answers are "NO", then may proceed with Cephalosporin use.     Review of Systems  Constitutional: Negative.   Respiratory: Negative.   Cardiovascular: Negative.   Musculoskeletal: Positive for arthralgias.       Left knee pain       Objective:    Physical Exam Constitutional:      Appearance: Normal appearance.  Musculoskeletal:        General: Tenderness present.     Comments: To left knee  Neurological:     Mental Status: She is alert.     BP 124/70   Pulse 86   Temp 98 F (36.7 C)   Resp 20   Ht 5' 7"  (1.702 m)   Wt 193 lb (87.5 kg)   SpO2 95%   BMI 30.23 kg/m  Wt Readings from Last 3 Encounters:  04/18/20 193 lb (87.5 kg)  04/15/20 199 lb (90.3 kg)  02/26/20 195 lb 6.4 oz (88.6 kg)    There are no preventive care reminders to display for this patient.  There are no preventive care reminders to display for this patient.   Lab Results  Component Value Date   TSH 0.942 03/21/2020   Lab Results  Component Value Date   WBC 7.3 03/21/2020   HGB 13.6 03/21/2020   HCT 40.2 03/21/2020   MCV 94 03/21/2020   PLT 277 03/21/2020   Lab Results  Component Value Date   NA 140 03/21/2020   K 4.3 03/21/2020   CO2 20 03/21/2020   GLUCOSE 92 03/21/2020   BUN 15 03/21/2020   CREATININE 0.79 03/21/2020   BILITOT 0.4 03/21/2020   ALKPHOS 59 03/21/2020   AST 15 03/21/2020   ALT 13 03/21/2020   PROT 7.0 03/21/2020   ALBUMIN 4.1 03/21/2020   CALCIUM 9.6 03/21/2020   ANIONGAP 10 04/01/2017   Lab Results  Component  Value Date   CHOL 141 03/21/2020   Lab Results  Component Value Date   HDL 55 03/21/2020   Lab Results  Component Value Date   LDLCALC 71 03/21/2020   Lab Results  Component Value Date   TRIG 79 03/21/2020   Lab Results  Component Value Date   CHOLHDL 2.6 03/21/2020   No results found for: HGBA1C     Assessment & Plan:   Problem List Items Addressed This Visit      Other   Left knee pain    -she would like rollator ordered, will honor that request -she has upcoming appt with ortho -has pending paperwork for home health      Relevant Orders   DME Walker platform    Other Visit Diagnoses    Osteoarthritis of left knee, unspecified osteoarthritis type    -  Primary   Relevant Orders   DME Walker platform       No orders of the defined types were placed in this encounter.    Noreene Larsson, NP

## 2020-04-18 NOTE — Patient Instructions (Signed)
I sent an order for a rollator walker to [employer]. It may take a few days to get all of the paperwork together for this.  Please follow-up with Dr. Aline Brochure for your knee pain.

## 2020-04-29 ENCOUNTER — Other Ambulatory Visit: Payer: Self-pay

## 2020-04-29 ENCOUNTER — Encounter: Payer: Self-pay | Admitting: Orthopedic Surgery

## 2020-04-29 ENCOUNTER — Ambulatory Visit: Payer: Medicare HMO | Admitting: Orthopedic Surgery

## 2020-04-29 VITALS — BP 107/90 | HR 96 | Ht 67.0 in | Wt 196.2 lb

## 2020-04-29 DIAGNOSIS — M25562 Pain in left knee: Secondary | ICD-10-CM

## 2020-04-29 DIAGNOSIS — M25462 Effusion, left knee: Secondary | ICD-10-CM | POA: Diagnosis not present

## 2020-04-29 NOTE — Progress Notes (Signed)
Chief Complaint  Patient presents with  . Knee Pain    L/having some pain, it was swollen when made appoinment   80 year old female on Eliquis had some pain in her left knee and swelling then fell out of a chair presented to the ER after she could not ambulate x-rays were negative except for some mild degenerative changes she has improved with Percocet for pain, a walker, and prednisone  Definitive diagnosis unclear  System review no fever no skin rashes or numbness of the leg Past Medical History:  Diagnosis Date  . Breast cancer ([place]) 02/24/2000  . Bronchitis   . Cancer ([place])   . Chronic back pain   . Depression   . GERD (gastroesophageal reflux disease)   . Hyperlipidemia   . Hypertension   . Insomnia   . Kidney stone   . Personal history of chemotherapy   . Personal history of radiation therapy   . Pneumonia   . Vertigo    No orders of the defined types were placed in this encounter.  BP 107/90   Pulse 96   Ht 5\' 7"  (1.702 m)   Wt 196 lb 4 oz (89 kg)   BMI 30.74 kg/m   She is awake and alert she is oriented she is with her sister who is the primary caregiver She carries in a cane Small effusion left knee normal straight leg raise Flexion arc 125 degrees No instability detected Mild diffuse tenderness No peripheral edema  Outside images 4 views of the knee no fracture dislocation small joint effusion  Differential diagnosis includes acute effusion from Eliquis versus acute arthritic effusion  Recommend ice 3 times a day Taper Percocet Continue with Tylenol for pain  Follow-up as needed  Encounter Diagnoses  Name Primary?  . Acute pain of left knee Yes  . Effusion, left knee     Outside images were obtained and reviewed.  4 views left knee April 15, 2020  No fracture seen mild degenerative changes noted with mild joint space narrowing nothing too significant  Assessment and plan  Possible acute bloody effusion left knee joint versus acute  arthritic effusion  Recommend ice Taper Percocet Use Tylenol for pain

## 2020-05-06 ENCOUNTER — Other Ambulatory Visit: Payer: Self-pay | Admitting: Family Medicine

## 2020-05-07 ENCOUNTER — Other Ambulatory Visit: Payer: Self-pay | Admitting: Family Medicine

## 2020-05-08 ENCOUNTER — Other Ambulatory Visit (HOSPITAL_COMMUNITY): Payer: Self-pay | Admitting: Family Medicine

## 2020-05-08 DIAGNOSIS — Z1231 Encounter for screening mammogram for malignant neoplasm of breast: Secondary | ICD-10-CM

## 2020-05-10 ENCOUNTER — Other Ambulatory Visit: Payer: Self-pay | Admitting: Family Medicine

## 2020-05-12 DIAGNOSIS — M549 Dorsalgia, unspecified: Secondary | ICD-10-CM

## 2020-05-12 DIAGNOSIS — M1712 Unilateral primary osteoarthritis, left knee: Secondary | ICD-10-CM

## 2020-05-12 DIAGNOSIS — E785 Hyperlipidemia, unspecified: Secondary | ICD-10-CM

## 2020-05-12 DIAGNOSIS — I10 Essential (primary) hypertension: Secondary | ICD-10-CM

## 2020-05-12 DIAGNOSIS — G8929 Other chronic pain: Secondary | ICD-10-CM

## 2020-05-12 DIAGNOSIS — F32A Depression, unspecified: Secondary | ICD-10-CM

## 2020-05-12 DIAGNOSIS — M25562 Pain in left knee: Secondary | ICD-10-CM

## 2020-05-12 DIAGNOSIS — R42 Dizziness and giddiness: Secondary | ICD-10-CM

## 2020-05-12 DIAGNOSIS — Z87891 Personal history of nicotine dependence: Secondary | ICD-10-CM

## 2020-05-12 DIAGNOSIS — G8911 Acute pain due to trauma: Secondary | ICD-10-CM

## 2020-06-17 ENCOUNTER — Other Ambulatory Visit: Payer: Self-pay | Admitting: Family Medicine

## 2020-07-01 ENCOUNTER — Other Ambulatory Visit: Payer: Self-pay | Admitting: Family Medicine

## 2020-07-01 DIAGNOSIS — R5383 Other fatigue: Secondary | ICD-10-CM

## 2020-07-01 DIAGNOSIS — R110 Nausea: Secondary | ICD-10-CM

## 2020-07-01 DIAGNOSIS — R1110 Vomiting, unspecified: Secondary | ICD-10-CM

## 2020-07-01 DIAGNOSIS — Z23 Encounter for immunization: Secondary | ICD-10-CM

## 2020-08-07 ENCOUNTER — Other Ambulatory Visit: Payer: Self-pay | Admitting: Family Medicine

## 2020-08-10 ENCOUNTER — Other Ambulatory Visit: Payer: Self-pay | Admitting: Family Medicine

## 2020-08-10 ENCOUNTER — Other Ambulatory Visit: Payer: Self-pay | Admitting: Nurse Practitioner

## 2020-08-10 DIAGNOSIS — R1110 Vomiting, unspecified: Secondary | ICD-10-CM

## 2020-08-10 DIAGNOSIS — Z23 Encounter for immunization: Secondary | ICD-10-CM

## 2020-08-10 DIAGNOSIS — R5383 Other fatigue: Secondary | ICD-10-CM

## 2020-08-10 DIAGNOSIS — R110 Nausea: Secondary | ICD-10-CM

## 2020-08-12 NOTE — Telephone Encounter (Signed)
I think she is one of yours.

## 2020-08-15 ENCOUNTER — Other Ambulatory Visit: Payer: Self-pay

## 2020-08-15 MED ORDER — TEMAZEPAM 30 MG PO CAPS: ORAL_CAPSULE | ORAL | 2 refills | Status: DC

## 2020-08-19 ENCOUNTER — Other Ambulatory Visit: Payer: Self-pay | Admitting: Family Medicine

## 2020-08-27 ENCOUNTER — Other Ambulatory Visit: Payer: Self-pay | Admitting: Family Medicine

## 2020-08-28 ENCOUNTER — Ambulatory Visit (HOSPITAL_COMMUNITY): Payer: Medicare HMO

## 2020-09-02 ENCOUNTER — Ambulatory Visit (HOSPITAL_COMMUNITY)
Admission: RE | Admit: 2020-09-02 | Discharge: 2020-09-02 | Disposition: A | Discharge status: Home / Self Care | Payer: Medicare HMO | Source: Ambulatory Visit | Attending: Family Medicine | Admitting: Family Medicine

## 2020-09-02 ENCOUNTER — Other Ambulatory Visit: Payer: Self-pay

## 2020-09-02 DIAGNOSIS — Z1231 Encounter for screening mammogram for malignant neoplasm of breast: Secondary | ICD-10-CM

## 2020-09-23 ENCOUNTER — Other Ambulatory Visit: Payer: Self-pay

## 2020-09-24 ENCOUNTER — Encounter: Payer: Medicare HMO | Admitting: Family Medicine

## 2020-09-30 ENCOUNTER — Other Ambulatory Visit: Payer: Self-pay

## 2020-10-03 ENCOUNTER — Ambulatory Visit (INDEPENDENT_AMBULATORY_CARE_PROVIDER_SITE_OTHER): Payer: Medicare HMO

## 2020-10-03 ENCOUNTER — Other Ambulatory Visit: Payer: Self-pay

## 2020-10-03 DIAGNOSIS — Z0000 Encounter for general adult medical examination without abnormal findings: Secondary | ICD-10-CM | POA: Diagnosis not present

## 2020-10-03 NOTE — Progress Notes (Signed)
Subjective:   Theresa Garcia is a 80 y.o. female who presents for Medicare Annual (Subsequent) preventive examination. I connected with  Deniece Portela on 10/03/20 by a audio enabled telemedicine application and verified that I am speaking with the correct person using two identifiers.   I discussed the limitations of evaluation and management by telemedicine. The patient expressed understanding and agreed to proceed.   Review of Systems    Defer to PCP       Objective:    There were no vitals filed for this visit. There is no height or weight on file to calculate BMI.  Advanced Directives 10/03/2020 04/15/2020 09/13/2017 04/05/2017 03/25/2017 03/19/2017 09/07/2016  Does Patient Have a Medical Advance Directive? Yes No No Yes Yes No No  Type of Advance Directive - - - (No Data) (No Data) - -  Does patient want to make changes to medical advance directive? - - - No - Patient declined No - Patient declined - -  Would patient like information on creating a medical advance directive? - - Yes (ED - Information included in AVS) No - Patient declined No - Patient declined No - Patient declined Yes (MAU/Ambulatory/Procedural Areas - Information given)  Pre-existing out of facility DNR order (yellow form or pink MOST form) - - - - - - -    Current Medications (verified) Outpatient Encounter Medications as of 10/03/2020  Medication Sig   acetaminophen (TYLENOL) 500 MG tablet Take 1,000 mg by mouth every 6 (six) hours as needed for mild pain or moderate pain.    acyclovir (ZOVIRAX) 400 MG tablet Take 1 tablet by mouth twice daily   alendronate (FOSAMAX) 70 MG tablet Take 1 tablet (70 mg total) by mouth every 7 (seven) days. Take with a full glass of water on an empty stomach.   amLODipine-benazepril (LOTREL) 5-40 MG capsule TAKE 1 CAPSULE DAILY   atorvastatin (LIPITOR) 20 MG tablet TAKE 1 TABLET EVERY EVENING   calcium-vitamin D (OSCAL-500) 500-400 MG-UNIT tablet Take 1 tablet by mouth 2 (two)  times daily.   ELIQUIS 5 MG TABS tablet Take 1 tablet by mouth twice daily   FLUoxetine (PROZAC) 10 MG capsule Take 1 capsule by mouth once daily   meclizine (ANTIVERT) 25 MG tablet TAKE 1 TABLET BY MOUTH THREE TIMES DAILY AS NEEDED FOR DIZZINESS   methocarbamol (ROBAXIN) 500 MG tablet Take 1 tablet (500 mg total) by mouth every 8 (eight) hours as needed for muscle spasms.   temazepam (RESTORIL) 30 MG capsule TAKE 1 CAPSULE BY MOUTH AT BEDTIME AS NEEDED FOR SLEEP   vitamin C (ASCORBIC ACID) 500 MG tablet Take 500 mg by mouth daily.   Vitamin D, Ergocalciferol, (DRISDOL) 1.25 MG (50000 UNIT) CAPS capsule Take 1 capsule by mouth once a week   Misc. Devices ([place]) MISC 1 each by Does not apply route daily as needed. (Patient not taking: Reported on 10/03/2020)   oxyCODONE-acetaminophen (PERCOCET) 5-325 MG tablet Take 1 tablet by mouth every 6 (six) hours as needed. (Patient not taking: Reported on 10/03/2020)   predniSONE (DELTASONE) 10 MG tablet Take 2 tablets (20 mg total) by mouth daily. (Patient not taking: No sig reported)   Respiratory Therapy Supplies (AIRS DISPOSABLE NEBULIZER) KIT 1 Units by Does not apply route daily as needed. (Patient not taking: Reported on 10/03/2020)   No facility-administered encounter medications on file as of 10/03/2020.    Allergies (verified) Meclizine and Penicillins   History: Past Medical History:  Diagnosis  Date   Breast cancer ([place]) 02/24/2000   Bronchitis    Cancer ([place])    Chronic back pain    Depression    GERD (gastroesophageal reflux disease)    Hyperlipidemia    Hypertension    Insomnia    Kidney stone    Personal history of chemotherapy    Personal history of radiation therapy    Pneumonia    Vertigo    Past Surgical History:  Procedure Laterality Date   ABDOMINAL HYSTERECTOMY     BREAST LUMPECTOMY Left 2002   BREAST SURGERY     left   CYSTOSCOPY/RETROGRADE/URETEROSCOPY/STONE EXTRACTION WITH BASKET  01/07/2011    Procedure: CYSTOSCOPY/RETROGRADE/URETEROSCOPY/STONE EXTRACTION WITH BASKET;  Surgeon: Marissa Nestle;  Location: [facility];  Service: Urology;  Laterality: Left;  Balloon Dilatation; stone given to family per MD   TUBAL LIGATION     Family History  Problem Relation Age of Onset   COPD Father    COPD Sister    COPD Sister    Diabetes Sister    Diabetes Brother    Seizures Mother    Bone cancer Brother    Heart disease Brother    Aneurysm Brother        brain   HIV/AIDS Brother    Social History   Socioeconomic History   Marital status: Divorced    Spouse name: Not on file   Number of children: 2   Years of education: Not on file   Highest education level: Not on file  Occupational History   Not on file  Tobacco Use   Smoking status: Former    Packs/day: 1.00    Years: 5.00    Pack years: 5.00    Types: Cigarettes    Quit date: 09/07/1980    Years since quitting: 40.0   Smokeless tobacco: Never   Tobacco comments:    quit 25+ years ago  Substance and Sexual Activity   Alcohol use: No    Alcohol/week: 0.0 standard drinks   Drug use: No   Sexual activity: Not Currently  Other Topics Concern   Not on file  Social History Narrative   Not on file   Social Determinants of Health   Financial Resource Strain: Low Risk    Difficulty of Paying Living Expenses: Not hard at all  Food Insecurity: No Food Insecurity   Worried About Charity fundraiser in the Last Year: Never true   Ran Out of Food in the Last Year: Never true  Transportation Needs: No Transportation Needs   Lack of Transportation (Medical): No   Lack of Transportation (Non-Medical): No  Physical Activity: Inactive   Days of Exercise per Week: 0 days   Minutes of Exercise per Session: 0 min  Stress: No Stress Concern Present   Feeling of Stress : Not at all  Social Connections: Moderately Isolated   Frequency of Communication with Friends and Family: More than three times a week   Frequency of Social  Gatherings with Friends and Family: Twice a week   Attends Religious Services: More than 4 times per year   Active Member of [employer] or Organizations: No   Attends Music therapist: Never   Marital Status: Divorced    Tobacco Counseling Counseling given: Not Answered Tobacco comments: quit 25+ years ago   Clinical Intake:  Pre-visit preparation completed: Yes  Pain : No/denies pain     Nutritional Risks: None Diabetes: No  How often do you need to have  someone help you when you read instructions, pamphlets, or other written materials from your doctor or pharmacy?: 1 - Never  Diabetic?No  Interpreter Needed?: No  Information entered by :: Johna Sheriff, CMA   Activities of Daily Living In your present state of health, do you have any difficulty performing the following activities: 10/03/2020  Hearing? N  Vision? N  Difficulty concentrating or making decisions? N  Walking or climbing stairs? N  Dressing or bathing? N  Doing errands, shopping? Y  Comment sister assist with appointments  Preparing Food and eating ? N  Using the Toilet? N  In the past six months, have you accidently leaked urine? N  Do you have problems with loss of bowel control? Y  Comment sometimes  Managing your Medications? N  Comment sister assist  Managing your Finances? N  Housekeeping or managing your Housekeeping? N  Some recent data might be hidden    Patient Care Team: Fayrene Helper, MD as PCP - General  Indicate any recent Medical Services you may have received from other than [facility] providers in the past year (date may be approximate).     Assessment:   This is a routine wellness examination for Theresa Garcia.  Hearing/Vision screen No results found.  Dietary issues and exercise activities discussed: Current Exercise Habits: The patient does not participate in regular exercise at present   Goals Addressed   None    Depression Screen PHQ 2/9 Scores 10/03/2020 04/18/2020  03/25/2020 09/25/2019 09/25/2019 09/21/2019 03/30/2019  PHQ - 2 Score 0 0 0 0 0 0 0  PHQ- 9 Score - - - - - 0 0    Fall Risk Fall Risk  10/03/2020 04/18/2020 03/25/2020 02/26/2020 09/25/2019  Falls in the past year? 0 1 1 0 0  Number falls in past yr: 0 1 0 0 -  Injury with Fall? 0 1 1 0 -  Risk for fall due to : Impaired balance/gait Impaired balance/gait;Impaired mobility Impaired balance/gait - -  Follow up Falls evaluation completed Falls evaluation completed Falls evaluation completed - -    FALL RISK PREVENTION PERTAINING TO THE HOME:  Any stairs in or around the home? Yes  If so, are there any without handrails? No  Home free of loose throw rugs in walkways, pet beds, electrical cords, etc? Yes  Adequate lighting in your home to reduce risk of falls? Yes   ASSISTIVE DEVICES UTILIZED TO PREVENT FALLS:  Life alert? No  Use of a cane, walker or w/c? Yes  Grab bars in the bathroom? Yes  Shower chair or bench in shower? Yes  Elevated toilet seat or a handicapped toilet? Yes   TIMED UP AND GO:  Was the test performed?  N/A .  Length of time to ambulate 10 feet: N/A sec.     Cognitive Function: MMSE - Mini Mental State Exam 05/27/2016 07/24/2015  Orientation to time 5 5  Orientation to Place 5 5  Registration 3 3  Attention/ Calculation 5 5  Recall 3 2  Language- name 2 objects 2 2  Language- repeat 1 1  Language- follow 3 step command 3 3  Language- read & follow direction 1 1  Write a sentence 1 1  Copy design 1 1  Total score 30 29     6CIT Screen 10/03/2020 09/21/2019 09/20/2018 09/13/2017 09/07/2016  What Year? 0 points - 0 points 0 points 0 points  What month? 0 points 0 points 0 points 0 points 0 points  What  time? 0 points 0 points 0 points 0 points 0 points  Count back from 20 0 points 0 points 0 points 0 points 0 points  Months in reverse 0 points 0 points 0 points 0 points 0 points  Repeat phrase 0 points 0 points 2 points 0 points 0 points  Total Score 0 - 2 0 0     Immunizations Immunization History  Administered Date(s) Administered   Fluad Quad(high Dose 65+) 11/07/2018, 11/03/2019   Influenza Split 12/18/2010   Influenza Whole 01/13/2010   Influenza,inj,Quad PF,6+ Mos 11/28/2012, 11/27/2013, 12/17/2014, 12/30/2015, 11/30/2016   Influenza-Unspecified 12/24/2017   Moderna Sars-Covid-2 Vaccination 03/31/2019, 04/29/2019, 12/18/2019   Pneumococcal Conjugate-13 03/14/2014   Pneumococcal Polysaccharide-23 10/10/2012    TDAP status: Due, Education has been provided regarding the importance of this vaccine. Advised may receive this vaccine at local pharmacy or Health Dept. Aware to provide a copy of the vaccination record if obtained from local pharmacy or Health Dept. Verbalized acceptance and understanding.  Flu Vaccine status: Due, Education has been provided regarding the importance of this vaccine. Advised may receive this vaccine at local pharmacy or Health Dept. Aware to provide a copy of the vaccination record if obtained from local pharmacy or Health Dept. Verbalized acceptance and understanding.  Pneumococcal vaccine status: Up to date  Covid-19 vaccine status: Completed vaccines  Qualifies for Shingles Vaccine? Yes   Zostavax completed No   Shingrix Completed?: No.    Education has been provided regarding the importance of this vaccine. Patient has been advised to call insurance company to determine out of pocket expense if they have not yet received this vaccine. Advised may also receive vaccine at local pharmacy or Health Dept. Verbalized acceptance and understanding.  Screening Tests Health Maintenance  Topic Date Due   Zoster Vaccines- Shingrix (1 of 2) Never done   COVID-19 Vaccine (4 - Booster for Moderna series) 04/19/2020   INFLUENZA VACCINE  09/23/2020   TETANUS/TDAP  10/17/2020 (Originally 02/21/1960)   DEXA SCAN  Completed   Hepatitis C Screening  Completed   PNA vac Low Risk Adult  Completed   HPV VACCINES  Aged Out     Health Maintenance  Health Maintenance Due  Topic Date Due   Zoster Vaccines- Shingrix (1 of 2) Never done   COVID-19 Vaccine (4 - Booster for Moderna series) 04/19/2020   INFLUENZA VACCINE  09/23/2020    Colorectal cancer screening: No longer required.   Mammogram status: Completed 09/02/2020. Repeat every year  Bone Density status: Completed 08/23/2019. Results reflect: Bone density results: NORMAL. Repeat every 5 years.  Lung Cancer Screening: (Low Dose CT Chest recommended if Age 59-80 years, 30 pack-year currently smoking OR have quit w/in 15years.) does not qualify.   Lung Cancer Screening Referral: No  Additional Screening:  Hepatitis C Screening: does not qualify  Vision Screening: Recommended annual ophthalmology exams for early detection of glaucoma and other disorders of the eye. Is the patient up to date with their annual eye exam?  No  Who is the provider or what is the name of the office in which the patient attends annual eye exams? [facility] If pt is not established with a provider, would they like to be referred to a provider to establish care?  N/A .   Dental Screening: Recommended annual dental exams for proper oral hygiene  Community Resource Referral / Chronic Care Management: CRR required this visit?  No   CCM required this visit?  No  Plan:     I have personally reviewed and noted the following in the patient's chart:   Medical and social history Use of alcohol, tobacco or illicit drugs  Current medications and supplements including opioid prescriptions.  Functional ability and status Nutritional status Physical activity Advanced directives List of other physicians Hospitalizations, surgeries, and ER visits in previous 12 months Vitals Screenings to include cognitive, depression, and falls Referrals and appointments  In addition, I have reviewed and discussed with patient certain preventive protocols, quality metrics, and best  practice recommendations. A written personalized care plan for preventive services as well as general preventive health recommendations were provided to patient.     Edgar Frisk, [place]   10/03/2020   Nurse Notes: Non face to Face Completed 30 minutes  Theresa Garcia , Thank you for taking time to come for your Medicare Wellness Visit. I appreciate your ongoing commitment to your health goals. Please review the following plan we discussed and let me know if I can assist you in the future.   These are the goals we discussed:  Goals      Exercise 3x per week (30 min per time)     Recommend starting a routine exercise program at least 3 days a week for 30-45 minutes at a time as tolerated.         This is a list of the screening recommended for you and due dates:  Health Maintenance  Topic Date Due   Zoster (Shingles) Vaccine (1 of 2) Never done   COVID-19 Vaccine (4 - Booster for Moderna series) 04/19/2020   Flu Shot  09/23/2020   Tetanus Vaccine  10/17/2020*   DEXA scan (bone density measurement)  Completed   Hepatitis C Screening: USPSTF Recommendation to screen - Ages 20-79 yo.  Completed   Pneumonia vaccines  Completed   HPV Vaccine  Aged Out  *Topic was postponed. The date shown is not the original due date.

## 2020-10-03 NOTE — Patient Instructions (Signed)
Health Maintenance, Female Adopting a healthy lifestyle and getting preventive care are important in promoting health and wellness. Ask your health care provider about: The right schedule for you to have regular tests and exams. Things you can do on your own to prevent diseases and keep yourself healthy. What should I know about diet, weight, and exercise? Eat a healthy diet  Eat a diet that includes plenty of vegetables, fruits, low-fat dairy products, and lean protein. Do not eat a lot of foods that are high in solid fats, added sugars, or sodium.  Maintain a healthy weight Body mass index (BMI) is used to identify weight problems. It estimates body fat based on height and weight. Your health care provider can help determineyour BMI and help you achieve or maintain a healthy weight. Get regular exercise Get regular exercise. This is one of the most important things you can do for your health. Most adults should: Exercise for at least 150 minutes each week. The exercise should increase your heart rate and make you sweat (moderate-intensity exercise). Do strengthening exercises at least twice a week. This is in addition to the moderate-intensity exercise. Spend less time sitting. Even light physical activity can be beneficial. Watch cholesterol and blood lipids Have your blood tested for lipids and cholesterol at 78 years of age, then havethis test every 5 years. Have your cholesterol levels checked more often if: Your lipid or cholesterol levels are high. You are older than 78 years of age. You are at high risk for heart disease. What should I know about cancer screening? Depending on your health history and family history, you may need to have cancer screening at various ages. This may include screening for: Breast cancer. Cervical cancer. Colorectal cancer. Skin cancer. Lung cancer. What should I know about heart disease, diabetes, and high blood pressure? Blood pressure and heart  disease High blood pressure causes heart disease and increases the risk of stroke. This is more likely to develop in people who have high blood pressure readings, are of African descent, or are overweight. Have your blood pressure checked: Every 3-5 years if you are 18-39 years of age. Every year if you are 40 years old or older. Diabetes Have regular diabetes screenings. This checks your fasting blood sugar level. Have the screening done: Once every three years after age 40 if you are at a normal weight and have a low risk for diabetes. More often and at a younger age if you are overweight or have a high risk for diabetes. What should I know about preventing infection? Hepatitis B If you have a higher risk for hepatitis B, you should be screened for this virus. Talk with your health care provider to find out if you are at risk forhepatitis B infection. Hepatitis C Testing is recommended for: Everyone born from 1945 through 1965. Anyone with known risk factors for hepatitis C. Sexually transmitted infections (STIs) Get screened for STIs, including gonorrhea and chlamydia, if: You are sexually active and are younger than 78 years of age. You are older than 78 years of age and your health care provider tells you that you are at risk for this type of infection. Your sexual activity has changed since you were last screened, and you are at increased risk for chlamydia or gonorrhea. Ask your health care provider if you are at risk. Ask your health care provider about whether you are at high risk for HIV. Your health care provider may recommend a prescription medicine to help   prevent HIV infection. If you choose to take medicine to prevent HIV, you should first get tested for HIV. You should then be tested every 3 months for as long as you are taking the medicine. Pregnancy If you are about to stop having your period (premenopausal) and you may become pregnant, seek counseling before you get  pregnant. Take 400 to 800 micrograms (mcg) of folic acid every day if you become pregnant. Ask for birth control (contraception) if you want to prevent pregnancy. Osteoporosis and menopause Osteoporosis is a disease in which the bones lose minerals and strength with aging. This can result in bone fractures. If you are 65 years old or older, or if you are at risk for osteoporosis and fractures, ask your health care provider if you should: Be screened for bone loss. Take a calcium or vitamin D supplement to lower your risk of fractures. Be given hormone replacement therapy (HRT) to treat symptoms of menopause. Follow these instructions at home: Lifestyle Do not use any products that contain nicotine or tobacco, such as cigarettes, e-cigarettes, and chewing tobacco. If you need help quitting, ask your health care provider. Do not use street drugs. Do not share needles. Ask your health care provider for help if you need support or information about quitting drugs. Alcohol use Do not drink alcohol if: Your health care provider tells you not to drink. You are pregnant, may be pregnant, or are planning to become pregnant. If you drink alcohol: Limit how much you use to 0-1 drink a day. Limit intake if you are breastfeeding. Be aware of how much alcohol is in your drink. In the [place] one drink equals one 12 oz bottle of beer (355 mL), one 5 oz glass of wine (148 mL), or one 1 oz glass of hard liquor (44 mL). General instructions Schedule regular health, dental, and eye exams. Stay current with your vaccines. Tell your health care provider if: You often feel depressed. You have ever been abused or do not feel safe at home. Summary Adopting a healthy lifestyle and getting preventive care are important in promoting health and wellness. Follow your health care provider's instructions about healthy diet, exercising, and getting tested or screened for diseases. Follow your health care provider's  instructions on monitoring your cholesterol and blood pressure. This information is not intended to replace advice given to you by your health care provider. Make sure you discuss any questions you have with your healthcare provider. Document Revised: 02/02/2018 Document Reviewed: 02/02/2018 Elsevier Patient Education  2022 Elsevier Inc.  

## 2020-10-14 ENCOUNTER — Other Ambulatory Visit: Payer: Self-pay | Admitting: Family Medicine

## 2020-10-14 DIAGNOSIS — R110 Nausea: Secondary | ICD-10-CM

## 2020-10-14 DIAGNOSIS — R1110 Vomiting, unspecified: Secondary | ICD-10-CM

## 2020-10-14 DIAGNOSIS — R5383 Other fatigue: Secondary | ICD-10-CM

## 2020-10-14 DIAGNOSIS — Z23 Encounter for immunization: Secondary | ICD-10-CM

## 2020-10-28 ENCOUNTER — Other Ambulatory Visit: Payer: Self-pay | Admitting: Family Medicine

## 2020-11-05 ENCOUNTER — Other Ambulatory Visit: Payer: Self-pay | Admitting: Family Medicine

## 2020-11-07 NOTE — Progress Notes (Signed)
Addendum added to document patient and provider location  Location of patient:Home   Location of Provider:Office  Persons participating in virtual visit: Theresa Garcia (patient) and Edgar Frisk.CMA

## 2020-11-09 ENCOUNTER — Other Ambulatory Visit: Payer: Self-pay | Admitting: Nurse Practitioner

## 2020-11-09 ENCOUNTER — Other Ambulatory Visit: Payer: Self-pay | Admitting: Family Medicine

## 2020-11-11 NOTE — Telephone Encounter (Signed)
Do you want her to get a refill on this?

## 2020-11-13 ENCOUNTER — Other Ambulatory Visit: Payer: Self-pay

## 2020-11-13 ENCOUNTER — Ambulatory Visit (INDEPENDENT_AMBULATORY_CARE_PROVIDER_SITE_OTHER): Payer: Medicare HMO | Admitting: Family Medicine

## 2020-11-13 VITALS — BP 137/71 | HR 80 | Temp 98.4°F | Resp 18 | Ht 67.0 in | Wt 195.1 lb

## 2020-11-13 DIAGNOSIS — F418 Other specified anxiety disorders: Secondary | ICD-10-CM

## 2020-11-13 DIAGNOSIS — Z23 Encounter for immunization: Secondary | ICD-10-CM | POA: Diagnosis not present

## 2020-11-13 DIAGNOSIS — I10 Essential (primary) hypertension: Secondary | ICD-10-CM

## 2020-11-13 DIAGNOSIS — M4306 Spondylolysis, lumbar region: Secondary | ICD-10-CM

## 2020-11-13 DIAGNOSIS — E669 Obesity, unspecified: Secondary | ICD-10-CM

## 2020-11-13 DIAGNOSIS — Z7409 Other reduced mobility: Secondary | ICD-10-CM | POA: Diagnosis not present

## 2020-11-13 DIAGNOSIS — M1712 Unilateral primary osteoarthritis, left knee: Secondary | ICD-10-CM

## 2020-11-13 DIAGNOSIS — R2232 Localized swelling, mass and lump, left upper limb: Secondary | ICD-10-CM

## 2020-11-13 DIAGNOSIS — E782 Mixed hyperlipidemia: Secondary | ICD-10-CM | POA: Diagnosis not present

## 2020-11-13 NOTE — Patient Instructions (Signed)
F/U as before, call if you need me soonerPlease bring medications to visit  An appointment for [place] of left armpit will be scheduled, we will try to get it 09/27 , around 11 to 11:30  You are being referred for in home PT twice weekly for 4 weeks  Flu vac today  Lipid, cmp and EGFr today  Careful not to fall  Thanks for choosing [facility], we consider it a privelige to serve you.

## 2020-11-14 LAB — CMP14+EGFR
ALT: 9 IU/L (ref 0–32)
AST: 18 IU/L (ref 0–40)
Albumin/Globulin Ratio: 1.8 (ref 1.2–2.2)
Albumin: 4.3 g/dL (ref 3.7–4.7)
Alkaline Phosphatase: 56 IU/L (ref 44–121)
BUN/Creatinine Ratio: 15 (ref 12–28)
BUN: 13 mg/dL (ref 8–27)
Bilirubin Total: 0.4 mg/dL (ref 0.0–1.2)
CO2: 24 mmol/L (ref 20–29)
Calcium: 9.6 mg/dL (ref 8.7–10.3)
Chloride: 106 mmol/L (ref 96–106)
Creatinine, Ser: 0.87 mg/dL (ref 0.57–1.00)
Globulin, Total: 2.4 g/dL (ref 1.5–4.5)
Glucose: 92 mg/dL (ref 65–99)
Potassium: 4.4 mmol/L (ref 3.5–5.2)
Sodium: 144 mmol/L (ref 134–144)
Total Protein: 6.7 g/dL (ref 6.0–8.5)
eGFR: 69 mL/min/{1.73_m2} (ref >59)

## 2020-11-14 LAB — LIPID PANEL
Chol/HDL Ratio: 2.9 ratio (ref 0.0–4.4)
Cholesterol, Total: 146 mg/dL (ref 100–199)
HDL: 50 mg/dL (ref >39)
LDL Chol Calc (NIH): 77 mg/dL (ref 0–99)
Triglycerides: 103 mg/dL (ref 0–149)
VLDL Cholesterol Cal: 19 mg/dL (ref 5–40)

## 2020-11-15 ENCOUNTER — Other Ambulatory Visit: Payer: Self-pay | Admitting: Family Medicine

## 2020-11-15 ENCOUNTER — Telehealth: Payer: Self-pay

## 2020-11-15 DIAGNOSIS — R2232 Localized swelling, mass and lump, left upper limb: Secondary | ICD-10-CM

## 2020-11-15 NOTE — Telephone Encounter (Signed)
Theresa Garcia from [place] called left voicemail asked to return call [phone].

## 2020-11-15 NOTE — Telephone Encounter (Signed)
Theresa Garcia states she cannot get the home health authorized without the note being closed.

## 2020-11-17 ENCOUNTER — Encounter: Payer: Self-pay | Admitting: Family Medicine

## 2020-11-17 NOTE — Assessment & Plan Note (Signed)
Controlled, no change in medication DASH diet and commitment to daily physical activity for a minimum of 30 minutes discussed and encouraged, as a part of hypertension management. The importance of attaining a healthy weight is also discussed.  BP/Weight 11/13/2020 04/29/2020 04/18/2020 04/15/2020 02/26/2020 0/81/4481 09/27/6312  Systolic BP 970 263 785 885 - [phone]  Diastolic BP 71 90 70 94 - 86 20  Wt. (Lbs) 195.12 196.25 193 199 195.4 194 194  BMI 30.56 30.74 30.23 31.17 31.54 31.31 30.38

## 2020-11-17 NOTE — Progress Notes (Signed)
KAYLAN YATES     MRN: [id number]      DOB: 1940-04-30   HPI Ms. Vandoren is here with c/o lump in left armpit, she has a h/o left breast cancer , mammogram was 1 month ago. She has had concerned about this area in the past , and is here again with her sister voicing concerns C/o unsteadiness, and increased near falls, difficulty getting up out of chair , has had no falls, requests and will benefit from in home PT  ROS Denies recent fever or chills. Denies sinus pressure, nasal congestion, ear pain or sore throat. Denies chest congestion, productive cough or wheezing. Denies chest pains, palpitations and leg swelling Denies abdominal pain, nausea, vomiting,diarrhea or constipation.   Denies dysuria, frequency, hesitancy or incontinence. Increased joint pain, stiffness and unsteady while walking, at times feels as though she will fall Denies headaches, seizures, numbness, or tingling. Denies uncontrolled depression, anxiety or insomnia. Denies skin break down or rash.   PE  BP 137/71   Pulse 80   Temp 98.4 F (36.9 C)   Resp 18   Ht 5\' 7"  (1.702 m)   Wt 195 lb 1.9 oz (88.5 kg)   SpO2 95%   BMI 30.56 kg/m   Patient alert and oriented and in no cardiopulmonary distress.  HEENT: No facial asymmetry, EOMI,     Neck supple .  Chest: Clear to auscultation bilaterally.Tender nodule in left axilla, no overlying erythema  , warmth or drainage  CVS: S1, S2 no murmurs, no S3.Regular rate.  ABD: Soft non tender.   Ext: No edema  MS: decreased  ROM spine, , hips and knees.  Skin: Intact, no ulcerations or rash noted.  Psych: Good eye contact, normal affect. Memory intact not anxious or depressed appearing.  CNS: CN 2-12 intact, power,  normal throughout.no focal deficits noted.   Assessment & Plan  Axillary lump, left Recurrent c/o mass in left axilla, nodule palpable, non tender, no overlying warmth , erythema or tenderness, personal h?o left breast cancer, refer for  [place]  Depression with anxiety Increased anxiety surrounding axillary nodule, no med change at this time  Essential hypertension Controlled, no change in medication DASH diet and commitment to daily physical activity for a minimum of 30 minutes discussed and encouraged, as a part of hypertension management. The importance of attaining a healthy weight is also discussed.  BP/Weight 11/13/2020 04/29/2020 04/18/2020 04/15/2020 02/26/2020 2/45/8099 09/26/3823  Systolic BP 053 976 734 193 - 790 240  Diastolic BP 71 90 70 94 - 86 20  Wt. (Lbs) 195.12 196.25 193 199 195.4 194 194  BMI 30.56 30.74 30.23 31.17 31.54 31.31 30.38       Obesity (BMI 30.0-34.9)  Patient re-educated about  the importance of commitment to a  minimum of 150 minutes of exercise per week as able.  The importance of healthy food choices with portion control discussed, as well as eating regularly and within a 12 hour window most days. The need to choose "clean , green" food 50 to 75% of the time is discussed, as well as to make water the primary drink and set a goal of 64 ounces water daily.    Weight /BMI 11/13/2020 04/29/2020 04/18/2020  WEIGHT 195 lb 1.9 oz 196 lb 4 oz 193 lb  HEIGHT 5\' 7"  5\' 7"  5\' 7"   BMI 30.56 kg/m2 30.74 kg/m2 30.23 kg/m2      Poor mobility Recurrent near falls, unsteady gait, difficulty standing up from chair,  refer for twice weekly in home PT for 4 weeks Home safety reviewed and fall risk reduction  Hyperlipidemia Hyperlipidemia:Low fat diet discussed and encouraged.   Lipid Panel  Lab Results  Component Value Date   CHOL 146 11/13/2020   HDL 50 11/13/2020   LDLCALC 77 11/13/2020   TRIG 103 11/13/2020   CHOLHDL 2.9 11/13/2020     Controlled, no change in medication

## 2020-11-17 NOTE — Assessment & Plan Note (Signed)
  Patient re-educated about  the importance of commitment to a  minimum of 150 minutes of exercise per week as able.  The importance of healthy food choices with portion control discussed, as well as eating regularly and within a 12 hour window most days. The need to choose "clean , green" food 50 to 75% of the time is discussed, as well as to make water the primary drink and set a goal of 64 ounces water daily.    Weight /BMI 11/13/2020 04/29/2020 04/18/2020  WEIGHT 195 lb 1.9 oz 196 lb 4 oz 193 lb  HEIGHT 5\' 7"  5\' 7"  5\' 7"   BMI 30.56 kg/m2 30.74 kg/m2 30.23 kg/m2

## 2020-11-17 NOTE — Assessment & Plan Note (Addendum)
Recurrent near falls, unsteady gait, difficulty standing up from chair, refer for twice weekly in home PT for 4 weeks Home safety reviewed and fall risk reduction

## 2020-11-17 NOTE — Assessment & Plan Note (Signed)
Increased anxiety surrounding axillary nodule, no med change at this time

## 2020-11-17 NOTE — Assessment & Plan Note (Signed)
Recurrent c/o mass in left axilla, nodule palpable, non tender, no overlying warmth , erythema or tenderness, personal h?o left breast cancer, refer for [place]

## 2020-11-17 NOTE — Assessment & Plan Note (Signed)
Hyperlipidemia:Low fat diet discussed and encouraged.   Lipid Panel  Lab Results  Component Value Date   CHOL 146 11/13/2020   HDL 50 11/13/2020   LDLCALC 77 11/13/2020   TRIG 103 11/13/2020   CHOLHDL 2.9 11/13/2020     Controlled, no change in medication

## 2020-11-18 ENCOUNTER — Encounter: Payer: Medicare HMO | Admitting: Family Medicine

## 2020-11-18 NOTE — Telephone Encounter (Signed)
Note faxed to Santiago Glad at [employer]

## 2020-11-19 ENCOUNTER — Ambulatory Visit (HOSPITAL_COMMUNITY): Admission: RE | Admit: 2020-11-19 | Payer: Medicare HMO | Source: Ambulatory Visit

## 2020-11-26 ENCOUNTER — Ambulatory Visit (HOSPITAL_COMMUNITY)
Admission: RE | Admit: 2020-11-26 | Discharge: 2020-11-26 | Disposition: A | Discharge status: Home / Self Care | Payer: Medicare HMO | Source: Ambulatory Visit | Attending: Family Medicine | Admitting: Family Medicine

## 2020-11-26 ENCOUNTER — Other Ambulatory Visit: Payer: Self-pay

## 2020-11-26 DIAGNOSIS — R2232 Localized swelling, mass and lump, left upper limb: Secondary | ICD-10-CM | POA: Diagnosis present

## 2020-11-26 DIAGNOSIS — Z853 Personal history of malignant neoplasm of breast: Secondary | ICD-10-CM | POA: Diagnosis not present

## 2020-11-28 ENCOUNTER — Ambulatory Visit (INDEPENDENT_AMBULATORY_CARE_PROVIDER_SITE_OTHER): Payer: Medicare HMO | Admitting: Family Medicine

## 2020-11-28 ENCOUNTER — Other Ambulatory Visit: Payer: Self-pay

## 2020-11-28 ENCOUNTER — Encounter: Payer: Self-pay | Admitting: Family Medicine

## 2020-11-28 VITALS — BP 133/76 | HR 78 | Resp 17 | Ht 66.0 in | Wt 194.1 lb

## 2020-11-28 DIAGNOSIS — Z0000 Encounter for general adult medical examination without abnormal findings: Secondary | ICD-10-CM

## 2020-11-28 DIAGNOSIS — E559 Vitamin D deficiency, unspecified: Secondary | ICD-10-CM

## 2020-11-28 DIAGNOSIS — H547 Unspecified visual loss: Secondary | ICD-10-CM

## 2020-11-28 DIAGNOSIS — I10 Essential (primary) hypertension: Secondary | ICD-10-CM

## 2020-11-28 DIAGNOSIS — E782 Mixed hyperlipidemia: Secondary | ICD-10-CM

## 2020-11-28 MED ORDER — TEMAZEPAM 30 MG PO CAPS: 30.0000 mg | ORAL_CAPSULE | Freq: Every evening | ORAL | 5 refills | Status: DC | PRN

## 2020-11-28 NOTE — Assessment & Plan Note (Signed)
reducwed vision left worse than right, refer eye exam

## 2020-11-28 NOTE — Patient Instructions (Signed)
F/u in 6 months, call if you need me sooner  Fasting CBC, lipid, cmp and eGFr, TSH and vit D 1 week before next visit.  You are referred for eye exam  Start daily chair exercises please  No medication changes  Please get shingrix vaccines and TdAp at your pharmacy  Thanks for choosing [facility], we consider it a privelige to serve you.

## 2020-11-28 NOTE — Progress Notes (Signed)
    Theresa Garcia     MRN: [id number]      DOB: 1940-10-07  HPI: Patient is in for annual physical exam. No other health concerns are expressed or addressed at the visit. Recent labs,  are reviewed. Immunization is reviewed , and  updated if needed.   PE: BP 133/76   Pulse 78   Resp 17   Ht 5\' 6"  (1.676 m)   Wt 194 lb 1.3 oz (88 kg)   SpO2 94%   BMI 31.33 kg/m   Pleasant  female, alert and oriented x 3, in no cardio-pulmonary distress. Afebrile. HEENT No facial trauma or asymetry. Sinuses non tender.  Extra occullar muscles intact.. External ears normal, . Neck: supple, no adenopathy,JVD or thyromegaly.No bruits.  Chest: Clear to ascultation bilaterally.No crackles or wheezes. Non tender to palpation  Cardiovascular system; Heart sounds normal,  S1 and  S2 ,no S3.  No murmur, or thrill. Apical beat not displaced Peripheral pulses normal.  Abdomen: Soft, non tender, no organomegaly or masses. No bruits. Bowel sounds normal. No guarding, tenderness or rebound.    Musculoskeletal exam: Full ROM of spine, hips , shoulders and knees. No deformity ,swelling or crepitus noted. No muscle wasting or atrophy.   Neurologic: Cranial nerves 2 to 12 intact. Power, tone ,sensation and reflexes normal throughout. No disturbance in gait. No tremor.  Skin: Intact, no ulceration, erythema , scaling or rash noted. Pigmentation normal throughout  Psych; Normal mood and affect. Judgement and concentration normal   Assessment & Plan:  Annual physical exam Annual exam as documented. Counseling done  re healthy lifestyle involving commitment to 150 minutes exercise per week, heart healthy diet, and attaining healthy weight.The importance of adequate sleep also discussed. Regular seat belt use and home safety, is also discussed. Changes in health habits are decided on by the patient with goals and time frames  set for achieving them. Immunization and cancer screening  needs are specifically addressed at this visit.   Reduced vision reducwed vision left worse than right, refer eye exam

## 2020-11-28 NOTE — Assessment & Plan Note (Signed)

## 2020-11-30 ENCOUNTER — Encounter: Payer: Self-pay | Admitting: Family Medicine

## 2020-12-06 ENCOUNTER — Other Ambulatory Visit: Payer: Self-pay | Admitting: Family Medicine

## 2020-12-06 DIAGNOSIS — Z23 Encounter for immunization: Secondary | ICD-10-CM

## 2020-12-06 DIAGNOSIS — R110 Nausea: Secondary | ICD-10-CM

## 2020-12-06 DIAGNOSIS — R5383 Other fatigue: Secondary | ICD-10-CM

## 2020-12-06 DIAGNOSIS — R1110 Vomiting, unspecified: Secondary | ICD-10-CM

## 2020-12-09 ENCOUNTER — Telehealth: Payer: Self-pay | Admitting: Family Medicine

## 2020-12-09 NOTE — Telephone Encounter (Signed)
Please call Santiago Glad ,regarding pt.

## 2020-12-09 NOTE — Telephone Encounter (Signed)
Need to change Dx in order for [facility] to see her.  Dementia will not work any longer.  Theresa Garcia states it needs to be the cause for the dementia.

## 2020-12-10 NOTE — Telephone Encounter (Signed)
Forwarded to [place]

## 2020-12-10 NOTE — Addendum Note (Signed)
Addended by: Eual Fines on: 12/10/2020 04:01 PM   Modules accepted: Orders

## 2020-12-10 NOTE — Telephone Encounter (Signed)
[facility] said medicare will no longer accept poor mobility, multiple falls, weakness etc for physical therapy.. It has to be a documented diagnosis that is the "cause" of the mobility, fall and weakness, pain

## 2020-12-26 ENCOUNTER — Ambulatory Visit (INDEPENDENT_AMBULATORY_CARE_PROVIDER_SITE_OTHER): Payer: Medicare HMO | Admitting: Orthopaedic Surgery

## 2020-12-26 ENCOUNTER — Ambulatory Visit (INDEPENDENT_AMBULATORY_CARE_PROVIDER_SITE_OTHER): Payer: Medicare HMO | Admitting: Family Medicine

## 2020-12-26 ENCOUNTER — Other Ambulatory Visit: Payer: Self-pay

## 2020-12-26 ENCOUNTER — Encounter: Payer: Self-pay | Admitting: Family Medicine

## 2020-12-26 ENCOUNTER — Encounter: Payer: Self-pay | Admitting: Orthopaedic Surgery

## 2020-12-26 VITALS — BP 145/84 | HR 80 | Resp 17 | Ht 67.0 in | Wt 197.0 lb

## 2020-12-26 DIAGNOSIS — I10 Essential (primary) hypertension: Secondary | ICD-10-CM | POA: Diagnosis not present

## 2020-12-26 DIAGNOSIS — M1712 Unilateral primary osteoarthritis, left knee: Secondary | ICD-10-CM | POA: Diagnosis not present

## 2020-12-26 DIAGNOSIS — M25562 Pain in left knee: Secondary | ICD-10-CM

## 2020-12-26 MED ORDER — METHYLPREDNISOLONE ACETATE 40 MG/ML IJ SUSP
40.0000 mg | INTRAMUSCULAR | Status: AC | PRN
  Administered 2020-12-26: 40 mg via INTRA_ARTICULAR

## 2020-12-26 MED ORDER — BUPIVACAINE HCL 0.5 % IJ SOLN
3.0000 mL | INTRAMUSCULAR | Status: AC | PRN
Start: 2020-12-26 — End: 2020-12-26
  Administered 2020-12-26: 3 mL via INTRA_ARTICULAR

## 2020-12-26 MED ORDER — LIDOCAINE HCL 1 % IJ SOLN
0.5000 mL | INTRAMUSCULAR | Status: AC | PRN
  Administered 2020-12-26: .5 mL

## 2020-12-26 NOTE — Progress Notes (Signed)
Office Visit Note   Patient: Theresa Garcia           Date of Birth: 02/04/1941           MRN: [id number] Visit Date: 12/26/2020              Requested by: Fayrene Helper, MD [address], [place],  [place] [address] PCP: Fayrene Helper, MD   Assessment & Plan: Visit Diagnoses:  1. Unilateral primary osteoarthritis, left knee     Plan: Left knee injection performed which she tolerated well she will follow-up if she has persistent symptoms.  She continues to have catching she will let us know we will consider diagnostic MRI scanning.  Continue use of cane fall prevention discussed.  Follow-Up Instructions: No follow-ups on file.   Orders:  No orders of the defined types were placed in this encounter.  No orders of the defined types were placed in this encounter.     Procedures: Large Joint Inj: L knee on 12/26/2020 2:16 PM Indications: joint swelling and pain Details: 22 G 1.5 in needle, anterolateral approach  Arthrogram: No  Medications: 0.5 mL lidocaine 1 %; 3 mL bupivacaine 0.5 %; 40 mg methylPREDNISolone acetate 40 MG/ML Outcome: tolerated well, no immediate complications Procedure, treatment alternatives, risks and benefits explained, specific risks discussed. Consent was given by the patient. Immediately prior to procedure a time out was called to verify the correct patient, procedure, equipment, support staff and site/side marked as required. Patient was prepped and draped in the usual sterile fashion.      Clinical Data: No additional findings.   Subjective: Chief Complaint  Patient presents with  . Left Knee - Pain    HPI 80 year old female seen with painful swelling left knee difficulty walking and she has had to use a cane.  She states her knee pops has been painful.  She had an injection in her shoulder which helped her knee likely cortisone.  She has had hypertension scarlet fever in the past also history of blood clots in the leg  with pulmonary embolism and is on chronic Eliquis currently 5 mg twice daily.  She is also on Fosamax.  Knee has been swollen and painful difficulty ambulating.  Radiograph shows mild to moderate knee osteoarthritis.  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  Her knee has been catching intermittently.  Review of Systems all the systems are noncontributory to HPI.   Objective: Vital Signs: Ht 5\' 7"  (1.702 m)   Wt 197 lb (89.4 kg)   BMI 30.85 kg/m   Physical Exam Constitutional:      Appearance: She is well-developed.  HENT:     Head: Normocephalic.     Right Ear: External ear normal.     Left Ear: External ear normal. There is no impacted cerumen.  Eyes:     Pupils: Pupils are equal, round, and reactive to light.  Neck:     Thyroid: No thyromegaly.     Trachea: No tracheal deviation.  Cardiovascular:     Rate and Rhythm: Normal rate.  Pulmonary:     Effort: Pulmonary effort is normal.  Abdominal:     Palpations: Abdomen is soft.  Musculoskeletal:     Cervical back: No rigidity.  Skin:    General: Skin is warm and dry.  Neurological:     Mental Status: She is alert and oriented to person, place, and time.  Psychiatric:        Behavior: Behavior normal.    Ortho Exam patient has crepitus knee extension medial and lateral anterior joint line pain.  Less tenderness posterior medial or posterior lateral.  ACL PCL is normal.  Negative logroll hips right and left.  No pitting edema lower extremities pedal pulses at the ankle palpable.  Specialty Comments:  No specialty comments available.  Imaging: Previous and knee x-rays 04/15/2020 showed mild to moderate knee degenerative changes with mild joint space narrowing.  Small osteophytes.   PMFS History: Patient Active Problem List   Diagnosis Date Noted  . Unilateral primary osteoarthritis, left knee 12/26/2020  . Poor mobility 11/13/2020  . Left anterior knee pain 03/25/2020  .  Localized swelling, mass or lump of neck 02/26/2020  . Reduced vision 09/29/2019  . Hip pain 09/25/2019  . Obesity (BMI 30.0-34.9) 08/08/2017  . Unsteady gait 05/31/2016  . Axillary lump, left 04/03/2016  . Annual physical exam 07/24/2015  . GERD (gastroesophageal reflux disease) 04/27/2015  . Back pain with radiation 01/29/2015  . Depression with anxiety 11/27/2013  . Type 2 HSV infection of vulvovaginal region 03/12/2012  . Back pain 02/02/2011  . History of breast cancer in female 01/13/2010  . Hyperlipidemia 01/13/2010  . Essential hypertension 01/13/2010  . Insomnia 01/13/2010   Past Medical History:  Diagnosis Date  . Breast cancer ([place]) 02/24/2000  . Bronchitis   . Cancer ([place])   . Chronic back pain   . Depression   . GERD (gastroesophageal reflux disease)   . Hyperlipidemia   . Hypertension   . Insomnia   . Kidney stone   . Personal history of chemotherapy   . Personal history of radiation therapy   . Pneumonia   . Vertigo     Family History  Problem Relation Age of Onset  . COPD Father   . COPD Sister   . COPD Sister   . Diabetes Sister   . Diabetes Brother   . Seizures Mother   . Bone cancer Brother   . Heart disease Brother   . Aneurysm Brother        brain  . HIV/AIDS Brother     Past Surgical History:  Procedure Laterality Date  . ABDOMINAL HYSTERECTOMY    . BREAST LUMPECTOMY Left 2002  . BREAST SURGERY     left  . CYSTOSCOPY/RETROGRADE/URETEROSCOPY/STONE EXTRACTION WITH BASKET  01/07/2011   Procedure: CYSTOSCOPY/RETROGRADE/URETEROSCOPY/STONE EXTRACTION WITH BASKET;  Surgeon: Marissa Nestle;  Location: [facility];  Service: Urology;  Laterality: Left;  Balloon Dilatation; stone given to family per MD  . TUBAL LIGATION     Social History   Occupational History  . Not on file  Tobacco Use  . Smoking status: Former    Packs/day: 1.00    Years: 5.00    Pack years: 5.00    Types: Cigarettes    Quit date: 09/07/1980    Years since quitting:  40.3  . Smokeless tobacco: Never  . Tobacco comments:    quit 25+ years ago  Substance and Sexual Activity  . Alcohol use: No    Alcohol/week: 0.0 standard drinks  . Drug use: No  . Sexual activity: Not Currently

## 2020-12-26 NOTE — Progress Notes (Signed)
   Theresa Garcia     MRN: [id number]      DOB: 1940/11/07   HPI Theresa Garcia is here fwith c/o pain and swelling of left knee  starting 12/17/2020. Rated at a 9 , no inciting trauma    ROS Denies recent fever or chills. Denies sinus pressure, nasal congestion, ear pain or sore throat. Denies chest congestion, productive cough or wheezing. Denies chest pains, palpitations and leg swelling Denies abdominal pain, nausea, vomiting,diarrhea or constipation.   Denies dysuria, frequency, hesitancy or incontinence. Denies headaches, seizures, numbness, or tingling. Denies depression, anxiety or insomnia. Denies skin break down or rash.   PE  BP (!) 145/84   Pulse 80   Resp 17   Ht 5\' 7"  (1.702 m)   Wt 197 lb (89.4 kg)   SpO2 93%   BMI 30.85 kg/m   Patient alert and oriented and in no cardiopulmonary distress.  HEENT: No facial asymmetry, EOMI,     Neck supple .  Chest: Clear to auscultation bilaterally.  CVS: S1, S2 no murmurs, no S3.Regular rate.  ABD: Soft non tender.   Ext: No edema  MS: Adequate ROM spine, shoulders, hips and reduced in left  knee.tender over anterior aspect of left knee  Skin: Intact, no ulcerations or rash noted.  Psych: Good eye contact, normal affect. Memory intact not anxious or depressed appearing.  CNS: CN 2-12 intact, power,  normal throughout.no focal deficits noted.   Assessment & Plan  Left anterior knee pain 10 day h/o acute pain and swelling of left knee, ortho asap , will fill in with  Oral meds and iM injections if have to wait till next week  Essential hypertension DASH diet and commitment to daily physical activity for a minimum of 30 minutes discussed and encouraged, as a part of hypertension management. The importance of attaining a healthy weight is also discussed. Uncontrolled / elevated at visit but pt in pain  BP/Weight 12/26/2020 12/26/2020 11/28/2020 11/13/2020 04/29/2020 04/18/2020 8/58/8502  Systolic BP - 774 128 786  767 209 470  Diastolic BP - 84 76 71 90 70 94  Wt. (Lbs) 197 197 194.08 195.12 196.25 193 199  BMI 30.85 30.85 31.33 30.56 30.74 30.23 31.17

## 2020-12-26 NOTE — Assessment & Plan Note (Signed)
10 day h/o acute pain and swelling of left knee, ortho asap , will fill in with  Oral meds and iM injections if have to wait till next week

## 2020-12-26 NOTE — Patient Instructions (Signed)
F/U as before, call if you need me sooner  You are referred to Orthopedics and you will be seen today.  Be careful not to fall   Thanks for choosing [facility], we consider it a privelige to serve you.

## 2020-12-29 ENCOUNTER — Encounter: Payer: Self-pay | Admitting: Family Medicine

## 2020-12-29 NOTE — Assessment & Plan Note (Signed)
DASH diet and commitment to daily physical activity for a minimum of 30 minutes discussed and encouraged, as a part of hypertension management. The importance of attaining a healthy weight is also discussed. Uncontrolled / elevated at visit but pt in pain  BP/Weight 12/26/2020 12/26/2020 11/28/2020 11/13/2020 04/29/2020 04/18/2020 4/47/3958  Systolic BP - 441 712 787 183 672 550  Diastolic BP - 84 76 71 90 70 94  Wt. (Lbs) 197 197 194.08 195.12 196.25 193 199  BMI 30.85 30.85 31.33 30.56 30.74 30.23 31.17

## 2020-12-31 ENCOUNTER — Other Ambulatory Visit: Payer: Self-pay

## 2020-12-31 DIAGNOSIS — M4306 Spondylolysis, lumbar region: Secondary | ICD-10-CM

## 2020-12-31 DIAGNOSIS — M25562 Pain in left knee: Secondary | ICD-10-CM

## 2020-12-31 DIAGNOSIS — Z7409 Other reduced mobility: Secondary | ICD-10-CM

## 2021-01-24 ENCOUNTER — Other Ambulatory Visit: Payer: Self-pay | Admitting: Family Medicine

## 2021-01-24 DIAGNOSIS — R110 Nausea: Secondary | ICD-10-CM

## 2021-01-24 DIAGNOSIS — R5383 Other fatigue: Secondary | ICD-10-CM

## 2021-01-24 DIAGNOSIS — Z23 Encounter for immunization: Secondary | ICD-10-CM

## 2021-01-24 DIAGNOSIS — R1110 Vomiting, unspecified: Secondary | ICD-10-CM

## 2021-01-28 ENCOUNTER — Telehealth: Payer: Self-pay

## 2021-01-28 NOTE — Telephone Encounter (Signed)
Spoke with Crystal and let her know Dr  Moshe Cipro is out of the office and the orders will not be signed until 5-7 days after Dr Griffin Dakin return.

## 2021-01-28 NOTE — Telephone Encounter (Signed)
Crystal From [facility] called received care of plan and it was signed by a different provider and it must be signed by the provider that written the order.   She will refax for Dr Moshe Cipro signature.  Crystal call back # [phone]

## 2021-01-30 ENCOUNTER — Other Ambulatory Visit: Payer: Self-pay | Admitting: Family Medicine

## 2021-01-30 DIAGNOSIS — R5383 Other fatigue: Secondary | ICD-10-CM

## 2021-01-30 DIAGNOSIS — Z23 Encounter for immunization: Secondary | ICD-10-CM

## 2021-01-30 DIAGNOSIS — R1110 Vomiting, unspecified: Secondary | ICD-10-CM

## 2021-01-30 DIAGNOSIS — R110 Nausea: Secondary | ICD-10-CM

## 2021-02-03 ENCOUNTER — Other Ambulatory Visit: Payer: Self-pay | Admitting: Family Medicine

## 2021-02-04 ENCOUNTER — Telehealth: Payer: Self-pay

## 2021-02-04 NOTE — Telephone Encounter (Signed)
Crystal from [employer] called faxed over a plan of care 11/29 and checking on the receipt of order and status. Order # [id number].  Nurse return call to [phone].

## 2021-02-04 NOTE — Telephone Encounter (Signed)
Spoke with crystal advised Dr Moshe Cipro first day back and as before the forms will be ready in 3-5 days

## 2021-02-06 DIAGNOSIS — Z7409 Other reduced mobility: Secondary | ICD-10-CM

## 2021-02-06 DIAGNOSIS — Z6830 Body mass index (BMI) 30.0-30.9, adult: Secondary | ICD-10-CM

## 2021-02-06 DIAGNOSIS — E669 Obesity, unspecified: Secondary | ICD-10-CM

## 2021-02-06 DIAGNOSIS — F32A Depression, unspecified: Secondary | ICD-10-CM

## 2021-02-06 DIAGNOSIS — M4726 Other spondylosis with radiculopathy, lumbar region: Secondary | ICD-10-CM | POA: Diagnosis not present

## 2021-02-06 DIAGNOSIS — Z853 Personal history of malignant neoplasm of breast: Secondary | ICD-10-CM

## 2021-02-06 DIAGNOSIS — Z7901 Long term (current) use of anticoagulants: Secondary | ICD-10-CM

## 2021-02-06 DIAGNOSIS — F419 Anxiety disorder, unspecified: Secondary | ICD-10-CM

## 2021-02-06 DIAGNOSIS — Z9181 History of falling: Secondary | ICD-10-CM

## 2021-02-06 DIAGNOSIS — M1712 Unilateral primary osteoarthritis, left knee: Secondary | ICD-10-CM | POA: Diagnosis not present

## 2021-02-06 DIAGNOSIS — E782 Mixed hyperlipidemia: Secondary | ICD-10-CM

## 2021-02-06 DIAGNOSIS — I10 Essential (primary) hypertension: Secondary | ICD-10-CM

## 2021-02-06 DIAGNOSIS — L02412 Cutaneous abscess of left axilla: Secondary | ICD-10-CM

## 2021-02-20 ENCOUNTER — Other Ambulatory Visit: Payer: Self-pay | Admitting: Family Medicine

## 2021-02-24 ENCOUNTER — Other Ambulatory Visit: Payer: Self-pay | Admitting: Family Medicine

## 2021-03-10 ENCOUNTER — Telehealth: Payer: Self-pay

## 2021-04-12 ENCOUNTER — Other Ambulatory Visit: Payer: Self-pay | Admitting: Family Medicine

## 2021-04-12 DIAGNOSIS — R1110 Vomiting, unspecified: Secondary | ICD-10-CM

## 2021-04-12 DIAGNOSIS — Z23 Encounter for immunization: Secondary | ICD-10-CM

## 2021-04-12 DIAGNOSIS — R110 Nausea: Secondary | ICD-10-CM

## 2021-04-12 DIAGNOSIS — R5383 Other fatigue: Secondary | ICD-10-CM

## 2021-04-16 ENCOUNTER — Other Ambulatory Visit: Payer: Self-pay

## 2021-04-16 MED ORDER — AMLODIPINE BESY-BENAZEPRIL HCL 5-40 MG PO CAPS: 1.0000 | ORAL_CAPSULE | Freq: Every day | ORAL | 3 refills | Status: DC

## 2021-04-27 ENCOUNTER — Other Ambulatory Visit: Payer: Self-pay | Admitting: Nurse Practitioner

## 2021-04-28 ENCOUNTER — Other Ambulatory Visit: Payer: Self-pay | Admitting: Family Medicine

## 2021-05-18 ENCOUNTER — Other Ambulatory Visit: Payer: Self-pay | Admitting: Family Medicine

## 2021-05-18 DIAGNOSIS — R1110 Vomiting, unspecified: Secondary | ICD-10-CM

## 2021-05-18 DIAGNOSIS — Z23 Encounter for immunization: Secondary | ICD-10-CM

## 2021-05-18 DIAGNOSIS — R110 Nausea: Secondary | ICD-10-CM

## 2021-05-18 DIAGNOSIS — R5383 Other fatigue: Secondary | ICD-10-CM

## 2021-05-24 ENCOUNTER — Other Ambulatory Visit: Payer: Self-pay | Admitting: Family Medicine

## 2021-05-30 ENCOUNTER — Other Ambulatory Visit: Payer: Self-pay | Admitting: Family Medicine

## 2021-05-30 DIAGNOSIS — Z23 Encounter for immunization: Secondary | ICD-10-CM

## 2021-05-30 DIAGNOSIS — R1110 Vomiting, unspecified: Secondary | ICD-10-CM

## 2021-05-30 DIAGNOSIS — R5383 Other fatigue: Secondary | ICD-10-CM

## 2021-05-30 DIAGNOSIS — R110 Nausea: Secondary | ICD-10-CM

## 2021-06-02 ENCOUNTER — Telehealth: Payer: Self-pay

## 2021-06-02 ENCOUNTER — Other Ambulatory Visit: Payer: Self-pay | Admitting: *Deleted

## 2021-06-02 MED ORDER — TEMAZEPAM 30 MG PO CAPS: 30.0000 mg | ORAL_CAPSULE | Freq: Every evening | ORAL | 5 refills | Status: DC | PRN

## 2021-06-02 NOTE — Telephone Encounter (Signed)
Medication sent to pharmacy  

## 2021-06-02 NOTE — Telephone Encounter (Signed)
Patient called need med refill ?temazepam (RESTORIL) 30 MG capsule ?[facility] ?

## 2021-06-03 ENCOUNTER — Ambulatory Visit: Payer: Medicare HMO | Admitting: Family Medicine

## 2021-06-05 ENCOUNTER — Other Ambulatory Visit: Payer: Self-pay | Admitting: Family Medicine

## 2021-06-05 MED ORDER — TEMAZEPAM 30 MG PO CAPS: 30.0000 mg | ORAL_CAPSULE | Freq: Every evening | ORAL | 3 refills | Status: DC | PRN

## 2021-06-18 ENCOUNTER — Other Ambulatory Visit: Payer: Self-pay | Admitting: Family Medicine

## 2021-06-18 DIAGNOSIS — E559 Vitamin D deficiency, unspecified: Secondary | ICD-10-CM

## 2021-06-18 DIAGNOSIS — I10 Essential (primary) hypertension: Secondary | ICD-10-CM

## 2021-06-18 DIAGNOSIS — E669 Obesity, unspecified: Secondary | ICD-10-CM

## 2021-06-18 DIAGNOSIS — E782 Mixed hyperlipidemia: Secondary | ICD-10-CM

## 2021-07-01 ENCOUNTER — Other Ambulatory Visit: Payer: Self-pay

## 2021-07-01 MED ORDER — APIXABAN 5 MG PO TABS: 5.0000 mg | ORAL_TABLET | Freq: Two times a day (BID) | ORAL | 0 refills | Status: DC

## 2021-07-01 MED ORDER — VITAMIN D (ERGOCALCIFEROL) 1.25 MG (50000 UNIT) PO CAPS
50000.0000 [IU] | ORAL_CAPSULE | ORAL | 0 refills | Status: DC
Start: 2021-07-01 — End: 2021-09-10

## 2021-07-01 MED ORDER — TEMAZEPAM 30 MG PO CAPS: 30.0000 mg | ORAL_CAPSULE | Freq: Every evening | ORAL | 3 refills | Status: DC | PRN

## 2021-07-01 MED ORDER — FLUOXETINE HCL 10 MG PO CAPS: 10.0000 mg | ORAL_CAPSULE | Freq: Every day | ORAL | 0 refills | Status: DC

## 2021-07-01 MED ORDER — ACYCLOVIR 400 MG PO TABS: 400.0000 mg | ORAL_TABLET | Freq: Two times a day (BID) | ORAL | 0 refills | Status: DC

## 2021-07-04 ENCOUNTER — Encounter: Payer: Self-pay | Admitting: Family Medicine

## 2021-07-04 ENCOUNTER — Ambulatory Visit: Payer: Medicare Other | Admitting: Family Medicine

## 2021-07-04 VITALS — BP 119/75 | HR 83 | Resp 16 | Ht 67.0 in | Wt 198.0 lb

## 2021-07-04 DIAGNOSIS — R2681 Unsteadiness on feet: Secondary | ICD-10-CM

## 2021-07-04 DIAGNOSIS — Z1231 Encounter for screening mammogram for malignant neoplasm of breast: Secondary | ICD-10-CM | POA: Diagnosis not present

## 2021-07-04 DIAGNOSIS — E049 Nontoxic goiter, unspecified: Secondary | ICD-10-CM

## 2021-07-04 DIAGNOSIS — F418 Other specified anxiety disorders: Secondary | ICD-10-CM

## 2021-07-04 DIAGNOSIS — I10 Essential (primary) hypertension: Secondary | ICD-10-CM

## 2021-07-04 DIAGNOSIS — E66811 Obesity, class 1: Secondary | ICD-10-CM

## 2021-07-04 DIAGNOSIS — E669 Obesity, unspecified: Secondary | ICD-10-CM

## 2021-07-04 DIAGNOSIS — E782 Mixed hyperlipidemia: Secondary | ICD-10-CM

## 2021-07-04 NOTE — Patient Instructions (Addendum)
Annual exam  first week in  Nov, call if you need me sooner ? ?Please schedule mammogram at checkout ? ?You are referred  for [place] of your thyroid gland we will contact you with appt info ? ?We will call with lab results ? ?Please get 2nd shingrix, covid booster and TdaP at your pharmacy, separately ? ?No new meds ? ?Use cane for safety, please be careful no falls ? ?Thanks for choosing [facility], we consider it a privelige to serve you. ? ?

## 2021-07-05 LAB — CMP14+EGFR
ALT: 11 IU/L (ref 0–32)
AST: 20 IU/L (ref 0–40)
Albumin/Globulin Ratio: 1.7 (ref 1.2–2.2)
Albumin: 4.3 g/dL (ref 3.7–4.7)
Alkaline Phosphatase: 56 IU/L (ref 44–121)
BUN/Creatinine Ratio: 14 (ref 12–28)
BUN: 12 mg/dL (ref 8–27)
Bilirubin Total: 0.4 mg/dL (ref 0.0–1.2)
CO2: 20 mmol/L (ref 20–29)
Calcium: 9.9 mg/dL (ref 8.7–10.3)
Chloride: 105 mmol/L (ref 96–106)
Creatinine, Ser: 0.84 mg/dL (ref 0.57–1.00)
Globulin, Total: 2.6 g/dL (ref 1.5–4.5)
Glucose: 89 mg/dL (ref 70–99)
Potassium: 4.5 mmol/L (ref 3.5–5.2)
Sodium: 139 mmol/L (ref 134–144)
Total Protein: 6.9 g/dL (ref 6.0–8.5)
eGFR: 71 mL/min/{1.73_m2} (ref >59)

## 2021-07-05 LAB — CBC
Hematocrit: 40.9 % (ref 34.0–46.6)
Hemoglobin: 13.6 g/dL (ref 11.1–15.9)
MCH: 31.7 pg (ref 26.6–33.0)
MCHC: 33.3 g/dL (ref 31.5–35.7)
MCV: 95 fL (ref 79–97)
Platelets: 269 10*3/uL (ref 150–450)
RBC: 4.29 x10E6/uL (ref 3.77–5.28)
RDW: 13.1 % (ref 11.7–15.4)
WBC: 6.3 10*3/uL (ref 3.4–10.8)

## 2021-07-05 LAB — LIPID PANEL
Chol/HDL Ratio: 2.8 ratio (ref 0.0–4.4)
Cholesterol, Total: 149 mg/dL (ref 100–199)
HDL: 53 mg/dL (ref >39)
LDL Chol Calc (NIH): 78 mg/dL (ref 0–99)
Triglycerides: 96 mg/dL (ref 0–149)
VLDL Cholesterol Cal: 18 mg/dL (ref 5–40)

## 2021-07-05 LAB — TSH: TSH: 1.48 u[IU]/mL (ref 0.450–4.500)

## 2021-07-05 LAB — VITAMIN D 25 HYDROXY (VIT D DEFICIENCY, FRACTURES): Vit D, 25-Hydroxy: 41.9 ng/mL (ref 30.0–100.0)

## 2021-07-06 ENCOUNTER — Encounter: Payer: Self-pay | Admitting: Family Medicine

## 2021-07-06 NOTE — Assessment & Plan Note (Signed)
Home safety and continued use of assistive device discussed ?

## 2021-07-06 NOTE — Assessment & Plan Note (Signed)
?  Patient re-educated about  the importance of commitment to a  minimum of 150 minutes of exercise per week as able. ? ?The importance of healthy food choices with portion control discussed, as well as eating regularly and within a 12 hour window most days. ?The need to choose "clean , green" food 50 to 75% of the time is discussed, as well as to make water the primary drink and set a goal of 64 ounces water daily. ? ?  ? ?  07/04/2021  ?  1:54 PM 12/26/2020  ?  1:51 PM 12/26/2020  ? 10:31 AM  ?Weight /BMI  ?Weight 198 lb 197 lb 197 lb  ?Height '5\' 7"'$  (1.702 m) '5\' 7"'$  (1.702 m) '5\' 7"'$  (1.702 m)  ?BMI 31.01 kg/m2 30.85 kg/m2 30.85 kg/m2  ? ? ? ?

## 2021-07-06 NOTE — Assessment & Plan Note (Signed)
Controlled, no change in medication ?DASH diet and commitment to daily physical activity for a minimum of 30 minutes discussed and encouraged, as a part of hypertension management. ?The importance of attaining a healthy weight is also discussed. ? ? ?  07/04/2021  ?  1:54 PM 12/26/2020  ?  1:51 PM 12/26/2020  ? 10:31 AM 11/28/2020  ?  1:10 PM 11/13/2020  ? 10:07 AM 04/29/2020  ?  1:53 PM 04/18/2020  ?  2:51 PM  ?BP/Weight  ?Systolic BP 448  185 631 497 107 124  ?Diastolic BP 75  84 76 71 90 70  ?Wt. (Lbs) 198 197 197 194.08 195.12 196.25 193  ?BMI 31.01 kg/m2 30.85 kg/m2 30.85 kg/m2 31.33 kg/m2 30.56 kg/m2 30.74 kg/m2 30.23 kg/m2  ? ? ? ? ?

## 2021-07-06 NOTE — Progress Notes (Signed)
? ?DYNISHA DUE     MRN: [id number]      DOB: 1940/04/23 ? ? ?HPI ?Theresa Garcia is here for follow up and re-evaluation of chronic medical conditions, medication management and review of any available recent lab and radiology data.  ?Preventive health is updated, specifically  Cancer screening and Immunization.   ?Questions or concerns regarding consultations or procedures which the PT has had in the interim are  addressed. ?The PT denies any adverse reactions to current medications since the last visit.  ?C/o right neck mass x several months ? ?ROS ?Denies recent fever or chills. ?Denies sinus pressure, nasal congestion, ear pain or sore throat. ?Denies chest congestion, productive cough or wheezing. ?Denies chest pains, palpitations and leg swelling ?Denies abdominal pain, nausea, vomiting,diarrhea or constipation.   ?Denies dysuria, frequency, hesitancy or incontinence. ?Denies joint pain, swelling and limitation in mobility. ?Denies headaches, seizures, numbness, or tingling. ?Denies depression, anxiety or insomnia. ?Denies skin break down or rash. ? ? ?PE ? ?BP 119/75   Pulse 83   Resp 16   Ht '5\' 7"'$  (1.702 m)   Wt 198 lb (89.8 kg)   SpO2 93%   BMI 31.01 kg/m?  ? ?Patient alert and oriented and in no cardiopulmonary distress. ? ?HEENT: No facial asymmetry, EOMI,     Neck supple .Thyromegaly , enlarged right lobe ? ?Chest: Clear to auscultation bilaterally. ? ?CVS: S1, S2 no murmurs, no S3.Regular rate. ? ?ABD: Soft non tender.  ? ?Ext: No edema ? ?MS: Adequate  though reduced ROM spine, shoulders, hips and knees. ? ?Skin: Intact, no ulcerations or rash noted. ? ?Psych: Good eye contact, normal affect. Memory intact not anxious or depressed appearing. ? ?CNS: CN 2-12 intact, power,  normal throughout.no focal deficits noted. ? ? ?Assessment & Plan ? ?Essential hypertension ?Controlled, no change in medication ?DASH diet and commitment to daily physical activity for a minimum of 30 minutes discussed and  encouraged, as a part of hypertension management. ?The importance of attaining a healthy weight is also discussed. ? ? ?  07/04/2021  ?  1:54 PM 12/26/2020  ?  1:51 PM 12/26/2020  ? 10:31 AM 11/28/2020  ?  1:10 PM 11/13/2020  ? 10:07 AM 04/29/2020  ?  1:53 PM 04/18/2020  ?  2:51 PM  ?BP/Weight  ?Systolic BP 784  696 295 284 107 124  ?Diastolic BP 75  84 76 71 90 70  ?Wt. (Lbs) 198 197 197 194.08 195.12 196.25 193  ?BMI 31.01 kg/m2 30.85 kg/m2 30.85 kg/m2 31.33 kg/m2 30.56 kg/m2 30.74 kg/m2 30.23 kg/m2  ? ? ? ? ? ?Hyperlipidemia ?Hyperlipidemia:Low fat diet discussed and encouraged. ? ? ?Lipid Panel  ?Lab Results  ?Component Value Date  ? CHOL 149 07/04/2021  ? HDL 53 07/04/2021  ? [place] 78 07/04/2021  ? TRIG 96 07/04/2021  ? CHOLHDL 2.8 07/04/2021  ? ? ? ?Controlled, no change in medication ? ? ?Depression with anxiety ?Controlled, no change in medication ? ? ?Goiter ? [place] to furher eval ? ?Obesity (BMI 30.0-34.9) ? ?Patient re-educated about  the importance of commitment to a  minimum of 150 minutes of exercise per week as able. ? ?The importance of healthy food choices with portion control discussed, as well as eating regularly and within a 12 hour window most days. ?The need to choose "clean , green" food 50 to 75% of the time is discussed, as well as to make water the primary drink and set a goal of 64  ounces water daily. ? ?  ? ?  07/04/2021  ?  1:54 PM 12/26/2020  ?  1:51 PM 12/26/2020  ? 10:31 AM  ?Weight /BMI  ?Weight 198 lb 197 lb 197 lb  ?Height '5\' 7"'$  (1.702 m) '5\' 7"'$  (1.702 m) '5\' 7"'$  (1.702 m)  ?BMI 31.01 kg/m2 30.85 kg/m2 30.85 kg/m2  ? ? ? ? ?Unsteady gait ?Home safety and continued use of assistive device discussed ? ?

## 2021-07-06 NOTE — Assessment & Plan Note (Signed)
Controlled, no change in medication  

## 2021-07-06 NOTE — Assessment & Plan Note (Signed)
Hyperlipidemia:Low fat diet discussed and encouraged. ? ? ?Lipid Panel  ?Lab Results  ?Component Value Date  ? CHOL 149 07/04/2021  ? HDL 53 07/04/2021  ? [place] 78 07/04/2021  ? TRIG 96 07/04/2021  ? CHOLHDL 2.8 07/04/2021  ? ? ? ?Controlled, no change in medication ? ?

## 2021-07-06 NOTE — Assessment & Plan Note (Signed)
[place] to furher eval ?

## 2021-07-15 ENCOUNTER — Ambulatory Visit (HOSPITAL_COMMUNITY)
Admission: RE | Admit: 2021-07-15 | Discharge: 2021-07-15 | Disposition: A | Discharge status: Home / Self Care | Payer: Medicare Other | Source: Ambulatory Visit | Attending: Family Medicine | Admitting: Family Medicine

## 2021-07-15 DIAGNOSIS — E049 Nontoxic goiter, unspecified: Secondary | ICD-10-CM | POA: Insufficient documentation

## 2021-08-19 ENCOUNTER — Other Ambulatory Visit: Payer: Self-pay | Admitting: Family Medicine

## 2021-08-19 MED ORDER — PROMETHAZINE-DM 6.25-15 MG/5ML PO SYRP: ORAL_SOLUTION | ORAL | 0 refills | Status: DC

## 2021-09-05 ENCOUNTER — Ambulatory Visit (HOSPITAL_COMMUNITY)
Admission: RE | Admit: 2021-09-05 | Discharge: 2021-09-05 | Disposition: A | Discharge status: Home / Self Care | Payer: Medicare Other | Source: Ambulatory Visit | Attending: Family Medicine | Admitting: Family Medicine

## 2021-09-05 DIAGNOSIS — Z1231 Encounter for screening mammogram for malignant neoplasm of breast: Secondary | ICD-10-CM | POA: Diagnosis present

## 2021-09-10 ENCOUNTER — Other Ambulatory Visit: Payer: Self-pay | Admitting: Family Medicine

## 2021-10-03 ENCOUNTER — Other Ambulatory Visit: Payer: Self-pay | Admitting: Family Medicine

## 2021-10-16 ENCOUNTER — Other Ambulatory Visit: Payer: Self-pay | Admitting: Family Medicine

## 2021-11-28 ENCOUNTER — Other Ambulatory Visit: Payer: Self-pay | Admitting: Family Medicine

## 2021-12-10 ENCOUNTER — Ambulatory Visit (INDEPENDENT_AMBULATORY_CARE_PROVIDER_SITE_OTHER): Payer: Medicare Other

## 2021-12-10 DIAGNOSIS — Z23 Encounter for immunization: Secondary | ICD-10-CM

## 2022-01-07 ENCOUNTER — Ambulatory Visit (INDEPENDENT_AMBULATORY_CARE_PROVIDER_SITE_OTHER): Payer: Medicare Other | Admitting: Family Medicine

## 2022-01-07 ENCOUNTER — Encounter: Payer: Self-pay | Admitting: Family Medicine

## 2022-01-07 VITALS — BP 122/85 | HR 88 | Ht 67.0 in | Wt 198.0 lb

## 2022-01-07 DIAGNOSIS — Z0001 Encounter for general adult medical examination with abnormal findings: Secondary | ICD-10-CM

## 2022-01-07 DIAGNOSIS — E782 Mixed hyperlipidemia: Secondary | ICD-10-CM | POA: Diagnosis not present

## 2022-01-07 DIAGNOSIS — E559 Vitamin D deficiency, unspecified: Secondary | ICD-10-CM | POA: Diagnosis not present

## 2022-01-07 DIAGNOSIS — I10 Essential (primary) hypertension: Secondary | ICD-10-CM | POA: Diagnosis not present

## 2022-01-07 NOTE — Patient Instructions (Addendum)
F/U in 6 months, call if you need me sooner  No med changes, except please start caltrate with D one twice daily, this is a part of osteoporosis treatment  Plan on bone density in the [place]   Fasting lipid, cmp and egFR, Vit D first week in January  It is important that you exercise regularly at least 30 minutes 5 times a week. If you develop chest pain, have severe difficulty breathing, or feel very tired, stop exercising immediately and seek medical attention   Cut back on ice cream and chips as able please  Thanks for choosing [facility], we consider it a privelige to serve you.

## 2022-01-09 ENCOUNTER — Encounter: Payer: Self-pay | Admitting: Family Medicine

## 2022-01-09 DIAGNOSIS — Z0001 Encounter for general adult medical examination with abnormal findings: Secondary | ICD-10-CM | POA: Insufficient documentation

## 2022-01-09 NOTE — Assessment & Plan Note (Signed)

## 2022-01-09 NOTE — Progress Notes (Signed)
    Theresa Garcia     MRN: [id number]      DOB: [birth year]  HPI: Patient is in for annual physical exam. No other health concerns are expressed or addressed at the visit. Recent labs,  are reviewed. Immunization is reviewed , and  updated if needed.   PE: BP 122/85 (BP Location: Right Arm, Patient Position: Sitting, Cuff Size: Large)   Pulse 88   Ht '5\' 7"'$  (1.702 m)   Wt 198 lb 0.6 oz (89.8 kg)   SpO2 90%   BMI 31.02 kg/m   Pleasant  female, alert and oriented x 3, in no cardio-pulmonary distress. Afebrile. HEENT No facial trauma or asymetry. Sinuses non tender.  Extra occullar muscles intact.. External ears normal, . Neck: supple, no adenopathy,JVD .No bruits.Goiter   Chest: Clear to ascultation bilaterally.No crackles or wheezes. Non tender to palpation  Cardiovascular system; Heart sounds normal,  S1 and  S2 ,no S3.  No murmur, or thrill. Apical beat not displaced Peripheral pulses normal.  Abdomen: Soft, non tender  Musculoskeletal exam: Decreased  ROM of spine, hips , shoulders and knees. No deformity ,swelling or crepitus noted. No muscle wasting or atrophy.   Neurologic: Cranial nerves 2 to 12 intact. Power, tone ,sensation  normal throughout.  disturbance in gait. No tremor.  Skin: Intact, no ulceration, erythema , scaling or rash noted. Pigmentation normal throughout  Psych; Flat  mood and affect.  Assessment & Plan:  Encounter for Medicare annual examination with abnormal findings Annual exam as documented. Counseling done  re healthy lifestyle involving commitment to 150 minutes exercise per week, heart healthy diet, and attaining healthy weight.The importance of adequate sleep also discussed. Regular seat belt use and home safety, is also discussed. Changes in health habits are decided on by the patient with goals and time frames  set for achieving them. Immunization and cancer screening needs are specifically addressed at this visit.

## 2022-01-17 ENCOUNTER — Other Ambulatory Visit: Payer: Self-pay | Admitting: Family Medicine

## 2022-01-25 ENCOUNTER — Other Ambulatory Visit: Payer: Self-pay | Admitting: Family Medicine

## 2022-01-26 ENCOUNTER — Ambulatory Visit (INDEPENDENT_AMBULATORY_CARE_PROVIDER_SITE_OTHER): Payer: Medicare Other | Admitting: Internal Medicine

## 2022-01-26 ENCOUNTER — Encounter: Payer: Self-pay | Admitting: Internal Medicine

## 2022-01-26 VITALS — BP 140/82 | HR 83 | Ht 67.0 in | Wt 216.4 lb

## 2022-01-26 DIAGNOSIS — E8779 Other fluid overload: Secondary | ICD-10-CM | POA: Diagnosis not present

## 2022-01-26 DIAGNOSIS — I4891 Unspecified atrial fibrillation: Secondary | ICD-10-CM | POA: Insufficient documentation

## 2022-01-26 DIAGNOSIS — I509 Heart failure, unspecified: Secondary | ICD-10-CM

## 2022-01-26 DIAGNOSIS — E8770 Fluid overload, unspecified: Secondary | ICD-10-CM | POA: Insufficient documentation

## 2022-01-26 MED ORDER — METOPROLOL TARTRATE 25 MG PO TABS: 12.5000 mg | ORAL_TABLET | Freq: Two times a day (BID) | ORAL | 2 refills | Status: DC

## 2022-01-26 MED ORDER — FUROSEMIDE 40 MG PO TABS: 40.0000 mg | ORAL_TABLET | Freq: Two times a day (BID) | ORAL | 0 refills | Status: DC

## 2022-01-26 NOTE — Progress Notes (Signed)
Acute Office Visit  Subjective:     Patient ID: Theresa Garcia, female    DOB: 09/08/1940, 81 y.o.   MRN: [id number]  Chief Complaint  Patient presents with   Shortness of Breath    Started on 01/23/2022   Edema    In both legs   Theresa Garcia presents for an acute visit today for shortness of breath and bilateral lower extremity edema.  She reports onset of symptoms on Friday 12/1 that have progressively worsened over the weekend.  Theresa Garcia endorses dyspnea with exertion, orthopnea, and bilateral lower extremity edema.  Her brother reports that her edema was worse yesterday and has slightly improved but remains today.  She denies fever/chills, recent sick contacts, and cough or sputum production.  She has not taken any specific medications for symptom relief.  Review of Systems  Respiratory:  Positive for shortness of breath and wheezing. Negative for cough and sputum production.   Cardiovascular:  Positive for orthopnea, leg swelling and PND. Negative for chest pain.  All other systems reviewed and are negative.     Objective:    BP (!) 140/82   Pulse 83   Ht '5\' 7"'$  (1.702 m)   Wt 216 lb 6.4 oz (98.2 kg)   SpO2 93%   BMI 33.89 kg/m  BP Readings from Last 3 Encounters:  01/26/22 (!) 140/82  01/07/22 122/85  07/04/21 119/75   Physical Exam Vitals reviewed.  Constitutional:      General: She is not in acute distress.    Appearance: Normal appearance. She is obese. She is not toxic-appearing.     Comments: Examined in wheelchair  HENT:     Head: Normocephalic and atraumatic.     Right Ear: External ear normal.     Left Ear: External ear normal.     Nose: Nose normal. No congestion or rhinorrhea.     Mouth/Throat:     Mouth: Mucous membranes are moist.     Pharynx: Oropharynx is clear. No oropharyngeal exudate or posterior oropharyngeal erythema.  Eyes:     General: No scleral icterus.    Extraocular Movements: Extraocular movements intact.      Conjunctiva/sclera: Conjunctivae normal.     Pupils: Pupils are equal, round, and reactive to light.  Cardiovascular:     Rate and Rhythm: Normal rate. Rhythm irregular.     Pulses: Normal pulses.     Heart sounds: Normal heart sounds. No murmur heard.    No friction rub. No gallop.  Pulmonary:     Effort: Pulmonary effort is normal.     Breath sounds: Wheezing (upper airway bilaterally) present.     Comments: Bibasilar crackles Abdominal:     General: Abdomen is flat. Bowel sounds are normal. There is no distension.     Palpations: Abdomen is soft.     Tenderness: There is no abdominal tenderness.  Musculoskeletal:        General: No swelling. Normal range of motion.     Cervical back: Normal range of motion.     Right lower leg: Edema (2+) present.     Left lower leg: Edema (2+) present.  Lymphadenopathy:     Cervical: No cervical adenopathy.  Skin:    General: Skin is warm and dry.     Capillary Refill: Capillary refill takes less than 2 seconds.     Coloration: Skin is not jaundiced.  Neurological:     General: No focal deficit present.     Mental Status:  She is alert and oriented to person, place, and time.  Psychiatric:        Mood and Affect: Mood normal.        Behavior: Behavior normal.       Assessment & Plan:   Problem List Items Addressed This Visit    Hypervolemia Presents today for evaluation of dyspnea on exertion, orthopnea, and bilateral lower extremity edema.  Symptoms have been present x3 days.  On exam she has 2+ bilateral lower extremity edema and bibasilar crackles.  She has had previous echocardiogram from January 2019 showing normal EF.  She is volume overloaded on exam today and heartbeat is irregular.  I have ordered an ECG, which shows atrial fibrillation. I do not see a previously documented history of Afib.  She has a history of bilateral PE in the setting of malignancy and is currently prescribed Eliquis, with which she has been compliant.  She is  hemodynamically stable today. -Start metoprolol 12.5 mg BID -I have prescribed Lasix 40 mg twice daily for treatment of volume overload. This can be de-escalated to once daily and then discontinued pending symptomatic / clinical improvement -Labs ordered today (CBC, BMP, BNP) -I have  placed a referral to cardiology for her to establish care -She will follow-up with Dr. Moshe Cipro in 2 weeks, however I have instructed Theresa Garcia and her brother that she should present to the emergency department if her symptoms worsen.   Atrial fibrillation, unspecified type [facility]) Presenting today for evaluation of the symptoms endorsed above. On exam she has irregularly irregular heart rate and rhythm. ECG shows atrial fibrillation. No previously documented history of Afib. She is currently on Eliquis due to a history of bilateral PE (2019). She is hemodynamically stable, HR 80s. -Start metoprolol 12.5 mg BID -Continue Eliquis -Cardiology referral placed today   Meds ordered this encounter  Medications   furosemide (LASIX) 40 MG tablet    Sig: Take 1 tablet (40 mg total) by mouth 2 (two) times daily for 60 doses.    Dispense:  60 tablet    Refill:  0   metoprolol tartrate (LOPRESSOR) 25 MG tablet    Sig: Take 0.5 tablets (12.5 mg total) by mouth 2 (two) times daily.    Dispense:  30 tablet    Refill:  2    Return in about 2 weeks (around 02/09/2022).  Johnette Abraham, MD

## 2022-01-26 NOTE — Assessment & Plan Note (Signed)
Presenting today for evaluation of the symptoms endorsed above. On exam she has irregularly irregular heart rate and rhythm. ECG shows atrial fibrillation. No previously documented history of Afib. She is currently on Eliquis due to a history of bilateral PE (2019). She is hemodynamically stable, HR 80s. -Start metoprolol 12.5 mg BID -Continue Eliquis -Cardiology referral placed today

## 2022-01-26 NOTE — Assessment & Plan Note (Addendum)
Presents today for evaluation of dyspnea on exertion, orthopnea, and bilateral lower extremity edema.  Symptoms have been present x3 days.  On exam she has 2+ bilateral lower extremity edema and bibasilar crackles.  She has had previous echocardiogram from January 2019 showing normal EF.  She is volume overloaded on exam today and heartbeat is irregular.  I have ordered an ECG, which shows atrial fibrillation. I do not see a previously documented history of Afib.  She has a history of bilateral PE in the setting of malignancy and is currently prescribed Eliquis, with which she has been compliant.  She is hemodynamically stable today. -Start metoprolol 12.5 mg BID -I have prescribed Lasix 40 mg twice daily for treatment of volume overload. This can be de-escalated to once daily and then discontinued pending symptomatic / clinical improvement -Labs ordered today (CBC, BMP, BNP) -I have  placed a referral to cardiology for her to establish care -She will follow-up with Dr. Moshe Cipro in 2 weeks, however I have instructed Ms. Hauge and her brother that she should present to the emergency department if her symptoms worsen.

## 2022-01-26 NOTE — Patient Instructions (Signed)
It was a pleasure to see you today.  Thank you for giving [place] the opportunity to be involved in your care.  Below is a brief recap of your visit and next steps.  We will plan to see you again in 2 weeks.  Summary Start lasix 40 mg twice daily for heart failure exacerbation Follow up in 2 weeks

## 2022-01-27 ENCOUNTER — Inpatient Hospital Stay (HOSPITAL_COMMUNITY)
Admission: EM | Admit: 2022-01-27 | Discharge: 2022-01-30 | DRG: 291 | Disposition: A | Discharge status: Home / Self Care | Payer: Medicare Other | Attending: Family Medicine | Admitting: Family Medicine

## 2022-01-27 ENCOUNTER — Emergency Department (HOSPITAL_COMMUNITY): Payer: Medicare Other

## 2022-01-27 ENCOUNTER — Other Ambulatory Visit: Payer: Self-pay

## 2022-01-27 ENCOUNTER — Encounter (HOSPITAL_COMMUNITY): Payer: Self-pay

## 2022-01-27 ENCOUNTER — Telehealth: Payer: Self-pay | Admitting: Family Medicine

## 2022-01-27 DIAGNOSIS — Z87891 Personal history of nicotine dependence: Secondary | ICD-10-CM

## 2022-01-27 DIAGNOSIS — E876 Hypokalemia: Secondary | ICD-10-CM | POA: Diagnosis not present

## 2022-01-27 DIAGNOSIS — G8929 Other chronic pain: Secondary | ICD-10-CM | POA: Diagnosis present

## 2022-01-27 DIAGNOSIS — F419 Anxiety disorder, unspecified: Secondary | ICD-10-CM | POA: Diagnosis present

## 2022-01-27 DIAGNOSIS — Z8249 Family history of ischemic heart disease and other diseases of the circulatory system: Secondary | ICD-10-CM

## 2022-01-27 DIAGNOSIS — F32A Depression, unspecified: Secondary | ICD-10-CM | POA: Diagnosis present

## 2022-01-27 DIAGNOSIS — F418 Other specified anxiety disorders: Secondary | ICD-10-CM | POA: Diagnosis not present

## 2022-01-27 DIAGNOSIS — Z86711 Personal history of pulmonary embolism: Secondary | ICD-10-CM | POA: Diagnosis not present

## 2022-01-27 DIAGNOSIS — I493 Ventricular premature depolarization: Secondary | ICD-10-CM | POA: Diagnosis present

## 2022-01-27 DIAGNOSIS — I4891 Unspecified atrial fibrillation: Secondary | ICD-10-CM | POA: Diagnosis present

## 2022-01-27 DIAGNOSIS — B009 Herpesviral infection, unspecified: Secondary | ICD-10-CM | POA: Diagnosis present

## 2022-01-27 DIAGNOSIS — I340 Nonrheumatic mitral (valve) insufficiency: Secondary | ICD-10-CM | POA: Diagnosis not present

## 2022-01-27 DIAGNOSIS — Z90710 Acquired absence of both cervix and uterus: Secondary | ICD-10-CM | POA: Diagnosis not present

## 2022-01-27 DIAGNOSIS — J9811 Atelectasis: Secondary | ICD-10-CM | POA: Diagnosis present

## 2022-01-27 DIAGNOSIS — Z1152 Encounter for screening for COVID-19: Secondary | ICD-10-CM

## 2022-01-27 DIAGNOSIS — I110 Hypertensive heart disease with heart failure: Principal | ICD-10-CM | POA: Diagnosis present

## 2022-01-27 DIAGNOSIS — I10 Essential (primary) hypertension: Secondary | ICD-10-CM | POA: Diagnosis not present

## 2022-01-27 DIAGNOSIS — Z923 Personal history of irradiation: Secondary | ICD-10-CM

## 2022-01-27 DIAGNOSIS — I5031 Acute diastolic (congestive) heart failure: Secondary | ICD-10-CM | POA: Diagnosis not present

## 2022-01-27 DIAGNOSIS — I081 Rheumatic disorders of both mitral and tricuspid valves: Secondary | ICD-10-CM | POA: Diagnosis present

## 2022-01-27 DIAGNOSIS — Z7983 Long term (current) use of bisphosphonates: Secondary | ICD-10-CM

## 2022-01-27 DIAGNOSIS — Z79899 Other long term (current) drug therapy: Secondary | ICD-10-CM | POA: Diagnosis not present

## 2022-01-27 DIAGNOSIS — Z8701 Personal history of pneumonia (recurrent): Secondary | ICD-10-CM

## 2022-01-27 DIAGNOSIS — I5033 Acute on chronic diastolic (congestive) heart failure: Secondary | ICD-10-CM | POA: Diagnosis present

## 2022-01-27 DIAGNOSIS — Z7901 Long term (current) use of anticoagulants: Secondary | ICD-10-CM | POA: Diagnosis not present

## 2022-01-27 DIAGNOSIS — I509 Heart failure, unspecified: Secondary | ICD-10-CM

## 2022-01-27 DIAGNOSIS — I2782 Chronic pulmonary embolism: Secondary | ICD-10-CM | POA: Diagnosis present

## 2022-01-27 DIAGNOSIS — Z853 Personal history of malignant neoplasm of breast: Secondary | ICD-10-CM

## 2022-01-27 DIAGNOSIS — Z87442 Personal history of urinary calculi: Secondary | ICD-10-CM | POA: Diagnosis not present

## 2022-01-27 DIAGNOSIS — Z880 Allergy status to penicillin: Secondary | ICD-10-CM

## 2022-01-27 DIAGNOSIS — Z9221 Personal history of antineoplastic chemotherapy: Secondary | ICD-10-CM

## 2022-01-27 DIAGNOSIS — Z888 Allergy status to other drugs, medicaments and biological substances status: Secondary | ICD-10-CM

## 2022-01-27 DIAGNOSIS — E785 Hyperlipidemia, unspecified: Secondary | ICD-10-CM | POA: Diagnosis present

## 2022-01-27 HISTORY — DX: Heart failure, unspecified: I50.9

## 2022-01-27 LAB — CBC WITH DIFFERENTIAL/PLATELET
Abs Immature Granulocytes: 0.02 10*3/uL (ref 0.00–0.07)
Basophils Absolute: 0 10*3/uL (ref 0.0–0.1)
Basophils Relative: 0 %
Eosinophils Absolute: 0 10*3/uL (ref 0.0–0.5)
Eosinophils Relative: 0 %
HCT: 36 % (ref 36.0–46.0)
Hemoglobin: 11.4 g/dL — ABNORMAL LOW (ref 12.0–15.0)
Immature Granulocytes: 0 %
Lymphocytes Relative: 18 %
Lymphs Abs: 1.5 10*3/uL (ref 0.7–4.0)
MCH: 31.9 pg (ref 26.0–34.0)
MCHC: 31.7 g/dL (ref 30.0–36.0)
MCV: 100.8 fL — ABNORMAL HIGH (ref 80.0–100.0)
Monocytes Absolute: 0.5 10*3/uL (ref 0.1–1.0)
Monocytes Relative: 6 %
Neutro Abs: 6.6 10*3/uL (ref 1.7–7.7)
Neutrophils Relative %: 76 %
Platelets: 243 10*3/uL (ref 150–400)
RBC: 3.57 MIL/uL — ABNORMAL LOW (ref 3.87–5.11)
RDW: 14.6 % (ref 11.5–15.5)
WBC: 8.7 10*3/uL (ref 4.0–10.5)
nRBC: 0 % (ref 0.0–0.2)

## 2022-01-27 LAB — COMPREHENSIVE METABOLIC PANEL
ALT: 30 U/L (ref 0–44)
AST: 37 U/L (ref 15–41)
Albumin: 3.5 g/dL (ref 3.5–5.0)
Alkaline Phosphatase: 79 U/L (ref 38–126)
Anion gap: 10 (ref 5–15)
BUN: 18 mg/dL (ref 8–23)
CO2: 20 mmol/L — ABNORMAL LOW (ref 22–32)
Calcium: 9.2 mg/dL (ref 8.9–10.3)
Chloride: 109 mmol/L (ref 98–111)
Creatinine, Ser: 1.11 mg/dL — ABNORMAL HIGH (ref 0.44–1.00)
GFR, Estimated: 51 mL/min — ABNORMAL LOW (ref >60)
Glucose, Bld: 126 mg/dL — ABNORMAL HIGH (ref 70–99)
Potassium: 3.9 mmol/L (ref 3.5–5.1)
Sodium: 139 mmol/L (ref 135–145)
Total Bilirubin: 0.9 mg/dL (ref 0.3–1.2)
Total Protein: 7.1 g/dL (ref 6.5–8.1)

## 2022-01-27 LAB — TROPONIN I (HIGH SENSITIVITY)
Troponin I (High Sensitivity): 10 ng/L (ref <18)
Troponin I (High Sensitivity): 10 ng/L (ref <18)

## 2022-01-27 LAB — RESP PANEL BY RT-PCR (FLU A&B, COVID) ARPGX2
Influenza A by PCR: NEGATIVE
Influenza B by PCR: NEGATIVE
SARS Coronavirus 2 by RT PCR: NEGATIVE

## 2022-01-27 LAB — BRAIN NATRIURETIC PEPTIDE: B Natriuretic Peptide: 819 pg/mL — ABNORMAL HIGH (ref 0.0–100.0)

## 2022-01-27 LAB — MAGNESIUM: Magnesium: 1.8 mg/dL (ref 1.7–2.4)

## 2022-01-27 LAB — TSH: TSH: 0.557 u[IU]/mL (ref 0.350–4.500)

## 2022-01-27 MED ORDER — FLUOXETINE HCL 10 MG PO CAPS
10.0000 mg | ORAL_CAPSULE | Freq: Every day | ORAL | Status: DC
  Administered 2022-01-27 – 2022-01-30 (×4): 10 mg via ORAL
  Filled 2022-01-27 (×4): qty 1

## 2022-01-27 MED ORDER — ONDANSETRON HCL 4 MG/2ML IJ SOLN: 4.0000 mg | Freq: Four times a day (QID) | INTRAMUSCULAR | Status: DC | PRN

## 2022-01-27 MED ORDER — ACETAMINOPHEN 325 MG PO TABS: 650.0000 mg | ORAL_TABLET | Freq: Four times a day (QID) | ORAL | Status: DC | PRN

## 2022-01-27 MED ORDER — FUROSEMIDE 10 MG/ML IJ SOLN
40.0000 mg | Freq: Two times a day (BID) | INTRAMUSCULAR | Status: DC
  Administered 2022-01-27 – 2022-01-29 (×5): 40 mg via INTRAVENOUS
  Filled 2022-01-27 (×6): qty 4

## 2022-01-27 MED ORDER — ACETAMINOPHEN 650 MG RE SUPP: 650.0000 mg | Freq: Four times a day (QID) | RECTAL | Status: DC | PRN

## 2022-01-27 MED ORDER — IOHEXOL 350 MG/ML SOLN
75.0000 mL | Freq: Once | INTRAVENOUS | Status: AC | PRN
  Administered 2022-01-27: 75 mL via INTRAVENOUS

## 2022-01-27 MED ORDER — ONDANSETRON HCL 4 MG PO TABS: 4.0000 mg | ORAL_TABLET | Freq: Four times a day (QID) | ORAL | Status: DC | PRN

## 2022-01-27 MED ORDER — ATORVASTATIN CALCIUM 20 MG PO TABS
20.0000 mg | ORAL_TABLET | Freq: Every evening | ORAL | Status: DC
  Administered 2022-01-27 – 2022-01-29 (×3): 20 mg via ORAL
  Filled 2022-01-27 (×2): qty 1
  Filled 2022-01-27: qty 2

## 2022-01-27 MED ORDER — APIXABAN 5 MG PO TABS
5.0000 mg | ORAL_TABLET | Freq: Two times a day (BID) | ORAL | Status: DC
  Administered 2022-01-27 – 2022-01-30 (×6): 5 mg via ORAL
  Filled 2022-01-27 (×6): qty 1

## 2022-01-27 MED ORDER — ACYCLOVIR 800 MG PO TABS
400.0000 mg | ORAL_TABLET | Freq: Two times a day (BID) | ORAL | Status: DC
  Administered 2022-01-27 – 2022-01-30 (×6): 400 mg via ORAL
  Filled 2022-01-27 (×6): qty 1

## 2022-01-27 MED ORDER — POLYETHYLENE GLYCOL 3350 17 G PO PACK: 17.0000 g | PACK | Freq: Every day | ORAL | Status: DC | PRN

## 2022-01-27 MED ORDER — ALBUTEROL SULFATE (2.5 MG/3ML) 0.083% IN NEBU
2.5000 mg | INHALATION_SOLUTION | Freq: Once | RESPIRATORY_TRACT | Status: AC
  Administered 2022-01-27: 2.5 mg via RESPIRATORY_TRACT
  Filled 2022-01-27: qty 3

## 2022-01-27 MED ORDER — TEMAZEPAM 15 MG PO CAPS
30.0000 mg | ORAL_CAPSULE | Freq: Every evening | ORAL | Status: DC | PRN
  Administered 2022-01-27 – 2022-01-29 (×3): 30 mg via ORAL
  Filled 2022-01-27 (×3): qty 2

## 2022-01-27 NOTE — Assessment & Plan Note (Addendum)
New atrial fibrillation diagnosed -yesterday 12/4.  Ready on chronic anticoagulation for prior PE diagnosis.  Currently rate controlled. Was started on metoprolol 12.5 mg twice daily yesterday.  Heart rates ranging from 54-67. -Magnesium 1.8, TSH 0.557 both WNL -Troponin 10 , trend -Hold off on further beta-blockers for now -Continue anticoagulation with Eliquis reports compliance

## 2022-01-27 NOTE — Assessment & Plan Note (Signed)
Resume Eliquis 

## 2022-01-27 NOTE — ED Triage Notes (Signed)
Pt BIBA from home. Pt c/o SHOB. Pt had upper lobe wheezing on EMS arrival. Pt was giving 2.5 of albuterol, which improved breathing/wheezing.  Pt was dx with a fib 2 days ago. Recently started new meds.   118/82 96% RA

## 2022-01-27 NOTE — Assessment & Plan Note (Signed)
Resume Prozac, Temazepam

## 2022-01-27 NOTE — Assessment & Plan Note (Addendum)
Unspecified congestive heart failure type.  Bilateral lower extremity swelling, 16 pound weight gain, elevated at 819.  In the setting of new onset A-fib diagnosis.  Last echo from 2019 shows EF of 55 to 60%.  CTA chest negative for PE, shows small right-sided pleural effusion findings suggestive of right heart dysfunction -Obtain updated echocardiogram, check PA pressures -IV Lasix 40 twice daily -Check input output, daily weights, daily BMP -Troponin 10, trend - Albuterol nebs x 1

## 2022-01-27 NOTE — Assessment & Plan Note (Signed)
Stable. -Hold amlodipine- benazepril 5- 40 - To allow for diuresis and also with contrast exposure

## 2022-01-27 NOTE — Telephone Encounter (Signed)
Family reports weakness, difficulty breathing and had to sit up fpor 3 hrs last night, despite attempts at out pt diuresis 1 day ago with oral lasix Family will take her to the [facility], they feel ambulance needed as she is so weak, I have called ED doc as well

## 2022-01-27 NOTE — H&P (Signed)
History and Physical    Theresa Garcia UUV:[id number] DOB: [birth year] DOA: 01/27/2022  PCP: Fayrene Helper, MD   Patient coming from: Home  I have personally briefly reviewed patient's old medical records in [place]  Chief Complaint: Difficulty breathing  HPI: Theresa Garcia is a 81 y.o. female with medical history significant for atrial fibrillation, pulm embolism on chronic anticoagulation, hypertension, breast cancer, HSV, anxiety and depression. Patient was referred to the ED by her outpatient provider after she called complaining of difficulty breathing and 16 pound weight gain.  Patient was seen as outpatient yesterday 12/4 and diagnosed with atrial fibrillation.  She is on chronic anticoagulation, started on metoprolol 12.5 twice daily and Lasix 40 mg twice daily.  She started these medications yesterday. She reports difficulty breathing with exertion and bilateral lower extremity swelling that started 2 days ago.  Reports dry cough and wheezing also.  She reports right upper chest pain- more around her shoulder and back, worse with movements of her upper extremities and improves with rest.   She has not smoked any cigarettes in at least 50 years.  Reports compliance with her Eliquis.  ED Course: Heart rate 53-67.  Respirate rate 16-26.  Blood pressure 474-259 systolic.  O2 sats 92 to 94% on room air.  BNP elevated at  819. Chest x-ray-small bilateral pleural effusions with overlying atelectasis. Subsequent CTA chest-no PE, shows small right-sided pleural effusion and findings suggestive of right heart dysfunction. Hospitalist admit for acute CHF.  Review of Systems: As per HPI all other systems reviewed and negative.  Past Medical History:  Diagnosis Date   Breast cancer ([place]) 02/24/2000   Bronchitis    Cancer (HCC)    Chronic back pain    Depression    GERD (gastroesophageal reflux disease)    Hyperlipidemia    Hypertension    Insomnia    Kidney stone     Personal history of chemotherapy    Personal history of radiation therapy    Pneumonia    Vertigo     Past Surgical History:  Procedure Laterality Date   ABDOMINAL HYSTERECTOMY     BREAST LUMPECTOMY Left 2002   BREAST SURGERY     left   CYSTOSCOPY/RETROGRADE/URETEROSCOPY/STONE EXTRACTION WITH BASKET  01/07/2011   Procedure: CYSTOSCOPY/RETROGRADE/URETEROSCOPY/STONE EXTRACTION WITH BASKET;  Surgeon: Marissa Nestle;  Location: [facility];  Service: Urology;  Laterality: Left;  Balloon Dilatation; stone given to family per MD   TUBAL LIGATION       reports that she quit smoking about 41 years ago. Her smoking use included cigarettes. She has a 5.00 pack-year smoking history. She has never used smokeless tobacco. She reports that she does not drink alcohol and does not use drugs.  Allergies  Allergen Reactions   Meclizine Diarrhea   Penicillins Rash and Other (See Comments)    Has patient had a PCN reaction causing immediate rash, facial/tongue/throat swelling, SOB or lightheadedness with hypotension: no Has patient had a PCN reaction causing severe rash involving mucus membranes or skin necrosis: No Has patient had a PCN reaction that required hospitalization No Has patient had a PCN reaction occurring within the last 10 years: No If all of the above answers are "NO", then may proceed with Cephalosporin use.     Family History  Problem Relation Age of Onset   COPD Father    COPD Sister    COPD Sister    Diabetes Sister    Diabetes Brother  Seizures Mother    Bone cancer Brother    Heart disease Brother    Aneurysm Brother        brain   HIV/AIDS Brother     Prior to Admission medications   Medication Sig Start Date End Date Taking? Authorizing Provider  acetaminophen (TYLENOL) 500 MG tablet Take 1,000 mg by mouth every 6 (six) hours as needed for mild pain or moderate pain.    Yes [provider]  acyclovir (ZOVIRAX) 400 MG tablet TAKE 1 TABLET BY MOUTH TWICE   DAILY 01/19/22  Yes Fayrene Helper, MD  alendronate (FOSAMAX) 70 MG tablet TAKE 1 TABLET BY MOUTH EVERY 7  DAYS WITH A FULL GLASS OF WATER  ON AN EMPTY STOMACH 10/17/21  Yes Fayrene Helper, MD  amLODipine-benazepril (LOTREL) 5-40 MG capsule TAKE 1 CAPSULE BY MOUTH DAILY 01/26/22  Yes Fayrene Helper, MD  atorvastatin (LIPITOR) 20 MG tablet TAKE 1 TABLET EVERY EVENING 05/26/21  Yes Fayrene Helper, MD  Calcium Carb-Cholecalciferol (CALTRATE 600+D3) 600-20 MG-MCG TABS Take one tablet by mouth two times daily 01/07/22  Yes Fayrene Helper, MD  ELIQUIS 5 MG TABS tablet TAKE 1 TABLET BY MOUTH TWICE  DAILY 10/03/21  Yes Fayrene Helper, MD  FLUoxetine (PROZAC) 10 MG capsule TAKE 1 CAPSULE BY MOUTH DAILY 10/03/21  Yes Fayrene Helper, MD  furosemide (LASIX) 40 MG tablet Take 1 tablet (40 mg total) by mouth 2 (two) times daily for 60 doses. 01/26/22 02/25/22 Yes Johnette Abraham, MD  meclizine (ANTIVERT) 25 MG tablet TAKE 1 TABLET BY MOUTH THREE TIMES DAILY AS NEEDED FOR DIZZINESS 05/19/21  Yes Fayrene Helper, MD  metoprolol tartrate (LOPRESSOR) 25 MG tablet Take 0.5 tablets (12.5 mg total) by mouth 2 (two) times daily. 01/26/22 04/26/22 Yes Johnette Abraham, MD  temazepam (RESTORIL) 30 MG capsule Take 1 capsule (30 mg total) by mouth at bedtime as needed for sleep. 07/01/21  Yes Fayrene Helper, MD  vitamin C (ASCORBIC ACID) 500 MG tablet Take 500 mg by mouth daily.   Yes [provider]  Vitamin D, Ergocalciferol, (DRISDOL) 1.25 MG (50000 UNIT) CAPS capsule TAKE 1 CAPSULE BY MOUTH ONCE  WEEKLY 09/10/21  Yes Fayrene Helper, MD  Misc. Devices ([place]) MISC 1 each by Does not apply route daily as needed. 04/13/17   Raylene Everts, MD    Physical Exam: Vitals:   01/27/22 1300 01/27/22 1400 01/27/22 1430 01/27/22 1500  BP: 125/77 (!) 115/58 125/73 130/78  Pulse: (!) 56 67 (!) 54 (!) 54  Resp: (!) 22 (!) 22 18 (!) 26  Temp:      TempSrc:      SpO2: 93%  93% 92% 92%  Weight:      Height:        Constitutional: NAD, calm, comfortable Vitals:   01/27/22 1300 01/27/22 1400 01/27/22 1430 01/27/22 1500  BP: 125/77 (!) 115/58 125/73 130/78  Pulse: (!) 56 67 (!) 54 (!) 54  Resp: (!) 22 (!) 22 18 (!) 26  Temp:      TempSrc:      SpO2: 93% 93% 92% 92%  Weight:      Height:       Eyes: PERRL, lids and conjunctivae normal ENMT: Mucous membranes are moist.  Neck: normal, supple, no masses, no thyromegaly Respiratory: Mild, sparse expiratory wheezing Normal respiratory effort. No accessory muscle use.  Cardiovascular: Regular rate and rhythm, no murmurs / rubs /  gallops.  1+ pitting bilateral lower extremity edema to knees .  Lower extremities warm. Abdomen: no tenderness, no masses palpated. No hepatosplenomegaly. Bowel sounds positive.  Musculoskeletal: no clubbing / cyanosis. No joint deformity upper and lower extremities. Skin: no rashes, lesions, ulcers. No induration Neurologic: No apparent cranial nerve abnormality, moving all extremities spontaneously Psychiatric: Normal judgment and insight. Alert and oriented x 3. Normal mood.   Labs on Admission: I have personally reviewed following labs and imaging studies  CBC: Recent Labs  Lab 01/26/22 1026 01/27/22 1311  WBC 7.4 8.7  NEUTROABS 4.4 6.6  HGB 11.4 11.4*  HCT 35.2 36.0  MCV 98* 100.8*  PLT 255 825   Basic Metabolic Panel: Recent Labs  Lab 01/26/22 1026 01/27/22 1311  NA 142 139  K 4.2 3.9  CL 107* 109  CO2 19* 20*  GLUCOSE 87 126*  BUN 10 18  CREATININE 0.91 1.11*  CALCIUM 9.3 9.2  MG  --  1.8   GFR: Estimated Creatinine Clearance: 50.2 mL/min (A) (by C-G formula based on SCr of 1.11 mg/dL (H)). Liver Function Tests: Recent Labs  Lab 01/27/22 1311  AST 37  ALT 30  ALKPHOS 79  BILITOT 0.9  PROT 7.1  ALBUMIN 3.5   Thyroid Function Tests: Recent Labs    01/27/22 1311  TSH 0.557   Radiological Exams on Admission: DG Chest 2 View  Result Date:  01/27/2022 CLINICAL DATA:  Shortness of breath. EXAM: CHEST - 2 VIEW COMPARISON:  03/22/2017 FINDINGS: The cardiac silhouette, mediastinal hilar contours are within normal limits given the AP projection. Small bilateral pleural effusions with overlying atelectasis. No pulmonary edema or pulmonary infiltrates. IMPRESSION: Small bilateral pleural effusions with overlying atelectasis. Electronically Signed   By: Marijo Sanes M.D.   On: 01/27/2022 12:47    EKG: Independently reviewed.  Atrial fibrillation rate 59, QTc 435.  No significant ST or T wave change from prior EKG.  EKG from yesterday showing new A-fib diagnosis.  Assessment/Plan Principal Problem:   Acute congestive heart failure (HCC) Active Problems:   Essential hypertension   Depression with anxiety   Atrial fibrillation (HCC)   Hx of pulmonary embolus  Assessment and Plan: * Acute congestive heart failure (HCC) Unspecified congestive heart failure type.  Bilateral lower extremity swelling, 16 pound weight gain, elevated at 819.  In the setting of new onset A-fib diagnosis.  Last echo from 2019 shows EF of 55 to 60%.  CTA chest negative for PE, shows small right-sided pleural effusion findings suggestive of right heart dysfunction -Obtain updated echocardiogram, check PA pressures -IV Lasix 40 twice daily -Check input output, daily weights, daily BMP -Troponin 10, trend - Albuterol nebs x 1  Hx of pulmonary embolus Resume Eliquis  Atrial fibrillation ([place]) New atrial fibrillation diagnosed -yesterday 12/4.  Ready on chronic anticoagulation for prior PE diagnosis.  Currently rate controlled. Was started on metoprolol 12.5 mg twice daily yesterday.  Heart rates ranging from 54-67. -Magnesium 1.8, TSH 0.557 both WNL -Troponin 10 , trend -Hold off on further beta-blockers for now -Continue anticoagulation with Eliquis reports compliance  Depression with anxiety Resume Prozac, Temazepam  Essential hypertension Stable. -Hold  amlodipine- benazepril 5- 40 - To allow for diuresis and also with contrast exposure   DVT prophylaxis: Eliquis Code Status: FULL Family Communication: Sister Arnettie at bedside Disposition Plan: > 2 days Consults called: None Admission status:  Inpt tele I certify that at the point of admission it is my clinical judgment that the patient  will require inpatient hospital care spanning beyond 2 midnights from the point of admission due to high intensity of service, high risk for further deterioration and high frequency of surveillance required.    Author: Bethena Roys, MD 01/27/2022 5:00 PM  For on call review [email or web address].

## 2022-01-27 NOTE — ED Provider Notes (Signed)
[facility] EMERGENCY DEPARTMENT Provider Note   CSN: [id number] Arrival date & time: 01/27/22  1205     History  Chief Complaint  Patient presents with   Shortness of Breath    Theresa Garcia is a 81 y.o. female.   Shortness of Breath Patient with shortness of breath fatigue.  Reportedly had new onset diagnosis of A-fib couple days ago.  Had seen PCP.  Started on Lasix due to CHF.  Today got called by PCP and told to come to the ER.  I discussed with Dr. Moshe Cipro who stated that patient is up 16 pounds and will need admission.  Last echocardiogram was 4 years ago. Complaining of right upper chest pain.  Worse with certain movements.  No cough.  Reportedly did have some wheezing.  Is on Eliquis for previous pulmonary embolisms.  Patient has had difficulty walking up stairs.    Past Medical History:  Diagnosis Date   Breast cancer ([place]) 02/24/2000   Bronchitis    Cancer (HCC)    Chronic back pain    Depression    GERD (gastroesophageal reflux disease)    Hyperlipidemia    Hypertension    Insomnia    Kidney stone    Personal history of chemotherapy    Personal history of radiation therapy    Pneumonia    Vertigo     Home Medications Prior to Admission medications   Medication Sig Start Date End Date Taking? Authorizing Provider  acetaminophen (TYLENOL) 500 MG tablet Take 1,000 mg by mouth every 6 (six) hours as needed for mild pain or moderate pain.    Yes [provider]  acyclovir (ZOVIRAX) 400 MG tablet TAKE 1 TABLET BY MOUTH TWICE  DAILY 01/19/22  Yes Fayrene Helper, MD  alendronate (FOSAMAX) 70 MG tablet TAKE 1 TABLET BY MOUTH EVERY 7  DAYS WITH A FULL GLASS OF WATER  ON AN EMPTY STOMACH 10/17/21  Yes Fayrene Helper, MD  amLODipine-benazepril (LOTREL) 5-40 MG capsule TAKE 1 CAPSULE BY MOUTH DAILY 01/26/22  Yes Fayrene Helper, MD  atorvastatin (LIPITOR) 20 MG tablet TAKE 1 TABLET EVERY EVENING 05/26/21  Yes Fayrene Helper, MD  Calcium  Carb-Cholecalciferol (CALTRATE 600+D3) 600-20 MG-MCG TABS Take one tablet by mouth two times daily 01/07/22  Yes Fayrene Helper, MD  ELIQUIS 5 MG TABS tablet TAKE 1 TABLET BY MOUTH TWICE  DAILY 10/03/21  Yes Fayrene Helper, MD  FLUoxetine (PROZAC) 10 MG capsule TAKE 1 CAPSULE BY MOUTH DAILY 10/03/21  Yes Fayrene Helper, MD  furosemide (LASIX) 40 MG tablet Take 1 tablet (40 mg total) by mouth 2 (two) times daily for 60 doses. 01/26/22 02/25/22 Yes Johnette Abraham, MD  meclizine (ANTIVERT) 25 MG tablet TAKE 1 TABLET BY MOUTH THREE TIMES DAILY AS NEEDED FOR DIZZINESS 05/19/21  Yes Fayrene Helper, MD  metoprolol tartrate (LOPRESSOR) 25 MG tablet Take 0.5 tablets (12.5 mg total) by mouth 2 (two) times daily. 01/26/22 04/26/22 Yes Johnette Abraham, MD  temazepam (RESTORIL) 30 MG capsule Take 1 capsule (30 mg total) by mouth at bedtime as needed for sleep. 07/01/21  Yes Fayrene Helper, MD  vitamin C (ASCORBIC ACID) 500 MG tablet Take 500 mg by mouth daily.   Yes [provider]  Vitamin D, Ergocalciferol, (DRISDOL) 1.25 MG (50000 UNIT) CAPS capsule TAKE 1 CAPSULE BY MOUTH ONCE  WEEKLY 09/10/21  Yes Fayrene Helper, MD  Misc. Devices ([place]) [place] 1 each by  Does not apply route daily as needed. 04/13/17   Raylene Everts, MD      Allergies    Meclizine and Penicillins    Review of Systems   Review of Systems  Respiratory:  Positive for shortness of breath.     Physical Exam Updated Vital Signs BP 130/78   Pulse (!) 54   Temp 97.8 F (36.6 C) (Oral)   Resp (!) 26   Ht '5\' 7"'$  (1.702 m)   Wt 98 kg   SpO2 92%   BMI 33.83 kg/m  Physical Exam Vitals and nursing note reviewed.  Cardiovascular:     Rate and Rhythm: Normal rate. Rhythm irregular.  Pulmonary:     Breath sounds: No wheezing or rhonchi.  Chest:     Chest wall: Tenderness present.     Comments: Tenderness right upper chest wall anteriorly.  No rash. Abdominal:     Tenderness: There is no  abdominal tenderness.  Musculoskeletal:     Cervical back: Neck supple.     Right lower leg: Edema present.     Left lower leg: Edema present.  Skin:    Capillary Refill: Capillary refill takes less than 2 seconds.  Neurological:     Mental Status: She is alert.     ED Results / Procedures / Treatments   Labs (all labs ordered are listed, but only abnormal results are displayed) Labs Reviewed  CBC WITH DIFFERENTIAL/PLATELET - Abnormal; Notable for the following components:      Result Value   RBC 3.57 (*)    Hemoglobin 11.4 (*)    MCV 100.8 (*)    All other components within normal limits  COMPREHENSIVE METABOLIC PANEL - Abnormal; Notable for the following components:   CO2 20 (*)    Glucose, Bld 126 (*)    Creatinine, Ser 1.11 (*)    GFR, Estimated 51 (*)    All other components within normal limits  BRAIN NATRIURETIC PEPTIDE - Abnormal; Notable for the following components:   B Natriuretic Peptide 819.0 (*)    All other components within normal limits  RESP PANEL BY RT-PCR (FLU A&B, COVID) ARPGX2  TSH  MAGNESIUM  TROPONIN I (HIGH SENSITIVITY)  TROPONIN I (HIGH SENSITIVITY)    EKG None  Radiology DG Chest 2 View  Result Date: 01/27/2022 CLINICAL DATA:  Shortness of breath. EXAM: CHEST - 2 VIEW COMPARISON:  03/22/2017 FINDINGS: The cardiac silhouette, mediastinal hilar contours are within normal limits given the AP projection. Small bilateral pleural effusions with overlying atelectasis. No pulmonary edema or pulmonary infiltrates. IMPRESSION: Small bilateral pleural effusions with overlying atelectasis. Electronically Signed   By: Marijo Sanes M.D.   On: 01/27/2022 12:47    Procedures Procedures    Medications Ordered in ED Medications - No data to display  ED Course/ Medical Decision Making/ A&P                           Medical Decision Making Amount and/or Complexity of Data Reviewed Labs: ordered. Radiology: ordered.   Patient with chest pain.   Shortness of breath.  BNP is elevated.  Appears somewhat volume overloaded.  Patient is reportedly up 16 pounds.  Had been started outpatient Lasix but continued shortness of breath.  Sent in for admission.  Already on anticoagulation.  Chest x-ray shows mild effusions.  Will required IV diuresis.  However also have now ordered CT angiography due to right-sided chest pain with previous clots.  Also new onset A-fib.  Will admit to hospitalist.  Patient is rate controlled at this time.        Final Clinical Impression(s) / ED Diagnoses Final diagnoses:  Congestive heart failure, unspecified HF chronicity, unspecified heart failure type [facility])  Atrial fibrillation, unspecified type [facility])    Rx / DC Orders ED Discharge Orders     None         Davonna Belling, MD 01/27/22 1507

## 2022-01-28 ENCOUNTER — Other Ambulatory Visit (HOSPITAL_COMMUNITY): Payer: Self-pay | Admitting: *Deleted

## 2022-01-28 ENCOUNTER — Inpatient Hospital Stay (HOSPITAL_COMMUNITY): Payer: Medicare Other

## 2022-01-28 DIAGNOSIS — I5031 Acute diastolic (congestive) heart failure: Secondary | ICD-10-CM | POA: Diagnosis not present

## 2022-01-28 DIAGNOSIS — I5033 Acute on chronic diastolic (congestive) heart failure: Secondary | ICD-10-CM

## 2022-01-28 DIAGNOSIS — I509 Heart failure, unspecified: Secondary | ICD-10-CM

## 2022-01-28 DIAGNOSIS — I340 Nonrheumatic mitral (valve) insufficiency: Secondary | ICD-10-CM

## 2022-01-28 LAB — BASIC METABOLIC PANEL
BUN/Creatinine Ratio: 11 — ABNORMAL LOW (ref 12–28)
BUN: 10 mg/dL (ref 8–27)
CO2: 19 mmol/L — ABNORMAL LOW (ref 20–29)
Calcium: 9.3 mg/dL (ref 8.7–10.3)
Chloride: 107 mmol/L — ABNORMAL HIGH (ref 96–106)
Creatinine, Ser: 0.91 mg/dL (ref 0.57–1.00)
Glucose: 87 mg/dL (ref 70–99)
Potassium: 4.2 mmol/L (ref 3.5–5.2)
Sodium: 142 mmol/L (ref 134–144)
eGFR: 65 mL/min/{1.73_m2} (ref >59)

## 2022-01-28 LAB — CBC WITH DIFFERENTIAL/PLATELET
Basophils Absolute: 0 10*3/uL (ref 0.0–0.2)
Basos: 0 %
EOS (ABSOLUTE): 0.2 10*3/uL (ref 0.0–0.4)
Eos: 2 %
Hematocrit: 35.2 % (ref 34.0–46.6)
Hemoglobin: 11.4 g/dL (ref 11.1–15.9)
Immature Grans (Abs): 0 10*3/uL (ref 0.0–0.1)
Immature Granulocytes: 0 %
Lymphocytes Absolute: 2.2 10*3/uL (ref 0.7–3.1)
Lymphs: 30 %
MCH: 31.8 pg (ref 26.6–33.0)
MCHC: 32.4 g/dL (ref 31.5–35.7)
MCV: 98 fL — ABNORMAL HIGH (ref 79–97)
Monocytes Absolute: 0.6 10*3/uL (ref 0.1–0.9)
Monocytes: 8 %
Neutrophils Absolute: 4.4 10*3/uL (ref 1.4–7.0)
Neutrophils: 60 %
Platelets: 255 10*3/uL (ref 150–450)
RBC: 3.59 x10E6/uL — ABNORMAL LOW (ref 3.77–5.28)
RDW: 13.1 % (ref 11.7–15.4)
WBC: 7.4 10*3/uL (ref 3.4–10.8)

## 2022-01-28 LAB — ECHOCARDIOGRAM COMPLETE
Area-P 1/2: 3.39 cm2
Height: 67 in
S' Lateral: 2.2 cm
Weight: 3322.77 oz

## 2022-01-28 LAB — BRAIN NATRIURETIC PEPTIDE: BNP: 316.8 pg/mL — ABNORMAL HIGH (ref 0.0–100.0)

## 2022-01-28 MED ORDER — GUAIFENESIN-DM 100-10 MG/5ML PO SYRP
5.0000 mL | ORAL_SOLUTION | ORAL | Status: DC | PRN
  Administered 2022-01-28 – 2022-01-30 (×5): 5 mL via ORAL
  Filled 2022-01-28 (×5): qty 5

## 2022-01-28 MED ORDER — ALBUTEROL SULFATE (2.5 MG/3ML) 0.083% IN NEBU
INHALATION_SOLUTION | RESPIRATORY_TRACT | Status: AC
  Filled 2022-01-28: qty 3

## 2022-01-28 MED ORDER — ALBUTEROL SULFATE (2.5 MG/3ML) 0.083% IN NEBU: 2.5000 mg | INHALATION_SOLUTION | Freq: Four times a day (QID) | RESPIRATORY_TRACT | Status: DC | PRN

## 2022-01-28 MED ORDER — ORAL CARE MOUTH RINSE: 15.0000 mL | OROMUCOSAL | Status: DC | PRN

## 2022-01-28 NOTE — Hospital Course (Signed)
[place] LAUREN MODISETTE is a 81 y.o. female with medical history significant for atrial fibrillation, pulm embolism on chronic anticoagulation, hypertension, breast cancer, HSV, anxiety and depression. Patient was referred to the ED by her outpatient provider after she called complaining of difficulty breathing and 16 pound weight gain.  Patient was seen as outpatient yesterday 12/4 and diagnosed with atrial fibrillation.  She is on chronic anticoagulation, started on metoprolol 12.5 twice daily and Lasix 40 mg twice daily.  She started these medications yesterday. She reports difficulty breathing with exertion and bilateral lower extremity swelling that started 2 days ago.  Reports dry cough and wheezing also.  She reports right upper chest pain- more around her shoulder and back, worse with movements of her upper extremities and improves with rest.   She has not smoked any cigarettes in at least 50 years.  Reports compliance with her Eliquis.   ED Course: Heart rate 53-67.  Respirate rate 16-26.  Blood pressure 154-008 systolic.  O2 sats 92 to 94% on room air.  BNP elevated at  819. Chest x-ray-small bilateral pleural effusions with overlying atelectasis. Subsequent CTA chest-no PE, shows small right-sided pleural effusion and findings suggestive of right heart dysfunction. Hospitalist admit for acute CHF.

## 2022-01-28 NOTE — Progress Notes (Signed)
PROGRESS NOTE    Patient: Theresa Garcia                            PCP: Fayrene Helper, MD                    DOB: [birth year]            DOA: 01/27/2022 EXH:[id number]             DOS: 01/28/2022, 1:35 PM   LOS: 1 day   Date of Service: The patient was seen and examined on 01/28/2022  Subjective:   The patient was seen and examined this morning. Stable at this time. Complaining of shortness of breath, especially with minimal exertion Denies any chest pain or palpitations this morning  Brief Narrative:   Theresa Garcia is a 81 y.o. female with medical history significant for atrial fibrillation, pulm embolism on chronic anticoagulation, hypertension, breast cancer, HSV, anxiety and depression. Patient was referred to the ED by her outpatient provider after she called complaining of difficulty breathing and 16 pound weight gain.  Patient was seen as outpatient yesterday 12/4 and diagnosed with atrial fibrillation.  She is on chronic anticoagulation, started on metoprolol 12.5 twice daily and Lasix 40 mg twice daily.  She started these medications yesterday. She reports difficulty breathing with exertion and bilateral lower extremity swelling that started 2 days ago.  Reports dry cough and wheezing also.  She reports right upper chest pain- more around her shoulder and back, worse with movements of her upper extremities and improves with rest.   She has not smoked any cigarettes in at least 50 years.  Reports compliance with her Eliquis.   ED Course: Heart rate 53-67.  Respirate rate 16-26.  Blood pressure 381-017 systolic.  O2 sats 92 to 94% on room air.  BNP elevated at  819. Chest x-ray-small bilateral pleural effusions with overlying atelectasis. Subsequent CTA chest-no PE, shows small right-sided pleural effusion and findings suggestive of right heart dysfunction. Hospitalist admit for acute CHF.    Assessment & Plan:   Principal Problem:   Acute congestive heart failure  (HCC) Active Problems:   Essential hypertension   Depression with anxiety   Atrial fibrillation (HCC)   Hx of pulmonary embolus     Assessment and Plan: * Acute congestive heart failure (HCC) Acute on chronic diastolic congestive heart failure with exacerbation -Bilateral lower extremity edema +2,  16 pound weight gain, elevated at 819.   In the setting of new onset A-fib diagnosis.   - Last echo from 2019 shows EF of 55 to 60%.   -  CTA chest negative for PE, shows small right-sided pleural effusion findings suggestive of right heart dysfunction  -Obtain updated echocardiogram,  -IV Lasix 40 twice daily -Check input output, daily weights, daily BMP -Troponin 10, trend -Per cardiology holding off beta-blockers for now  -Cardiology consulted appreciate further evaluation and input  Hx of pulmonary embolus Resume Eliquis  Atrial fibrillation ([place]) CHA2DS2-VASc > 2 New atrial fibrillation diagnosed on 12/4.   Ready on chronic anticoagulation for prior PE diagnosis.   Currently rate controlled.  Holding off beta-blockers due to mild bradycardia this morning -Toprol 12.5 mg was given on admission . -Magnesium 1.8, TSH 0.557 both WNL -Continue anticoagulation with Eliquis reports compliance  Depression with anxiety Resume Prozac, Temazepam  Essential hypertension Stable. -Hold amlodipine- benazepril 5- 40 - To allow for diuresis and  also with contrast exposure   ----------------------------------------------------------------------------------------------------------------------------------------------- Nutritional status:  The patient's BMI is: Body mass index is 32.53 kg/m. I agree with the assessment and plan as outlined  ----------------------------------------------------------------------------------------------------------------------------------------------------   DVT prophylaxis:   apixaban (ELIQUIS) tablet 5 mg   Code Status:   Code Status: Full  Code  Family Communication: No family member present at bedside- attempt will be made to update daily The above findings and plan of care has been discussed with patient (and family)  in detail,  they expressed understanding and agreement of above. -Advance care planning has been discussed.   Admission status:   Status is: Inpatient Remains inpatient appropriate because: Needing further evaluation for COPD, new onset CHF, A-fib, cardiology input, IV diuretics     Procedures:   No admission procedures for hospital encounter.   Antimicrobials:  Anti-infectives (From admission, onward)    Start     Dose/Rate Route Frequency Ordered Stop   01/27/22 2200  acyclovir (ZOVIRAX) tablet 400 mg        400 mg Oral 2 times daily 01/27/22 1656          Medication:   acyclovir  400 mg Oral BID   apixaban  5 mg Oral BID   atorvastatin  20 mg Oral QPM   FLUoxetine  10 mg Oral Daily   furosemide  40 mg Intravenous Q12H    acetaminophen **OR** acetaminophen, albuterol, ondansetron **OR** ondansetron (ZOFRAN) IV, mouth rinse, polyethylene glycol, temazepam   Objective:   Vitals:   01/28/22 0403 01/28/22 0500 01/28/22 0555 01/28/22 0800  BP:   121/76   Pulse:   61   Resp:   20   Temp:   98.3 F (36.8 C)   TempSrc:   Oral   SpO2: 95%  95%   Weight:  92.6 kg  94.2 kg  Height:        Intake/Output Summary (Last 24 hours) at 01/28/2022 1335 Last data filed at 01/28/2022 0800 Gross per 24 hour  Intake 240 ml  Output 3650 ml  Net -3410 ml   Filed Weights   01/27/22 1213 01/28/22 0500 01/28/22 0800  Weight: 98 kg 92.6 kg 94.2 kg     Examination:   Physical Exam  Constitution:  Alert, cooperative, no distress,  Appears calm and comfortable  Psychiatric:   Normal and stable mood and affect, cognition intact,   HEENT:        Normocephalic, PERRL, otherwise with in Normal limits  Chest:         Chest symmetric Cardio vascular:  S1/S2, RRR, No murmure, No Rubs or Gallops   pulmonary: Clear to auscultation bilaterally, respirations unlabored, negative wheezes / crackles Abdomen: Soft, non-tender, non-distended, bowel sounds,no masses, no organomegaly Muscular skeletal: Limited exam - in bed, able to move all 4 extremities,   Neuro: CNII-XII intact. , normal motor and sensation, reflexes intact  Extremities: No pitting edema lower extremities, +2 pulses  Skin: Dry, warm to touch, negative for any Rashes, No open wounds Wounds: per nursing documentation   ------------------------------------------------------------------------------------------------------------------------------------------    LABs:     Latest Ref Rng & Units 01/27/2022    1:11 PM 01/26/2022   10:26 AM 07/04/2021    1:41 PM  CBC  WBC 4.0 - 10.5 K/uL 8.7  7.4  6.3   Hemoglobin 12.0 - 15.0 g/dL 11.4  11.4  13.6   Hematocrit 36.0 - 46.0 % 36.0  35.2  40.9   Platelets 150 - 400 K/uL 243  255  269       Latest Ref Rng & Units 01/27/2022    1:11 PM 01/26/2022   10:26 AM 07/04/2021    1:41 PM  CMP  Glucose 70 - 99 mg/dL 126  87  89   BUN 8 - 23 mg/dL '18  10  12   '$ Creatinine 0.44 - 1.00 mg/dL 1.11  0.91  0.84   Sodium 135 - 145 mmol/L 139  142  139   Potassium 3.5 - 5.1 mmol/L 3.9  4.2  4.5   Chloride 98 - 111 mmol/L 109  107  105   CO2 22 - 32 mmol/L '20  19  20   '$ Calcium 8.9 - 10.3 mg/dL 9.2  9.3  9.9   Total Protein 6.5 - 8.1 g/dL 7.1   6.9   Total Bilirubin 0.3 - 1.2 mg/dL 0.9   0.4   Alkaline Phos 38 - 126 U/L 79   56   AST 15 - 41 U/L 37   20   ALT 0 - 44 U/L 30   11        Micro Results Recent Results (from the past 240 hour(s))  Resp Panel by RT-PCR (Flu A&B, Covid) Anterior Nasal Swab     Status: None   Collection Time: 01/27/22  1:00 PM   Specimen: Anterior Nasal Swab  Result Value Ref Range Status   SARS Coronavirus 2 by RT PCR NEGATIVE NEGATIVE Final    Comment: (NOTE) SARS-CoV-2 target nucleic acids are NOT DETECTED.  The SARS-CoV-2 RNA is generally detectable in  upper respiratory specimens during the acute phase of infection. The lowest concentration of SARS-CoV-2 viral copies this assay can detect is 138 copies/mL. A negative result does not preclude SARS-Cov-2 infection and should not be used as the sole basis for treatment or other patient management decisions. A negative result may occur with  improper specimen collection/handling, submission of specimen other than nasopharyngeal swab, presence of viral mutation(s) within the areas targeted by this assay, and inadequate number of viral copies(<138 copies/mL). A negative result must be combined with clinical observations, patient history, and epidemiological information. The expected result is Negative.  Fact Sheet for Patients:  EntrepreneurPulse.com.au  Fact Sheet for Healthcare Providers:  IncredibleEmployment.be  This test is no t yet approved or cleared by the [place] FDA and  has been authorized for detection and/or diagnosis of SARS-CoV-2 by FDA under an Emergency Use Authorization (EUA). This EUA will remain  in effect (meaning this test can be used) for the duration of the COVID-19 declaration under Section 564(b)(1) of the Act, 21 U.S.C.section 360bbb-3(b)(1), unless the authorization is terminated  or revoked sooner.       Influenza A by PCR NEGATIVE NEGATIVE Final   Influenza B by PCR NEGATIVE NEGATIVE Final    Comment: (NOTE) The Xpert Xpress SARS-CoV-2/FLU/RSV plus assay is intended as an aid in the diagnosis of influenza from Nasopharyngeal swab specimens and should not be used as a sole basis for treatment. Nasal washings and aspirates are unacceptable for Xpert Xpress SARS-CoV-2/FLU/RSV testing.  Fact Sheet for Patients: EntrepreneurPulse.com.au  Fact Sheet for Healthcare Providers: IncredibleEmployment.be  This test is not yet approved or cleared by the [place] FDA and has been  authorized for detection and/or diagnosis of SARS-CoV-2 by FDA under an Emergency Use Authorization (EUA). This EUA will remain in effect (meaning this test can be used) for the duration of the COVID-19 declaration under Section 564(b)(1) of the Act, 21 U.S.C. section  360bbb-3(b)(1), unless the authorization is terminated or revoked.  Performed at [facility], [address] [place], [place] [address]     Radiology Reports CT Angio Chest PE W and/or Wo Contrast  Result Date: 01/27/2022 CLINICAL DATA:  PE suspected EXAM: CT ANGIOGRAPHY CHEST WITH CONTRAST TECHNIQUE: Multidetector CT imaging of the chest was performed using the standard protocol during bolus administration of intravenous contrast. Multiplanar CT image reconstructions and MIPs were obtained to evaluate the vascular anatomy. RADIATION DOSE REDUCTION: This exam was performed according to the departmental dose-optimization program which includes automated exposure control, adjustment of the mA and/or kV according to patient size and/or use of iterative reconstruction technique. CONTRAST:  41m OMNIPAQUE IOHEXOL 350 MG/ML SOLN COMPARISON:  None Available. FINDINGS: Cardiovascular: Satisfactory opacification of the pulmonary arteries to the segmental level. No evidence of pulmonary embolism. No pericardial effusion. There is reflux of contrast into the hepatic veins, which can be seen setting of right heart strain. There is right atrial enlargement. RV LV ratio is greater. Mediastinum/Nodes: No enlarged mediastinal, hilar, or axillary lymph nodes. Thyroid gland, trachea, and esophagus demonstrate no significant findings. Lungs/Pleura: No pneumothorax. Small right-sided pleural effusion. Bibasilar atelectasis. No suspicious pulmonary nodules. Upper Abdomen: No acute abnormality. Musculoskeletal: No chest wall abnormality. No acute or significant osseous findings. Review of the MIP images confirms the above findings. IMPRESSION: 1. No evidence  of pulmonary embolism. 2. Small right-sided pleural effusion. 3. Right atrial enlargement with reflux of contrast into the hepatic veins. Findings can be seen in the setting of right heart dysfunction. Correlate with echocardiography. Electronically Signed   By: HMarin RobertsM.D.   On: 01/27/2022 16:00    SIGNED: SDeatra James MD, FHM. [facility],  Pager (please use [email or web address] to page/text) Please use Epic Secure Chat for non-urgent communication (7AM-7PM)  If 7PM-7AM, please contact night-coverage [email or web address], 01/28/2022, 1:35 PM

## 2022-01-28 NOTE — Progress Notes (Signed)
   01/28/22 0800  ReDS Vest / Clip  BMI (Calculated) 32.52  Station Marker D  Ruler Value 35  ReDS Value Range < 36  ReDS Actual Value 21

## 2022-01-28 NOTE — TOC Progression Note (Signed)
  Transition of Care [facility]) Screening Note   Patient Details  Name: Theresa Garcia Date of Birth: Feb 03, 1941   Transition of Care [facility] CM/SW Contact:    Boneta Lucks, RN Phone Number: 01/28/2022, 10:52 AM    Transition of Care Department [facility]) has reviewed patient and no TOC needs have been identified at this time. We will continue to monitor patient advancement through interdisciplinary progression rounds. If new patient transition needs arise, please place a TOC consult.       Barriers to Discharge: Continued Medical Work up

## 2022-01-28 NOTE — Consult Note (Signed)
Cardiology Consultation   Patient ID: Theresa Garcia MRN: [id number]; DOB: Nov 26, 1940  Admit date: 01/27/2022 Date of Consult: 01/28/2022  PCP:  Fayrene Helper, MD   [place] Providers Cardiologist: Previously Dr. Bronson Ing in 2016; New to Dr. Dellia Cloud this admission  Patient Profile:   Theresa Garcia is a 81 y.o. female with a hx of HTN, HLD, breast cancer, history of PE (on chronic anticoagulation) and recently diagnosed atrial fibrillation who is being seen 01/28/2022 for the evaluation of CHF and atrial fibrillation at the request of Dr. Roger Shelter.  History of Present Illness:   Ms. Taketa PCP on 01/26/2022 for worsening dyspnea on exertion, orthopnea and lower extremity edema for the past several days. An EKG was obtained and showed newly diagnosed atrial fibrillation. She was started on Lopressor 12.5 mg twice daily along with Lasix 40 mg twice daily while being continued on Eliquis.  She ultimately presented to [facility] ED yesterday for evaluation of worsening dyspnea and did receive Albuterol by EMS with some improvement. Reported that her weight was at least 16 pounds above baseline. Denied any exertional chest pain but reported discomfort along her right pectoral region which was worse with positional changes.   Initial labs showed WBC 8.7, Hgb 11.4, platelets 243, Na+ 139, K+ 3.9 and creatinine 1.11 (baseline 0.8-0.9), AST 37 and ALT 30. Magnesium 1.8. TSH normal at 0.557. BNP elevated to 819. Initial and repeat troponin values negative at 10. Negative for COVID and influenza. CXR showed small bilateral pleural effusions with overlying atelectasis. CTA showed no evidence of a PE but was noted to have a small right sided pleural effusion and right atrial enlargement with reflux of contrast into the hepatic veins which could correlate with right heart dysfunction. EKG shows atrial fibrillation with slow ventricular response, heart rate 59.  She was  continued on Eliquis for anticoagulation but Lopressor was held given her HR in the 50's. Was started on IV Lasix '40mg'$  BID with a recorded net output of -3.4 L thus far. Weight recorded at 207 lbs today (was at 198 lbs in 12/2021 and up to 216 lbs at the time of her office visit on 01/26/2022).   Past Medical History:  Diagnosis Date   Breast cancer ([place]) 02/24/2000   Bronchitis    Cancer (HCC)    Chronic back pain    Depression    GERD (gastroesophageal reflux disease)    Hyperlipidemia    Hypertension    Insomnia    Kidney stone    Personal history of chemotherapy    Personal history of radiation therapy    Pneumonia    Vertigo     Past Surgical History:  Procedure Laterality Date   ABDOMINAL HYSTERECTOMY     BREAST LUMPECTOMY Left 2002   BREAST SURGERY     left   CYSTOSCOPY/RETROGRADE/URETEROSCOPY/STONE EXTRACTION WITH BASKET  01/07/2011   Procedure: CYSTOSCOPY/RETROGRADE/URETEROSCOPY/STONE EXTRACTION WITH BASKET;  Surgeon: Marissa Nestle;  Location: [facility];  Service: Urology;  Laterality: Left;  Balloon Dilatation; stone given to family per MD   TUBAL LIGATION       Home Medications:  Prior to Admission medications   Medication Sig Start Date End Date Taking? Authorizing Provider  acetaminophen (TYLENOL) 500 MG tablet Take 1,000 mg by mouth every 6 (six) hours as needed for mild pain or moderate pain.    Yes [provider]  acyclovir (ZOVIRAX) 400 MG tablet TAKE 1 TABLET BY MOUTH TWICE  DAILY 01/19/22  Yes Fayrene Helper, MD  alendronate (FOSAMAX) 70 MG tablet TAKE 1 TABLET BY MOUTH EVERY 7  DAYS WITH A FULL GLASS OF WATER  ON AN EMPTY STOMACH 10/17/21  Yes Fayrene Helper, MD  amLODipine-benazepril (LOTREL) 5-40 MG capsule TAKE 1 CAPSULE BY MOUTH DAILY 01/26/22  Yes Fayrene Helper, MD  atorvastatin (LIPITOR) 20 MG tablet TAKE 1 TABLET EVERY EVENING 05/26/21  Yes Fayrene Helper, MD  Calcium Carb-Cholecalciferol (CALTRATE 600+D3) 600-20 MG-MCG  TABS Take one tablet by mouth two times daily 01/07/22  Yes Fayrene Helper, MD  ELIQUIS 5 MG TABS tablet TAKE 1 TABLET BY MOUTH TWICE  DAILY 10/03/21  Yes Fayrene Helper, MD  FLUoxetine (PROZAC) 10 MG capsule TAKE 1 CAPSULE BY MOUTH DAILY 10/03/21  Yes Fayrene Helper, MD  furosemide (LASIX) 40 MG tablet Take 1 tablet (40 mg total) by mouth 2 (two) times daily for 60 doses. 01/26/22 02/25/22 Yes Johnette Abraham, MD  meclizine (ANTIVERT) 25 MG tablet TAKE 1 TABLET BY MOUTH THREE TIMES DAILY AS NEEDED FOR DIZZINESS 05/19/21  Yes Fayrene Helper, MD  metoprolol tartrate (LOPRESSOR) 25 MG tablet Take 0.5 tablets (12.5 mg total) by mouth 2 (two) times daily. 01/26/22 04/26/22 Yes Johnette Abraham, MD  temazepam (RESTORIL) 30 MG capsule Take 1 capsule (30 mg total) by mouth at bedtime as needed for sleep. 07/01/21  Yes Fayrene Helper, MD  vitamin C (ASCORBIC ACID) 500 MG tablet Take 500 mg by mouth daily.   Yes [provider]  Vitamin D, Ergocalciferol, (DRISDOL) 1.25 MG (50000 UNIT) CAPS capsule TAKE 1 CAPSULE BY MOUTH ONCE  WEEKLY 09/10/21  Yes Fayrene Helper, MD  Misc. Devices ([place]) MISC 1 each by Does not apply route daily as needed. 04/13/17   Raylene Everts, MD    Inpatient Medications: Scheduled Meds:  acyclovir  400 mg Oral BID   apixaban  5 mg Oral BID   atorvastatin  20 mg Oral QPM   FLUoxetine  10 mg Oral Daily   furosemide  40 mg Intravenous Q12H   Continuous Infusions:  PRN Meds: acetaminophen **OR** acetaminophen, albuterol, ondansetron **OR** ondansetron (ZOFRAN) IV, mouth rinse, polyethylene glycol, temazepam  Allergies:    Allergies  Allergen Reactions   Meclizine Diarrhea   Penicillins Rash and Other (See Comments)    Has patient had a PCN reaction causing immediate rash, facial/tongue/throat swelling, SOB or lightheadedness with hypotension: no Has patient had a PCN reaction causing severe rash involving mucus membranes or skin  necrosis: No Has patient had a PCN reaction that required hospitalization No Has patient had a PCN reaction occurring within the last 10 years: No If all of the above answers are "NO", then may proceed with Cephalosporin use.     Social History:   Social History   Socioeconomic History   Marital status: Divorced    Spouse name: Not on file   Number of children: 2   Years of education: Not on file   Highest education level: Not on file  Occupational History   Not on file  Tobacco Use   Smoking status: Former    Packs/day: 1.00    Years: 5.00    Total pack years: 5.00    Types: Cigarettes    Quit date: 09/07/1980    Years since quitting: 41.4   Smokeless tobacco: Never   Tobacco comments:    quit 25+ years ago  Substance and Sexual Activity  Alcohol use: No    Alcohol/week: 0.0 standard drinks of alcohol   Drug use: No   Sexual activity: Not Currently  Other Topics Concern   Not on file  Social History Narrative   Not on file   Social Determinants of Health   Financial Resource Strain: Low Risk  (10/03/2020)   Overall Financial Resource Strain (CARDIA)    Difficulty of Paying Living Expenses: Not hard at all  Food Insecurity: No Food Insecurity (10/03/2020)   Hunger Vital Sign    Worried About Running Out of Food in the Last Year: Never true    Ran Out of Food in the Last Year: Never true  Transportation Needs: No Transportation Needs (10/03/2020)   PRAPARE - Hydrologist (Medical): No    Lack of Transportation (Non-Medical): No  Physical Activity: Inactive (10/03/2020)   Exercise Vital Sign    Days of Exercise per Week: 0 days    Minutes of Exercise per Session: 0 min  Stress: No Stress Concern Present (10/03/2020)   [employer]    Feeling of Stress : Not at all  Social Connections: Moderately Isolated (10/03/2020)   Social Connection and Isolation Panel [NHANES]     Frequency of Communication with Friends and Family: More than three times a week    Frequency of Social Gatherings with Friends and Family: Twice a week    Attends Religious Services: More than 4 times per year    Active Member of [employer] or Organizations: No    Attends Archivist Meetings: Never    Marital Status: Divorced  Human resources officer Violence: Not At Risk (10/03/2020)   Humiliation, Afraid, Rape, and Kick questionnaire    Fear of Current or Ex-Partner: No    Emotionally Abused: No    Physically Abused: No    Sexually Abused: No    Family History:    Family History  Problem Relation Age of Onset   COPD Father    COPD Sister    COPD Sister    Diabetes Sister    Diabetes Brother    Seizures Mother    Bone cancer Brother    Heart disease Brother    Aneurysm Brother        brain   HIV/AIDS Brother      ROS:  Please see the history of present illness.   All other ROS reviewed and negative.     Physical Exam/Data:   Vitals:   01/28/22 0403 01/28/22 0500 01/28/22 0555 01/28/22 0800  BP:   121/76   Pulse:   61   Resp:   20   Temp:   98.3 F (36.8 C)   TempSrc:   Oral   SpO2: 95%  95%   Weight:  92.6 kg  94.2 kg  Height:        Intake/Output Summary (Last 24 hours) at 01/28/2022 1142 Last data filed at 01/28/2022 0800 Gross per 24 hour  Intake 240 ml  Output 3650 ml  Net -3410 ml      01/28/2022    8:00 AM 01/28/2022    5:00 AM 01/27/2022   12:13 PM  Last 3 Weights  Weight (lbs) 207 lb 10.8 oz 204 lb 2.3 oz 216 lb  Weight (kg) 94.2 kg 92.6 kg 97.977 kg     Body mass index is 32.53 kg/m.  General:  Well nourished, well developed, in no acute distress HEENT: normal Neck: JVD  elevated to jaw line.  Vascular: No carotid bruits; Distal pulses 2+ bilaterally Cardiac:  normal S1, S2; Irregularly irregular. Lungs: Decreased breath sounds along bases bilaterally and expiratory wheezing noted.  Abd: soft, nontender, no hepatomegaly  Ext: 1+ pitting  edema bilaterally.  Musculoskeletal:  No deformities, BUE and BLE strength normal and equal Skin: warm and dry  Neuro:  CNs 2-12 intact, no focal abnormalities noted Psych:  Normal affect   EKG:  The EKG was personally reviewed and demonstrates: Atrial fibrillation with slow ventricular response, heart rate 59.  Telemetry:  Telemetry was personally reviewed and demonstrates:  Afib, rate controlled  Relevant CV Studies:  Echocardiogram: 02/2017 Study Conclusions   - Left ventricle: The cavity size was normal. Wall thickness was    increased in a pattern of moderate LVH. Systolic function was    normal. The estimated ejection fraction was in the range of 55%    to 60%. Wall motion was normal; there were no regional wall    motion abnormalities. The study is not technically sufficient to    allow evaluation of LV diastolic function.  - Aortic valve: Mildly calcified annulus. Trileaflet; mildly    thickened leaflets.  - Mitral valve: Mildly calcified annulus. Mildly thickened leaflets    . There was mild regurgitation.  - Right ventricle: The cavity size was moderately to severely    dilated. Systolic function was normal. TAPSE: 17.5 mm . Lateral    annulus peak S velocity: 14.6 cm/s.  - Tricuspid valve: There was moderate regurgitation.  - Pulmonary arteries: Systolic pressure was moderately increased.    PA peak pressure: 43 mm Hg (S).  - Technically difficult study.    Laboratory Data:  High Sensitivity Troponin:   Recent Labs  Lab 01/27/22 1311 01/27/22 1534  TROPONINIHS 10 10     Chemistry Recent Labs  Lab 01/26/22 1026 01/27/22 1311  NA 142 139  K 4.2 3.9  CL 107* 109  CO2 19* 20*  GLUCOSE 87 126*  BUN 10 18  CREATININE 0.91 1.11*  CALCIUM 9.3 9.2  MG  --  1.8  GFRNONAA  --  51*  ANIONGAP  --  10    Recent Labs  Lab 01/27/22 1311  PROT 7.1  ALBUMIN 3.5  AST 37  ALT 30  ALKPHOS 79  BILITOT 0.9   Lipids No results for input(s): "CHOL", "TRIG",  "HDL", "LABVLDL", "LDLCALC", "CHOLHDL" in the last 168 hours.  Hematology Recent Labs  Lab 01/26/22 1026 01/27/22 1311  WBC 7.4 8.7  RBC 3.59* 3.57*  HGB 11.4 11.4*  HCT 35.2 36.0  MCV 98* 100.8*  MCH 31.8 31.9  MCHC 32.4 31.7  RDW 13.1 14.6  PLT 255 243   Thyroid  Recent Labs  Lab 01/27/22 1311  TSH 0.557    BNP Recent Labs  Lab 01/26/22 1026 01/27/22 1311  BNP 316.8* 819.0*    DDimer No results for input(s): "DDIMER" in the last 168 hours.   Radiology/Studies:  CT Angio Chest PE W and/or Wo Contrast  Result Date: 01/27/2022 CLINICAL DATA:  PE suspected EXAM: CT ANGIOGRAPHY CHEST WITH CONTRAST TECHNIQUE: Multidetector CT imaging of the chest was performed using the standard protocol during bolus administration of intravenous contrast. Multiplanar CT image reconstructions and MIPs were obtained to evaluate the vascular anatomy. RADIATION DOSE REDUCTION: This exam was performed according to the departmental dose-optimization program which includes automated exposure control, adjustment of the mA and/or kV according to patient size and/or use of iterative  reconstruction technique. CONTRAST:  26m OMNIPAQUE IOHEXOL 350 MG/ML SOLN COMPARISON:  None Available. FINDINGS: Cardiovascular: Satisfactory opacification of the pulmonary arteries to the segmental level. No evidence of pulmonary embolism. No pericardial effusion. There is reflux of contrast into the hepatic veins, which can be seen setting of right heart strain. There is right atrial enlargement. RV LV ratio is greater. Mediastinum/Nodes: No enlarged mediastinal, hilar, or axillary lymph nodes. Thyroid gland, trachea, and esophagus demonstrate no significant findings. Lungs/Pleura: No pneumothorax. Small right-sided pleural effusion. Bibasilar atelectasis. No suspicious pulmonary nodules. Upper Abdomen: No acute abnormality. Musculoskeletal: No chest wall abnormality. No acute or significant osseous findings. Review of the MIP  images confirms the above findings. IMPRESSION: 1. No evidence of pulmonary embolism. 2. Small right-sided pleural effusion. 3. Right atrial enlargement with reflux of contrast into the hepatic veins. Findings can be seen in the setting of right heart dysfunction. Correlate with echocardiography. Electronically Signed   By: HMarin RobertsM.D.   On: 01/27/2022 16:00   DG Chest 2 View  Result Date: 01/27/2022 CLINICAL DATA:  Shortness of breath. EXAM: CHEST - 2 VIEW COMPARISON:  03/22/2017 FINDINGS: The cardiac silhouette, mediastinal hilar contours are within normal limits given the AP projection. Small bilateral pleural effusions with overlying atelectasis. No pulmonary edema or pulmonary infiltrates. IMPRESSION: Small bilateral pleural effusions with overlying atelectasis. Electronically Signed   By: PMarijo SanesM.D.   On: 01/27/2022 12:47     Assessment and Plan:   Patient is a 81year old known to have HTN, chronic PE on anticoagulation, new onset A-fib diagnosed in 01/2022 presented to the hospital with heart failure exacerbation.  Cardiology was consulted for the same and also for A-fib management.  # Acute on chronic diastolic heart failure exacerbation, r/o HFrEF -Continue IV Lasix 40 mg twice daily -Hold beta-blockers due to baseline bradycardia, in Afib. -Will need GDMT initiation depending on LVEF -Follow-up on the pending echo  # New onset Atrial fibrillation, CV score > 3 -Hold rate controlling agents in the setting of baseline bradycardia, in Afib. -Continue Eliquis 5 mg twice daily -Echo scheduled today at 2 PM.  Follow-up on echo.  # Mild mitral valve regurgitation per 2019 echo -Follow-up on repeat echo today  I have spent a total of 49 minutes with patient reviewing chart , telemetry, EKGs, labs and examining patient as well as establishing an assessment and plan that was discussed with the patient.  > 50% of time was spent in direct patient care.     For questions or  updates, please contact CMidlandPlease consult [email or web address] for contact info under    Marcellene Shivley PFidel Levy MD [place] 12:09 PM

## 2022-01-28 NOTE — Progress Notes (Signed)
*  PRELIMINARY RESULTS* Echocardiogram 2D Echocardiogram has been performed.  Theresa Garcia 01/28/2022, 3:46 PM

## 2022-01-28 NOTE — Progress Notes (Signed)
Patient complained of wheezing, this nurse assessed patient, and charge nurse Latina, RN called respiratory to assess.

## 2022-01-29 DIAGNOSIS — I5031 Acute diastolic (congestive) heart failure: Secondary | ICD-10-CM | POA: Diagnosis not present

## 2022-01-29 LAB — BASIC METABOLIC PANEL
Anion gap: 10 (ref 5–15)
BUN: 14 mg/dL (ref 8–23)
CO2: 26 mmol/L (ref 22–32)
Calcium: 9.1 mg/dL (ref 8.9–10.3)
Chloride: 101 mmol/L (ref 98–111)
Creatinine, Ser: 0.96 mg/dL (ref 0.44–1.00)
GFR, Estimated: >60 mL/min (ref >60)
Glucose, Bld: 105 mg/dL — ABNORMAL HIGH (ref 70–99)
Potassium: 3.3 mmol/L — ABNORMAL LOW (ref 3.5–5.1)
Sodium: 137 mmol/L (ref 135–145)

## 2022-01-29 LAB — MAGNESIUM: Magnesium: 1.7 mg/dL (ref 1.7–2.4)

## 2022-01-29 MED ORDER — POTASSIUM CHLORIDE CRYS ER 20 MEQ PO TBCR
60.0000 meq | EXTENDED_RELEASE_TABLET | Freq: Once | ORAL | Status: AC
  Administered 2022-01-29: 60 meq via ORAL
  Filled 2022-01-29: qty 3

## 2022-01-29 MED ORDER — LIVING BETTER WITH HEART FAILURE BOOK: Freq: Once | Status: AC

## 2022-01-29 NOTE — Progress Notes (Incomplete)
Attending note  Patient seen and discussed with PA Strader, I agree with her documentation   1.Acute on chronic HFpEF - 01/2022 echo LVE 31-49%, indet diastolic function, D shaped septum in diasotle, poorly visualized RV but looks to be enlarged with normal function, severe BAE, severe TR, indet PASP - CXR small bilateral pleural effusions. BNP 819. REDs vest 21%  - neg 2.5 L yesterday, neg 5L since admission. She is on IV lasix '40mg'$  bid, labs pending this AM, labs pending -likely SGLT2i before discharge    2.New onset afib - afib with slow rates this admit, home metoprolol held - started on eliquis for stroke prevention  3. Severe TR/RV enlargement - suspect due to long standing severe elevated left sided pressures as suggested by severe BAE. Unable to assess PASP given severe TR.  - does have prior history of PE on chronic anticoag. Brief smoking history. No history of autoimmune disease.  - I think extensive workup for other causes of her RV enlargement beyond severe long standing diastolic dysfunction likely to be of lower yield but could be consided on outpatient basis.

## 2022-01-29 NOTE — TOC Initial Note (Signed)
Transition of Care [facility]) - Initial/Assessment Note    Patient Details  Name: Theresa Garcia MRN: [id number] Date of Birth: November 10, 1940  Transition of Care [facility]) CM/SW Contact:    Salome Arnt, Lavina Phone Number: 01/29/2022, 9:19 AM  Clinical Narrative:  Pt admitted with acute congestive heart failure. Assessment completed with pt due to CHF screening consult. Pt reports she lives with her sister. She is fairly independent with ADLs, but does require assist with bathing. Pt ambulates with a cane. No home health prior to admission. CHF screening completed. Pt indicates she weighs herself almost daily and tries to follow heart healthy diet. Her sister helps with her medications. Will ask RN to order Living Well with CHF book. Pt does not feel she needs home health services at this time. TOC will continue to follow.                   Expected Discharge Plan: Home/Self Care Barriers to Discharge: Continued Medical Work up   Patient Goals and CMS Choice Patient states their goals for this hospitalization and ongoing recovery are:: return home   Choice offered to / list presented to : Patient  Expected Discharge Plan and Services Expected Discharge Plan: Home/Self Care In-house Referral: Clinical Social Work     Living arrangements for the past 2 months: Single Family Home                                      Prior Living Arrangements/Services Living arrangements for the past 2 months: Single Family Home Lives with:: Siblings Patient language and need for interpreter reviewed:: Yes Do you feel safe going back to the place where you live?: Yes          Current home services: DME (cane, shower chair) Criminal Activity/Legal Involvement Pertinent to Current Situation/Hospitalization: No - Comment as needed  Activities of Daily Living      Permission Sought/Granted                  Emotional Assessment     Affect (typically observed):  Appropriate Orientation: : Oriented to Self, Oriented to Place, Oriented to  Time, Oriented to Situation Alcohol / Substance Use: Not Applicable Psych Involvement: No (comment)  Admission diagnosis:  Acute congestive heart failure ([place]) [I50.9] Atrial fibrillation, unspecified type ([place]) [I48.91] Congestive heart failure, unspecified HF chronicity, unspecified heart failure type ([place]) [I50.9] Patient Active Problem List   Diagnosis Date Noted   Acute congestive heart failure ([place]) 01/27/2022   Hx of pulmonary embolus 01/27/2022   Volume overload 01/26/2022   Atrial fibrillation ([place]) 01/26/2022   Encounter for Medicare annual examination with abnormal findings 01/09/2022   Goiter 07/04/2021   Unilateral primary osteoarthritis, left knee 12/26/2020   Poor mobility 11/13/2020   Left anterior knee pain 03/25/2020   Multinodular goiter 02/26/2020   Reduced vision 09/29/2019   Hip pain 09/25/2019   Obesity (BMI 30.0-34.9) 08/08/2017   Unsteady gait 05/31/2016   Axillary lump, left 04/03/2016   GERD (gastroesophageal reflux disease) 04/27/2015   Back pain with radiation 01/29/2015   Depression with anxiety 11/27/2013   Type 2 HSV infection of vulvovaginal region 03/12/2012   History of breast cancer in female 01/13/2010   Hyperlipidemia 01/13/2010   Essential hypertension 01/13/2010   Insomnia 01/13/2010   PCP:  Fayrene Helper, MD Pharmacy:   [place], [place] -  [address] [address] Phone: [phone] Fax: [phone]  [facility] [place], [place] [address] [place] [address] Phone: [phone] Fax: [phone]  [place], [place] [address] Phone: [phone] Fax: [phone]  [place], [place] - [address] [place] [address] Phone: [phone] Fax:  [phone]  [place], [place] [address] [place] [address] Phone: [phone] Fax: [phone]     Social Determinants of Health (SDOH) Interventions    Readmission Risk Interventions     No data to display

## 2022-01-29 NOTE — Progress Notes (Signed)
Patient alert and verbal, ambulated in hallway with assistance. Continues to complain of shortness of breath with exertion. Living better with Heart Failure book given to patient and patient educated.

## 2022-01-29 NOTE — Progress Notes (Signed)
PROGRESS NOTE    Patient: Theresa Garcia                            PCP: Fayrene Helper, MD                    DOB: [birth year]            DOA: 01/27/2022 KDT:[id number]             DOS: 01/29/2022, 12:02 PM   LOS: 2 days   Date of Service: The patient was seen and examined on 01/29/2022  Subjective:   The patient was seen and examined this morning, still complaining shortness of breath at rest and worsening with exertion, and lower extremity swelling Denies having chest pain  Brief Narrative:   Theresa Garcia is a 81 y.o. female with medical history significant for atrial fibrillation, pulm embolism on chronic anticoagulation, hypertension, breast cancer, HSV, anxiety and depression. Patient was referred to the ED by her outpatient provider after she called complaining of difficulty breathing and 16 pound weight gain.  Patient was seen as outpatient yesterday 12/4 and diagnosed with atrial fibrillation.  She is on chronic anticoagulation, started on metoprolol 12.5 twice daily and Lasix 40 mg twice daily.  She started these medications yesterday. She reports difficulty breathing with exertion and bilateral lower extremity swelling that started 2 days ago.  Reports dry cough and wheezing also.  She reports right upper chest pain- more around her shoulder and back, worse with movements of her upper extremities and improves with rest.   She has not smoked any cigarettes in at least 50 years.  Reports compliance with her Eliquis.   ED Course: Heart rate 53-67.  Respirate rate 16-26.  Blood pressure 998-338 systolic.  O2 sats 92 to 94% on room air.  BNP elevated at  819. Chest x-ray-small bilateral pleural effusions with overlying atelectasis. Subsequent CTA chest-no PE, shows small right-sided pleural effusion and findings suggestive of right heart dysfunction. Hospitalist admit for acute CHF.    Assessment & Plan:   Principal Problem:   Acute congestive heart failure  (HCC) Active Problems:   Essential hypertension   Depression with anxiety   Atrial fibrillation (HCC)   Hx of pulmonary embolus     Assessment and Plan: * Acute congestive heart failure (HCC) - HFpEF Acute on chronic Diastolic congestive heart failure with exacerbation -Bilateral lower extremity edema +2,  16 pound weight gain, -BNP >> 819    In the setting of new onset A-fib diagnosis.   - Last echo from 2019 shows EF of 55 to 60%.   -Repeat Echo 01/28/22: EJ EF 60-65%,D-shaped left ventricle, consistent with right ventricular hyper bulimia, right ventricular enlarged, left and right atrial size severely dilated mild MR, -  CTA chest negative for PE, shows small right-sided pleural effusion findings suggestive of right heart dysfunction  -Obtain updated echocardiogram,  -IV Lasix 40 twice daily Filed Weights   01/28/22 0500 01/28/22 0800 01/29/22 0453  Weight: 92.6 kg 94.2 kg 92.5 kg    Intake/Output Summary (Last 24 hours) at 01/29/2022 1158 Last data filed at 01/29/2022 1144 Gross per 24 hour  Intake 600 ml  Output 2800 ml  Net -2200 ml   ReDS 21%   -Troponin 10, trend -Per cardiology holding off beta-blockers for now  -Cardiology consulted appreciate further evaluation and input  Hx of pulmonary embolus Resume Eliquis  Atrial fibrillation (  [place]) CHA2DS2-VASc > 2 New atrial fibrillation diagnosed on 12/4.   Ready on chronic anticoagulation for prior PE diagnosis.   Currently rate controlled.  Holding off beta-blockers due to mild bradycardia (HR: 50- 70s) -Toprol 12.5 mg was given on admission . -Magnesium 1.8, TSH 0.557 both WNL -Continue anticoagulation with Eliquis reports compliance  Depression with anxiety Resume Prozac, Temazepam  Essential hypertension Stable. -Hold amlodipine- benazepril 5- 40 - To allow for diuresis and also with contrast  exposure   ----------------------------------------------------------------------------------------------------------------------------------------------- Nutritional status:  The patient's BMI is: Body mass index is 31.95 kg/m. I agree with the assessment and plan as outlined  ----------------------------------------------------------------------------------------------------------------------------------------------------   DVT prophylaxis:   apixaban (ELIQUIS) tablet 5 mg   Code Status:   Code Status: Full Code  Family Communication: No family member present at bedside- attempt will be made to update daily The above findings and plan of care has been discussed with patient (and family)  in detail,  they expressed understanding and agreement of above. -Advance care planning has been discussed.   Admission status:   Status is: Inpatient Remains inpatient appropriate because: Needing further evaluation for COPD, new onset CHF, A-fib, cardiology input, IV diuretics     Procedures:   No admission procedures for hospital encounter.   Antimicrobials:  Anti-infectives (From admission, onward)    Start     Dose/Rate Route Frequency Ordered Stop   01/27/22 2200  acyclovir (ZOVIRAX) tablet 400 mg        400 mg Oral 2 times daily 01/27/22 1656          Medication:   acyclovir  400 mg Oral BID   apixaban  5 mg Oral BID   atorvastatin  20 mg Oral QPM   FLUoxetine  10 mg Oral Daily   furosemide  40 mg Intravenous Q12H   Living Better with Heart Failure Book   Does not apply Once    acetaminophen **OR** acetaminophen, albuterol, guaiFENesin-dextromethorphan, ondansetron **OR** ondansetron (ZOFRAN) IV, mouth rinse, polyethylene glycol, temazepam   Objective:   Vitals:   01/28/22 1452 01/28/22 2108 01/29/22 0453 01/29/22 0548  BP: 135/79 138/79  137/69  Pulse: (!) 59 69  65  Resp: '18 20  20  '$ Temp: (!) 97.5 F (36.4 C) 98.7 F (37.1 C)  98.5 F (36.9 C)  TempSrc:  Oral Oral  Oral  SpO2: 96% 96%  97%  Weight:   92.5 kg   Height:        Intake/Output Summary (Last 24 hours) at 01/29/2022 1202 Last data filed at 01/29/2022 1144 Gross per 24 hour  Intake 600 ml  Output 2800 ml  Net -2200 ml   Filed Weights   01/28/22 0500 01/28/22 0800 01/29/22 0453  Weight: 92.6 kg 94.2 kg 92.5 kg     Examination:      Physical Exam:   General:  AAO x 3,  cooperative, shortness of breath  HEENT:  Normocephalic, PERRL, otherwise with in Normal limits   Neuro:  CNII-XII intact. , normal motor and sensation, reflexes intact   Lungs:   Clear to auscultation BL, Respirations unlabored,  No wheezes / crackles  Cardio:    S1/S2, RRR, No murmure, No Rubs or Gallops   Abdomen:  Soft, non-tender, bowel sounds active all four quadrants, no guarding or peritoneal signs.  Muscular  skeletal:  Limited exam -global generalized weaknesses - in bed, able to move all 4 extremities,   2+ pulses,  symmetric, +2 pitting edema  Skin:  Dry,  warm to touch, negative for any Rashes,  Wounds: Please see nursing documentation         ------------------------------------------------------------------------------------------------------------------------------------------    LABs:     Latest Ref Rng & Units 01/27/2022    1:11 PM 01/26/2022   10:26 AM 07/04/2021    1:41 PM  CBC  WBC 4.0 - 10.5 K/uL 8.7  7.4  6.3   Hemoglobin 12.0 - 15.0 g/dL 11.4  11.4  13.6   Hematocrit 36.0 - 46.0 % 36.0  35.2  40.9   Platelets 150 - 400 K/uL 243  255  269       Latest Ref Rng & Units 01/27/2022    1:11 PM 01/26/2022   10:26 AM 07/04/2021    1:41 PM  CMP  Glucose 70 - 99 mg/dL 126  87  89   BUN 8 - 23 mg/dL '18  10  12   '$ Creatinine 0.44 - 1.00 mg/dL 1.11  0.91  0.84   Sodium 135 - 145 mmol/L 139  142  139   Potassium 3.5 - 5.1 mmol/L 3.9  4.2  4.5   Chloride 98 - 111 mmol/L 109  107  105   CO2 22 - 32 mmol/L '20  19  20   '$ Calcium 8.9 - 10.3 mg/dL 9.2  9.3  9.9   Total Protein  6.5 - 8.1 g/dL 7.1   6.9   Total Bilirubin 0.3 - 1.2 mg/dL 0.9   0.4   Alkaline Phos 38 - 126 U/L 79   56   AST 15 - 41 U/L 37   20   ALT 0 - 44 U/L 30   11        Micro Results Recent Results (from the past 240 hour(s))  Resp Panel by RT-PCR (Flu A&B, Covid) Anterior Nasal Swab     Status: None   Collection Time: 01/27/22  1:00 PM   Specimen: Anterior Nasal Swab  Result Value Ref Range Status   SARS Coronavirus 2 by RT PCR NEGATIVE NEGATIVE Final    Comment: (NOTE) SARS-CoV-2 target nucleic acids are NOT DETECTED.  The SARS-CoV-2 RNA is generally detectable in upper respiratory specimens during the acute phase of infection. The lowest concentration of SARS-CoV-2 viral copies this assay can detect is 138 copies/mL. A negative result does not preclude SARS-Cov-2 infection and should not be used as the sole basis for treatment or other patient management decisions. A negative result may occur with  improper specimen collection/handling, submission of specimen other than nasopharyngeal swab, presence of viral mutation(s) within the areas targeted by this assay, and inadequate number of viral copies(<138 copies/mL). A negative result must be combined with clinical observations, patient history, and epidemiological information. The expected result is Negative.  Fact Sheet for Patients:  EntrepreneurPulse.com.au  Fact Sheet for Healthcare Providers:  IncredibleEmployment.be  This test is no t yet approved or cleared by the [place] FDA and  has been authorized for detection and/or diagnosis of SARS-CoV-2 by FDA under an Emergency Use Authorization (EUA). This EUA will remain  in effect (meaning this test can be used) for the duration of the COVID-19 declaration under Section 564(b)(1) of the Act, 21 U.S.C.section 360bbb-3(b)(1), unless the authorization is terminated  or revoked sooner.       Influenza A by PCR NEGATIVE NEGATIVE Final    Influenza B by PCR NEGATIVE NEGATIVE Final    Comment: (NOTE) The Xpert Xpress SARS-CoV-2/FLU/RSV plus assay is intended as an aid in the diagnosis of  influenza from Nasopharyngeal swab specimens and should not be used as a sole basis for treatment. Nasal washings and aspirates are unacceptable for Xpert Xpress SARS-CoV-2/FLU/RSV testing.  Fact Sheet for Patients: EntrepreneurPulse.com.au  Fact Sheet for Healthcare Providers: IncredibleEmployment.be  This test is not yet approved or cleared by the [place] FDA and has been authorized for detection and/or diagnosis of SARS-CoV-2 by FDA under an Emergency Use Authorization (EUA). This EUA will remain in effect (meaning this test can be used) for the duration of the COVID-19 declaration under Section 564(b)(1) of the Act, 21 U.S.C. section 360bbb-3(b)(1), unless the authorization is terminated or revoked.  Performed at [facility], [address] [place], [place] [address]     Radiology Reports ECHOCARDIOGRAM COMPLETE  Result Date: 01/28/2022    ECHOCARDIOGRAM REPORT   Patient Name:   KAYLIEE ATIENZA Date of Exam: 01/28/2022 Medical Rec #:  [id number]        Height:       67.0 in Accession #:    [id number]       Weight:       207.7 lb Date of Birth:  03-23-1940       BSA:          2.055 m Patient Age:    40 years         BP:           121/76 mmHg Patient Gender: F                HR:           71 bpm. Exam Location:  [facility] Procedure: 2D Echo, Cardiac Doppler and Color Doppler Indications:    Congestive Heart Failure I50.9  History:        Patient has prior history of Echocardiogram examinations, most                 recent 03/19/2017. CHF; Risk Factors:Hypertension and                 Dyslipidemia. Hx of Pulmonary Embolism. Breast Cancer, GERD.  Sonographer:    Alvino Chapel RCS Referring Phys: [phone] [place]  1. Left ventricular ejection fraction, by estimation, is 60 to  65%. The left ventricle has normal function. The left ventricle has no regional wall motion abnormalities. There is mild left ventricular hypertrophy. Left ventricular diastolic function could not be evaluated. There is the interventricular septum is flattened in diastole ('D' shaped left ventricle), consistent with right ventricular volume overload.  2. Right ventricular systolic function not well visualized but appears to be normal. The right ventricular size is moderately enlarged.  3. Left atrial size was severely dilated.  4. Right atrial size was severely dilated.  5. The mitral valve is degenerative. Mild mitral valve regurgitation. No evidence of mitral stenosis.  6. There is malcoaptation of tricuspid valve leaflets. The tricuspid valve is abnormal. There is severe atrial functional tricuspid valve regurgitation. Pulmonary artery systolic pressure cannot be calculated due to rapid pressure equilibration between right atrium and right ventricle.  7. The aortic valve is tricuspid. There is moderate calcification of the aortic valve. Aortic valve regurgitation is mild. No aortic stenosis is present.  8. The inferior vena cava is dilated in size with <50% respiratory variability, suggesting right atrial pressure of 15 mmHg. Comparison(s): No prior Echocardiogram. FINDINGS  Left Ventricle: Left ventricular ejection fraction, by estimation, is 60 to 65%. The left ventricle has normal function. The left ventricle  has no regional wall motion abnormalities. The left ventricular internal cavity size was normal in size. There is  mild left ventricular hypertrophy. The interventricular septum is flattened in diastole ('D' shaped left ventricle), consistent with right ventricular volume overload. Left ventricular diastolic function could not be evaluated due to atrial fibrillation. Left ventricular diastolic function could not be evaluated. Right Ventricle: The right ventricular size is moderately enlarged. No increase  in right ventricular wall thickness. Right ventricular systolic function not well visualized but appears to be normal. The tricuspid regurgitant velocity is 1.81 m/s, and with an assumed right atrial pressure of 15 mmHg, the estimated right ventricular systolic pressure is 03.0 mmHg. Left Atrium: Left atrial size was severely dilated. Right Atrium: Right atrial size was severely dilated. Pericardium: There is no evidence of pericardial effusion. Mitral Valve: The mitral valve is degenerative in appearance. Mild mitral valve regurgitation. No evidence of mitral valve stenosis. Tricuspid Valve: There is malcoaptation of tricuspid valve leaflets. The tricuspid valve is abnormal. Tricuspid valve regurgitation is severe. No evidence of tricuspid stenosis. Aortic Valve: The aortic valve is tricuspid. There is moderate calcification of the aortic valve. Aortic valve regurgitation is mild. No aortic stenosis is present. Pulmonic Valve: The pulmonic valve was not well visualized. Pulmonic valve regurgitation is mild. No evidence of pulmonic stenosis. Aorta: The aortic root is normal in size and structure. Venous: The inferior vena cava is dilated in size with less than 50% respiratory variability, suggesting right atrial pressure of 15 mmHg. IAS/Shunts: No atrial level shunt detected by color flow Doppler.  LEFT VENTRICLE PLAX 2D LVIDd:         3.30 cm LVIDs:         2.20 cm LV PW:         1.20 cm LV IVS:        1.40 cm LVOT diam:     1.80 cm LV SV:         33 LV SV Index:   16 LVOT Area:     2.54 cm  RIGHT VENTRICLE         IVC TAPSE (M-mode): 1.6 cm  IVC diam: 2.40 cm LEFT ATRIUM              Index        RIGHT ATRIUM           Index LA diam:        4.40 cm  2.14 cm/m   RA Area:     33.60 cm LA Vol (A2C):   110.0 ml 53.53 ml/m  RA Volume:   136.00 ml 66.18 ml/m LA Vol (A4C):   80.7 ml  39.27 ml/m LA Biplane Vol: 99.6 ml  48.47 ml/m  AORTIC VALVE LVOT Vmax:   80.20 cm/s LVOT Vmean:  48.450 cm/s LVOT VTI:    0.132 m   AORTA Ao Root diam: 3.00 cm MITRAL VALVE                TRICUSPID VALVE MV Area (PHT): 3.39 cm     TR Peak grad:   13.1 mmHg MV Decel Time: 224 msec     TR Vmax:        181.00 cm/s MV E velocity: 127.00 cm/s                             SHUNTS  Systemic VTI:  0.13 m                             Systemic Diam: 1.80 cm Vishnu Priya Mallipeddi Electronically signed by Lorelee Cover Mallipeddi Signature Date/Time: 01/28/2022/4:38:47 PM    Final     SIGNED: Deatra James, MD, FHM. [facility],  Pager (please use [email or web address] to page/text) Please use Epic Secure Chat for non-urgent communication (7AM-7PM)  If 7PM-7AM, please contact night-coverage [email or web address], 01/29/2022, 12:02 PM

## 2022-01-29 NOTE — Progress Notes (Signed)
PT Screen  Patient Details Name: Theresa Garcia MRN: [id number] DOB: 21-Apr-1940   Upon therapist entry, patient sitting in recliner with son and nursing tech present in the room.  Upon education about the acute physical therapy, patient reporting that she has already been ambulating about the halls.  Patient and son both confirming and reporting that patient has been ambulatory since she has been here, and does not feel unsafe about mobility.  Patient reports functioning at or near to baseline and reported that she feels that she does not need physical therapy at this time.  Patient can continue to ambulate the halls with son and nursing as available.  Patient was educated on importance of increasing physical activity.  At this point PT to sign off no skilled acute PT needs indicated.   01/29/22 1346  PT Visit Information  Reason Eval/Treat Not Completed PT screened, no needs identified, will sign off  History of Present Illness Theresa Garcia is a 81 y.o. female with medical history significant for atrial fibrillation, pulm embolism on chronic anticoagulation, hypertension, breast cancer, HSV, anxiety and depression.  Patient was referred to the ED by her outpatient provider after she called complaining of difficulty breathing and 16 pound weight gain.  Patient was seen as outpatient yesterday 12/4 and diagnosed with atrial fibrillation.  She is on chronic anticoagulation, started on metoprolol 12.5 twice daily and Lasix 40 mg twice daily.  She started these medications yesterday.  She reports difficulty breathing with exertion and bilateral lower extremity swelling that started 2 days ago.  Reports dry cough and wheezing also.  She reports right upper chest pain- more around her shoulder and back, worse with movements of her upper extremities and improves with rest.   She has not smoked any cigarettes in at least 50 years.  Reports compliance with her Eliquis.    Wonda Olds 01/29/2022,  1:47 PM

## 2022-01-29 NOTE — Progress Notes (Addendum)
Rounding Note    Patient Name: Theresa Garcia Date of Encounter: 01/29/2022  [place] Cardiologist: Previously Dr. Bronson Ing in 2016; New to Dr. Dellia Cloud this admission   Subjective   Breathing improved but not back to baseline. Reports occasional discomfort underneath her breasts bilaterally with movement of her arms but no exertional pain. No palpitations.   Inpatient Medications    Scheduled Meds:  acyclovir  400 mg Oral BID   apixaban  5 mg Oral BID   atorvastatin  20 mg Oral QPM   FLUoxetine  10 mg Oral Daily   furosemide  40 mg Intravenous Q12H   Continuous Infusions:  PRN Meds: acetaminophen **OR** acetaminophen, albuterol, guaiFENesin-dextromethorphan, ondansetron **OR** ondansetron (ZOFRAN) IV, mouth rinse, polyethylene glycol, temazepam   Vital Signs    Vitals:   01/28/22 1452 01/28/22 2108 01/29/22 0453 01/29/22 0548  BP: 135/79 138/79  137/69  Pulse: (!) 59 69  65  Resp: '18 20  20  '$ Temp: (!) 97.5 F (36.4 C) 98.7 F (37.1 C)  98.5 F (36.9 C)  TempSrc: Oral Oral  Oral  SpO2: 96% 96%  97%  Weight:   92.5 kg   Height:        Intake/Output Summary (Last 24 hours) at 01/29/2022 0853 Last data filed at 01/29/2022 0810 Gross per 24 hour  Intake 600 ml  Output 2350 ml  Net -1750 ml      01/29/2022    4:53 AM 01/28/2022    8:00 AM 01/28/2022    5:00 AM  Last 3 Weights  Weight (lbs) 204 lb 207 lb 10.8 oz 204 lb 2.3 oz  Weight (kg) 92.534 kg 94.2 kg 92.6 kg      Telemetry    Atrial fibrillation, HR in 50's to 70's with occasional PVC's.  - Personally Reviewed  ECG    No new tracings.   Physical Exam   GEN: Pleasant, elderly female appearing in no acute distress.   Neck: JVD at 10-11 cm.  Cardiac: Irregularly irregular. No murmurs, rubs, or gallops.  Respiratory: Rales along bases and expiratory wheezing.  GI: Soft, nontender, non-distended  MS: 1+ pitting edema bilaterally; No deformity. Neuro:  Nonfocal  Psych: Normal  affect   Labs    High Sensitivity Troponin:   Recent Labs  Lab 01/27/22 1311 01/27/22 1534  TROPONINIHS 10 10     Chemistry Recent Labs  Lab 01/26/22 1026 01/27/22 1311  NA 142 139  K 4.2 3.9  CL 107* 109  CO2 19* 20*  GLUCOSE 87 126*  BUN 10 18  CREATININE 0.91 1.11*  CALCIUM 9.3 9.2  MG  --  1.8  PROT  --  7.1  ALBUMIN  --  3.5  AST  --  37  ALT  --  30  ALKPHOS  --  79  BILITOT  --  0.9  GFRNONAA  --  51*  ANIONGAP  --  10    Lipids No results for input(s): "CHOL", "TRIG", "HDL", "LABVLDL", "LDLCALC", "CHOLHDL" in the last 168 hours.  Hematology Recent Labs  Lab 01/26/22 1026 01/27/22 1311  WBC 7.4 8.7  RBC 3.59* 3.57*  HGB 11.4 11.4*  HCT 35.2 36.0  MCV 98* 100.8*  MCH 31.8 31.9  MCHC 32.4 31.7  RDW 13.1 14.6  PLT 255 243   Thyroid  Recent Labs  Lab 01/27/22 1311  TSH 0.557    BNP Recent Labs  Lab 01/26/22 1026 01/27/22 1311  BNP 316.8* 819.0*  DDimer No results for input(s): "DDIMER" in the last 168 hours.   Radiology    CT Angio Chest PE W and/or Wo Contrast  Result Date: 01/27/2022 CLINICAL DATA:  PE suspected EXAM: CT ANGIOGRAPHY CHEST WITH CONTRAST TECHNIQUE: Multidetector CT imaging of the chest was performed using the standard protocol during bolus administration of intravenous contrast. Multiplanar CT image reconstructions and MIPs were obtained to evaluate the vascular anatomy. RADIATION DOSE REDUCTION: This exam was performed according to the departmental dose-optimization program which includes automated exposure control, adjustment of the mA and/or kV according to patient size and/or use of iterative reconstruction technique. CONTRAST:  41m OMNIPAQUE IOHEXOL 350 MG/ML SOLN COMPARISON:  None Available. FINDINGS: Cardiovascular: Satisfactory opacification of the pulmonary arteries to the segmental level. No evidence of pulmonary embolism. No pericardial effusion. There is reflux of contrast into the hepatic veins, which can be seen  setting of right heart strain. There is right atrial enlargement. RV LV ratio is greater. Mediastinum/Nodes: No enlarged mediastinal, hilar, or axillary lymph nodes. Thyroid gland, trachea, and esophagus demonstrate no significant findings. Lungs/Pleura: No pneumothorax. Small right-sided pleural effusion. Bibasilar atelectasis. No suspicious pulmonary nodules. Upper Abdomen: No acute abnormality. Musculoskeletal: No chest wall abnormality. No acute or significant osseous findings. Review of the MIP images confirms the above findings. IMPRESSION: 1. No evidence of pulmonary embolism. 2. Small right-sided pleural effusion. 3. Right atrial enlargement with reflux of contrast into the hepatic veins. Findings can be seen in the setting of right heart dysfunction. Correlate with echocardiography. Electronically Signed   By: HMarin RobertsM.D.   On: 01/27/2022 16:00   DG Chest 2 View  Result Date: 01/27/2022 CLINICAL DATA:  Shortness of breath. EXAM: CHEST - 2 VIEW COMPARISON:  03/22/2017 FINDINGS: The cardiac silhouette, mediastinal hilar contours are within normal limits given the AP projection. Small bilateral pleural effusions with overlying atelectasis. No pulmonary edema or pulmonary infiltrates. IMPRESSION: Small bilateral pleural effusions with overlying atelectasis. Electronically Signed   By: PMarijo SanesM.D.   On: 01/27/2022 12:47    Cardiac Studies   Echocardiogram: 01/28/2022 IMPRESSIONS     1. Left ventricular ejection fraction, by estimation, is 60 to 65%. The  left ventricle has normal function. The left ventricle has no regional  wall motion abnormalities. There is mild left ventricular hypertrophy.  Left ventricular diastolic function  could not be evaluated. There is the interventricular septum is flattened  in diastole ('D' shaped left ventricle), consistent with right ventricular  volume overload.   2. Right ventricular systolic function not well visualized but appears to  be  normal. The right ventricular size is moderately enlarged.   3. Left atrial size was severely dilated.   4. Right atrial size was severely dilated.   5. The mitral valve is degenerative. Mild mitral valve regurgitation. No  evidence of mitral stenosis.   6. There is malcoaptation of tricuspid valve leaflets. The tricuspid  valve is abnormal. There is severe atrial functional tricuspid valve  regurgitation. Pulmonary artery systolic pressure cannot be calculated due  to rapid pressure equilibration between  right atrium and right ventricle.   7. The aortic valve is tricuspid. There is moderate calcification of the  aortic valve. Aortic valve regurgitation is mild. No aortic stenosis is  present.   8. The inferior vena cava is dilated in size with <50% respiratory  variability, suggesting right atrial pressure of 15 mmHg.   Patient Profile     81y.o. female w/ PMH of HTN, HLD,  breast cancer, history of PE (on chronic anticoagulation) and recently diagnosed atrial fibrillation who is currently admitted for an acute CHF exacerbation.   Assessment & Plan    1. Acute HFpEF - Presented with worsening dyspnea on exertion, orthopnea and pitting edema. Found to have an acute CHF exacerbation with BNP elevated to 819. Echo yesterday showed a preserved EF of 60-65% with no regional WMA. LA and RA severely dilated.  - She has been receiving IV Lasix '40mg'$  BID  with a recorded net output of -5.1 L thus far and weight has been variable. Listed as 207 lbs yesterday and at 204 lbs today (peaked at 216 lbs at home). Reports a baseline weight of 192 - 194 lbs (Was at 198 lbs in 06/2021 and 12/2021 when evaluated by her PCP). Will recheck a BMET and Mg today as these were not obtained yesterday. If renal function and electrolytes remain stable, would continue with IV diuresis. She does have diffuse wheezing on examination and would consider initiation of scheduled nebulizer treatments but will defer to the  admitting team.   2. New-onset Atrial Fibrillation - Diagnosed by her PCP earlier this week. Rates have been in the 50's to 70's and she has not required the use of AV nodal blocking agents. Would check ambulatory HR prior to discharge.  - Continue Eliquis '5mg'$  BID for anticoagulation.   3. Mitral Regurgitation - Mild by repeat echo this admission. Continue to follow as an outpatient.   For questions or updates, please contact [place] Please consult [email or web address] for contact info under        Signed, Erma Heritage, PA-C  01/29/2022, 8:53 AM    Attending note   Patient seen and discussed with PA Ahmed Prima, I agree with her documentation     1.Acute HFpEF - 01/2022 echo LVE 18-84%, indet diastolic function, D shaped septum in diasotle, poorly visualized RV but looks to be enlarged with normal function, severe BAE, severe TR, indet PASP - CXR small bilateral pleural effusions. BNP 819. REDs vest 21%   - neg 2.5 L yesterday, neg 5L since admission. She is on IV lasix '40mg'$  bid, labs pending this AM, labs pending -likely SGLT2i before discharge  - f/u labs today, continue IV diuresis. I think good chance could transition to oral tomorrow. Would need to ambulate tomorrow and asesss symptoms, potential d/c tomorrow.        2.New onset afib - afib with slow rates this admit, home metoprolol held -  on eliquis for stroke prevention, had already been on for prior history of PE   3. Severe TR/RV enlargement - suspect due to long standing severe elevated left sided pressures as suggested by severe BAE. Unable to assess PASP given severe TR.  - does have prior history of PE on chronic anticoag. Brief smoking history. No history of autoimmune disease.  - I think extensive workup for other causes of her RV enlargement beyond severe long standing diastolic dysfunction likely to be of lower yield but could be consided on outpatient basis.   Carlyle Dolly MD

## 2022-01-30 DIAGNOSIS — I5031 Acute diastolic (congestive) heart failure: Secondary | ICD-10-CM | POA: Diagnosis not present

## 2022-01-30 LAB — BASIC METABOLIC PANEL
Anion gap: 6 (ref 5–15)
BUN: 15 mg/dL (ref 8–23)
CO2: 28 mmol/L (ref 22–32)
Calcium: 8.6 mg/dL — ABNORMAL LOW (ref 8.9–10.3)
Chloride: 107 mmol/L (ref 98–111)
Creatinine, Ser: 0.89 mg/dL (ref 0.44–1.00)
GFR, Estimated: >60 mL/min (ref >60)
Glucose, Bld: 95 mg/dL (ref 70–99)
Potassium: 3.7 mmol/L (ref 3.5–5.1)
Sodium: 141 mmol/L (ref 135–145)

## 2022-01-30 LAB — BRAIN NATRIURETIC PEPTIDE: B Natriuretic Peptide: 403 pg/mL — ABNORMAL HIGH (ref 0.0–100.0)

## 2022-01-30 MED ORDER — TORSEMIDE 20 MG PO TABS
40.0000 mg | ORAL_TABLET | Freq: Every day | ORAL | Status: DC
  Administered 2022-01-30: 40 mg via ORAL
  Filled 2022-01-30: qty 2

## 2022-01-30 MED ORDER — TORSEMIDE 40 MG PO TABS: 40.0000 mg | ORAL_TABLET | Freq: Every day | ORAL | 1 refills | Status: DC

## 2022-01-30 MED ORDER — AMLODIPINE BESYLATE 5 MG PO TABS: 5.0000 mg | ORAL_TABLET | Freq: Every day | ORAL | 1 refills | Status: DC

## 2022-01-30 NOTE — Progress Notes (Deleted)
   01/30/22 0700  Precautions / Armbands  Precautions Fall risk  Patient armbands applied: Patient Identification (White)  Risk for Aspiration  Aspiration Assessment - High Risk None of the above  Aspiration Assessment - At Risk Poor Dentition  Aspiration Risk Interventions At risk interventions implemented  Adult Fall Risk Assessment  Risk Factor Category (scoring not indicated) Not Applicable  Age 81  Fall History: Fall within 6 months prior to admission 0  Elimination; Bowel and/or Urine Incontinence 0  Elimination; Bowel and/or Urine Urgency/Frequency 2  Medications: includes PCA/Opiates, Anti-convulsants, Anti-hypertensives, Diuretics, Hypnotics, Laxatives, Sedatives, and Psychotropics 0  Patient Care Equipment 1  Mobility-Assistance 2  Mobility-Gait 2  Mobility-Sensory Deficit 0  Altered awareness of immediate physical environment 0  Impulsiveness 0  Lack of understanding of one's physical/cognitive limitations 0  Total Score 9  Patient Fall Risk Level Moderate fall risk  Adult Fall Risk Interventions  Required Bundle Interventions *See Row Information* Moderate fall risk - low and moderate requirements implemented  Additional Interventions Use of appropriate toileting equipment (bedpan, BSC, etc.)  Screening for Fall Injury Risk (To be completed on HIGH fall risk patients) - Assessing Need for Floor Mats  Risk For Fall Injury- Criteria for Floor Mats None identified - No additional interventions needed  Safe Patient Handling Assessment  Ambulates independently No  Pt pulls to standing position & wt <120kg (265lbs) Yes  Safe Patient Handling Equipment One assist recommended  Safety Interventions  Less Restrictive Interventions Active listening  Broset Violence Checklist (Document every shift. If negative for 72 hrs, no longer required. Reassess if new symptoms are present)  Confusion 0  Irritable 0  Boisterous 0  Physical threats 0  Verbal threats 0  Attacking objects 0   Total Score 0  Violence Risk Category Small Risk  Violence Prevention Guidelines *See Row Information* Small Violence Risk interventions implemented  Safe Environment  Patient oriented to unit and equipment Yes

## 2022-01-30 NOTE — Discharge Summary (Signed)
Physician Discharge Summary   Patient: Theresa Garcia MRN: [id number] DOB: 03-16-1940  Admit date:     01/27/2022  Discharge date: 01/30/22  Discharge Physician: Deatra James   PCP: Fayrene Helper, MD   Recommendations at discharge:    - Monitor daily weight, any increased of weight 3-5 pounds in 24 hours, contact your cardiologist -Monitor your symptoms for any excessive swelling, shortness of breath, fluid gain contact PCP and cardiology -Continue current medication of torsemide -Neurology in 2-4 weeks  Follow-up with the PCP in 1-4 weeks  Discharge Diagnoses: Principal Problem:   Acute congestive heart failure ([place]) Active Problems:   Essential hypertension   Depression with anxiety   Atrial fibrillation ([place])   Hx of pulmonary embolus  Resolved Problems:   * No resolved hospital problems. *  Hospital Course: Theresa Garcia is a 81 y.o. female with medical history significant for atrial fibrillation, pulm embolism on chronic anticoagulation, hypertension, breast cancer, HSV, anxiety and depression. Patient was referred to the ED by her outpatient provider after she called complaining of difficulty breathing and 16 pound weight gain.  Patient was seen as outpatient yesterday 12/4 and diagnosed with atrial fibrillation.  She is on chronic anticoagulation, started on metoprolol 12.5 twice daily and Lasix 40 mg twice daily.  She started these medications yesterday. She reports difficulty breathing with exertion and bilateral lower extremity swelling that started 2 days ago.  Reports dry cough and wheezing also.  She reports right upper chest pain- more around her shoulder and back, worse with movements of her upper extremities and improves with rest.   She has not smoked any cigarettes in at least 50 years.  Reports compliance with her Eliquis.   ED Course: Heart rate 53-67.  Respirate rate 16-26.  Blood pressure 626-948 systolic.  O2 sats 92 to 94% on room air.  BNP  elevated at  819. Chest x-ray-small bilateral pleural effusions with overlying atelectasis. Subsequent CTA chest-no PE, shows small right-sided pleural effusion and findings suggestive of right heart dysfunction. Hospitalist admit for acute CHF.  Assessment and Plan: * Acute congestive heart failure (HCC) - HFpEF Acute on chronic Diastolic congestive heart failure with exacerbation -Bilateral lower extremity edema +2,  16 pound weight gain, -BNP >> 819    In the setting of new onset A-fib diagnosis.   - Last echo from 2019 shows EF of 55 to 60%.   -Repeat Echo 01/28/22: EJ EF 60-65%,D-shaped left ventricle, consistent with right ventricular hyper bulimia, right ventricular enlarged, left and right atrial size severely dilated mild MR, -  CTA chest negative for PE, shows small right-sided pleural effusion findings suggestive of right heart dysfunction  -Obtain updated echocardiogram,  -IV Lasix 40 twice daily >>> switch to p.o. torsemide 40 mg daily  Intake/Output Summary (Last 24 hours) at 01/30/2022 0932 Last data filed at 01/30/2022 0612 Gross per 24 hour  Intake 480 ml  Output 2400 ml  Net -1920 ml   Filed Weights   01/28/22 0800 01/29/22 0453 01/30/22 0530  Weight: 94.2 kg 92.5 kg 88.8 kg      ReDS 21%   -Troponin 10, 10 -Per cardiology holding off beta-blockers for now  -Cardiology was consulted and were following. Per Dr. Shirley Muscat '40mg'$  daily starting 01/30/22,   likely start SGLT2i at outpatient f/u if costs are reasonable for her, cont. Eliquis     Hx of pulmonary embolus Resume Eliquis  Atrial fibrillation (HCC) CHA2DS2-VASc > 2 New atrial  fibrillation diagnosed on 12/4.   Ready on chronic anticoagulation for prior PE diagnosis.   Currently rate controlled.  Holding off beta-blockers due to mild bradycardia (HR: 50- 70s) -Toprol 12.5 mg was given on admission-- D/Ced  -Magnesium 1.8, TSH 0.557 both WNL -Continue anticoagulation with Eliquis reports  compliance Per Cardiology:  Self rate controlled afib, would not restart her lopressor Continue eliquis for stroke prevention    Depression with anxiety Resume Prozac, Temazepam  Essential hypertension Stable. - D/Ced amlodipine- benazepril 5- 40-- changed to Amlodpine  5 mg daily       Consultants: Cardiology  Procedures performed: Echo  Disposition: Home Diet recommendation:  Discharge Diet Orders (From admission, onward)     Start     Ordered   01/30/22 0000  Diet - low sodium heart healthy        01/30/22 0923           Cardiac diet DISCHARGE MEDICATION: Allergies as of 01/30/2022       Reactions   Meclizine Diarrhea   Penicillins Rash, Other (See Comments)   Has patient had a PCN reaction causing immediate rash, facial/tongue/throat swelling, SOB or lightheadedness with hypotension: no Has patient had a PCN reaction causing severe rash involving mucus membranes or skin necrosis: No Has patient had a PCN reaction that required hospitalization No Has patient had a PCN reaction occurring within the last 10 years: No If all of the above answers are "NO", then may proceed with Cephalosporin use.        Medication List     STOP taking these medications    amLODipine-benazepril 5-40 MG capsule Commonly known as: LOTREL   furosemide 40 MG tablet Commonly known as: LASIX   metoprolol tartrate 25 MG tablet Commonly known as: LOPRESSOR       TAKE these medications    acetaminophen 500 MG tablet Commonly known as: TYLENOL Take 1,000 mg by mouth every 6 (six) hours as needed for mild pain or moderate pain.   acyclovir 400 MG tablet Commonly known as: ZOVIRAX TAKE 1 TABLET BY MOUTH TWICE  DAILY   alendronate 70 MG tablet Commonly known as: FOSAMAX TAKE 1 TABLET BY MOUTH EVERY 7  DAYS WITH A FULL GLASS OF WATER  ON AN EMPTY STOMACH   amLODipine 5 MG tablet Commonly known as: NORVASC Take 1 tablet (5 mg total) by mouth daily.   ascorbic acid 500  MG tablet Commonly known as: VITAMIN C Take 500 mg by mouth daily.   atorvastatin 20 MG tablet Commonly known as: LIPITOR TAKE 1 TABLET EVERY EVENING   Caltrate 600+D3 600-20 MG-MCG Tabs Generic drug: Calcium Carb-Cholecalciferol Take one tablet by mouth two times daily   Eliquis 5 MG Tabs tablet Generic drug: apixaban TAKE 1 TABLET BY MOUTH TWICE  DAILY   FLUoxetine 10 MG capsule Commonly known as: PROZAC TAKE 1 CAPSULE BY MOUTH DAILY   meclizine 25 MG tablet Commonly known as: ANTIVERT TAKE 1 TABLET BY MOUTH THREE TIMES DAILY AS NEEDED FOR DIZZINESS   temazepam 30 MG capsule Commonly known as: RESTORIL Take 1 capsule (30 mg total) by mouth at bedtime as needed for sleep.   Toilet Safety Frame Misc 1 each by Does not apply route daily as needed.   Torsemide 40 MG Tabs Take 40 mg by mouth daily. Start taking on: January 31, 2022   Vitamin D (Ergocalciferol) 1.25 MG (50000 UNIT) Caps capsule Commonly known as: DRISDOL TAKE 1 CAPSULE BY MOUTH ONCE  WEEKLY  Discharge Exam: Filed Weights   01/28/22 0800 01/29/22 0453 01/30/22 0530  Weight: 94.2 kg 92.5 kg 88.8 kg      Physical Exam:   General:  AAO x 3,  cooperative, no distress;   HEENT:  Normocephalic, PERRL, otherwise with in Normal limits   Neuro:  CNII-XII intact. , normal motor and sensation, reflexes intact   Lungs:   Clear to auscultation BL, Respirations unlabored,  No wheezes / crackles  Cardio:    S1/S2, RRR, No murmure, No Rubs or Gallops   Abdomen:  Soft, non-tender, bowel sounds active all four quadrants, no guarding or peritoneal signs.  Muscular  skeletal:  Limited exam -global generalized weaknesses - in bed, able to move all 4 extremities,   2+ pulses,  symmetric, No pitting edema  Skin:  Dry, warm to touch, negative for any Rashes,  Wounds: Please see nursing documentation          Condition at discharge: good  The results of significant diagnostics from this  hospitalization (including imaging, microbiology, ancillary and laboratory) are listed below for reference.   Imaging Studies: ECHOCARDIOGRAM COMPLETE  Result Date: 01/28/2022    ECHOCARDIOGRAM REPORT   Patient Name:   NYLEE BARBUTO Date of Exam: 01/28/2022 Medical Rec #:  [id number]        Height:       67.0 in Accession #:    [id number]       Weight:       207.7 lb Date of Birth:  04-Dec-1940       BSA:          2.055 m Patient Age:    81 years         BP:           121/76 mmHg Patient Gender: F                HR:           71 bpm. Exam Location:  [facility] Procedure: 2D Echo, Cardiac Doppler and Color Doppler Indications:    Congestive Heart Failure I50.9  History:        Patient has prior history of Echocardiogram examinations, most                 recent 03/19/2017. CHF; Risk Factors:Hypertension and                 Dyslipidemia. Hx of Pulmonary Embolism. Breast Cancer, GERD.  Sonographer:    Alvino Chapel RCS Referring Phys: [phone] [place]  1. Left ventricular ejection fraction, by estimation, is 60 to 65%. The left ventricle has normal function. The left ventricle has no regional wall motion abnormalities. There is mild left ventricular hypertrophy. Left ventricular diastolic function could not be evaluated. There is the interventricular septum is flattened in diastole ('D' shaped left ventricle), consistent with right ventricular volume overload.  2. Right ventricular systolic function not well visualized but appears to be normal. The right ventricular size is moderately enlarged.  3. Left atrial size was severely dilated.  4. Right atrial size was severely dilated.  5. The mitral valve is degenerative. Mild mitral valve regurgitation. No evidence of mitral stenosis.  6. There is malcoaptation of tricuspid valve leaflets. The tricuspid valve is abnormal. There is severe atrial functional tricuspid valve regurgitation. Pulmonary artery systolic pressure cannot be calculated due to  rapid pressure equilibration between right atrium and right ventricle.  7. The aortic valve is tricuspid. There  is moderate calcification of the aortic valve. Aortic valve regurgitation is mild. No aortic stenosis is present.  8. The inferior vena cava is dilated in size with <50% respiratory variability, suggesting right atrial pressure of 15 mmHg. Comparison(s): No prior Echocardiogram. FINDINGS  Left Ventricle: Left ventricular ejection fraction, by estimation, is 60 to 65%. The left ventricle has normal function. The left ventricle has no regional wall motion abnormalities. The left ventricular internal cavity size was normal in size. There is  mild left ventricular hypertrophy. The interventricular septum is flattened in diastole ('D' shaped left ventricle), consistent with right ventricular volume overload. Left ventricular diastolic function could not be evaluated due to atrial fibrillation. Left ventricular diastolic function could not be evaluated. Right Ventricle: The right ventricular size is moderately enlarged. No increase in right ventricular wall thickness. Right ventricular systolic function not well visualized but appears to be normal. The tricuspid regurgitant velocity is 1.81 m/s, and with an assumed right atrial pressure of 15 mmHg, the estimated right ventricular systolic pressure is 79.0 mmHg. Left Atrium: Left atrial size was severely dilated. Right Atrium: Right atrial size was severely dilated. Pericardium: There is no evidence of pericardial effusion. Mitral Valve: The mitral valve is degenerative in appearance. Mild mitral valve regurgitation. No evidence of mitral valve stenosis. Tricuspid Valve: There is malcoaptation of tricuspid valve leaflets. The tricuspid valve is abnormal. Tricuspid valve regurgitation is severe. No evidence of tricuspid stenosis. Aortic Valve: The aortic valve is tricuspid. There is moderate calcification of the aortic valve. Aortic valve regurgitation is mild. No  aortic stenosis is present. Pulmonic Valve: The pulmonic valve was not well visualized. Pulmonic valve regurgitation is mild. No evidence of pulmonic stenosis. Aorta: The aortic root is normal in size and structure. Venous: The inferior vena cava is dilated in size with less than 50% respiratory variability, suggesting right atrial pressure of 15 mmHg. IAS/Shunts: No atrial level shunt detected by color flow Doppler.  LEFT VENTRICLE PLAX 2D LVIDd:         3.30 cm LVIDs:         2.20 cm LV PW:         1.20 cm LV IVS:        1.40 cm LVOT diam:     1.80 cm LV SV:         33 LV SV Index:   16 LVOT Area:     2.54 cm  RIGHT VENTRICLE         IVC TAPSE (M-mode): 1.6 cm  IVC diam: 2.40 cm LEFT ATRIUM              Index        RIGHT ATRIUM           Index LA diam:        4.40 cm  2.14 cm/m   RA Area:     33.60 cm LA Vol (A2C):   110.0 ml 53.53 ml/m  RA Volume:   136.00 ml 66.18 ml/m LA Vol (A4C):   80.7 ml  39.27 ml/m LA Biplane Vol: 99.6 ml  48.47 ml/m  AORTIC VALVE LVOT Vmax:   80.20 cm/s LVOT Vmean:  48.450 cm/s LVOT VTI:    0.132 m  AORTA Ao Root diam: 3.00 cm MITRAL VALVE                TRICUSPID VALVE MV Area (PHT): 3.39 cm     TR Peak grad:   13.1 mmHg MV Decel Time: 224  msec     TR Vmax:        181.00 cm/s MV E velocity: 127.00 cm/s                             SHUNTS                             Systemic VTI:  0.13 m                             Systemic Diam: 1.80 cm Vishnu Priya Mallipeddi Electronically signed by Lorelee Cover Mallipeddi Signature Date/Time: 01/28/2022/4:38:47 PM    Final    CT Angio Chest PE W and/or Wo Contrast  Result Date: 01/27/2022 CLINICAL DATA:  PE suspected EXAM: CT ANGIOGRAPHY CHEST WITH CONTRAST TECHNIQUE: Multidetector CT imaging of the chest was performed using the standard protocol during bolus administration of intravenous contrast. Multiplanar CT image reconstructions and MIPs were obtained to evaluate the vascular anatomy. RADIATION DOSE REDUCTION: This exam was performed  according to the departmental dose-optimization program which includes automated exposure control, adjustment of the mA and/or kV according to patient size and/or use of iterative reconstruction technique. CONTRAST:  30m OMNIPAQUE IOHEXOL 350 MG/ML SOLN COMPARISON:  None Available. FINDINGS: Cardiovascular: Satisfactory opacification of the pulmonary arteries to the segmental level. No evidence of pulmonary embolism. No pericardial effusion. There is reflux of contrast into the hepatic veins, which can be seen setting of right heart strain. There is right atrial enlargement. RV LV ratio is greater. Mediastinum/Nodes: No enlarged mediastinal, hilar, or axillary lymph nodes. Thyroid gland, trachea, and esophagus demonstrate no significant findings. Lungs/Pleura: No pneumothorax. Small right-sided pleural effusion. Bibasilar atelectasis. No suspicious pulmonary nodules. Upper Abdomen: No acute abnormality. Musculoskeletal: No chest wall abnormality. No acute or significant osseous findings. Review of the MIP images confirms the above findings. IMPRESSION: 1. No evidence of pulmonary embolism. 2. Small right-sided pleural effusion. 3. Right atrial enlargement with reflux of contrast into the hepatic veins. Findings can be seen in the setting of right heart dysfunction. Correlate with echocardiography. Electronically Signed   By: HMarin RobertsM.D.   On: 01/27/2022 16:00   DG Chest 2 View  Result Date: 01/27/2022 CLINICAL DATA:  Shortness of breath. EXAM: CHEST - 2 VIEW COMPARISON:  03/22/2017 FINDINGS: The cardiac silhouette, mediastinal hilar contours are within normal limits given the AP projection. Small bilateral pleural effusions with overlying atelectasis. No pulmonary edema or pulmonary infiltrates. IMPRESSION: Small bilateral pleural effusions with overlying atelectasis. Electronically Signed   By: PMarijo SanesM.D.   On: 01/27/2022 12:47    Microbiology: Results for orders placed or performed during  the hospital encounter of 01/27/22  Resp Panel by RT-PCR (Flu A&B, Covid) Anterior Nasal Swab     Status: None   Collection Time: 01/27/22  1:00 PM   Specimen: Anterior Nasal Swab  Result Value Ref Range Status   SARS Coronavirus 2 by RT PCR NEGATIVE NEGATIVE Final    Comment: (NOTE) SARS-CoV-2 target nucleic acids are NOT DETECTED.  The SARS-CoV-2 RNA is generally detectable in upper respiratory specimens during the acute phase of infection. The lowest concentration of SARS-CoV-2 viral copies this assay can detect is 138 copies/mL. A negative result does not preclude SARS-Cov-2 infection and should not be used as the sole basis for treatment or other patient management decisions. A  negative result may occur with  improper specimen collection/handling, submission of specimen other than nasopharyngeal swab, presence of viral mutation(s) within the areas targeted by this assay, and inadequate number of viral copies(<138 copies/mL). A negative result must be combined with clinical observations, patient history, and epidemiological information. The expected result is Negative.  Fact Sheet for Patients:  EntrepreneurPulse.com.au  Fact Sheet for Healthcare Providers:  IncredibleEmployment.be  This test is no t yet approved or cleared by the [place] FDA and  has been authorized for detection and/or diagnosis of SARS-CoV-2 by FDA under an Emergency Use Authorization (EUA). This EUA will remain  in effect (meaning this test can be used) for the duration of the COVID-19 declaration under Section 564(b)(1) of the Act, 21 U.S.C.section 360bbb-3(b)(1), unless the authorization is terminated  or revoked sooner.       Influenza A by PCR NEGATIVE NEGATIVE Final   Influenza B by PCR NEGATIVE NEGATIVE Final    Comment: (NOTE) The Xpert Xpress SARS-CoV-2/FLU/RSV plus assay is intended as an aid in the diagnosis of influenza from Nasopharyngeal swab  specimens and should not be used as a sole basis for treatment. Nasal washings and aspirates are unacceptable for Xpert Xpress SARS-CoV-2/FLU/RSV testing.  Fact Sheet for Patients: EntrepreneurPulse.com.au  Fact Sheet for Healthcare Providers: IncredibleEmployment.be  This test is not yet approved or cleared by the [place] FDA and has been authorized for detection and/or diagnosis of SARS-CoV-2 by FDA under an Emergency Use Authorization (EUA). This EUA will remain in effect (meaning this test can be used) for the duration of the COVID-19 declaration under Section 564(b)(1) of the Act, 21 U.S.C. section 360bbb-3(b)(1), unless the authorization is terminated or revoked.  Performed at [facility], [address] [place], [place] [address]     Labs: CBC: Recent Labs  Lab 01/26/22 1026 01/27/22 1311  WBC 7.4 8.7  NEUTROABS 4.4 6.6  HGB 11.4 11.4*  HCT 35.2 36.0  MCV 98* 100.8*  PLT 255 242   Basic Metabolic Panel: Recent Labs  Lab 01/26/22 1026 01/27/22 1311 01/29/22 1104 01/30/22 0439  NA 142 139 137 141  K 4.2 3.9 3.3* 3.7  CL 107* 109 101 107  CO2 19* 20* 26 28  GLUCOSE 87 126* 105* 95  BUN '10 18 14 15  '$ CREATININE 0.91 1.11* 0.96 0.89  CALCIUM 9.3 9.2 9.1 8.6*  MG  --  1.8 1.7  --    Liver Function Tests: Recent Labs  Lab 01/27/22 1311  AST 37  ALT 30  ALKPHOS 79  BILITOT 0.9  PROT 7.1  ALBUMIN 3.5   CBG: No results for input(s): "GLUCAP" in the last 168 hours.  Discharge time spent: greater than 40 minutes.  Signed: Deatra James, MD [facility] 01/30/2022

## 2022-01-30 NOTE — Progress Notes (Addendum)
Notified provider of IV access lost and asked for 1 time order of oral lasix until IV site can be established. Left arm restricted, attempted new IV on RUA x1 with no success.

## 2022-01-30 NOTE — Progress Notes (Signed)
Rounding Note    Patient Name: Theresa Garcia Date of Encounter: 01/30/2022  [place] Cardiologist: Mallipeddi  Subjective   No complaints  Inpatient Medications    Scheduled Meds:  acyclovir  400 mg Oral BID   apixaban  5 mg Oral BID   atorvastatin  20 mg Oral QPM   FLUoxetine  10 mg Oral Daily   torsemide  40 mg Oral Daily   Continuous Infusions:  PRN Meds: acetaminophen **OR** acetaminophen, albuterol, guaiFENesin-dextromethorphan, ondansetron **OR** ondansetron (ZOFRAN) IV, mouth rinse, polyethylene glycol, temazepam   Vital Signs    Vitals:   01/29/22 1600 01/29/22 2133 01/30/22 0530 01/30/22 0609  BP:  119/62  130/82  Pulse:  77  72  Resp: '20 20  20  '$ Temp:  99.3 F (37.4 C)  98.7 F (37.1 C)  TempSrc:  Oral  Oral  SpO2:  98%  96%  Weight:   88.8 kg   Height:        Intake/Output Summary (Last 24 hours) at 01/30/2022 0858 Last data filed at 01/30/2022 0612 Gross per 24 hour  Intake 480 ml  Output 2400 ml  Net -1920 ml      01/30/2022    5:30 AM 01/29/2022    4:53 AM 01/28/2022    8:00 AM  Last 3 Weights  Weight (lbs) 195 lb 12.3 oz 204 lb 207 lb 10.8 oz  Weight (kg) 88.8 kg 92.534 kg 94.2 kg      Telemetry    Rate controlled afib - Personally Reviewed  ECG    N/a - Personally Reviewed  Physical Exam   GEN: No acute distress.   Neck: No JVD Cardiac: irreg Respiratory: Clear to auscultation bilaterally. GI: Soft, nontender, non-distended  MS: No edema; No deformity. Neuro:  Nonfocal  Psych: Normal affect   Labs    High Sensitivity Troponin:   Recent Labs  Lab 01/27/22 1311 01/27/22 1534  TROPONINIHS 10 10     Chemistry Recent Labs  Lab 01/27/22 1311 01/29/22 1104 01/30/22 0439  NA 139 137 141  K 3.9 3.3* 3.7  CL 109 101 107  CO2 20* 26 28  GLUCOSE 126* 105* 95  BUN '18 14 15  '$ CREATININE 1.11* 0.96 0.89  CALCIUM 9.2 9.1 8.6*  MG 1.8 1.7  --   PROT 7.1  --   --   ALBUMIN 3.5  --   --   AST 37  --    --   ALT 30  --   --   ALKPHOS 79  --   --   BILITOT 0.9  --   --   GFRNONAA 51* >60 >60  ANIONGAP '10 10 6    '$ Lipids No results for input(s): "CHOL", "TRIG", "HDL", "LABVLDL", "LDLCALC", "CHOLHDL" in the last 168 hours.  Hematology Recent Labs  Lab 01/26/22 1026 01/27/22 1311  WBC 7.4 8.7  RBC 3.59* 3.57*  HGB 11.4 11.4*  HCT 35.2 36.0  MCV 98* 100.8*  MCH 31.8 31.9  MCHC 32.4 31.7  RDW 13.1 14.6  PLT 255 243   Thyroid  Recent Labs  Lab 01/27/22 1311  TSH 0.557    BNP Recent Labs  Lab 01/26/22 1026 01/27/22 1311 01/30/22 0439  BNP 316.8* 819.0* 403.0*    DDimer No results for input(s): "DDIMER" in the last 168 hours.   Radiology    ECHOCARDIOGRAM COMPLETE  Result Date: 01/28/2022    ECHOCARDIOGRAM REPORT   Patient Name:   Theresa Garcia  Date of Exam: 01/28/2022 Medical Rec #:  [id number]        Height:       67.0 in Accession #:    [id number]       Weight:       207.7 lb Date of Birth:  10/21/1940       BSA:          2.055 m Patient Age:    81 years         BP:           121/76 mmHg Patient Gender: F                HR:           71 bpm. Exam Location:  [facility] Procedure: 2D Echo, Cardiac Doppler and Color Doppler Indications:    Congestive Heart Failure I50.9  History:        Patient has prior history of Echocardiogram examinations, most                 recent 03/19/2017. CHF; Risk Factors:Hypertension and                 Dyslipidemia. Hx of Pulmonary Embolism. Breast Cancer, GERD.  Sonographer:    Alvino Chapel RCS Referring Phys: [phone] [place]  1. Left ventricular ejection fraction, by estimation, is 60 to 65%. The left ventricle has normal function. The left ventricle has no regional wall motion abnormalities. There is mild left ventricular hypertrophy. Left ventricular diastolic function could not be evaluated. There is the interventricular septum is flattened in diastole ('D' shaped left ventricle), consistent with right ventricular volume  overload.  2. Right ventricular systolic function not well visualized but appears to be normal. The right ventricular size is moderately enlarged.  3. Left atrial size was severely dilated.  4. Right atrial size was severely dilated.  5. The mitral valve is degenerative. Mild mitral valve regurgitation. No evidence of mitral stenosis.  6. There is malcoaptation of tricuspid valve leaflets. The tricuspid valve is abnormal. There is severe atrial functional tricuspid valve regurgitation. Pulmonary artery systolic pressure cannot be calculated due to rapid pressure equilibration between right atrium and right ventricle.  7. The aortic valve is tricuspid. There is moderate calcification of the aortic valve. Aortic valve regurgitation is mild. No aortic stenosis is present.  8. The inferior vena cava is dilated in size with <50% respiratory variability, suggesting right atrial pressure of 15 mmHg. Comparison(s): No prior Echocardiogram. FINDINGS  Left Ventricle: Left ventricular ejection fraction, by estimation, is 60 to 65%. The left ventricle has normal function. The left ventricle has no regional wall motion abnormalities. The left ventricular internal cavity size was normal in size. There is  mild left ventricular hypertrophy. The interventricular septum is flattened in diastole ('D' shaped left ventricle), consistent with right ventricular volume overload. Left ventricular diastolic function could not be evaluated due to atrial fibrillation. Left ventricular diastolic function could not be evaluated. Right Ventricle: The right ventricular size is moderately enlarged. No increase in right ventricular wall thickness. Right ventricular systolic function not well visualized but appears to be normal. The tricuspid regurgitant velocity is 1.81 m/s, and with an assumed right atrial pressure of 15 mmHg, the estimated right ventricular systolic pressure is 91.6 mmHg. Left Atrium: Left atrial size was severely dilated. Right  Atrium: Right atrial size was severely dilated. Pericardium: There is no evidence of pericardial effusion. Mitral Valve: The mitral valve is degenerative  in appearance. Mild mitral valve regurgitation. No evidence of mitral valve stenosis. Tricuspid Valve: There is malcoaptation of tricuspid valve leaflets. The tricuspid valve is abnormal. Tricuspid valve regurgitation is severe. No evidence of tricuspid stenosis. Aortic Valve: The aortic valve is tricuspid. There is moderate calcification of the aortic valve. Aortic valve regurgitation is mild. No aortic stenosis is present. Pulmonic Valve: The pulmonic valve was not well visualized. Pulmonic valve regurgitation is mild. No evidence of pulmonic stenosis. Aorta: The aortic root is normal in size and structure. Venous: The inferior vena cava is dilated in size with less than 50% respiratory variability, suggesting right atrial pressure of 15 mmHg. IAS/Shunts: No atrial level shunt detected by color flow Doppler.  LEFT VENTRICLE PLAX 2D LVIDd:         3.30 cm LVIDs:         2.20 cm LV PW:         1.20 cm LV IVS:        1.40 cm LVOT diam:     1.80 cm LV SV:         33 LV SV Index:   16 LVOT Area:     2.54 cm  RIGHT VENTRICLE         IVC TAPSE (M-mode): 1.6 cm  IVC diam: 2.40 cm LEFT ATRIUM              Index        RIGHT ATRIUM           Index LA diam:        4.40 cm  2.14 cm/m   RA Area:     33.60 cm LA Vol (A2C):   110.0 ml 53.53 ml/m  RA Volume:   136.00 ml 66.18 ml/m LA Vol (A4C):   80.7 ml  39.27 ml/m LA Biplane Vol: 99.6 ml  48.47 ml/m  AORTIC VALVE LVOT Vmax:   80.20 cm/s LVOT Vmean:  48.450 cm/s LVOT VTI:    0.132 m  AORTA Ao Root diam: 3.00 cm MITRAL VALVE                TRICUSPID VALVE MV Area (PHT): 3.39 cm     TR Peak grad:   13.1 mmHg MV Decel Time: 224 msec     TR Vmax:        181.00 cm/s MV E velocity: 127.00 cm/s                             SHUNTS                             Systemic VTI:  0.13 m                             Systemic Diam: 1.80  cm Vishnu Priya Mallipeddi Electronically signed by Lorelee Cover Mallipeddi Signature Date/Time: 01/28/2022/4:38:47 PM    Final     Cardiac Studies   Echocardiogram: 01/28/2022 IMPRESSIONS     1. Left ventricular ejection fraction, by estimation, is 60 to 65%. The  left ventricle has normal function. The left ventricle has no regional  wall motion abnormalities. There is mild left ventricular hypertrophy.  Left ventricular diastolic function  could not be evaluated. There is the interventricular septum is flattened  in diastole ('D' shaped left ventricle), consistent with right ventricular  volume overload.   2. Right ventricular systolic function not well visualized but appears to  be normal. The right ventricular size is moderately enlarged.   3. Left atrial size was severely dilated.   4. Right atrial size was severely dilated.   5. The mitral valve is degenerative. Mild mitral valve regurgitation. No  evidence of mitral stenosis.   6. There is malcoaptation of tricuspid valve leaflets. The tricuspid  valve is abnormal. There is severe atrial functional tricuspid valve  regurgitation. Pulmonary artery systolic pressure cannot be calculated due  to rapid pressure equilibration between  right atrium and right ventricle.   7. The aortic valve is tricuspid. There is moderate calcification of the  aortic valve. Aortic valve regurgitation is mild. No aortic stenosis is  present.   8. The inferior vena cava is dilated in size with <50% respiratory  variability, suggesting right atrial pressure of 15 mmHg.   Patient Profile     81 y.o. female w/ PMH of HTN, HLD, breast cancer, history of PE (on chronic anticoagulation) and recently diagnosed atrial fibrillation who is currently admitted for an acute CHF exacerbation.    Assessment & Plan  1.Acute HFpEF - 01/2022 echo LVE 74-25%, indet diastolic function, D shaped septum in diasotle, poorly visualized RV but looks to be enlarged with  normal function, severe BAE, severe TR, indet PASP - CXR small bilateral pleural effusions. BNP 819. REDs vest 21%   - neg 2 L yesterday, neg 7L since admission. She is on IV lasix '40mg'$  bid,downtrend in Cr with diuresis consistent with venous congestion and HF. Primary team has changed to oral torsemide '40mg'$  daily (appears lost IV access) starting today which I would agree with. - would likely start SGLT2i at outpatient f/u if costs are reasonable for her      2.New onset afib - afib with slow rates this admit, home metoprolol held. Self rate controlled afib, would not restart her lopressor -  on eliquis for stroke prevention, had already been on for prior history of PE   3. Severe TR/RV enlargement - suspect due to long standing severe elevated left sided pressures as suggested by severe BAE. Unable to assess PASP given severe TR.  - does have prior history of PE on chronic anticoag. Brief smoking history. No history of autoimmune disease.  - I think extensive workup for other causes of her RV enlargement beyond severe long standing diastolic dysfunction likely to be of lower yield but could be consided on outpatient basis.    [place] for discharge from cardiac standpoint, we will arrange outpatient f/u  For questions or updates, please contact [place] Please consult [email or web address] for contact info under        Signed, Carlyle Dolly, MD  01/30/2022, 8:58 AM

## 2022-01-30 NOTE — Progress Notes (Signed)
Assisted to chair for breakfast.  Lungs clear and slight nonpitting edema in feet.  Loss of IV access over night and unsuccessful attempt for another so unable to get 0600 dose of IV lasix. Secure chat to Dr. Roger Shelter to see if dose can be changed to po since patient going home today.

## 2022-01-30 NOTE — Plan of Care (Signed)

## 2022-01-30 NOTE — Care Management Important Message (Signed)
Important Message  Patient Details  Name: Theresa Garcia MRN: [id number] Date of Birth: [birth year]   Medicare Important Message Given:  N/A - LOS <3 / Initial given by admissions     Tommy Medal 01/30/2022, 9:48 AM

## 2022-02-02 ENCOUNTER — Telehealth: Payer: Self-pay

## 2022-02-02 NOTE — Telephone Encounter (Signed)
Transition Care Management Follow-up Telephone Call Date of discharge and from where: [facility] How have you been since you were released from the hospital? better Any questions or concerns? No  Items Reviewed: Did the pt receive and understand the discharge instructions provided? Yes  Medications obtained and verified? Yes  Other? No  Any new allergies since your discharge? No  Dietary orders reviewed? Yes Do you have support at home? Yes   Home Care and Equipment/Supplies: Were home health services ordered? no If so, what is the name of the agency? N/a  Has the agency set up a time to come to the patient's home? not applicable Were any new equipment or medical supplies ordered?  No What is the name of the medical supply agency? N/a Were you able to get the supplies/equipment? {Response; yes/no/na:63} Do you have any questions related to the use of the equipment or supplies? {ACZ/YS:063016010}  Functional Questionnaire: (I = Independent and D = Dependent) ADLs: ***  Bathing/Dressing- ***  Meal Prep- ***  Eating- ***  Maintaining continence- ***  Transferring/Ambulation- ***  Managing Meds- ***  Follow up appointments reviewed:  PCP Hospital f/u appt confirmed? {YES/NO:21197} Scheduled to see *** on *** @ ***. [facility] f/u appt confirmed? {YES/NO:21197} Scheduled to see *** on *** @ ***. Are transportation arrangements needed? {YES/NO:21197} If their condition worsens, is the pt aware to call PCP or go to the Emergency Dept.? {YES/NO TITLE CASE:22902} Was the patient provided with contact information for the PCP's office or ED? {YES/NO TITLE CASE:22902} Was to pt encouraged to call back with questions or concerns? {YES/NO TITLE CASE:22902}

## 2022-02-06 ENCOUNTER — Encounter: Payer: Self-pay | Admitting: Family Medicine

## 2022-02-06 ENCOUNTER — Ambulatory Visit: Payer: Medicare Other | Admitting: Family Medicine

## 2022-02-06 VITALS — BP 131/80 | HR 79 | Ht 67.0 in | Wt 194.1 lb

## 2022-02-06 DIAGNOSIS — Z7689 Persons encountering health services in other specified circumstances: Secondary | ICD-10-CM | POA: Diagnosis not present

## 2022-02-06 DIAGNOSIS — F418 Other specified anxiety disorders: Secondary | ICD-10-CM

## 2022-02-06 DIAGNOSIS — E042 Nontoxic multinodular goiter: Secondary | ICD-10-CM

## 2022-02-06 DIAGNOSIS — I4891 Unspecified atrial fibrillation: Secondary | ICD-10-CM

## 2022-02-06 DIAGNOSIS — E8779 Other fluid overload: Secondary | ICD-10-CM

## 2022-02-06 DIAGNOSIS — I071 Rheumatic tricuspid insufficiency: Secondary | ICD-10-CM

## 2022-02-06 DIAGNOSIS — I10 Essential (primary) hypertension: Secondary | ICD-10-CM

## 2022-02-06 DIAGNOSIS — I5031 Acute diastolic (congestive) heart failure: Secondary | ICD-10-CM | POA: Diagnosis not present

## 2022-02-06 DIAGNOSIS — G4452 New daily persistent headache (NDPH): Secondary | ICD-10-CM | POA: Diagnosis not present

## 2022-02-06 NOTE — Patient Instructions (Signed)
F/U as before, call if you need me sooner  You DO  have cardiology appt in [place] already scheduled , keep this  Chem 7 and EGFr today, stay on all medications you are currently taking  You will be referred to ENT re the goiter ( large thyroid gland)  I will get a scan of your head due to headaches  Please monitor swallowing difficulty, if a problem call , you will need to see the GI SPECIALISTS  Continue daily weight and salt restriction, you are doing extremely well  Thanks for choosing [facility], we consider it a privelige to serve you.

## 2022-02-07 LAB — BMP8+EGFR
BUN/Creatinine Ratio: 23 (ref 12–28)
BUN: 27 mg/dL (ref 8–27)
CO2: 25 mmol/L (ref 20–29)
Calcium: 11.3 mg/dL — ABNORMAL HIGH (ref 8.7–10.3)
Chloride: 97 mmol/L (ref 96–106)
Creatinine, Ser: 1.16 mg/dL — ABNORMAL HIGH (ref 0.57–1.00)
Glucose: 103 mg/dL — ABNORMAL HIGH (ref 70–99)
Potassium: 3.4 mmol/L — ABNORMAL LOW (ref 3.5–5.2)
Sodium: 142 mmol/L (ref 134–144)
eGFR: 48 mL/min/{1.73_m2} — ABNORMAL LOW (ref >59)

## 2022-02-08 DIAGNOSIS — I071 Rheumatic tricuspid insufficiency: Secondary | ICD-10-CM | POA: Insufficient documentation

## 2022-02-08 DIAGNOSIS — Z7689 Persons encountering health services in other specified circumstances: Secondary | ICD-10-CM | POA: Insufficient documentation

## 2022-02-08 DIAGNOSIS — G4452 New daily persistent headache (NDPH): Secondary | ICD-10-CM | POA: Insufficient documentation

## 2022-02-08 MED ORDER — POTASSIUM CHLORIDE CRYS ER 20 MEQ PO TBCR: 20.0000 meq | EXTENDED_RELEASE_TABLET | Freq: Every day | ORAL | 3 refills | Status: DC

## 2022-02-08 NOTE — Assessment & Plan Note (Signed)
Refer ENT,pt concerned about increasing size

## 2022-02-08 NOTE — Assessment & Plan Note (Signed)
Resolved, stable and controlled on current regime, update cmp , re educated re importance of daily weights , fluid restriction and salt limitation. F/u Cardiology

## 2022-02-08 NOTE — Assessment & Plan Note (Signed)
F/U with Cardiology

## 2022-02-08 NOTE — Assessment & Plan Note (Signed)
Onset 01/20/2022, frontal, neurologiuc exam within normal, will order scan

## 2022-02-08 NOTE — Assessment & Plan Note (Signed)
Increased anxiety due to ill health of sibling who recently had a CVA

## 2022-02-08 NOTE — Assessment & Plan Note (Signed)
Patient in for follow up of recent hospitalization.and raniotion of care visit Discharge summary, and laboratory and radiology data are reviewed, and any questions or concerns  are discussed. Specific issues requiring follow up are specifically addressed.

## 2022-02-08 NOTE — Assessment & Plan Note (Signed)
resolved 

## 2022-02-08 NOTE — Progress Notes (Signed)
   Theresa Garcia     MRN: [id number]      DOB: 1940-06-25   HPI Theresa Garcia is here for [facility] visit from recent hospitalization from 12/05 to 01/30/2022, when she was hospitalized because of acute heart failure and new onset a fib Reports feeling much better since hospitalization and has been weighting daily and maintaining her post discharge weight as well as limiting salt intake C/o frontal headache persisting and new in past 3 weeks, since 01/20/2022, tylenol affords some relief but it remains and returns. Has increased anxiety about her sister who was recently hospitalized with a stroke and is in rehab, states " she has never been ill"  ROS Denies recent fever or chills. Denies sinus pressure, nasal congestion, ear pain or sore throat. Denies chest congestion, productive cough or wheezing. Denies chest pains, palpitations and leg swelling Denies abdominal pain, nausea, vomiting,diarrhea or constipation.   Denies dysuria, frequency, hesitancy or incontinence. Chronic  joint pain, swelling and limitation in mobility.  Denies skin break down or rash.   PE  BP 131/80 (BP Location: Right Arm, Patient Position: Sitting, Cuff Size: Large)   Pulse 79   Ht '5\' 7"'$  (1.702 m)   Wt 194 lb 1.3 oz (88 kg)   SpO2 94%   BMI 30.40 kg/m   Patient alert and oriented and in no cardiopulmonary distress.  HEENT: No facial asymmetry, EOMI,     Neck supple .  Chest: Clear to auscultation bilaterally.  CVS: S1, S2 systolic murmur, no S3.Regular rate.  ABD: Soft non tender.   Ext: No edema  MS: Adequate though reduced  ROM spine, shoulders, hips and knees.  Skin: Intact, no ulcerations or rash noted.  Psych: Good eye contact, normal affect. Memory intact not anxious or depressed appearing.  CNS: CN 2-12 intact, power,  normal throughout.no focal deficits noted.   Assessment & Plan  Acute congestive heart failure (HCC) Resolved, stable and controlled on current regime, update cmp , re  educated re importance of daily weights , fluid restriction and salt limitation. F/u Cardiology  Severe tricuspid regurgitation F/U with Cardiology  Atrial fibrillation ([place]) Rate controlled , maintained on eliquis  Multinodular goiter Refer ENT,pt concerned about increasing size  Volume overload resolved  Depression with anxiety Increased anxiety due to ill health of sibling who recently had a CVA  Encounter for support and coordination of transition of care Patient in for follow up of recent hospitalization.and raniotion of care visit Discharge summary, and laboratory and radiology data are reviewed, and any questions or concerns  are discussed. Specific issues requiring follow up are specifically addressed.   New persistent daily headache Onset 01/20/2022, frontal, neurologiuc exam within normal, will order scan

## 2022-02-08 NOTE — Assessment & Plan Note (Signed)
Rate controlled , maintained on eliquis

## 2022-02-09 NOTE — Addendum Note (Signed)
Addended by: Smitty Knudsen on: 02/09/2022 02:04 PM   Modules accepted: Orders

## 2022-02-25 ENCOUNTER — Ambulatory Visit (HOSPITAL_COMMUNITY)
Admission: RE | Admit: 2022-02-25 | Discharge: 2022-02-25 | Disposition: A | Discharge status: Home / Self Care | Payer: Medicare Other | Source: Ambulatory Visit | Attending: Family Medicine | Admitting: Family Medicine

## 2022-02-25 ENCOUNTER — Other Ambulatory Visit: Payer: Self-pay

## 2022-02-25 ENCOUNTER — Ambulatory Visit: Payer: Medicare Other | Admitting: Nurse Practitioner

## 2022-02-25 ENCOUNTER — Encounter: Payer: Self-pay | Admitting: Nurse Practitioner

## 2022-02-25 VITALS — BP 120/64 | HR 67 | Ht 66.0 in | Wt 196.2 lb

## 2022-02-25 DIAGNOSIS — I4891 Unspecified atrial fibrillation: Secondary | ICD-10-CM | POA: Insufficient documentation

## 2022-02-25 DIAGNOSIS — G4452 New daily persistent headache (NDPH): Secondary | ICD-10-CM | POA: Diagnosis not present

## 2022-02-25 DIAGNOSIS — I5032 Chronic diastolic (congestive) heart failure: Secondary | ICD-10-CM

## 2022-02-25 DIAGNOSIS — Z79899 Other long term (current) drug therapy: Secondary | ICD-10-CM

## 2022-02-25 DIAGNOSIS — E049 Nontoxic goiter, unspecified: Secondary | ICD-10-CM

## 2022-02-25 DIAGNOSIS — I340 Nonrheumatic mitral (valve) insufficiency: Secondary | ICD-10-CM | POA: Diagnosis present

## 2022-02-25 DIAGNOSIS — R600 Localized edema: Secondary | ICD-10-CM

## 2022-02-25 DIAGNOSIS — I517 Cardiomegaly: Secondary | ICD-10-CM | POA: Insufficient documentation

## 2022-02-25 DIAGNOSIS — I071 Rheumatic tricuspid insufficiency: Secondary | ICD-10-CM | POA: Insufficient documentation

## 2022-02-25 MED ORDER — ELASTIC BANDAGES & SUPPORTS MISC: 1.0000 | 0 refills | Status: DC

## 2022-02-25 MED ORDER — AMLODIPINE BESYLATE 5 MG PO TABS: 5.0000 mg | ORAL_TABLET | Freq: Every day | ORAL | 1 refills | Status: DC

## 2022-02-25 MED ORDER — DAPAGLIFLOZIN PROPANEDIOL 10 MG PO TABS: 10.0000 mg | ORAL_TABLET | Freq: Every day | ORAL | 11 refills | Status: DC

## 2022-02-25 NOTE — Progress Notes (Signed)
Cardiology Office Note:    Date:  02/25/2022  ID:  Theresa, Garcia 1940/11/09, MRN [id number]  PCP:  Fayrene Helper, MD   [facility] Cardiologist:  Chalmers Guest, MD     Referring MD: Fayrene Helper, MD   CC: Here for hospital follow-up  History of Present Illness:    Theresa Garcia is a 82 y.o. female with a hx of the following:  Hx of PE (chronic anticoagulation) Hypertension New onset A-fib (Dx 01/2022) HFpEF TR GERD History of breast cancer Type II HSV infection of vulvovaginal region Obesity Hyperlipidemia Vertigo Depression  Patient is a very pleasant 82 year old female with past medical history as mentioned above.  Underwent CCTA in [place], [place] in 2009 that demonstrated normal coronary arteries.  Previous patient of Dr. Bronson Ing.  Was evaluated in 2016 for evaluation of dizziness.  Denied any chest pain at that time.  She stated she seldom got dizzy and did not have to take meclizine in the recent past.  Had scarlet fever as a child.  EKG revealed normal sinus rhythm, no ischemic changes.  No medication changes were made.  Was told to follow-up as needed.  Admission in December 2023 for CHF exacerbation. Dx with A-fib 01/26/2022 prior to hospitalization.  Chest x-ray in ED showed small bilateral pleural effusions with overlying atelectasis.  CTA of chest was negative for PE, showed findings suggestive of right-sided dysfunction with small right-sided pleural effusions.  BNP 819.  Echocardiogram during admission showed normal EF, D-shaped left ventricle, consistent with right ventricular hyper bulimia right ventricular enlargement, left and right atrial size severely dilated, mild MR.  Received IV Lasix and switched to torsemide 40 mg daily.  Troponins low and flat.  Cardiology recommended to hold off beta-blockers due to mild bradycardia.  Would likely start SGLT2 inhibitor outpatient follow-up because the reason for  her.  Continued on Eliquis.  Discharged in stable condition on 01/30/2022.  Was told to follow-up with cardiology outpatient.  Today she presents for hospital follow-up.  She states she is doing well. Denies any chest pain and breathing is stable. Recently saw her PCP who placed her on Metoprolol Tartrate 12.5 mg BID for her A-fib. Denies any issues with this. Tolerating her medications well. Denies any palpitations, syncope, presyncope, dizziness, orthopnea, PND, significant weight changes, or acute bleeding. Does admit to goiter on her thyroid, not currently being managed. Denies any other questions or concerns.   Past Medical History:  Diagnosis Date   (HFpEF) heart failure with preserved ejection fraction (HCC)    A-fib ([place])    Dx: 01/2022   Breast cancer ([place]) 02/24/2000   Bronchitis    Cancer (HCC)    Chronic back pain    Depression    GERD (gastroesophageal reflux disease)    Hyperlipidemia    Hypertension    Insomnia    Kidney stone    Personal history of chemotherapy    Personal history of radiation therapy    Pneumonia    Thyroid goiter    Vertigo     Past Surgical History:  Procedure Laterality Date   ABDOMINAL HYSTERECTOMY     BREAST LUMPECTOMY Left 2002   BREAST SURGERY     left   CYSTOSCOPY/RETROGRADE/URETEROSCOPY/STONE EXTRACTION WITH BASKET  01/07/2011   Procedure: CYSTOSCOPY/RETROGRADE/URETEROSCOPY/STONE EXTRACTION WITH BASKET;  Surgeon: Marissa Nestle;  Location: [facility];  Service: Urology;  Laterality: Left;  Balloon Dilatation; stone given to family per MD   TUBAL LIGATION  Current Medications: Current Meds  Medication Sig   acetaminophen (TYLENOL) 500 MG tablet Take 1,000 mg by mouth every 6 (six) hours as needed for mild pain or moderate pain.    acyclovir (ZOVIRAX) 400 MG tablet TAKE 1 TABLET BY MOUTH TWICE  DAILY   alendronate (FOSAMAX) 70 MG tablet TAKE 1 TABLET BY MOUTH EVERY 7  DAYS WITH A FULL GLASS OF WATER  ON AN EMPTY STOMACH    atorvastatin (LIPITOR) 20 MG tablet TAKE 1 TABLET EVERY EVENING   Calcium Carb-Cholecalciferol (CALTRATE 600+D3) 600-20 MG-MCG TABS Take 1 tablet by mouth daily. Take one tablet by mouth one time daily   Elastic Bandages & Supports MISC 1 Package by Does not apply route as directed.   ELIQUIS 5 MG TABS tablet TAKE 1 TABLET BY MOUTH TWICE  DAILY   FLUoxetine (PROZAC) 10 MG capsule TAKE 1 CAPSULE BY MOUTH DAILY   meclizine (ANTIVERT) 25 MG tablet TAKE 1 TABLET BY MOUTH THREE TIMES DAILY AS NEEDED FOR DIZZINESS   metoprolol tartrate (LOPRESSOR) 25 MG tablet Take 12.5 mg by mouth 2 (two) times daily.   Misc. Devices ([place]) MISC 1 each by Does not apply route daily as needed.   potassium chloride SA (KLOR-CON M) 20 MEQ tablet Take 1 tablet (20 mEq total) by mouth daily.   temazepam (RESTORIL) 30 MG capsule Take 1 capsule (30 mg total) by mouth at bedtime as needed for sleep.   torsemide 40 MG TABS Take 40 mg by mouth daily.   vitamin C (ASCORBIC ACID) 500 MG tablet Take 500 mg by mouth daily.   Vitamin D, Ergocalciferol, (DRISDOL) 1.25 MG (50000 UNIT) CAPS capsule TAKE 1 CAPSULE BY MOUTH ONCE  WEEKLY   amLODipine (NORVASC) 5 MG tablet Take 1 tablet (5 mg total) by mouth daily.     Allergies:   Meclizine and Penicillins   Social History   Socioeconomic History   Marital status: Divorced    Spouse name: Not on file   Number of children: 2   Years of education: Not on file   Highest education level: Not on file  Occupational History   Not on file  Tobacco Use   Smoking status: Former    Packs/day: 1.00    Years: 5.00    Total pack years: 5.00    Types: Cigarettes    Quit date: 09/07/1980    Years since quitting: 41.5   Smokeless tobacco: Never   Tobacco comments:    quit 25+ years ago  Vaping Use   Vaping Use: Never used  Substance and Sexual Activity   Alcohol use: No    Alcohol/week: 0.0 standard drinks of alcohol   Drug use: No   Sexual activity: Not Currently   Other Topics Concern   Not on file  Social History Narrative   Not on file   Social Determinants of Health   Financial Resource Strain: Low Risk  (10/03/2020)   Overall Financial Resource Strain (CARDIA)    Difficulty of Paying Living Expenses: Not hard at all  Food Insecurity: No Food Insecurity (10/03/2020)   Hunger Vital Sign    Worried About Running Out of Food in the Last Year: Never true    [place] in the Last Year: Never true  Transportation Needs: No Transportation Needs (10/03/2020)   PRAPARE - Hydrologist (Medical): No    Lack of Transportation (Non-Medical): No  Physical Activity: Inactive (10/03/2020)   Exercise Vital  Sign    Days of Exercise per Week: 0 days    Minutes of Exercise per Session: 0 min  Stress: No Stress Concern Present (10/03/2020)   [place]    Feeling of Stress : Not at all  Social Connections: Moderately Isolated (10/03/2020)   Social Connection and Isolation Panel [NHANES]    Frequency of Communication with Friends and Family: More than three times a week    Frequency of Social Gatherings with Friends and Family: Twice a week    Attends Religious Services: More than 4 times per year    Active Member of [employer] or Organizations: No    Attends Music therapist: Never    Marital Status: Divorced     Family History: The patient's family history includes Aneurysm in her brother; Bone cancer in her brother; COPD in her father, sister, and sister; Diabetes in her brother and sister; HIV/AIDS in her brother; Heart disease in her brother; Seizures in her mother.  ROS:   Review of Systems  Constitutional: Negative.   HENT: Negative.         Goiter on thyroid. See HPI.   Eyes: Negative.   Respiratory: Negative.    Cardiovascular: Negative.   Gastrointestinal: Negative.   Genitourinary: Negative.   Musculoskeletal: Negative.   Skin:  Negative.   Neurological: Negative.   Endo/Heme/Allergies: Negative.   Psychiatric/Behavioral: Negative.      Please see the history of present illness.    All other systems reviewed and are negative.  EKGs/Labs/Other Studies Reviewed:    The following studies were reviewed today:   EKG:  EKG is not ordered today.   Echocardiogram on January 28, 2022: 1. Left ventricular ejection fraction, by estimation, is 60 to 65%. The  left ventricle has normal function. The left ventricle has no regional  wall motion abnormalities. There is mild left ventricular hypertrophy.  Left ventricular diastolic function  could not be evaluated. There is the interventricular septum is flattened  in diastole ('D' shaped left ventricle), consistent with right ventricular  volume overload.   2. Right ventricular systolic function not well visualized but appears to  be normal. The right ventricular size is moderately enlarged.   3. Left atrial size was severely dilated.   4. Right atrial size was severely dilated.   5. The mitral valve is degenerative. Mild mitral valve regurgitation. No  evidence of mitral stenosis.   6. There is malcoaptation of tricuspid valve leaflets. The tricuspid  valve is abnormal. There is severe atrial functional tricuspid valve  regurgitation. Pulmonary artery systolic pressure cannot be calculated due  to rapid pressure equilibration between  right atrium and right ventricle.   7. The aortic valve is tricuspid. There is moderate calcification of the  aortic valve. Aortic valve regurgitation is mild. No aortic stenosis is  present.   8. The inferior vena cava is dilated in size with <50% respiratory  variability, suggesting right atrial pressure of 15 mmHg.   Comparison(s): No prior Echocardiogram.  CT angio chest (PE) on January 27, 2022: IMPRESSION: 1. No evidence of pulmonary embolism. 2. Small right-sided pleural effusion. 3. Right atrial enlargement with reflux  of contrast into the hepatic veins. Findings can be seen in the setting of right heart dysfunction. Correlate with echocardiography.   2 view chest x-ray on January 27, 2022: Small bilateral pleural effusions with overlying atelectasis   Thyroid ultrasound on Jul 15, 2021: Enlarged multinodular thyroid gland.  A number of small and/or benign-appearing thyroid nodules as described do not meet criteria for further dedicated follow-up or biopsy.   Recent Labs: 01/27/2022: ALT 30; Hemoglobin 11.4; Platelets 243; TSH 0.557 01/29/2022: Magnesium 1.7 01/30/2022: B Natriuretic Peptide 403.0 02/06/2022: BUN 27; Creatinine, Ser 1.16; Potassium 3.4; Sodium 142  Recent Lipid Panel    Component Value Date/Time   CHOL 149 07/04/2021 1341   TRIG 96 07/04/2021 1341   HDL 53 07/04/2021 1341   CHOLHDL 2.8 07/04/2021 1341   CHOLHDL 2.5 04/25/2019 1053   VLDL 21 09/03/2016 1343   LDLCALC 78 07/04/2021 1341   LDLCALC 79 04/25/2019 1053     Risk Assessment/Calculations:    CHA2DS2-VASc Score = 7   This indicates a 11.2% annual risk of stroke. The patient's score is based upon: CHF History: 1 HTN History: 1 Diabetes History: 0 Stroke History: 2 Vascular Disease History: 0 Age Score: 2 Gender Score: 1   The 10-year ASCVD risk score (Arnett DK, et al., 2019) is: 13%   Values used to calculate the score:     Age: 6 years     Sex: Female     Is Non-Hispanic African American: Yes     Diabetic: No     Tobacco smoker: No     Systolic Blood Pressure: 037 mmHg     Is BP treated: Yes     HDL Cholesterol: 53 mg/dL     Total Cholesterol: 149 mg/dL   Physical Exam:    VS:  BP 120/64   Pulse 67   Ht '5\' 6"'$  (1.676 m)   Wt 196 lb 3.2 oz (89 kg)   SpO2 95%   BMI 31.67 kg/m     Wt Readings from Last 3 Encounters:  02/25/22 196 lb 3.2 oz (89 kg)  02/06/22 194 lb 1.3 oz (88 kg)  01/30/22 195 lb 12.3 oz (88.8 kg)     GEN: Obese, 82 y.o. female in no acute distress HEENT: Normal NECK: No  JVD; No carotid bruits, thyroid goiter noted along right lobe of thyroid CARDIAC: S1/S2, irregular rhythm and regular rate, no murmurs, rubs, gallops; 2+ pulses throughout RESPIRATORY:  Clear to auscultation without rales, wheezing or rhonchi  MUSCULOSKELETAL:  trace, nonpitting edema along bilateral lower legs; No deformity  SKIN: Warm and dry NEUROLOGIC:  Alert and oriented x 3 PSYCHIATRIC:  Normal affect   ASSESSMENT:    1. Chronic heart failure with preserved ejection fraction ([place])   2. Medication management   3. Atrial fibrillation, unspecified type ([place])   4. Nonrheumatic mitral valve regurgitation   5. Severe tricuspid regurgitation   6. Right ventricular enlargement   7. Thyroid goiter   8. Leg edema    PLAN:    In order of problems listed above:  HFpEF Recently hospitalized with CHF exacerbation.  BNP was 819.  Echocardiogram showed normal EF, no RWMA, mild LVH, left ventricular diastolic function could not be evaluated.  The interventricular septum was flattened in diastole with left ventricle shaped and D formation, findings consistent with right ventricular volume overload.  Received IV Lasix and was switched to p.o. torsemide.  Tolerating medications well.  It was suggested to consider SGLT2 inhibitor initiation at outpatient follow-up.  Will initiate Farxiga 10 mg daily.  Denies any history of recurrent UTI/yeast infections.  Continue torsemide and potassium along with metoprolol as she is tolerating her medications well. Euvolemic and well compensated on exam. Low sodium diet, fluid restriction <2L, and daily weights encouraged. Educated to  contact our office for weight gain of 2 lbs overnight or 5 lbs in one week. Heart healthy diet and regular cardiovascular exercise encouraged. Will check BMET in 2 weeks after initiation per protocol.   New onset A-fib Newly diagnosed with A-fib prior to hospital visit in 01/2022.  Cardiology recommended to hold off AV nodal  agents/beta-blockers due to mild bradycardia; however, she is tolerating low dose Lopressor well.  Heart rate well-controlled today.  Continue Eliquis 5 mg twice daily for CHA2DS2-VASc score of 7 and current medication regimen.  Denies any bleeding issues and on appropriate dosage. Heart healthy diet and regular cardiovascular exercise encouraged. Will check BMET and thyroid hormones as mentioned below.   MR, TR, RV enlargement Denies any symptoms. Echocardiogram during hospitalization revealed mild MR without stenosis and mal coaptation of the tricuspid valve leaflets, severe atrial functional tricuspid valve regurgitation, PASP cannot be calculated.  Dr. Carlyle Dolly she was consulted during hospitalization states this was suspected due to long standing severe elevated left-sided pressures as suggested by severe BAE.  Dr. Carlyle Dolly recommended that extensive workup for other causes of RV enlargement beyond severe longstanding diastolic dysfunction could be considered on outpatient basis.  Will route this note to Dr. Carlyle Dolly to consider cardiac MRI as part of further workup. Will repeat echocardiogram outpatient within 1 year or sooner if clinically indicated. Heart healthy diet and regular cardiovascular exercise encouraged.   Thyroid goiter Noted on exam at patient's right lobe. Thyroid ultrasound on Jul 15, 2021 revealed enlarged multinodular thyroid gland.  A number of small and/or benign-appearing thyroid nodules did not meet criteria for further dedicated follow-up or biopsy. Continue to follow with PCP. Recheck TSH normal. Will check T3 and T4.   5. Leg edema Trace, nonpitting edema along bilateral lower legs. Will write Rx for compression stockings. Low salt, heart healthy diet recommended. Leg elevation also recommended.   5. Disposition: Follow up with me in 1 month or sooner if anything changes.    Medication Adjustments/Labs and Tests Ordered: Current medicines are  reviewed at length with the patient today.  Concerns regarding medicines are outlined above.  Orders Placed This Encounter  Procedures   Basic Metabolic Panel (BMET)   T3   T4   Meds ordered this encounter  Medications   dapagliflozin propanediol (FARXIGA) 10 MG TABS tablet    Sig: Take 1 tablet (10 mg total) by mouth daily before breakfast.    Dispense:  30 tablet    Refill:  11   Elastic Bandages & Supports MISC    Sig: 1 Package by Does not apply route as directed.    Dispense:  1 Package    Refill:  0    Patient Instructions  Medication Instructions:  Your physician has recommended you make the following change in your medication:   Start Farxiga 10 mg Daily   *If you need a refill on your cardiac medications before your next appointment, please call your pharmacy*   Lab Work: Your physician recommends that you return for lab work in: 2 Weeks ( T3, T4, BMET)   If you have labs (blood work) drawn today and your tests are completely normal, you will receive your results only by: [place] (if you have [place]) OR A paper copy in the mail If you have any lab test that is abnormal or we need to change your treatment, we will call you to review the results.   Testing/Procedures: NONE    Follow-Up: At [facility], you and your health needs are our priority.  As part of our continuing mission to provide you with exceptional heart care, we have created designated Provider Care Teams.  These Care Teams include your primary Cardiologist (physician) and Advanced Practice Providers (APPs -  Physician Assistants and Nurse Practitioners) who all work together to provide you with the care you need, when you need it.  We recommend signing up for the patient portal called "MyChart".  Sign up information is provided on this After Visit Summary.  MyChart is used to connect with patients for Virtual Visits (Telemedicine).  Patients are able to view lab/test results,  encounter notes, upcoming appointments, etc.  Non-urgent messages can be sent to your provider as well.   To learn more about what you can do with MyChart, go to [email or web address].    Your next appointment:   1 month(s)  The format for your next appointment:   In Person  Provider:   Finis Bud, NP    Other Instructions Thank you for choosing [facility]!    Important Information About Sugar      Blood Pressure Record Sheet To take your blood pressure, you will need a blood pressure machine. You may be prescribed one, or you can buy a blood pressure machine (blood pressure monitor) at your clinic, drug store, or online. When choosing one, look for these features: An automatic monitor that has an arm cuff. A cuff that wraps snugly, but not too tightly, around your upper arm. You should be able to fit only one finger between your arm and the cuff. A device that stores blood pressure reading results. Do not choose a monitor that measures your blood pressure from your wrist or finger. Follow your health care provider's instructions for how to take your blood pressure. To use this form: Get one reading in the morning (a.m.) before you take any medicines. Get one reading in the evening (p.m.) before supper. Take at least two readings with each blood pressure check. This makes sure the results are correct. Wait 1-2 minutes between measurements. Write down the results in the spaces on this form. Repeat this once a week, or as told by your health care provider. Make a follow-up appointment with your health care provider to discuss the results. Blood pressure log Date: _______________________ a.m. _____________________(1st reading) _____________________(2nd reading) p.m. _____________________(1st reading) _____________________(2nd reading) Date: _______________________ a.m. _____________________(1st reading) _____________________(2nd reading) p.m.  _____________________(1st reading) _____________________(2nd reading) Date: _______________________ a.m. _____________________(1st reading) _____________________(2nd reading) p.m. _____________________(1st reading) _____________________(2nd reading) Date: _______________________ a.m. _____________________(1st reading) _____________________(2nd reading) p.m. _____________________(1st reading) _____________________(2nd reading) Date: _______________________ a.m. _____________________(1st reading) _____________________(2nd reading) p.m. _____________________(1st reading) _____________________(2nd reading) This information is not intended to replace advice given to you by your health care provider. Make sure you discuss any questions you have with your health care provider. Document Revised: 10/24/2020 Document Reviewed: 10/24/2020 Elsevier Patient Education  [place], Finis Bud, NP  02/27/2022 4:20 PM    [place]

## 2022-02-25 NOTE — Patient Instructions (Signed)
Medication Instructions:  Your physician has recommended you make the following change in your medication:   Start Farxiga 10 mg Daily   *If you need a refill on your cardiac medications before your next appointment, please call your pharmacy*   Lab Work: Your physician recommends that you return for lab work in: 2 Weeks ( T3, T4, BMET)   If you have labs (blood work) drawn today and your tests are completely normal, you will receive your results only by: [place] (if you have [place]) OR A paper copy in the mail If you have any lab test that is abnormal or we need to change your treatment, we will call you to review the results.   Testing/Procedures: NONE    Follow-Up: At [facility], you and your health needs are our priority.  As part of our continuing mission to provide you with exceptional heart care, we have created designated Provider Care Teams.  These Care Teams include your primary Cardiologist (physician) and Advanced Practice Providers (APPs -  Physician Assistants and Nurse Practitioners) who all work together to provide you with the care you need, when you need it.  We recommend signing up for the patient portal called "MyChart".  Sign up information is provided on this After Visit Summary.  MyChart is used to connect with patients for Virtual Visits (Telemedicine).  Patients are able to view lab/test results, encounter notes, upcoming appointments, etc.  Non-urgent messages can be sent to your provider as well.   To learn more about what you can do with MyChart, go to [email or web address].    Your next appointment:   1 month(s)  The format for your next appointment:   In Person  Provider:   Finis Bud, NP    Other Instructions Thank you for choosing [facility]!    Important Information About Sugar      Blood Pressure Record Sheet To take your blood pressure, you will need a blood pressure machine. You may be prescribed  one, or you can buy a blood pressure machine (blood pressure monitor) at your clinic, drug store, or online. When choosing one, look for these features: An automatic monitor that has an arm cuff. A cuff that wraps snugly, but not too tightly, around your upper arm. You should be able to fit only one finger between your arm and the cuff. A device that stores blood pressure reading results. Do not choose a monitor that measures your blood pressure from your wrist or finger. Follow your health care provider's instructions for how to take your blood pressure. To use this form: Get one reading in the morning (a.m.) before you take any medicines. Get one reading in the evening (p.m.) before supper. Take at least two readings with each blood pressure check. This makes sure the results are correct. Wait 1-2 minutes between measurements. Write down the results in the spaces on this form. Repeat this once a week, or as told by your health care provider. Make a follow-up appointment with your health care provider to discuss the results. Blood pressure log Date: _______________________ a.m. _____________________(1st reading) _____________________(2nd reading) p.m. _____________________(1st reading) _____________________(2nd reading) Date: _______________________ a.m. _____________________(1st reading) _____________________(2nd reading) p.m. _____________________(1st reading) _____________________(2nd reading) Date: _______________________ a.m. _____________________(1st reading) _____________________(2nd reading) p.m. _____________________(1st reading) _____________________(2nd reading) Date: _______________________ a.m. _____________________(1st reading) _____________________(2nd reading) p.m. _____________________(1st reading) _____________________(2nd reading) Date: _______________________ a.m. _____________________(1st reading) _____________________(2nd reading) p.m. _____________________(1st  reading) _____________________(2nd reading) This information is not intended to replace advice given to  you by your health care provider. Make sure you discuss any questions you have with your health care provider. Document Revised: 10/24/2020 Document Reviewed: 10/24/2020 Elsevier Patient Education  [place].

## 2022-02-27 ENCOUNTER — Encounter: Payer: Self-pay | Admitting: Nurse Practitioner

## 2022-03-02 ENCOUNTER — Other Ambulatory Visit: Payer: Self-pay | Admitting: Family Medicine

## 2022-03-04 ENCOUNTER — Telehealth: Payer: Self-pay | Admitting: *Deleted

## 2022-03-04 ENCOUNTER — Other Ambulatory Visit: Payer: Self-pay | Admitting: Family Medicine

## 2022-03-04 DIAGNOSIS — I5032 Chronic diastolic (congestive) heart failure: Secondary | ICD-10-CM

## 2022-03-04 NOTE — Telephone Encounter (Signed)
Patient informed and verbalized understanding of plan. 

## 2022-03-04 NOTE — Telephone Encounter (Signed)
-----   Message from Finis Bud, NP sent at 03/04/2022 10:45 AM EST ----- Case d/w Dr. Harl Bowie. He is recommending updating 2D Echo complete at this time for Hx and diagnosis of HFpEF. Let's have her complete this in the next week or two.   Thanks!   Best,  Finis Bud, NP

## 2022-03-10 ENCOUNTER — Telehealth: Payer: Self-pay | Admitting: Nurse Practitioner

## 2022-03-10 ENCOUNTER — Encounter: Payer: Medicare Other | Admitting: Internal Medicine

## 2022-03-10 NOTE — Telephone Encounter (Signed)
Received message from [facility] ,[place] that patient needs to have another echo per office notes of Dr. Harl Bowie and Finis Bud, NP. Patient had echo in December and she was in question as to why she needs another one. She wanted to know if her insurance would pay for another one. Sent message to percert coordinator Harle Stanford this was her comment on percert. No Josem Kaufmann is required for the echo's, but the insurance will make the final determination if it was medically necessary.Patient was informed about the insurance. She agreed to have it however she is in the bed with a virus. Appointment was scheduled for the end of the month.

## 2022-03-11 ENCOUNTER — Ambulatory Visit: Payer: Medicare Other | Attending: Nurse Practitioner

## 2022-03-11 DIAGNOSIS — I5032 Chronic diastolic (congestive) heart failure: Secondary | ICD-10-CM | POA: Diagnosis not present

## 2022-03-11 LAB — ECHOCARDIOGRAM COMPLETE
AR max vel: 1.69 cm2
AV Peak grad: 5.7 mmHg
AV Vena cont: 0.2 cm
Ao pk vel: 1.19 m/s
Calc EF: 58.6 %
MV M vel: 4.92 m/s
MV Peak grad: 96.8 mmHg
P 1/2 time: 2672 msec
Radius: 0.3 cm
S' Lateral: 2 cm
Single Plane A2C EF: 58.8 %
Single Plane A4C EF: 57.1 %

## 2022-03-11 NOTE — Telephone Encounter (Signed)
She needed repeat evalavuation of her RV function and pulmonary artery pressures now that she has been diuresed. There is a chance RV function and pressure may have improved with diuresis and decreased filling pressures alone. Agree with echo  Zandra Abts MD

## 2022-03-12 ENCOUNTER — Other Ambulatory Visit: Payer: Self-pay | Admitting: Family Medicine

## 2022-03-19 ENCOUNTER — Other Ambulatory Visit: Payer: Self-pay | Admitting: Family Medicine

## 2022-03-25 ENCOUNTER — Other Ambulatory Visit: Payer: Medicare Other

## 2022-03-30 ENCOUNTER — Telehealth: Payer: Self-pay | Admitting: Nurse Practitioner

## 2022-03-30 ENCOUNTER — Ambulatory Visit: Payer: Medicare Other | Attending: Nurse Practitioner | Admitting: Nurse Practitioner

## 2022-03-30 ENCOUNTER — Encounter: Payer: Self-pay | Admitting: Nurse Practitioner

## 2022-03-30 VITALS — BP 122/64 | HR 75 | Ht 67.0 in | Wt 196.4 lb

## 2022-03-30 DIAGNOSIS — I4891 Unspecified atrial fibrillation: Secondary | ICD-10-CM | POA: Diagnosis not present

## 2022-03-30 DIAGNOSIS — I5032 Chronic diastolic (congestive) heart failure: Secondary | ICD-10-CM

## 2022-03-30 DIAGNOSIS — R600 Localized edema: Secondary | ICD-10-CM

## 2022-03-30 DIAGNOSIS — I517 Cardiomegaly: Secondary | ICD-10-CM

## 2022-03-30 DIAGNOSIS — E049 Nontoxic goiter, unspecified: Secondary | ICD-10-CM

## 2022-03-30 DIAGNOSIS — I340 Nonrheumatic mitral (valve) insufficiency: Secondary | ICD-10-CM

## 2022-03-30 DIAGNOSIS — I071 Rheumatic tricuspid insufficiency: Secondary | ICD-10-CM

## 2022-03-30 DIAGNOSIS — Z79899 Other long term (current) drug therapy: Secondary | ICD-10-CM | POA: Diagnosis not present

## 2022-03-30 MED ORDER — TORSEMIDE 40 MG PO TABS: 60.0000 mg | ORAL_TABLET | Freq: Every day | ORAL | 3 refills | Status: DC

## 2022-03-30 MED ORDER — POTASSIUM CHLORIDE CRYS ER 20 MEQ PO TBCR: 30.0000 meq | EXTENDED_RELEASE_TABLET | Freq: Every day | ORAL | 3 refills | Status: DC

## 2022-03-30 NOTE — Progress Notes (Signed)
Cardiology Office Note:    Date:  03/30/2022  ID:  Danyela, Posas 04-15-1940, MRN [id number]  PCP:  Fayrene Helper, MD   [facility] Cardiologist:  Chalmers Guest, MD     Referring MD: Fayrene Helper, MD   CC: Here for follow-up  History of Present Illness:    Analeise CHEVELLE COULSON is a 82 y.o. female with a hx of the following:  Hx of PE (chronic anticoagulation) Hypertension New onset A-fib (Dx 01/2022) HFpEF TR GERD History of breast cancer Type II HSV infection of vulvovaginal region Obesity Hyperlipidemia Vertigo Depression  Patient is a very pleasant 82 year old female with past medical history as mentioned above.  Underwent CCTA in [place], [place] in 2009 that demonstrated normal coronary arteries.  Previous patient of Dr. Bronson Ing.  Was evaluated in 2016 for evaluation of dizziness.  Denied any chest pain at that time.  She stated she seldom got dizzy and did not have to take meclizine in the recent past.  Had scarlet fever as a child.  EKG revealed normal sinus rhythm, no ischemic changes.  No medication changes were made.  Was told to follow-up as needed.  Admission in December 2023 for CHF exacerbation. Dx with A-fib 01/26/2022 prior to hospitalization.  Chest x-ray in ED showed small bilateral pleural effusions with overlying atelectasis.  CTA of chest was negative for PE, showed findings suggestive of right-sided dysfunction with small right-sided pleural effusions.  BNP 819.  Echocardiogram during admission showed normal EF, D-shaped left ventricle, consistent with right ventricular hyper bulimia right ventricular enlargement, left and right atrial size severely dilated, mild MR.  Received IV Lasix and switched to torsemide 40 mg daily.  Troponins low and flat.  Cardiology recommended to hold off beta-blockers due to mild bradycardia.  Would likely start SGLT2 inhibitor outpatient follow-up because the reason for her.   Continued on Eliquis.  Discharged in stable condition on 01/30/2022.  Was told to follow-up with cardiology outpatient.  02/25/2022 - Saw her for hospital follow-up. Saw her PCP who placed her on Metoprolol Tartrate 12.5 mg BID for her A-fib. Was doing well. Admitted to goiter on her thyroid, not currently being managed. Started on Farxiga 10 mg daily. Note routed to Dr. Harl Bowie who recommended repeating Echo to evaluate RV enlargement. Echo revealed normal EF, no RWMA, mild LVH, indeterminate left ventricular diastolic parameters, findings consistent with right ventricular volume overload, mildly elevated PASP, estimated RVSP was 42.2 mmHg.  Had ED visit at [facility] on 03/26/2022 with leg swelling, rest of her PE was unremarkable.  Blood work was unremarkable.  CXR was clear of pleural effusions or interstitial edema.  She was given 80 mg of Lasix and diuresed well.  Was discharged later in stable condition.  03/30/2022 - Here for follow-up. Doing well. Swelling has improved after she was given Lasix in ED. Denies any chest pain, shortness of breath, palpitations, syncope, presyncope, dizziness, orthopnea, PND, swelling or significant weight changes, acute bleeding, or claudication.  Tolerating her medications well.   Past Medical History:  Diagnosis Date   (HFpEF) heart failure with preserved ejection fraction (HCC)    A-fib ([place])    Dx: 01/2022   Breast cancer ([facility]) 02/24/2000   Bronchitis    Cancer (HCC)    Chronic back pain    Depression    GERD (gastroesophageal reflux disease)    Hyperlipidemia    Hypertension    Insomnia    Kidney stone  Personal history of chemotherapy    Personal history of radiation therapy    Pneumonia    Thyroid goiter    Vertigo     Past Surgical History:  Procedure Laterality Date   ABDOMINAL HYSTERECTOMY     BREAST LUMPECTOMY Left 2002   BREAST SURGERY     left   CYSTOSCOPY/RETROGRADE/URETEROSCOPY/STONE EXTRACTION WITH BASKET  01/07/2011    Procedure: CYSTOSCOPY/RETROGRADE/URETEROSCOPY/STONE EXTRACTION WITH BASKET;  Surgeon: Marissa Nestle;  Location: [facility];  Service: Urology;  Laterality: Left;  Balloon Dilatation; stone given to family per MD   TUBAL LIGATION      Current Meds  Medication Sig   acetaminophen (TYLENOL) 500 MG tablet Take 1,000 mg by mouth every 6 (six) hours as needed for mild pain or moderate pain.    acyclovir (ZOVIRAX) 400 MG tablet TAKE 1 TABLET BY MOUTH TWICE  DAILY   alendronate (FOSAMAX) 70 MG tablet TAKE 1 TABLET BY MOUTH EVERY 7  DAYS WITH A FULL GLASS OF WATER  ON AN EMPTY STOMACH   amLODipine (NORVASC) 5 MG tablet Take 1 tablet (5 mg total) by mouth daily.   atorvastatin (LIPITOR) 20 MG tablet TAKE 1 TABLET EVERY EVENING   Calcium Carb-Cholecalciferol (CALTRATE 600+D3) 600-20 MG-MCG TABS Take 1 tablet by mouth daily. Take one tablet by mouth one time daily   dapagliflozin propanediol (FARXIGA) 10 MG TABS tablet Take 1 tablet (10 mg total) by mouth daily before breakfast.   Elastic Bandages & Supports MISC 1 Package by Does not apply route as directed.   ELIQUIS 5 MG TABS tablet TAKE 1 TABLET BY MOUTH TWICE  DAILY   FLUoxetine (PROZAC) 10 MG capsule TAKE 1 CAPSULE BY MOUTH DAILY   meclizine (ANTIVERT) 25 MG tablet TAKE 1 TABLET BY MOUTH THREE TIMES DAILY AS NEEDED FOR DIZZINESS   metoprolol tartrate (LOPRESSOR) 25 MG tablet Take 12.5 mg by mouth 2 (two) times daily.   Misc. Devices ([place]) MISC 1 each by Does not apply route daily as needed.   temazepam (RESTORIL) 30 MG capsule TAKE 1 CAPSULE BY MOUTH EVERY  NIGHT AT BEDTIME   vitamin C (ASCORBIC ACID) 500 MG tablet Take 500 mg by mouth daily.   Vitamin D, Ergocalciferol, (DRISDOL) 1.25 MG (50000 UNIT) CAPS capsule TAKE 1 CAPSULE BY MOUTH ONCE  WEEKLY   potassium chloride SA (KLOR-CON M) 20 MEQ tablet Take 1 tablet (20 mEq total) by mouth daily.    Allergies:   Meclizine and Penicillins   Social History   Socioeconomic History    Marital status: Divorced    Spouse name: Not on file   Number of children: 2   Years of education: Not on file   Highest education level: Not on file  Occupational History   Not on file  Tobacco Use   Smoking status: Former    Packs/day: 1.00    Years: 5.00    Total pack years: 5.00    Types: Cigarettes    Quit date: 09/07/1980    Years since quitting: 41.5   Smokeless tobacco: Never   Tobacco comments:    quit 25+ years ago  Vaping Use   Vaping Use: Never used  Substance and Sexual Activity   Alcohol use: No    Alcohol/week: 0.0 standard drinks of alcohol   Drug use: No   Sexual activity: Not Currently  Other Topics Concern   Not on file  Social History Narrative   Not on file   Social Determinants of  Health   Financial Resource Strain: Low Risk  (10/03/2020)   Overall Financial Resource Strain (CARDIA)    Difficulty of Paying Living Expenses: Not hard at all  Food Insecurity: No Food Insecurity (10/03/2020)   Hunger Vital Sign    Worried About Running Out of Food in the Last Year: Never true    Ran Out of Food in the Last Year: Never true  Transportation Needs: No Transportation Needs (10/03/2020)   PRAPARE - Hydrologist (Medical): No    Lack of Transportation (Non-Medical): No  Physical Activity: Inactive (10/03/2020)   Exercise Vital Sign    Days of Exercise per Week: 0 days    Minutes of Exercise per Session: 0 min  Stress: No Stress Concern Present (10/03/2020)   [place]    Feeling of Stress : Not at all  Social Connections: Moderately Isolated (10/03/2020)   Social Connection and Isolation Panel [NHANES]    Frequency of Communication with Friends and Family: More than three times a week    Frequency of Social Gatherings with Friends and Family: Twice a week    Attends Religious Services: More than 4 times per year    Active Member of [employer] or Organizations: No     Attends Music therapist: Never    Marital Status: Divorced     Family History: The patient's family history includes Aneurysm in her brother; Bone cancer in her brother; COPD in her father, sister, and sister; Diabetes in her brother and sister; HIV/AIDS in her brother; Heart disease in her brother; Seizures in her mother.  ROS:   Review of Systems  Constitutional: Negative.   HENT: Negative.         Goiter on thyroid. See HPI.   Eyes: Negative.   Respiratory: Negative.    Cardiovascular: Negative.   Gastrointestinal: Negative.   Genitourinary: Negative.   Musculoskeletal: Negative.   Skin: Negative.   Neurological: Negative.   Endo/Heme/Allergies: Negative.   Psychiatric/Behavioral: Negative.      Please see the history of present illness.    All other systems reviewed and are negative.  EKGs/Labs/Other Studies Reviewed:    The following studies were reviewed today:   EKG:  EKG is not ordered today.   Echocardiogram on 03/11/2022:   1. Left ventricular ejection fraction, by estimation, is 60 to 65%. The  left ventricle has normal function. The left ventricle has no regional  wall motion abnormalities. There is mild concentric left ventricular  hypertrophy. Left ventricular diastolic  parameters are indeterminate. There is the interventricular septum is  flattened in diastole ('D' shaped left ventricle), consistent with right  ventricular volume overload. The average left ventricular global  longitudinal strain is -20.3 %. The global  longitudinal strain is normal.   2. Right ventricular systolic function is normal. The right ventricular  size is moderately enlarged. There is mildly elevated pulmonary artery  systolic pressure. The estimated right ventricular systolic pressure is  90.3 mmHg.   3. Right atrial size was mildly dilated.   4. The mitral valve is mildly degenerative. Mild to moderate mitral valve  regurgitation, posteriorly directed.   5.  The tricuspid valve is abnormal with incomplete coaptation due to  annular dilatation. Tricuspid valve regurgitation is severe.   6. The aortic valve is tricuspid. Aortic valve regurgitation is trivial.  Aortic valve sclerosis is present, with no evidence of aortic valve  stenosis.   7. The  inferior vena cava is dilated in size with <50% respiratory  variability, suggesting right atrial pressure of 15 mmHg.   Comparison(s): Prior images reviewed side by side. Moderate RV enlargement  with tricuspid annular dilatation and severe tricuspid regurgitation. At  least mildly elevated estimated RVSP, could be underestimated.  Echocardiogram on January 28, 2022: 1. Left ventricular ejection fraction, by estimation, is 60 to 65%. The  left ventricle has normal function. The left ventricle has no regional  wall motion abnormalities. There is mild left ventricular hypertrophy.  Left ventricular diastolic function  could not be evaluated. There is the interventricular septum is flattened  in diastole ('D' shaped left ventricle), consistent with right ventricular  volume overload.   2. Right ventricular systolic function not well visualized but appears to  be normal. The right ventricular size is moderately enlarged.   3. Left atrial size was severely dilated.   4. Right atrial size was severely dilated.   5. The mitral valve is degenerative. Mild mitral valve regurgitation. No  evidence of mitral stenosis.   6. There is malcoaptation of tricuspid valve leaflets. The tricuspid  valve is abnormal. There is severe atrial functional tricuspid valve  regurgitation. Pulmonary artery systolic pressure cannot be calculated due  to rapid pressure equilibration between  right atrium and right ventricle.   7. The aortic valve is tricuspid. There is moderate calcification of the  aortic valve. Aortic valve regurgitation is mild. No aortic stenosis is  present.   8. The inferior vena cava is dilated in size  with <50% respiratory  variability, suggesting right atrial pressure of 15 mmHg.   Comparison(s): No prior Echocardiogram.  CT angio chest (PE) on January 27, 2022: IMPRESSION: 1. No evidence of pulmonary embolism. 2. Small right-sided pleural effusion. 3. Right atrial enlargement with reflux of contrast into the hepatic veins. Findings can be seen in the setting of right heart dysfunction. Correlate with echocardiography.   2 view chest x-ray on January 27, 2022: Small bilateral pleural effusions with overlying atelectasis   Thyroid ultrasound on Jul 15, 2021: Enlarged multinodular thyroid gland.  A number of small and/or benign-appearing thyroid nodules as described do not meet criteria for further dedicated follow-up or biopsy.   Recent Labs: 01/27/2022: ALT 30; Hemoglobin 11.4; Platelets 243; TSH 0.557 01/29/2022: Magnesium 1.7 01/30/2022: B Natriuretic Peptide 403.0 02/06/2022: BUN 27; Creatinine, Ser 1.16; Potassium 3.4; Sodium 142  Recent Lipid Panel    Component Value Date/Time   CHOL 149 07/04/2021 1341   TRIG 96 07/04/2021 1341   HDL 53 07/04/2021 1341   CHOLHDL 2.8 07/04/2021 1341   CHOLHDL 2.5 04/25/2019 1053   VLDL 21 09/03/2016 1343   LDLCALC 78 07/04/2021 1341   LDLCALC 79 04/25/2019 1053     Risk Assessment/Calculations:    CHA2DS2-VASc Score = 7   This indicates a 11.2% annual risk of stroke. The patient's score is based upon: CHF History: 1 HTN History: 1 Diabetes History: 0 Stroke History: 2 Vascular Disease History: 0 Age Score: 2 Gender Score: 1   The 10-year ASCVD risk score (Arnett DK, et al., 2019) is: 13.2%   Values used to calculate the score:     Age: 31 years     Sex: Female     Is Non-Hispanic African American: Yes     Diabetic: No     Tobacco smoker: No     Systolic Blood Pressure: 938 mmHg     Is BP treated: Yes     HDL Cholesterol:  53 mg/dL     Total Cholesterol: 149 mg/dL   Physical Exam:    VS:  BP 122/64   Pulse 75    Ht '5\' 7"'$  (1.702 m)   Wt 196 lb 6.4 oz (89.1 kg)   SpO2 96%   BMI 30.76 kg/m     Wt Readings from Last 3 Encounters:  03/30/22 196 lb 6.4 oz (89.1 kg)  02/25/22 196 lb 3.2 oz (89 kg)  02/06/22 194 lb 1.3 oz (88 kg)     GEN: Obese, 82 y.o. female in no acute distress HEENT: Normal NECK: No JVD; No carotid bruits, thyroid goiter noted along right lobe of thyroid CARDIAC: S1/S2, irregular rhythm and regular rate, no murmurs, rubs, gallops; 2+ pulses throughout RESPIRATORY:  Clear to auscultation without rales, wheezing or rhonchi  MUSCULOSKELETAL:  trace, nonpitting edema along bilateral lower legs; No deformity  SKIN: Warm and dry NEUROLOGIC:  Alert and oriented x 3 PSYCHIATRIC:  Normal affect   ASSESSMENT:    1. Chronic heart failure with preserved ejection fraction ([place])   2. Medication management   3. Atrial fibrillation, unspecified type ([place])   4. Nonrheumatic mitral valve regurgitation   5. Severe tricuspid regurgitation   6. Right ventricular enlargement   7. Thyroid goiter   8. Leg edema    PLAN:    In order of problems listed above:  HFpEF Hospitalized 01/2022 with CHF exacerbation. Echocardiogram showed normal EF, no RWMA, mild LVH, left ventricular diastolic function could not be evaluated.  The interventricular septum was flattened in diastole with left ventricle shaped and D formation, findings consistent with right ventricular volume overload.  Received IV Lasix and was switched to p.o. torsemide. Repeat echo showed consistent findings with right ventricular volume overload. Euvolemic and well compensated on exam. Low sodium diet, fluid restriction <2L, and daily weights encouraged. Educated to contact our office for weight gain of 2 lbs overnight or 5 lbs in one week. Heart healthy diet and regular cardiovascular exercise encouraged. Will increase Torsemide to 60 mg daily and increase Potassium to 30 mEq daily, repeat BMET in 1 week. Continue Farxiga and Lopressor.  Consider starting low does Entresto at next OV.   A-fib Diagnosed with A-fib prior to hospital visit in 01/2022.  Cardiology recommended to hold off AV nodal agents/beta-blockers due to mild bradycardia; however, she is tolerating low dose Lopressor well.  Heart rate well-controlled today.  Continue Eliquis 5 mg twice daily for CHA2DS2-VASc score of 7 and current medication regimen.  Denies any bleeding issues and on appropriate dosage. Heart healthy diet and regular cardiovascular exercise encouraged.   MR, TR, RV enlargement Denies any symptoms. Echo 02/2022 revealed mild to moderate MR without stenosis and mal coaptation of the tricuspid valve leaflets, severe atrial functional tricuspid valve regurgitation, PASP cannot be calculated.  Dr. Carlyle Dolly was consulted previously and stated this was suspected due to long standing severe elevated left-sided pressures as suggested by severe BAE.  Dr. Carlyle Dolly recommended that extensive workup for other causes of RV enlargement beyond severe longstanding diastolic dysfunction could be considered on outpatient basis. He recommended repeat Echo - results mentioned above.  Increasing Torsemide and potassium.   Thyroid goiter Noted on exam at patient's right lobe. Thyroid ultrasound on Jul 15, 2021 revealed enlarged multinodular thyroid gland.  A number of small and/or benign-appearing thyroid nodules did not meet criteria for further dedicated follow-up or biopsy. Continue to follow with PCP.   5. Leg edema Improved edema along bilateral  lower legs. Continue wearing compression stockings. Low salt, heart healthy diet recommended. Leg elevation also recommended.   5. Disposition: Follow up with me in 4-6 weeks or sooner if anything changes.    Medication Adjustments/Labs and Tests Ordered: Current medicines are reviewed at length with the patient today.  Concerns regarding medicines are outlined above.  Orders Placed This Encounter  Procedures    Basic metabolic panel   Meds ordered this encounter  Medications   Torsemide 40 MG TABS    Sig: Take 60 mg by mouth daily.    Dispense:  90 tablet    Refill:  3   potassium chloride SA (KLOR-CON M) 20 MEQ tablet    Sig: Take 1.5 tablets (30 mEq total) by mouth daily.    Dispense:  135 tablet    Refill:  3    Patient Instructions  Medication Instructions:  Your physician has recommended you make the following change in your medication:  -Increase Torsemide to 60 mg daily -Increase Potassium to 30 mEq daily   Labwork: In 1 week: -BMET  Testing/Procedures: None  Follow-Up: Follow up with Finis Bud, NP in 4-6 weeks.   Any Other Special Instructions Will Be Listed Below (If Applicable).     If you need a refill on your cardiac medications before your next appointment, please call your pharmacy.   Your physician has requested that you regularly monitor and record your blood pressure readings at home. Please use the same machine at the same time of day to check your readings and record them to bring to your follow-up visit.   Your physician recommends that you weigh, daily, at the same time every day, and in the same amount of clothing. Please record your daily weights on the handout provided and bring it to your next appointment.     Signed, Finis Bud, NP  03/31/2022 4:15 PM    [place]

## 2022-03-30 NOTE — Telephone Encounter (Signed)
Pt c/o medication issue:  1. Name of Medication:  Torsemide 40 MG TABS    2. How are you currently taking this medication (dosage and times per day)?   3. Are you having a reaction (difficulty breathing--STAT)?   4. What is your medication issue? Pharmacy called in stating they don't have '60mg'$  tablets, but they do have 20 mg.. they want to know if its okay for pt to take 3x day

## 2022-03-30 NOTE — Patient Instructions (Signed)
Medication Instructions:  Your physician has recommended you make the following change in your medication:  -Increase Torsemide to 60 mg daily -Increase Potassium to 30 mEq daily   Labwork: In 1 week: -BMET  Testing/Procedures: None  Follow-Up: Follow up with Finis Bud, NP in 4-6 weeks.   Any Other Special Instructions Will Be Listed Below (If Applicable).     If you need a refill on your cardiac medications before your next appointment, please call your pharmacy.   Your physician has requested that you regularly monitor and record your blood pressure readings at home. Please use the same machine at the same time of day to check your readings and record them to bring to your follow-up visit.   Your physician recommends that you weigh, daily, at the same time every day, and in the same amount of clothing. Please record your daily weights on the handout provided and bring it to your next appointment.

## 2022-03-30 NOTE — Telephone Encounter (Signed)
Notified pharmacy that 20 mg tablets are okay for patient.

## 2022-04-01 ENCOUNTER — Other Ambulatory Visit: Payer: Self-pay | Admitting: Family Medicine

## 2022-04-02 ENCOUNTER — Encounter: Payer: Self-pay | Admitting: Family Medicine

## 2022-04-02 ENCOUNTER — Ambulatory Visit (INDEPENDENT_AMBULATORY_CARE_PROVIDER_SITE_OTHER): Payer: Medicare Other | Admitting: Family Medicine

## 2022-04-02 VITALS — BP 130/75 | HR 82 | Ht 67.0 in | Wt 194.0 lb

## 2022-04-02 DIAGNOSIS — Z09 Encounter for follow-up examination after completed treatment for conditions other than malignant neoplasm: Secondary | ICD-10-CM | POA: Diagnosis not present

## 2022-04-02 DIAGNOSIS — I509 Heart failure, unspecified: Secondary | ICD-10-CM | POA: Insufficient documentation

## 2022-04-02 DIAGNOSIS — I10 Essential (primary) hypertension: Secondary | ICD-10-CM | POA: Diagnosis not present

## 2022-04-02 DIAGNOSIS — E559 Vitamin D deficiency, unspecified: Secondary | ICD-10-CM | POA: Diagnosis not present

## 2022-04-02 DIAGNOSIS — I5032 Chronic diastolic (congestive) heart failure: Secondary | ICD-10-CM | POA: Diagnosis not present

## 2022-04-02 DIAGNOSIS — E669 Obesity, unspecified: Secondary | ICD-10-CM

## 2022-04-02 DIAGNOSIS — F418 Other specified anxiety disorders: Secondary | ICD-10-CM

## 2022-04-02 DIAGNOSIS — I5030 Unspecified diastolic (congestive) heart failure: Secondary | ICD-10-CM | POA: Insufficient documentation

## 2022-04-02 DIAGNOSIS — E782 Mixed hyperlipidemia: Secondary | ICD-10-CM

## 2022-04-02 NOTE — Patient Instructions (Addendum)
Follow-up in May as before, call if you need me sooner.  Please note that the dose of your torsemide is increased to 1-1/2 tablets daily also the dose of your potassium is increased to 1-1/2 tablets daily.  This change was made by the cardiologist 3 days ago, it is important that you follow the change.  Fasting lipid, cmp and eGFr  and vit  D 5 days before May appointment  Please come and get kidney function blood test drawn next week Thursday as ordered by your cardiologist.  Continue to weigh daily and keep an eye on your feet so that you do not go into heart failure again.  Important to limit fluid intake to 1.8 L/day.  Important to cut food with sodium which includes canned foods and sodas.  Thanks for choosing [facility], we consider it a privelige to serve you.

## 2022-04-02 NOTE — Assessment & Plan Note (Signed)
Recovered clinically from recent acute event when she was treated in the ED on 03/26/2022, needs to take prescribed diuretic at dose prescribed, Nurse spoke with sister responsible for her care and med already prescribed by Cardiology 3 days ago and needs to be filled, torsemide and Potassium

## 2022-04-02 NOTE — Progress Notes (Signed)
Theresa Garcia     MRN: [id number]      DOB: 23-Feb-1941   HPI Theresa Garcia is here for follow up and re-evaluation of chronic medical conditions, medication management and review of any available recent lab and radiology data.  Was in the ED in [place] on 03/26/2022, and viast is for follow up primarily Hospital notes reviewed of significance BNP was markedly elevated, cxr did not show chf. Lasix 80 mg Im was administered, states leg swelling down and she feels better Was evaluated by  Cardiology, routine f/u 3 days ago, when we double checked with her sister who manages her meds, she is NOT taking the current diuretic torsemide and has not followed direction to inc potassium dose , we have attempted to clarify this vis telephone, pt has not brought her meds to the visit I will message Card re recent BNP in the ED also ROS Denies recent fever or chills. Denies sinus pressure, nasal congestion, ear pain or sore throat. Denies chest congestion, productive cough or wheezing. Denies chest pains, palpitations and leg swelling Denies abdominal pain, nausea, vomiting,diarrhea or constipation.   Denies dysuria, frequency, hesitancy or incontinence. Denies uncontrolled joint pain, swelling and limitation in mobility. Denies headaches, seizures, numbness, or tingling. Denies uncontrolled depression, anxiety or insomnia. Denies skin break down or rash.   PE  BP 130/75 (BP Location: Right Arm, Patient Position: Sitting, Cuff Size: Large)   Pulse 82   Ht '5\' 7"'$  (1.702 m)   Wt 194 lb (88 kg)   SpO2 94%   BMI 30.38 kg/m   Patient alert and oriented and in no cardiopulmonary distress.  HEENT: No facial asymmetry, EOMI,     Neck decreased though adequate ROM.  Chest: Clear to auscultation bilaterally.Decreased air entry  CVS: S1, S2 systolic  murmurs, no S3.Ir oRegular rate.  ABD: Soft non tender.   Ext: No edema  MS: Adequate though reduced  ROM spine, shoulders, hips and reduced in left  knee.  Skin: Intact, no ulcerations or rash noted.  Psych: Good eye contact, normal affect. Memory intact not anxious or depressed appearing.  CNS: CN 2-12 intact, power,  normal throughout.no focal deficits noted.   Assessment & Plan  Chronic heart failure with preserved ejection fraction (HFpEF) (HCC) Recovered clinically from recent acute event when she was treated in the ED on 03/26/2022, needs to take prescribed diuretic at dose prescribed, Nurse spoke with sister responsible for her care and med already prescribed by Cardiology 3 days ago and needs to be filled, torsemide and Potassium  Encounter for examination following treatment at hospital Patient in for follow up of recent ED visit Discharge summary, and laboratory and radiology data are reviewed, and any questions or concerns  are discussed. Specific issues requiring follow up are specifically addressed.   Hyperlipidemia Hyperlipidemia:Low fat diet discussed and encouraged.   Lipid Panel  Lab Results  Component Value Date   CHOL 149 07/04/2021   HDL 53 07/04/2021   LDLCALC 78 07/04/2021   TRIG 96 07/04/2021   CHOLHDL 2.8 07/04/2021     Updated lab needed at/ before next visit.   Essential hypertension Controlled, no change in medication DASH diet and commitment to daily physical activity for a minimum of 30 minutes discussed and encouraged, as a part of hypertension management. The importance of attaining a healthy weight is also discussed.     04/02/2022    8:53 AM 03/30/2022    2:11 PM 02/25/2022    2:07  PM 02/06/2022    8:47 AM 01/30/2022    6:09 AM 01/30/2022    5:30 AM 01/29/2022    9:33 PM  BP/Weight  Systolic BP 110 211 173 567 014  103  Diastolic BP 75 64 64 80 82  62  Wt. (Lbs) 194 196.4 196.2 194.08  195.77   BMI 30.38 kg/m2 30.76 kg/m2 31.67 kg/m2 30.4 kg/m2  30.66 kg/m2        Obesity (BMI 30.0-34.9)  Patient re-educated about  the importance of commitment to a  minimum of 150 minutes of  exercise per week as able.  The importance of healthy food choices with portion control discussed, as well as eating regularly and within a 12 hour window most days. The need to choose "clean , green" food 50 to 75% of the time is discussed, as well as to make water the primary drink and set a goal of 64 ounces water daily.       04/02/2022    8:53 AM 03/30/2022    2:11 PM 02/25/2022    2:07 PM  Weight /BMI  Weight 194 lb 196 lb 6.4 oz 196 lb 3.2 oz  Height '5\' 7"'$  (1.702 m) '5\' 7"'$  (1.702 m) '5\' 6"'$  (1.676 m)  BMI 30.38 kg/m2 30.76 kg/m2 31.67 kg/m2      Depression with anxiety Controlled, no change in medication

## 2022-04-02 NOTE — Assessment & Plan Note (Signed)
  Patient re-educated about  the importance of commitment to a  minimum of 150 minutes of exercise per week as able.  The importance of healthy food choices with portion control discussed, as well as eating regularly and within a 12 hour window most days. The need to choose "clean , green" food 50 to 75% of the time is discussed, as well as to make water the primary drink and set a goal of 64 ounces water daily.       04/02/2022    8:53 AM 03/30/2022    2:11 PM 02/25/2022    2:07 PM  Weight /BMI  Weight 194 lb 196 lb 6.4 oz 196 lb 3.2 oz  Height '5\' 7"'$  (1.702 m) '5\' 7"'$  (1.702 m) '5\' 6"'$  (1.676 m)  BMI 30.38 kg/m2 30.76 kg/m2 31.67 kg/m2

## 2022-04-02 NOTE — Assessment & Plan Note (Signed)
Hyperlipidemia:Low fat diet discussed and encouraged.   Lipid Panel  Lab Results  Component Value Date   CHOL 149 07/04/2021   HDL 53 07/04/2021   LDLCALC 78 07/04/2021   TRIG 96 07/04/2021   CHOLHDL 2.8 07/04/2021     Updated lab needed at/ before next visit.

## 2022-04-02 NOTE — Assessment & Plan Note (Signed)
Patient in for follow up of recent ED visit. Discharge summary, and laboratory and radiology data are reviewed, and any questions or concerns  are discussed. Specific issues requiring follow up are specifically addressed.  

## 2022-04-02 NOTE — Assessment & Plan Note (Signed)
Controlled, no change in medication DASH diet and commitment to daily physical activity for a minimum of 30 minutes discussed and encouraged, as a part of hypertension management. The importance of attaining a healthy weight is also discussed.     04/02/2022    8:53 AM 03/30/2022    2:11 PM 02/25/2022    2:07 PM 02/06/2022    8:47 AM 01/30/2022    6:09 AM 01/30/2022    5:30 AM 01/29/2022    9:33 PM  BP/Weight  Systolic BP 692 230 097 949 971  820  Diastolic BP 75 64 64 80 82  62  Wt. (Lbs) 194 196.4 196.2 194.08  195.77   BMI 30.38 kg/m2 30.76 kg/m2 31.67 kg/m2 30.4 kg/m2  30.66 kg/m2

## 2022-04-02 NOTE — Assessment & Plan Note (Signed)
Controlled, no change in medication  

## 2022-04-23 ENCOUNTER — Encounter: Payer: Self-pay | Admitting: Radiology

## 2022-04-28 ENCOUNTER — Encounter: Payer: Medicare Other | Admitting: Internal Medicine

## 2022-04-28 NOTE — Progress Notes (Signed)
Erroneous encounter - please disregard.

## 2022-06-03 ENCOUNTER — Other Ambulatory Visit: Payer: Self-pay | Admitting: Family Medicine

## 2022-06-15 ENCOUNTER — Telehealth: Payer: Self-pay | Admitting: Family Medicine

## 2022-06-15 NOTE — Telephone Encounter (Signed)
Spoke with pt sister and she will reach out to Cardiology to get this sent in.

## 2022-06-15 NOTE — Telephone Encounter (Signed)
Pt sister called on on patient behalf   Prescription Request  06/15/2022  LOV: 04/02/2022  What is the name of the medication or equipment?  furosemide (LASIX) injection 40 mg     Have you contacted your pharmacy to request a refill? No   Which pharmacy would you like this sent to?  [facility] [address] [place] - [address] [place] [address] Phone: [phone] Fax: [phone]  [facility]IV[facility] - [place], [place] - [address] [place] [address] Phone: [phone] Fax: [phone]  [facility] - [place], [place] - [address] [place] [address] Phone: [phone] Fax: [phone]  [facility] - [place], [place] - [address] [place] [address] Phone: [phone] Fax: [phone]  [facility] - [place], [place] - [address] [place] [address] Phone: [phone] Fax: [phone]    Patient notified that their request is being sent to the clinical staff for review and that they should receive a response within 2 business days.   Please advise at Mobile There is no such number on file (mobile).

## 2022-06-22 ENCOUNTER — Telehealth: Payer: Self-pay

## 2022-06-22 NOTE — Transitions of Care (Post Inpatient/ED Visit) (Signed)
   06/22/2022  Name: Theresa Garcia MRN: [id number] DOB: 05-12-1940  Today's TOC FU Call Status: Today's TOC FU Call Status:: Successful TOC FU Call Competed TOC FU Call Complete Date: 06/22/22  Transition Care Management Follow-up Telephone Call Date of Discharge: 06/19/22 [facility] Type of Discharge: Emergency Department Reason for ED Visit: Other: How have you been since you were released from the hospital?: Better Any questions or concerns?: No  Items Reviewed: Did you receive and understand the discharge instructions provided?: Yes Medications obtained and verified?: Yes (Medications Reviewed) Any new allergies since your discharge?: No Dietary orders reviewed?: NA Do you have support at home?: Yes  Home Care and Equipment/Supplies: Were Home Health Services Ordered?: NA Any new equipment or medical supplies ordered?: NA  Functional Questionnaire: Do you need assistance with bathing/showering or dressing?: No Do you need assistance with meal preparation?: No Do you need assistance with eating?: No Do you have difficulty maintaining continence: No Do you need assistance with getting out of bed/getting out of a chair/moving?: No Do you have difficulty managing or taking your medications?: No  Follow up appointments reviewed: PCP Follow-up appointment confirmed?: Yes Date of PCP follow-up appointment?: 06/30/22 Follow-up Provider: Dr. Lodema Hong Specialist [facility] Follow-up appointment confirmed?: NA Do you need transportation to your follow-up appointment?: No Do you understand care options if your condition(s) worsen?: Yes-patient verbalized understanding    SIGNATURE  Agnes Lawrence, CMA (AAMA)  [facility] [phone]

## 2022-06-30 ENCOUNTER — Other Ambulatory Visit (HOSPITAL_COMMUNITY)
Admission: RE | Admit: 2022-06-30 | Discharge: 2022-06-30 | Disposition: A | Discharge status: Home / Self Care | Payer: Medicare Other | Source: Ambulatory Visit | Attending: Family Medicine | Admitting: Family Medicine

## 2022-06-30 ENCOUNTER — Ambulatory Visit (INDEPENDENT_AMBULATORY_CARE_PROVIDER_SITE_OTHER): Payer: Medicare Other | Admitting: Family Medicine

## 2022-06-30 ENCOUNTER — Ambulatory Visit (HOSPITAL_COMMUNITY)
Admission: RE | Admit: 2022-06-30 | Discharge: 2022-06-30 | Disposition: A | Discharge status: Home / Self Care | Payer: Medicare Other | Source: Ambulatory Visit | Attending: Family Medicine | Admitting: Family Medicine

## 2022-06-30 ENCOUNTER — Encounter: Payer: Self-pay | Admitting: Family Medicine

## 2022-06-30 VITALS — BP 114/76 | HR 86 | Temp 97.7°F | Ht 67.0 in | Wt 194.0 lb

## 2022-06-30 DIAGNOSIS — R0602 Shortness of breath: Secondary | ICD-10-CM | POA: Insufficient documentation

## 2022-06-30 DIAGNOSIS — R110 Nausea: Secondary | ICD-10-CM | POA: Diagnosis not present

## 2022-06-30 DIAGNOSIS — R052 Subacute cough: Secondary | ICD-10-CM

## 2022-06-30 DIAGNOSIS — R197 Diarrhea, unspecified: Secondary | ICD-10-CM

## 2022-06-30 DIAGNOSIS — A6004 Herpesviral vulvovaginitis: Secondary | ICD-10-CM

## 2022-06-30 DIAGNOSIS — F418 Other specified anxiety disorders: Secondary | ICD-10-CM

## 2022-06-30 DIAGNOSIS — I50811 Acute right heart failure: Secondary | ICD-10-CM | POA: Diagnosis not present

## 2022-06-30 DIAGNOSIS — I509 Heart failure, unspecified: Secondary | ICD-10-CM | POA: Insufficient documentation

## 2022-06-30 DIAGNOSIS — R195 Other fecal abnormalities: Secondary | ICD-10-CM

## 2022-06-30 DIAGNOSIS — N029 Recurrent and persistent hematuria with unspecified morphologic changes: Secondary | ICD-10-CM

## 2022-06-30 DIAGNOSIS — R300 Dysuria: Secondary | ICD-10-CM

## 2022-06-30 DIAGNOSIS — Z09 Encounter for follow-up examination after completed treatment for conditions other than malignant neoplasm: Secondary | ICD-10-CM

## 2022-06-30 DIAGNOSIS — I110 Hypertensive heart disease with heart failure: Secondary | ICD-10-CM | POA: Diagnosis not present

## 2022-06-30 LAB — CBC WITH DIFFERENTIAL/PLATELET
Abs Immature Granulocytes: 0.03 10*3/uL (ref 0.00–0.07)
Basophils Absolute: 0 10*3/uL (ref 0.0–0.1)
Basophils Relative: 0 %
Eosinophils Absolute: 0.1 10*3/uL (ref 0.0–0.5)
Eosinophils Relative: 1 %
HCT: 38.9 % (ref 36.0–46.0)
Hemoglobin: 12.6 g/dL (ref 12.0–15.0)
Immature Granulocytes: 0 %
Lymphocytes Relative: 27 %
Lymphs Abs: 1.9 10*3/uL (ref 0.7–4.0)
MCH: 30.4 pg (ref 26.0–34.0)
MCHC: 32.4 g/dL (ref 30.0–36.0)
MCV: 94 fL (ref 80.0–100.0)
Monocytes Absolute: 0.6 10*3/uL (ref 0.1–1.0)
Monocytes Relative: 8 %
Neutro Abs: 4.6 10*3/uL (ref 1.7–7.7)
Neutrophils Relative %: 64 %
Platelets: 224 10*3/uL (ref 150–400)
RBC: 4.14 MIL/uL (ref 3.87–5.11)
RDW: 16.1 % — ABNORMAL HIGH (ref 11.5–15.5)
WBC: 7.3 10*3/uL (ref 4.0–10.5)
nRBC: 0 % (ref 0.0–0.2)

## 2022-06-30 LAB — BASIC METABOLIC PANEL
Anion gap: 10 (ref 5–15)
BUN: 31 mg/dL — ABNORMAL HIGH (ref 8–23)
CO2: 22 mmol/L (ref 22–32)
Calcium: 9.2 mg/dL (ref 8.9–10.3)
Chloride: 104 mmol/L (ref 98–111)
Creatinine, Ser: 1.2 mg/dL — ABNORMAL HIGH (ref 0.44–1.00)
GFR, Estimated: 46 mL/min — ABNORMAL LOW (ref >60)
Glucose, Bld: 101 mg/dL — ABNORMAL HIGH (ref 70–99)
Potassium: 3.9 mmol/L (ref 3.5–5.1)
Sodium: 136 mmol/L (ref 135–145)

## 2022-06-30 LAB — POCT URINALYSIS DIP (CLINITEK)
Bilirubin, UA: NEGATIVE
Glucose, UA: NEGATIVE mg/dL
Ketones, POC UA: NEGATIVE mg/dL
Leukocytes, UA: NEGATIVE
Nitrite, UA: NEGATIVE
POC PROTEIN,UA: NEGATIVE
Spec Grav, UA: 1.015 (ref 1.010–1.025)
Urobilinogen, UA: 0.2 E.U./dL
pH, UA: 5.5 (ref 5.0–8.0)

## 2022-06-30 MED ORDER — ONDANSETRON HCL 4 MG/2ML IJ SOLN
4.0000 mg | Freq: Once | INTRAMUSCULAR | Status: AC
Start: 2022-06-30 — End: 2022-06-30
  Administered 2022-06-30: 4 mg via INTRAMUSCULAR

## 2022-06-30 MED ORDER — ONDANSETRON HCL 4 MG PO TABS: 4.0000 mg | ORAL_TABLET | Freq: Three times a day (TID) | ORAL | 0 refills | Status: DC | PRN

## 2022-06-30 MED ORDER — ACYCLOVIR 400 MG PO TABS: 400.0000 mg | ORAL_TABLET | Freq: Every day | ORAL | 0 refills | Status: DC

## 2022-06-30 NOTE — Patient Instructions (Addendum)
F/U in 4 weeks, call if you need me before, MMSE,   Please take out in wheelchair  Brother  [phone] Theresa Garcia will be contacted with results of labs and X Ray this afternoobn  Stat CBCand diff, BNP,Cmpand EGFr, at hospiyal lab  today  CXR stat today  Zofran 4 mg IM in office today and medication sent for twice daily as needed use  Acyclovir is prescribed To treat flare   Urine is sent for culture, no antibiotic prescribed , if no Bacterial infection she will be referred to Urology  for evaluation of blood in the urine  Thanks for choosing [facility], we consider it a privelige to serve you.

## 2022-07-01 ENCOUNTER — Encounter: Payer: Self-pay | Admitting: Family Medicine

## 2022-07-01 DIAGNOSIS — R110 Nausea: Secondary | ICD-10-CM | POA: Insufficient documentation

## 2022-07-01 DIAGNOSIS — R300 Dysuria: Secondary | ICD-10-CM | POA: Insufficient documentation

## 2022-07-01 DIAGNOSIS — R195 Other fecal abnormalities: Secondary | ICD-10-CM | POA: Insufficient documentation

## 2022-07-01 DIAGNOSIS — R052 Subacute cough: Secondary | ICD-10-CM | POA: Insufficient documentation

## 2022-07-01 DIAGNOSIS — R0602 Shortness of breath: Secondary | ICD-10-CM | POA: Insufficient documentation

## 2022-07-01 NOTE — Assessment & Plan Note (Signed)
1 week h/o cough, recent dx of LLL pneumonia, c/o worsening dyspnea, CXR and stat labs, pt to complete antibiotic course

## 2022-07-01 NOTE — Progress Notes (Signed)
   Theresa Garcia     MRN: [id number]      DOB: September 06, 1940  Chief Complaint  Patient presents with   Follow-up    ER follow up pneumonia still short of breath with walking, possible UTI diarrhea patient reports feeling sick all over    HPI [name] is here for follow up of recent ED visit at [facility] on 06/19/2022, dx  with pneumonia of LLL and chronic heart failure C/o generalized weakness, poor appetite, stinging and burning with urination and vaginal discomfort, has positive HSV2 and is under increased stress due to illness of her sibling who is institutionalized because of a CVA since past 5 months C/o nausea , with several episodes of vomiting in past 4 days  ROS Denies recent fever or chills. Denies sinus pressure, nasal congestion, ear pain or sore throat. Denies abdominal pain, nausea, vomiting,diarrhea or constipation.   C/o dysuria. Chronic limitation in mobility.  Increased  depression,and anxiety or insomnia. Denies skin break down or rash.   PE  BP 114/76 (BP Location: Right Arm, Patient Position: Sitting, Cuff Size: Large)   Pulse 86   Temp 97.7 F (36.5 C)   Ht 5\' 7"  (1.702 m)   Wt 194 lb (88 kg)   SpO2 95%   BMI 30.38 kg/m   Patient alert and in no cardiopulmonary distress.Flat affect and poor eye contact  HEENT: No facial asymmetry, EOMI,     Neck supple .  Chest: Clear to auscultation bilaterally.  CVS: S1, S2 systolic  murmur, no S3.Regular rate.  ABD: Soft non tender.   Ext: No edema  OZ:HYQMVHQIO ROM spine, shoulders, hips and knees.  Skin: Intact, no ulcerations or rash noted.   CNS: CN 2-12 intact, power,  normal throughout.no focal deficits noted.   Assessment & Plan  Encounter for examination following treatment at hospital Patient in for follow up of recent hospitalization. Discharge summary, and laboratory and radiology data are reviewed, and any questions or concerns  are discussed. Specific issues requiring follow up are  specifically addressed.   Subacute cough 1 week h/o cough, recent dx of LLL pneumonia, c/o worsening dyspnea, CXR and stat labs, pt to complete antibiotic course  Loose stools Triggered by stress, emotionally spiraling down due to sibling's serious illness, reports 2 BM/ day, encouraged to maintain adequate hydration  Type 2 HSV infection of vulvovaginal region Current flare due to stress, increase acyclovir dose x 10 days to treating dose  Depression with anxiety Not currnetly controlled, worrying over ill sister and depressed, not suicidal or homicidal, family supportive as they are all " going through it" togethter  CHF (congestive heart failure) (HCC) Recent ED dx of decompensated heart failure, reporting concerning symptoms though exam not consistent with report , CXR and BNP stat  Nausea Zofran 4 mg IM in office and limited supply of tabs for as needed use every 8 hrs prescribed  Dysuria CCUA positive for RBC only, if no infection will refer to Urology, has had microscopic hematuria in the past  Shortness of breath CXR and BNP also CBC stat

## 2022-07-01 NOTE — Assessment & Plan Note (Signed)
Triggered by stress, emotionally spiraling down due to sibling's serious illness, reports 2 BM/ day, encouraged to maintain adequate hydration

## 2022-07-01 NOTE — Assessment & Plan Note (Signed)
CCUA positive for RBC only, if no infection will refer to Urology, has had microscopic hematuria in the past

## 2022-07-01 NOTE — Assessment & Plan Note (Signed)
Zofran 4 mg IM in office and limited supply of tabs for as needed use every 8 hrs prescribed

## 2022-07-01 NOTE — Assessment & Plan Note (Signed)
Not currnetly controlled, worrying over ill sister and depressed, not suicidal or homicidal, family supportive as they are all " going through it" togethter

## 2022-07-01 NOTE — Assessment & Plan Note (Signed)
CXR and BNP also CBC stat

## 2022-07-01 NOTE — Assessment & Plan Note (Signed)
Recent ED dx of decompensated heart failure, reporting concerning symptoms though exam not consistent with report , CXR and BNP stat

## 2022-07-01 NOTE — Assessment & Plan Note (Signed)
Patient in for follow up of recent hospitalization. Discharge summary, and laboratory and radiology data are reviewed, and any questions or concerns  are discussed. Specific issues requiring follow up are specifically addressed.  

## 2022-07-01 NOTE — Assessment & Plan Note (Signed)
Current flare due to stress, increase acyclovir dose x 10 days to treating dose

## 2022-07-02 ENCOUNTER — Encounter: Payer: Self-pay | Admitting: Internal Medicine

## 2022-07-02 ENCOUNTER — Emergency Department (HOSPITAL_COMMUNITY): Payer: Medicare Other

## 2022-07-02 ENCOUNTER — Encounter (HOSPITAL_COMMUNITY): Payer: Self-pay | Admitting: Family Medicine

## 2022-07-02 ENCOUNTER — Ambulatory Visit: Payer: Medicare Other | Attending: Internal Medicine | Admitting: Internal Medicine

## 2022-07-02 ENCOUNTER — Other Ambulatory Visit: Payer: Self-pay

## 2022-07-02 ENCOUNTER — Inpatient Hospital Stay (HOSPITAL_COMMUNITY)
Admission: EM | Admit: 2022-07-02 | Discharge: 2022-07-07 | DRG: 291 | Disposition: A | Discharge status: Home / Self Care | Payer: Medicare Other | Attending: Family Medicine | Admitting: Family Medicine

## 2022-07-02 VITALS — BP 110/60 | HR 69 | Ht 67.0 in | Wt 194.2 lb

## 2022-07-02 DIAGNOSIS — E785 Hyperlipidemia, unspecified: Secondary | ICD-10-CM | POA: Diagnosis present

## 2022-07-02 DIAGNOSIS — Z8249 Family history of ischemic heart disease and other diseases of the circulatory system: Secondary | ICD-10-CM | POA: Diagnosis not present

## 2022-07-02 DIAGNOSIS — Z888 Allergy status to other drugs, medicaments and biological substances status: Secondary | ICD-10-CM

## 2022-07-02 DIAGNOSIS — I5030 Unspecified diastolic (congestive) heart failure: Secondary | ICD-10-CM | POA: Diagnosis present

## 2022-07-02 DIAGNOSIS — Z6830 Body mass index (BMI) 30.0-30.9, adult: Secondary | ICD-10-CM

## 2022-07-02 DIAGNOSIS — E8770 Fluid overload, unspecified: Secondary | ICD-10-CM | POA: Diagnosis present

## 2022-07-02 DIAGNOSIS — Z923 Personal history of irradiation: Secondary | ICD-10-CM

## 2022-07-02 DIAGNOSIS — R110 Nausea: Secondary | ICD-10-CM | POA: Diagnosis present

## 2022-07-02 DIAGNOSIS — I081 Rheumatic disorders of both mitral and tricuspid valves: Secondary | ICD-10-CM | POA: Diagnosis present

## 2022-07-02 DIAGNOSIS — I110 Hypertensive heart disease with heart failure: Principal | ICD-10-CM | POA: Diagnosis present

## 2022-07-02 DIAGNOSIS — R0602 Shortness of breath: Secondary | ICD-10-CM | POA: Diagnosis present

## 2022-07-02 DIAGNOSIS — G8929 Other chronic pain: Secondary | ICD-10-CM | POA: Diagnosis present

## 2022-07-02 DIAGNOSIS — B009 Herpesviral infection, unspecified: Secondary | ICD-10-CM | POA: Diagnosis present

## 2022-07-02 DIAGNOSIS — I509 Heart failure, unspecified: Principal | ICD-10-CM

## 2022-07-02 DIAGNOSIS — I517 Cardiomegaly: Secondary | ICD-10-CM | POA: Insufficient documentation

## 2022-07-02 DIAGNOSIS — I4819 Other persistent atrial fibrillation: Secondary | ICD-10-CM | POA: Diagnosis not present

## 2022-07-02 DIAGNOSIS — A6004 Herpesviral vulvovaginitis: Secondary | ICD-10-CM | POA: Diagnosis present

## 2022-07-02 DIAGNOSIS — I50811 Acute right heart failure: Secondary | ICD-10-CM | POA: Diagnosis present

## 2022-07-02 DIAGNOSIS — F418 Other specified anxiety disorders: Secondary | ICD-10-CM | POA: Diagnosis present

## 2022-07-02 DIAGNOSIS — I071 Rheumatic tricuspid insufficiency: Secondary | ICD-10-CM

## 2022-07-02 DIAGNOSIS — K219 Gastro-esophageal reflux disease without esophagitis: Secondary | ICD-10-CM | POA: Diagnosis not present

## 2022-07-02 DIAGNOSIS — Z880 Allergy status to penicillin: Secondary | ICD-10-CM

## 2022-07-02 DIAGNOSIS — E8779 Other fluid overload: Secondary | ICD-10-CM | POA: Diagnosis not present

## 2022-07-02 DIAGNOSIS — Z9851 Tubal ligation status: Secondary | ICD-10-CM

## 2022-07-02 DIAGNOSIS — M810 Age-related osteoporosis without current pathological fracture: Secondary | ICD-10-CM | POA: Diagnosis present

## 2022-07-02 DIAGNOSIS — I361 Nonrheumatic tricuspid (valve) insufficiency: Secondary | ICD-10-CM | POA: Diagnosis not present

## 2022-07-02 DIAGNOSIS — G4700 Insomnia, unspecified: Secondary | ICD-10-CM | POA: Diagnosis present

## 2022-07-02 DIAGNOSIS — Z7901 Long term (current) use of anticoagulants: Secondary | ICD-10-CM

## 2022-07-02 DIAGNOSIS — Z86711 Personal history of pulmonary embolism: Secondary | ICD-10-CM | POA: Diagnosis present

## 2022-07-02 DIAGNOSIS — E669 Obesity, unspecified: Secondary | ICD-10-CM | POA: Diagnosis present

## 2022-07-02 DIAGNOSIS — Z79899 Other long term (current) drug therapy: Secondary | ICD-10-CM

## 2022-07-02 DIAGNOSIS — Z87891 Personal history of nicotine dependence: Secondary | ICD-10-CM | POA: Diagnosis not present

## 2022-07-02 DIAGNOSIS — E782 Mixed hyperlipidemia: Secondary | ICD-10-CM

## 2022-07-02 DIAGNOSIS — Z90710 Acquired absence of both cervix and uterus: Secondary | ICD-10-CM

## 2022-07-02 DIAGNOSIS — I351 Nonrheumatic aortic (valve) insufficiency: Secondary | ICD-10-CM | POA: Diagnosis not present

## 2022-07-02 DIAGNOSIS — Z7983 Long term (current) use of bisphosphonates: Secondary | ICD-10-CM

## 2022-07-02 DIAGNOSIS — I5033 Acute on chronic diastolic (congestive) heart failure: Secondary | ICD-10-CM | POA: Diagnosis not present

## 2022-07-02 DIAGNOSIS — Z853 Personal history of malignant neoplasm of breast: Secondary | ICD-10-CM

## 2022-07-02 DIAGNOSIS — E66811 Obesity, class 1: Secondary | ICD-10-CM | POA: Diagnosis present

## 2022-07-02 DIAGNOSIS — Z87442 Personal history of urinary calculi: Secondary | ICD-10-CM

## 2022-07-02 DIAGNOSIS — R2681 Unsteadiness on feet: Secondary | ICD-10-CM | POA: Diagnosis present

## 2022-07-02 DIAGNOSIS — Z9221 Personal history of antineoplastic chemotherapy: Secondary | ICD-10-CM | POA: Diagnosis not present

## 2022-07-02 DIAGNOSIS — F419 Anxiety disorder, unspecified: Secondary | ICD-10-CM | POA: Diagnosis present

## 2022-07-02 DIAGNOSIS — I4891 Unspecified atrial fibrillation: Secondary | ICD-10-CM | POA: Diagnosis present

## 2022-07-02 DIAGNOSIS — I10 Essential (primary) hypertension: Secondary | ICD-10-CM | POA: Diagnosis present

## 2022-07-02 DIAGNOSIS — F32A Depression, unspecified: Secondary | ICD-10-CM | POA: Diagnosis present

## 2022-07-02 DIAGNOSIS — I5031 Acute diastolic (congestive) heart failure: Secondary | ICD-10-CM | POA: Diagnosis not present

## 2022-07-02 HISTORY — DX: Acute right heart failure: I50.811

## 2022-07-02 LAB — URINE CULTURE

## 2022-07-02 LAB — CBC
HCT: 38.5 % (ref 36.0–46.0)
Hemoglobin: 12.2 g/dL (ref 12.0–15.0)
MCH: 30.1 pg (ref 26.0–34.0)
MCHC: 31.7 g/dL (ref 30.0–36.0)
MCV: 95.1 fL (ref 80.0–100.0)
Platelets: 230 10*3/uL (ref 150–400)
RBC: 4.05 MIL/uL (ref 3.87–5.11)
RDW: 16.5 % — ABNORMAL HIGH (ref 11.5–15.5)
WBC: 7.3 10*3/uL (ref 4.0–10.5)
nRBC: 0 % (ref 0.0–0.2)

## 2022-07-02 LAB — BASIC METABOLIC PANEL
Anion gap: 10 (ref 5–15)
BUN: 22 mg/dL (ref 8–23)
CO2: 22 mmol/L (ref 22–32)
Calcium: 9 mg/dL (ref 8.9–10.3)
Chloride: 105 mmol/L (ref 98–111)
Creatinine, Ser: 0.97 mg/dL (ref 0.44–1.00)
GFR, Estimated: 59 mL/min — ABNORMAL LOW (ref >60)
Glucose, Bld: 97 mg/dL (ref 70–99)
Potassium: 3.6 mmol/L (ref 3.5–5.1)
Sodium: 137 mmol/L (ref 135–145)

## 2022-07-02 LAB — BRAIN NATRIURETIC PEPTIDE: B Natriuretic Peptide: 405 pg/mL — ABNORMAL HIGH (ref 0.0–100.0)

## 2022-07-02 MED ORDER — SODIUM CHLORIDE 0.9% FLUSH: 3.0000 mL | INTRAVENOUS | Status: DC | PRN

## 2022-07-02 MED ORDER — POTASSIUM CHLORIDE CRYS ER 20 MEQ PO TBCR
20.0000 meq | EXTENDED_RELEASE_TABLET | Freq: Two times a day (BID) | ORAL | Status: DC
  Administered 2022-07-02 (×2): 20 meq via ORAL
  Filled 2022-07-02 (×2): qty 1

## 2022-07-02 MED ORDER — FLUOXETINE HCL 10 MG PO CAPS
10.0000 mg | ORAL_CAPSULE | Freq: Every day | ORAL | Status: DC
  Administered 2022-07-02 – 2022-07-07 (×6): 10 mg via ORAL
  Filled 2022-07-02 (×6): qty 1

## 2022-07-02 MED ORDER — OYSTER SHELL CALCIUM/D3 500-5 MG-MCG PO TABS
1.0000 | ORAL_TABLET | Freq: Every day | ORAL | Status: DC
  Administered 2022-07-03 – 2022-07-07 (×5): 1 via ORAL
  Filled 2022-07-02 (×5): qty 1

## 2022-07-02 MED ORDER — TEMAZEPAM 15 MG PO CAPS
30.0000 mg | ORAL_CAPSULE | Freq: Every day | ORAL | Status: DC
  Administered 2022-07-02 – 2022-07-06 (×5): 30 mg via ORAL
  Filled 2022-07-02 (×5): qty 2

## 2022-07-02 MED ORDER — ALPRAZOLAM 0.5 MG PO TABS: 0.2500 mg | ORAL_TABLET | Freq: Two times a day (BID) | ORAL | Status: DC | PRN

## 2022-07-02 MED ORDER — SODIUM CHLORIDE 0.9 % IV SOLN: 250.0000 mL | INTRAVENOUS | Status: DC | PRN

## 2022-07-02 MED ORDER — MECLIZINE HCL 12.5 MG PO TABS: 25.0000 mg | ORAL_TABLET | Freq: Three times a day (TID) | ORAL | Status: DC | PRN

## 2022-07-02 MED ORDER — ONDANSETRON HCL 4 MG/2ML IJ SOLN
4.0000 mg | Freq: Four times a day (QID) | INTRAMUSCULAR | Status: DC | PRN
  Administered 2022-07-06: 4 mg via INTRAVENOUS
  Filled 2022-07-02: qty 2

## 2022-07-02 MED ORDER — SODIUM CHLORIDE 0.9% FLUSH
3.0000 mL | Freq: Two times a day (BID) | INTRAVENOUS | Status: DC
  Administered 2022-07-02 – 2022-07-07 (×9): 3 mL via INTRAVENOUS

## 2022-07-02 MED ORDER — FUROSEMIDE 10 MG/ML IJ SOLN
40.0000 mg | INTRAMUSCULAR | Status: AC
  Administered 2022-07-02: 40 mg via INTRAVENOUS
  Filled 2022-07-02: qty 4

## 2022-07-02 MED ORDER — BENAZEPRIL HCL 20 MG PO TABS
40.0000 mg | ORAL_TABLET | Freq: Every day | ORAL | Status: DC
  Administered 2022-07-02 – 2022-07-07 (×5): 40 mg via ORAL
  Filled 2022-07-02 (×6): qty 2

## 2022-07-02 MED ORDER — ACYCLOVIR 800 MG PO TABS
400.0000 mg | ORAL_TABLET | Freq: Every day | ORAL | Status: DC
  Administered 2022-07-02 – 2022-07-03 (×4): 400 mg via ORAL
  Filled 2022-07-02 (×4): qty 1

## 2022-07-02 MED ORDER — AMLODIPINE BESYLATE 5 MG PO TABS
5.0000 mg | ORAL_TABLET | Freq: Every day | ORAL | Status: DC
  Administered 2022-07-02 – 2022-07-07 (×5): 5 mg via ORAL
  Filled 2022-07-02 (×6): qty 1

## 2022-07-02 MED ORDER — ATORVASTATIN CALCIUM 10 MG PO TABS
20.0000 mg | ORAL_TABLET | Freq: Every evening | ORAL | Status: DC
  Administered 2022-07-02 – 2022-07-06 (×5): 20 mg via ORAL
  Filled 2022-07-02 (×5): qty 2

## 2022-07-02 MED ORDER — ZOLPIDEM TARTRATE 5 MG PO TABS: 5.0000 mg | ORAL_TABLET | Freq: Every evening | ORAL | Status: DC | PRN

## 2022-07-02 MED ORDER — APIXABAN 5 MG PO TABS
5.0000 mg | ORAL_TABLET | Freq: Two times a day (BID) | ORAL | Status: DC
  Administered 2022-07-02 – 2022-07-07 (×10): 5 mg via ORAL
  Filled 2022-07-02 (×10): qty 1

## 2022-07-02 MED ORDER — FUROSEMIDE 10 MG/ML IJ SOLN
60.0000 mg | Freq: Two times a day (BID) | INTRAMUSCULAR | Status: DC
  Administered 2022-07-02 – 2022-07-03 (×2): 60 mg via INTRAVENOUS
  Filled 2022-07-02 (×2): qty 6

## 2022-07-02 MED ORDER — ACETAMINOPHEN 325 MG PO TABS
650.0000 mg | ORAL_TABLET | ORAL | Status: DC | PRN
  Administered 2022-07-06: 650 mg via ORAL
  Filled 2022-07-02: qty 2

## 2022-07-02 MED ORDER — METOPROLOL TARTRATE 25 MG PO TABS
12.5000 mg | ORAL_TABLET | Freq: Two times a day (BID) | ORAL | Status: DC
  Administered 2022-07-02 – 2022-07-06 (×6): 12.5 mg via ORAL
  Filled 2022-07-02 (×9): qty 1

## 2022-07-02 NOTE — ED Provider Notes (Signed)
[place] EMERGENCY DEPARTMENT AT [facility] Provider Note   CSN: [id number] Arrival date & time: 07/02/22  1056     History  Chief Complaint  Patient presents with   Leg Swelling    Theresa Garcia is a 82 y.o. female.  HPI   This patient is a 82 year old female history of congestive heart failure, hypertension, on amlodipine and metoprolol, she is also on torsemide for diuresis but reports a couple of weeks of worsening shortness of breath and dyspnea on exertion.  She had seen the cardiologist in the office today who wanted her to be admitted to the hospital for diuresis.  She has known bilateral pleural effusions, recently treated for pneumonia, saw her family doctor couple of days ago thought to have a possible UTI because of hematuria.  Culture results pending.  Patient denies chest pain or fever.  Home Medications Prior to Admission medications   Medication Sig Start Date End Date Taking? Authorizing Provider  acetaminophen (TYLENOL) 500 MG tablet Take 1,000 mg by mouth every 6 (six) hours as needed for mild pain or moderate pain.     [provider]  acyclovir (ZOVIRAX) 400 MG tablet TAKE 1 TABLET BY MOUTH TWICE  DAILY Patient not taking: Reported on 07/02/2022 01/19/22   Kerri Perches, MD  acyclovir (ZOVIRAX) 400 MG tablet Take 1 tablet (400 mg total) by mouth 5 (five) times daily. 06/30/22   Kerri Perches, MD  alendronate (FOSAMAX) 70 MG tablet TAKE 1 TABLET BY MOUTH EVERY 7  DAYS WITH A FULL GLASS OF WATER  ON AN EMPTY STOMACH 10/17/21   Kerri Perches, MD  amLODipine-benazepril (LOTREL) 5-40 MG capsule Take 1 capsule by mouth daily.    [provider]  atorvastatin (LIPITOR) 20 MG tablet TAKE 1 TABLET BY MOUTH IN THE  EVENING 04/02/22   Kerri Perches, MD  Calcium Carb-Cholecalciferol (CALTRATE 600+D3) 600-20 MG-MCG TABS Take 1 tablet by mouth daily. Take one tablet by mouth one time daily 01/07/22   Kerri Perches, MD   dapagliflozin propanediol (FARXIGA) 10 MG TABS tablet Take 1 tablet (10 mg total) by mouth daily before breakfast. Patient not taking: Reported on 07/02/2022 02/25/22   Sharlene Dory, NP  Elastic Bandages & Supports MISC 1 Package by Does not apply route as directed. 02/25/22   Sharlene Dory, NP  ELIQUIS 5 MG TABS tablet TAKE 1 TABLET BY MOUTH TWICE  DAILY 10/03/21   Kerri Perches, MD  FLUoxetine (PROZAC) 10 MG capsule TAKE 1 CAPSULE BY MOUTH DAILY 10/03/21   Kerri Perches, MD  furosemide (LASIX) 20 MG tablet Take by mouth. Patient not taking: Reported on 07/02/2022 06/19/22   [provider]  meclizine (ANTIVERT) 25 MG tablet TAKE 1 TABLET BY MOUTH THREE TIMES DAILY AS NEEDED FOR DIZZINESS 05/19/21   Kerri Perches, MD  metoprolol tartrate (LOPRESSOR) 25 MG tablet Take 12.5 mg by mouth 2 (two) times daily.    [provider]  Misc. Devices (TOILET SAFETY FRAME) MISC 1 each by Does not apply route daily as needed. 04/13/17   Eustace Moore, MD  ondansetron (ZOFRAN) 4 MG tablet Take 1 tablet (4 mg total) by mouth every 8 (eight) hours as needed for nausea or vomiting. 06/30/22   Kerri Perches, MD  potassium chloride SA (KLOR-CON M) 20 MEQ tablet Take 1.5 tablets (30 mEq total) by mouth daily. 03/30/22   Sharlene Dory, NP  temazepam (RESTORIL) 30 MG capsule TAKE 1  CAPSULE BY MOUTH EVERY  NIGHT AT BEDTIME 03/05/22   Kerri Perches, MD  Torsemide 40 MG TABS Take 60 mg by mouth daily. 03/30/22 07/02/22  Sharlene Dory, NP  vitamin C (ASCORBIC ACID) 500 MG tablet Take 500 mg by mouth daily.    [provider]  Vitamin D, Ergocalciferol, (DRISDOL) 1.25 MG (50000 UNIT) CAPS capsule TAKE 1 CAPSULE BY MOUTH ONCE  WEEKLY 06/04/22   Kerri Perches, MD      Allergies    Meclizine and Penicillins    Review of Systems   Review of Systems  All other systems reviewed and are negative.   Physical Exam Updated Vital Signs BP 107/67   Pulse 64   Resp 15    Ht 1.702 m (5\' 7" )   Wt 88 kg   SpO2 94%   BMI 30.38 kg/m  Physical Exam Vitals and nursing note reviewed.  Constitutional:      General: She is not in acute distress.    Appearance: She is well-developed.  HENT:     Head: Normocephalic and atraumatic.     Mouth/Throat:     Pharynx: No oropharyngeal exudate.  Eyes:     General: No scleral icterus.       Right eye: No discharge.        Left eye: No discharge.     Conjunctiva/sclera: Conjunctivae normal.     Pupils: Pupils are equal, round, and reactive to light.  Neck:     Thyroid: No thyromegaly.     Vascular: No JVD.  Cardiovascular:     Rate and Rhythm: Normal rate and regular rhythm.     Heart sounds: Murmur heard.     No friction rub. No gallop.  Pulmonary:     Effort: Pulmonary effort is normal. No respiratory distress.     Breath sounds: Rales present. No wheezing.  Abdominal:     General: Bowel sounds are normal. There is no distension.     Palpations: Abdomen is soft. There is no mass.     Tenderness: There is no abdominal tenderness.  Musculoskeletal:        General: No tenderness. Normal range of motion.     Cervical back: Normal range of motion and neck supple.     Right lower leg: Edema present.     Left lower leg: Edema present.  Lymphadenopathy:     Cervical: No cervical adenopathy.  Skin:    General: Skin is warm and dry.     Findings: No erythema or rash.  Neurological:     Mental Status: She is alert.     Coordination: Coordination normal.  Psychiatric:        Behavior: Behavior normal.     ED Results / Procedures / Treatments   Labs (all labs ordered are listed, but only abnormal results are displayed) Labs Reviewed  BASIC METABOLIC PANEL - Abnormal; Notable for the following components:      Result Value   GFR, Estimated 59 (*)    All other components within normal limits  CBC - Abnormal; Notable for the following components:   RDW 16.5 (*)    All other components within normal limits   BRAIN NATRIURETIC PEPTIDE - Abnormal; Notable for the following components:   B Natriuretic Peptide 405.0 (*)    All other components within normal limits  URINALYSIS, ROUTINE W REFLEX MICROSCOPIC    EKG EKG Interpretation  Date/Time:  Thursday Jul 02 2022 11:23:03 EDT Ventricular Rate:  64 PR Interval:    QRS Duration: 99 QT Interval:  408 QTC Calculation: 421 R Axis:   80 Text Interpretation: Atrial fibrillation Low voltage, precordial leads Probable anteroseptal infarct, old Confirmed by Eber Hong ([id number]) on 07/02/2022 12:30:00 PM  Radiology DG Chest Port 1 View  Result Date: 07/02/2022 CLINICAL DATA:  Dyspnea EXAM: PORTABLE CHEST 1 VIEW COMPARISON:  Radiograph 2 days prior. FINDINGS: The heart is enlarged, unchanged. The upper mediastinal contours are stable. There are probable trace left larger than right pleural effusions with adjacent left basilar airspace opacity likely reflecting atelectasis, unchanged. There is no new or worsening focal airspace disease. There is no overt pulmonary edema. There is no pneumothorax There is no acute osseous abnormality. IMPRESSION: Probable trace left larger than right pleural effusions with left basilar atelectasis, unchanged. No new or worsening focal airspace disease. Electronically Signed   By: Lesia Hausen M.D.   On: 07/02/2022 11:47   DG Chest 2 View  Result Date: 06/30/2022 CLINICAL DATA:  Subacute cough. Shortness of breath. Chronic congestive heart failure. EXAM: CHEST - 2 VIEW COMPARISON:  Radiograph 06/19/2022. Chest CT 01/27/2021 FINDINGS: Stable cardiomegaly. Unchanged mediastinal contours. There are small bilateral pleural effusions. No pulmonary edema. No focal airspace disease. No pneumothorax. Left axillary surgical clips. On limited assessment, no acute osseous findings. IMPRESSION: 1. Small bilateral pleural effusions. No pulmonary edema. 2. Stable cardiomegaly. 3. No focal airspace disease or evidence of pneumonia.  Electronically Signed   By: Narda Rutherford M.D.   On: 06/30/2022 16:06    Procedures Procedures    Medications Ordered in ED Medications  furosemide (LASIX) injection 40 mg (40 mg Intravenous Given 07/02/22 1155)    ED Course/ Medical Decision Making/ A&P                             Medical Decision Making Amount and/or Complexity of Data Reviewed Labs: ordered. Radiology: ordered.  Risk Prescription drug management. Decision regarding hospitalization.     This patient presents to the ED for concern of shortness of breath and edema, this involves an extensive number of treatment options, and is a complaint that carries with it a high risk of complications and morbidity.  The differential diagnosis includes CHF exacerbation, pneumonia, less likely to be pulmonary embolism or pneumothorax   Co morbidities that complicate the patient evaluation  Known congestive heart failure   Additional history obtained:  Additional history obtained from electronic medical record including notes from cardiology visit today and prior echocardiogram from January 2024, which showed an ejection fraction of 60 to 65% with mild concentric left ventricular hypertrophy, indeterminate diastolic parameters External records from outside source obtained and reviewed including office visit with recommendations from cardiology and discussion with the cardiologist myself.  She recommends that the patient be admitted to the hospital for diuresis   Lab Tests:  I Ordered, and personally interpreted labs.  The pertinent results include: Evaded BNP consistent with congestive heart failure   Imaging Studies ordered:  I ordered imaging studies including chest x-ray I independently visualized and interpreted imaging which showed effusions I agree with the radiologist interpretation   Cardiac Monitoring: / EKG:  The patient was maintained on a cardiac monitor.  I personally viewed and interpreted the  cardiac monitored which showed an underlying rhythm of: Atrial fibrillation   Consultations Obtained:  I requested consultation with the cardiology and hospitalist,  and discussed lab and imaging findings as well as  pertinent plan - they recommend: Admission for diuresis   Problem List / ED Course / Critical interventions / Medication management  CHF, no hypoxia, not in severe distress, not severely hypertensive I ordered medication including furosemide for edema Reevaluation of the patient after these medicines showed that the patient stable I have reviewed the patients home medicines and have made adjustments as needed   Social Determinants of Health:  Congestive heart failure, elderly   Test / Admission - Considered:  Will admit to hospital Discussed case with Dr. Laural Benes, appreciate his willingness to admit this patient         Final Clinical Impression(s) / ED Diagnoses Final diagnoses:  Congestive heart failure, unspecified HF chronicity, unspecified heart failure type [facility])    Rx / DC Orders ED Discharge Orders     None         Eber Hong, MD 07/02/22 1242

## 2022-07-02 NOTE — Patient Instructions (Addendum)
Medication Instructions:  Your physician recommends that you continue on your current medications as directed. Please refer to the Current Medication list given to you today.  Labwork: none  Testing/Procedures: none  Follow-Up: Your physician recommends that you schedule a follow-up appointment in: 1 month with Philis Nettle Your physician recommends that you schedule a follow-up appointment in: 3 months with Mallipeddi  Any Other Special Instructions Will Be Listed Below (If Applicable). Report to ED at [facility] now You have been referred to [facility]  If you need a refill on your cardiac medications before your next appointment, please call your pharmacy.

## 2022-07-02 NOTE — Hospital Course (Addendum)
82 year old female retired from [employer] in [place] after 32 years with atrial fibrillation, severe atrial functional TR with moderate RV enlargement, hypertension, osteoporosis, hyperlipidemia, HFpEF, history of breast cancer treated with chemo and radiation, GERD, chronic back pain, nephrolithiasis who was sent to the emergency department from cardiology office visit earlier today.  She complained of DOE, two-pillow orthopnea and PND associated with abdominal distention and increasing lower extremity edema progressive over the past 2 weeks.  She was notably volume overloaded and also complained of ongoing nausea and intermittent dizziness.  Dr. Jenene Slicker was concerned about decompensated right heart failure and referred patient to ED.  She had recently been treated for UTI by her PCP.  Patient's urine culture tested negative.  Patient was given IV furosemide in the emergency department and already starting to have a diuresis.  Her cardiac BNP was elevated at 405.0.  She had trace bilateral pleural effusions and atelectasis on chest x-ray.  Admission was requested for further management with IV diuresis with furosemide.

## 2022-07-02 NOTE — Progress Notes (Signed)
Cardiology Office Note  Date: 07/02/2022   ID: Theresa, Garcia 1940/04/02, MRN [id number]  PCP:  Kerri Perches, MD  Cardiologist:  Marjo Bicker, MD Electrophysiologist:  None   Reason for Office Visit: Follow-up of right heart failure   History of Present Illness: Theresa Garcia is a 82 y.o. female known to have HTN, HLD, A-fib, severe atrial functional TR with moderate RV enlargement and mild PASP is here for follow-up visit.  Patient was initially referred to cardiology clinic in 2016 for exertional chest pain but CT angio coronary arteries in [place], [place] on 03/07/2007 demonstrated normal coronaries. She has bilateral varicose veins. She had scarlet fever as a child. She is here for follow-up visit. She has been having symptoms of DOE, two-pillow orthopnea and PND associated with abdominal distention and lower EXTR swelling x 2 weeks.  She also has burning micturition with hematuria for which she saw her PCP a few days ago and was prescribed Zofran for nausea.  Urine cultures were sent, pending results.  Denied angina, syncope.  Patient was feeling extremely nauseous throughout my interview.  Past Medical History:  Diagnosis Date   (HFpEF) heart failure with preserved ejection fraction (HCC)    A-fib (HCC)    Dx: 01/2022   Breast cancer (HCC) 02/24/2000   Bronchitis    Cancer (HCC)    Chronic back pain    Depression    GERD (gastroesophageal reflux disease)    Hematuria    Hyperlipidemia    Hypertension    Insomnia    Kidney stone    Personal history of chemotherapy    Personal history of radiation therapy    Pleural effusion    Pneumonia    Thyroid goiter    Vertigo     Past Surgical History:  Procedure Laterality Date   ABDOMINAL HYSTERECTOMY     BREAST LUMPECTOMY Left 2002   BREAST SURGERY     left   CYSTOSCOPY/RETROGRADE/URETEROSCOPY/STONE EXTRACTION WITH BASKET  01/07/2011   Procedure: CYSTOSCOPY/RETROGRADE/URETEROSCOPY/STONE  EXTRACTION WITH BASKET;  Surgeon: Ky Barban;  Location: [facility];  Service: Urology;  Laterality: Left;  Balloon Dilatation; stone given to family per MD   TUBAL LIGATION      Current Outpatient Medications  Medication Sig Dispense Refill   acetaminophen (TYLENOL) 500 MG tablet Take 1,000 mg by mouth every 6 (six) hours as needed for mild pain or moderate pain.      acyclovir (ZOVIRAX) 400 MG tablet Take 1 tablet (400 mg total) by mouth 5 (five) times daily. 35 tablet 0   alendronate (FOSAMAX) 70 MG tablet TAKE 1 TABLET BY MOUTH EVERY 7  DAYS WITH A FULL GLASS OF WATER  ON AN EMPTY STOMACH 12 tablet 3   amLODipine-benazepril (LOTREL) 5-40 MG capsule Take 1 capsule by mouth daily.     atorvastatin (LIPITOR) 20 MG tablet TAKE 1 TABLET BY MOUTH IN THE  EVENING 90 tablet 3   Calcium Carb-Cholecalciferol (CALTRATE 600+D3) 600-20 MG-MCG TABS Take 1 tablet by mouth daily. Take one tablet by mouth one time daily 60 tablet 11   Elastic Bandages & Supports MISC 1 Package by Does not apply route as directed. 1 Package 0   ELIQUIS 5 MG TABS tablet TAKE 1 TABLET BY MOUTH TWICE  DAILY 180 tablet 3   FLUoxetine (PROZAC) 10 MG capsule TAKE 1 CAPSULE BY MOUTH DAILY 90 capsule 3   meclizine (ANTIVERT) 25 MG tablet TAKE 1 TABLET BY MOUTH THREE TIMES  DAILY AS NEEDED FOR DIZZINESS 30 tablet 0   metoprolol tartrate (LOPRESSOR) 25 MG tablet Take 12.5 mg by mouth 2 (two) times daily.     Misc. Devices (TOILET SAFETY FRAME) MISC 1 each by Does not apply route daily as needed. 1 each 0   ondansetron (ZOFRAN) 4 MG tablet Take 1 tablet (4 mg total) by mouth every 8 (eight) hours as needed for nausea or vomiting. 20 tablet 0   potassium chloride SA (KLOR-CON M) 20 MEQ tablet Take 1.5 tablets (30 mEq total) by mouth daily. 135 tablet 3   temazepam (RESTORIL) 30 MG capsule TAKE 1 CAPSULE BY MOUTH EVERY  NIGHT AT BEDTIME 90 capsule 3   Torsemide 40 MG TABS Take 60 mg by mouth daily. 90 tablet 3   vitamin C (ASCORBIC  ACID) 500 MG tablet Take 500 mg by mouth daily.     Vitamin D, Ergocalciferol, (DRISDOL) 1.25 MG (50000 UNIT) CAPS capsule TAKE 1 CAPSULE BY MOUTH ONCE  WEEKLY 13 capsule 3   acyclovir (ZOVIRAX) 400 MG tablet TAKE 1 TABLET BY MOUTH TWICE  DAILY (Patient not taking: Reported on 07/02/2022) 180 tablet 0   dapagliflozin propanediol (FARXIGA) 10 MG TABS tablet Take 1 tablet (10 mg total) by mouth daily before breakfast. (Patient not taking: Reported on 07/02/2022) 30 tablet 11   furosemide (LASIX) 20 MG tablet Take by mouth. (Patient not taking: Reported on 07/02/2022)     No current facility-administered medications for this visit.   Allergies:  Meclizine and Penicillins   Social History: The patient  reports that she quit smoking about 41 years ago. Her smoking use included cigarettes. She has a 5.00 pack-year smoking history. She has never used smokeless tobacco. She reports that she does not drink alcohol and does not use drugs.   Family History: The patient's family history includes Aneurysm in her brother; Bone cancer in her brother; COPD in her father, sister, and sister; Diabetes in her brother and sister; HIV/AIDS in her brother; Heart disease in her brother; Seizures in her mother.   ROS:  Please see the history of present illness. Otherwise, complete review of systems is positive for none  All other systems are reviewed and negative.   Physical Exam: VS:  BP 110/60   Pulse 69   Ht 5\' 7"  (1.702 m)   Wt 194 lb 3.2 oz (88.1 kg)   SpO2 99%   BMI 30.42 kg/m , BMI Body mass index is 30.42 kg/m.  Wt Readings from Last 3 Encounters:  07/02/22 194 lb 3.2 oz (88.1 kg)  06/30/22 194 lb (88 kg)  04/02/22 194 lb (88 kg)    General: Patient appears comfortable at rest. HEENT: Conjunctiva and lids normal, oropharynx clear with moist mucosa. Neck: Supple, no elevated JVP or carotid bruits, no thyromegaly. Lungs: Clear to auscultation, nonlabored breathing at rest. Cardiac: Regular rate and  rhythm, no S3 or significant systolic murmur, no pericardial rub. Abdomen: Soft, nontender, no hepatomegaly, bowel sounds present, no guarding or rebound. Extremities: No pitting edema, distal pulses 2+. Skin: Warm and dry. Musculoskeletal: No kyphosis. Neuropsychiatric: Alert and oriented x3, affect grossly appropriate.  Recent Labwork: 01/27/2022: ALT 30; AST 37; TSH 0.557 01/29/2022: Magnesium 1.7 01/30/2022: B Natriuretic Peptide 403.0 06/30/2022: BUN 31; Creatinine, Ser 1.20; Hemoglobin 12.6; Platelets 224; Potassium 3.9; Sodium 136     Component Value Date/Time   CHOL 149 07/04/2021 1341   TRIG 96 07/04/2021 1341   HDL 53 07/04/2021 1341   CHOLHDL 2.8 07/04/2021  1341   CHOLHDL 2.5 04/25/2019 1053   VLDL 21 09/03/2016 1343   LDLCALC 78 07/04/2021 1341   LDLCALC 79 04/25/2019 1053    Assessment and Plan: Patient is a 82 y/o F known to have HTN, HLD, A-fib, severe atrial functional TR with moderate RV enlargement and mild PASP is here for follow-up visit.  # Acute on chronic HFpEF # Right heart failure with severe atrial functional TR and moderate RV enlargement with mild PASP -She has been having symptoms of DOE, two-pillow orthopnea and PND associated with abdominal distention and lower EXTR swelling x 2 weeks. She will need to be admitted to the hospital for management of acute on chronic HFpEF exacerbation with IV diuresis.  Can start with IV Lasix 60 mg BID. I spoke with Dr. Hyacinth Meeker, ER attending at [facility] who is aware of the patient's arrival. Cardiology team will follow the patient tomorrow. Hold home dose of torsemide. -Outpatient referral to structural heart clinic for therapeutic interventions of severe atrial functional TR due to multiple prior hospital admissions for heart failure exacerbation.  # Persistent A-fib -Patient has recent onset of dizziness and has low ventricular response. She will benefit from 2-week event monitor to rule out any conduction system  abnormalities. -Continue metoprolol tartrate 12.5 mg twice daily and Eliquis 5 mg twice daily.6  # UTI -Patient symptoms of UTI, burning micturition with tingling and hematuria. She also has nausea for which she saw her PCP recently and was prescribed Zofran. She has been nauseous throughout my interview. Urine culture results were pending.   I have spent a total of 50 minutes with patient reviewing chart, EKGs, labs and examining patient as well as establishing an assessment and plan that was discussed with the patient.  > 50% of time was spent in direct patient care.    Medication Adjustments/Labs and Tests Ordered: Current medicines are reviewed at length with the patient today.  Concerns regarding medicines are outlined above.   Tests Ordered: No orders of the defined types were placed in this encounter.   Medication Changes: No orders of the defined types were placed in this encounter.   Disposition:  Follow up  1 month with Sharlene Dory and 3 months with me  Signed Tahji Waumandee Verne Spurr, MD, 07/02/2022 9:57 AM    [facility] at [facility] [address] [place], [place], [place] [address]

## 2022-07-02 NOTE — ED Triage Notes (Signed)
Pt arrived POV with her brother. Pt went to cardiologist and was sent here to remove fluid per pt brother. Pt c/o bilateral , bilateral plural effusion, hematuria and dyspnea . Pt waiting on culture results.

## 2022-07-02 NOTE — Addendum Note (Signed)
Addended by: Kerri Perches on: 07/02/2022 08:01 AM   Modules accepted: Orders

## 2022-07-02 NOTE — H&P (Signed)
History and Physical  [facility]  Theresa Garcia ZOX:[id number] DOB: 1940-12-14 DOA: 07/02/2022  PCP: Kerri Perches, MD  Patient coming from: referred from [facility] [place] [facility]  Level of care: Telemetry  I have personally briefly reviewed patient's old medical records in [facility]  Chief Complaint: DOE   HPI: Theresa Garcia is a 82 year old female retired from [employer] plant in [place] after 32 years with atrial fibrillation, severe atrial functional TR with moderate RV enlargement, hypertension, osteoporosis, hyperlipidemia, HFpEF, history of breast cancer treated with chemo and radiation, GERD, chronic back pain, nephrolithiasis who was sent to the emergency department from cardiology office visit earlier today.  She complained of DOE, two-pillow orthopnea and PND associated with abdominal distention and increasing lower extremity edema progressive over the past 2 weeks.  She was notably volume overloaded and also complained of ongoing nausea and intermittent dizziness.  Dr. Jenene Slicker was concerned about decompensated right heart failure and referred patient to ED.  She had recently been treated for UTI by her PCP.  Patient's urine culture tested negative.  Patient was given IV furosemide in the emergency department and already starting to have a diuresis.  Her cardiac BNP was elevated at 405.0.  She had trace bilateral pleural effusions and atelectasis on chest x-ray.  Admission was requested for further management with IV diuresis with furosemide.    Past Medical History:  Diagnosis Date   (HFpEF) heart failure with preserved ejection fraction (HCC)    A-fib (HCC)    Dx: 01/2022   Breast cancer (HCC) 02/24/2000   Bronchitis    Cancer (HCC)    Chronic back pain    Depression    GERD (gastroesophageal reflux disease)    Hematuria    Hyperlipidemia    Hypertension    Insomnia    Kidney stone    Personal history of chemotherapy    Personal history of radiation therapy     Pleural effusion    Pneumonia    Thyroid goiter    Vertigo     Past Surgical History:  Procedure Laterality Date   ABDOMINAL HYSTERECTOMY     BREAST LUMPECTOMY Left 2002   BREAST SURGERY     left   CYSTOSCOPY/RETROGRADE/URETEROSCOPY/STONE EXTRACTION WITH BASKET  01/07/2011   Procedure: CYSTOSCOPY/RETROGRADE/URETEROSCOPY/STONE EXTRACTION WITH BASKET;  Surgeon: Ky Barban;  Location: [facility];  Service: Urology;  Laterality: Left;  Balloon Dilatation; stone given to family per MD   TUBAL LIGATION       reports that she quit smoking about 41 years ago. Her smoking use included cigarettes. She has a 5.00 pack-year smoking history. She has never used smokeless tobacco. She reports that she does not drink alcohol and does not use drugs.  Allergies  Allergen Reactions   Meclizine Diarrhea   Penicillins Rash and Other (See Comments)    Has patient had a PCN reaction causing immediate rash, facial/tongue/throat swelling, SOB or lightheadedness with hypotension: no Has patient had a PCN reaction causing severe rash involving mucus membranes or skin necrosis: No Has patient had a PCN reaction that required hospitalization No Has patient had a PCN reaction occurring within the last 10 years: No If all of the above answers are "NO", then may proceed with Cephalosporin use.     Family History  Problem Relation Age of Onset   COPD Father    COPD Sister    COPD Sister    Diabetes Sister    Diabetes Brother  Seizures Mother    Bone cancer Brother    Heart disease Brother    Aneurysm Brother        brain   HIV/AIDS Brother     Prior to Admission medications   Medication Sig Start Date End Date Taking? Authorizing Provider  acetaminophen (TYLENOL) 500 MG tablet Take 1,000 mg by mouth every 6 (six) hours as needed for mild pain or moderate pain.    Yes [provider]  acyclovir (ZOVIRAX) 400 MG tablet Take 1 tablet (400 mg total) by mouth 5 (five) times daily. 06/30/22   Yes Kerri Perches, MD  alendronate (FOSAMAX) 70 MG tablet TAKE 1 TABLET BY MOUTH EVERY 7  DAYS WITH A FULL GLASS OF WATER  ON AN EMPTY STOMACH Patient taking differently: Take 70 mg by mouth once a week. 10/17/21  Yes Kerri Perches, MD  amLODipine-benazepril (LOTREL) 5-40 MG capsule Take 1 capsule by mouth daily.   Yes [provider]  atorvastatin (LIPITOR) 20 MG tablet TAKE 1 TABLET BY MOUTH IN THE  EVENING 04/02/22  Yes Kerri Perches, MD  Calcium Carb-Cholecalciferol (CALTRATE 600+D3) 600-20 MG-MCG TABS Take 1 tablet by mouth daily. Take one tablet by mouth one time daily 01/07/22  Yes Kerri Perches, MD  ELIQUIS 5 MG TABS tablet TAKE 1 TABLET BY MOUTH TWICE  DAILY 10/03/21  Yes Kerri Perches, MD  FLUoxetine (PROZAC) 10 MG capsule TAKE 1 CAPSULE BY MOUTH DAILY 10/03/21  Yes Kerri Perches, MD  meclizine (ANTIVERT) 25 MG tablet TAKE 1 TABLET BY MOUTH THREE TIMES DAILY AS NEEDED FOR DIZZINESS Patient taking differently: Take 25 mg by mouth 3 (three) times daily as needed for dizziness. 05/19/21  Yes Kerri Perches, MD  metoprolol tartrate (LOPRESSOR) 25 MG tablet Take 12.5 mg by mouth 2 (two) times daily.   Yes [provider]  potassium chloride SA (KLOR-CON M) 20 MEQ tablet Take 1.5 tablets (30 mEq total) by mouth daily. 03/30/22  Yes Sharlene Dory, NP  temazepam (RESTORIL) 30 MG capsule TAKE 1 CAPSULE BY MOUTH EVERY  NIGHT AT BEDTIME 03/05/22  Yes Kerri Perches, MD  Torsemide 40 MG TABS Take 60 mg by mouth daily. 03/30/22 07/02/22 Yes Sharlene Dory, NP  vitamin C (ASCORBIC ACID) 500 MG tablet Take 500 mg by mouth daily.   Yes [provider]  Vitamin D, Ergocalciferol, (DRISDOL) 1.25 MG (50000 UNIT) CAPS capsule TAKE 1 CAPSULE BY MOUTH ONCE  WEEKLY 06/04/22  Yes Kerri Perches, MD  acyclovir (ZOVIRAX) 400 MG tablet TAKE 1 TABLET BY MOUTH TWICE  DAILY Patient not taking: Reported on 07/02/2022 01/19/22   Kerri Perches, MD   dapagliflozin propanediol (FARXIGA) 10 MG TABS tablet Take 1 tablet (10 mg total) by mouth daily before breakfast. Patient not taking: Reported on 07/02/2022 02/25/22   Sharlene Dory, NP  Elastic Bandages & Supports MISC 1 Package by Does not apply route as directed. 02/25/22   Sharlene Dory, NP  furosemide (LASIX) 20 MG tablet Take by mouth. Patient not taking: Reported on 07/02/2022 06/19/22   [provider]  Misc. Devices (TOILET SAFETY FRAME) MISC 1 each by Does not apply route daily as needed. 04/13/17   Eustace Moore, MD  ondansetron (ZOFRAN) 4 MG tablet Take 1 tablet (4 mg total) by mouth every 8 (eight) hours as needed for nausea or vomiting. Patient not taking: Reported on 07/02/2022 06/30/22   Kerri Perches, MD    Physical Exam: Vitals:  07/02/22 1109 07/02/22 1115 07/02/22 1342  BP:  107/67   Pulse:  64   Resp:  15   Temp:   98.4 F (36.9 C)  TempSrc:   Oral  SpO2:  94%   Weight: 88 kg    Height: 5\' 7"  (1.702 m)      Constitutional: NAD, calm, comfortable Eyes: PERRL, lids and conjunctivae normal ENMT: Mucous membranes are moist. Posterior pharynx clear of any exudate or lesions.Normal dentition.  Neck: normal, supple, no masses, no thyromegaly Respiratory: clear to auscultation bilaterally, no wheezing, no crackles. Normal respiratory effort. No accessory muscle use.  Cardiovascular: normal s1, s2 sounds, no murmurs / rubs / gallops. No extremity edema. 2+ pedal pulses. No carotid bruits.  Abdomen: mild to moderate abdminal distension, no tenderness, no masses palpated. No hepatosplenomegaly. Bowel sounds positive.  Musculoskeletal: 1+ pretibial edema BLEs. no clubbing / cyanosis. No joint deformity upper and lower extremities. Good ROM, no contractures. Normal muscle tone.  Skin: no rashes, lesions, ulcers. No induration Neurologic: CN 2-12 grossly intact. Sensation intact, DTR normal. Strength 5/5 in all 4.  Psychiatric: Normal judgment and insight. Alert  and oriented x 3. Normal mood.   Labs on Admission: I have personally reviewed following labs and imaging studies  CBC: Recent Labs  Lab 06/30/22 1533 07/02/22 1148  WBC 7.3 7.3  NEUTROABS 4.6  --   HGB 12.6 12.2  HCT 38.9 38.5  MCV 94.0 95.1  PLT 224 230   Basic Metabolic Panel: Recent Labs  Lab 06/30/22 1533 07/02/22 1148  NA 136 137  K 3.9 3.6  CL 104 105  CO2 22 22  GLUCOSE 101* 97  BUN 31* 22  CREATININE 1.20* 0.97  CALCIUM 9.2 9.0   GFR: Estimated Creatinine Clearance: 53.6 mL/min (by C-G formula based on SCr of 0.97 mg/dL). Liver Function Tests: No results for input(s): "AST", "ALT", "ALKPHOS", "BILITOT", "PROT", "ALBUMIN" in the last 168 hours. No results for input(s): "LIPASE", "AMYLASE" in the last 168 hours. No results for input(s): "AMMONIA" in the last 168 hours. Coagulation Profile: No results for input(s): "INR", "PROTIME" in the last 168 hours. Cardiac Enzymes: No results for input(s): "CKTOTAL", "CKMB", "CKMBINDEX", "TROPONINI" in the last 168 hours. BNP (last 3 results) No results for input(s): "PROBNP" in the last 8760 hours. HbA1C: No results for input(s): "HGBA1C" in the last 72 hours. CBG: No results for input(s): "GLUCAP" in the last 168 hours. Lipid Profile: No results for input(s): "CHOL", "HDL", "LDLCALC", "TRIG", "CHOLHDL", "LDLDIRECT" in the last 72 hours. Thyroid Function Tests: No results for input(s): "TSH", "T4TOTAL", "FREET4", "T3FREE", "THYROIDAB" in the last 72 hours. Anemia Panel: No results for input(s): "VITAMINB12", "FOLATE", "FERRITIN", "TIBC", "IRON", "RETICCTPCT" in the last 72 hours. Urine analysis:    Component Value Date/Time   COLORURINE YELLOW 10/03/2018 0817   APPEARANCEUR CLOUDY (A) 10/03/2018 0817   LABSPEC 1.012 10/03/2018 0817   PHURINE 5.5 10/03/2018 0817   GLUCOSEU NEGATIVE 10/03/2018 0817   HGBUR 1+ (A) 10/03/2018 0817   HGBUR trace-intact 04/10/2010 1331   BILIRUBINUR negative 06/30/2022 1430    BILIRUBINUR negative 12/30/2015 1554   KETONESUR negative 06/30/2022 1430   KETONESUR NEGATIVE 10/03/2018 0817   PROTEINUR TRACE (A) 10/03/2018 0817   UROBILINOGEN 0.2 06/30/2022 1430   UROBILINOGEN 0.2 05/06/2011 1730   NITRITE Negative 06/30/2022 1430   NITRITE NEGATIVE 05/10/2018 1036   LEUKOCYTESUR Negative 06/30/2022 1430   LEUKOCYTESUR TRACE (A) 05/10/2018 1036    Radiological Exams on Admission: DG Chest Port 1  View  Result Date: 07/02/2022 CLINICAL DATA:  Dyspnea EXAM: PORTABLE CHEST 1 VIEW COMPARISON:  Radiograph 2 days prior. FINDINGS: The heart is enlarged, unchanged. The upper mediastinal contours are stable. There are probable trace left larger than right pleural effusions with adjacent left basilar airspace opacity likely reflecting atelectasis, unchanged. There is no new or worsening focal airspace disease. There is no overt pulmonary edema. There is no pneumothorax There is no acute osseous abnormality. IMPRESSION: Probable trace left larger than right pleural effusions with left basilar atelectasis, unchanged. No new or worsening focal airspace disease. Electronically Signed   By: Lesia Hausen M.D.   On: 07/02/2022 11:47   DG Chest 2 View  Result Date: 06/30/2022 CLINICAL DATA:  Subacute cough. Shortness of breath. Chronic congestive heart failure. EXAM: CHEST - 2 VIEW COMPARISON:  Radiograph 06/19/2022. Chest CT 01/27/2021 FINDINGS: Stable cardiomegaly. Unchanged mediastinal contours. There are small bilateral pleural effusions. No pulmonary edema. No focal airspace disease. No pneumothorax. Left axillary surgical clips. On limited assessment, no acute osseous findings. IMPRESSION: 1. Small bilateral pleural effusions. No pulmonary edema. 2. Stable cardiomegaly. 3. No focal airspace disease or evidence of pneumonia. Electronically Signed   By: Narda Rutherford M.D.   On: 06/30/2022 16:06    EKG: Independently reviewed. Atrial fibrillation with slow ventricular response    Assessment/Plan Principal Problem:   Acute right heart failure (HCC) Active Problems:   History of breast cancer in female   Hyperlipidemia   Essential hypertension   Insomnia   Type 2 HSV infection of vulvovaginal region   Depression with anxiety   GERD (gastroesophageal reflux disease)   Unsteady gait   Obesity (BMI 30.0-34.9)   Volume overload   Atrial fibrillation (HCC)   Hx of pulmonary embolus   Severe tricuspid regurgitation   (HFpEF) heart failure with preserved ejection fraction (HCC)   Nausea   Shortness of breath   Right heart enlargement    Decompensated right heart failure / Acute HFpEF -admit to cardiac monitored bed -IV furosemide 60 mg twice daily, hold home oral torsemide -Low-sodium diet with fluid restriction -Monitor intake and output and daily weights closely -Monitor daily BMP -Resume potassium supplement 20 mEq oral twice daily -Inpatient cardiology services to evaluate 07/03/2022 -Obtain ReDs vest reading every morning  -recent TTE 01/24: LVEF 60-65%, indeterminate DD, severe TR -resumed home amlodipine/benazepril daily  -pt no longer on dapagliflozin, possibly due to cost   Persistent Atrial Fibrillation  -resumed home metoprolol tartrate 12.5 mg BID -resumed home apixaban 5 mg BID  -cardiology team considering ambulatory 2 week event monitoring   Nausea  - possibly secondary to recent course of antibiotics - supportive measures ordered  Question of UTI  - recent urine culture was negative for clinically significant infection   Dyslipidemia -resume home statin therapy -fasting lipid panel in AM to assess response to therapy  HSV -resumed home acyclovir therapy   Essential hypertension -monitor closely in setting of IV diuresis -resumed home amlodipine/benazepril   Depression/Anxiety -resumed home fluoxetine 10 mg daily    DVT prophylaxis: apixaban   Code Status: full   Family Communication: bedside 5/9   Disposition Plan:  anticipate home   Consults called: cardiology   Admission status: INP   Level of care: Telemetry Standley Dakins MD [facility] How to contact the [facility] Attending or Consulting provider 7A - 7P or covering provider during after hours 7P -7A, for this patient?  Check the care team in [facility] and look for a) attending/consulting  TR[facility] provider listed and b) the [facility] team listed Log into [email or web address] and use [facility] universal password to access. If you do not have the password, please contact the hospital operator. Locate the [facility] provider you are looking for under [facility] and page to a number that you can be directly reached. If you still have difficulty reaching the provider, please page the [facility] (Director on Call) for the Hospitalists listed on amion for assistance.   If 7PM-7AM, please contact night-coverage [email or web address] Password [email or web address]  07/02/2022, 2:03 PM

## 2022-07-03 ENCOUNTER — Inpatient Hospital Stay (HOSPITAL_COMMUNITY): Payer: Medicare Other

## 2022-07-03 DIAGNOSIS — I361 Nonrheumatic tricuspid (valve) insufficiency: Secondary | ICD-10-CM

## 2022-07-03 DIAGNOSIS — E8779 Other fluid overload: Secondary | ICD-10-CM

## 2022-07-03 DIAGNOSIS — I517 Cardiomegaly: Secondary | ICD-10-CM | POA: Diagnosis not present

## 2022-07-03 DIAGNOSIS — I50811 Acute right heart failure: Secondary | ICD-10-CM | POA: Diagnosis not present

## 2022-07-03 DIAGNOSIS — I5031 Acute diastolic (congestive) heart failure: Secondary | ICD-10-CM

## 2022-07-03 DIAGNOSIS — A6004 Herpesviral vulvovaginitis: Secondary | ICD-10-CM

## 2022-07-03 LAB — LIPID PANEL
Cholesterol: 85 mg/dL (ref 0–200)
HDL: 35 mg/dL — ABNORMAL LOW (ref >40)
LDL Cholesterol: 38 mg/dL (ref 0–99)
Total CHOL/HDL Ratio: 2.4 RATIO
Triglycerides: 61 mg/dL (ref <150)
VLDL: 12 mg/dL (ref 0–40)

## 2022-07-03 LAB — ECHOCARDIOGRAM COMPLETE
Area-P 1/2: 3.72 cm2
Height: 67 in
MV M vel: 4.31 m/s
MV Peak grad: 74.3 mmHg
MV VTI: 0.66 cm2
Radius: 0.5 cm
S' Lateral: 2.2 cm
Weight: 3054.69 oz

## 2022-07-03 LAB — BASIC METABOLIC PANEL
Anion gap: 9 (ref 5–15)
BUN: 23 mg/dL (ref 8–23)
CO2: 23 mmol/L (ref 22–32)
Calcium: 8.9 mg/dL (ref 8.9–10.3)
Chloride: 105 mmol/L (ref 98–111)
Creatinine, Ser: 0.93 mg/dL (ref 0.44–1.00)
GFR, Estimated: >60 mL/min (ref >60)
Glucose, Bld: 103 mg/dL — ABNORMAL HIGH (ref 70–99)
Potassium: 4.1 mmol/L (ref 3.5–5.1)
Sodium: 137 mmol/L (ref 135–145)

## 2022-07-03 LAB — BRAIN NATRIURETIC PEPTIDE: B Natriuretic Peptide: 519 pg/mL — ABNORMAL HIGH (ref 0.0–100.0)

## 2022-07-03 LAB — MAGNESIUM: Magnesium: 1.6 mg/dL — ABNORMAL LOW (ref 1.7–2.4)

## 2022-07-03 MED ORDER — FUROSEMIDE 10 MG/ML IJ SOLN
80.0000 mg | Freq: Two times a day (BID) | INTRAMUSCULAR | Status: DC
  Administered 2022-07-03 – 2022-07-06 (×6): 80 mg via INTRAVENOUS
  Filled 2022-07-03 (×7): qty 8

## 2022-07-03 MED ORDER — MAGNESIUM SULFATE 4 GM/100ML IV SOLN
4.0000 g | Freq: Once | INTRAVENOUS | Status: AC
  Administered 2022-07-03: 4 g via INTRAVENOUS
  Filled 2022-07-03: qty 100

## 2022-07-03 MED ORDER — POTASSIUM CHLORIDE CRYS ER 20 MEQ PO TBCR
20.0000 meq | EXTENDED_RELEASE_TABLET | Freq: Every day | ORAL | Status: DC
  Administered 2022-07-03 – 2022-07-07 (×5): 20 meq via ORAL
  Filled 2022-07-03 (×5): qty 1

## 2022-07-03 MED ORDER — ACYCLOVIR 800 MG PO TABS
400.0000 mg | ORAL_TABLET | Freq: Three times a day (TID) | ORAL | Status: DC
  Administered 2022-07-03 – 2022-07-07 (×12): 400 mg via ORAL
  Filled 2022-07-03 (×12): qty 1

## 2022-07-03 NOTE — Progress Notes (Signed)
  Echocardiogram 2D Echocardiogram has been performed.  Theresa Garcia 07/03/2022, 4:00 PM

## 2022-07-03 NOTE — Consult Note (Signed)
Cardiology Consultation:   Patient ID: Theresa Garcia; [id number]; 1940-11-25   Admit date: 07/02/2022 Date of Consult: 07/03/2022  Primary Care Provider: Kerri Perches, MD Primary Cardiologist: Marjo Bicker, MD  History of Present Illness:   Theresa Garcia is a 82 y.o. female with past medical history outlined below seen by Dr. Jenene Slicker for routine follow-up yesterday reporting worsening dyspnea on exertion and increased lower leg edema over the last few weeks.  Some nausea but indicated stable appetite and no definitive weight gain.  No chest pain or palpitations, some orthopnea, no syncope.  No abdominal pain or changes in stools.  Also recent concern for dysuria with workup by PCP and prescription for Zofran due to nausea.  Patient without obvious fevers or chills.  Recent urinalysis showed trace blood with otherwise normal findings.  Urine culture without significant growth.  As an outpatient she is on torsemide 60 mg daily with potassium supplement and Farxiga along with Lotrel.  Her last echocardiogram was in January of this year at which point LVEF was 60 to 65%.  She had moderate RV enlargement at that time with RVSP estimated 42 mmHg.  She also has severe tricuspid regurgitation with incomplete leaflet coaptation secondary to annular dilatation.  Only mild to moderate mitral regurgitation noted at that time.  ROS:  Pertinent review in history of present illness.  No exertional chest pain.  Past Medical History:  Diagnosis Date   (HFpEF) heart failure with preserved ejection fraction (HCC)    A-fib (HCC)    Dx: 01/2022   Breast cancer (HCC) 02/24/2000   Bronchitis    Cancer (HCC)    Chronic back pain    Depression    GERD (gastroesophageal reflux disease)    Hematuria    Hyperlipidemia    Hypertension    Insomnia    Kidney stone    Personal history of chemotherapy    Personal history of radiation therapy    Pleural effusion    Pneumonia    Thyroid  goiter    Vertigo     Past Surgical History:  Procedure Laterality Date   ABDOMINAL HYSTERECTOMY     BREAST LUMPECTOMY Left 2002   BREAST SURGERY     left   CYSTOSCOPY/RETROGRADE/URETEROSCOPY/STONE EXTRACTION WITH BASKET  01/07/2011   Procedure: CYSTOSCOPY/RETROGRADE/URETEROSCOPY/STONE EXTRACTION WITH BASKET;  Surgeon: Ky Barban;  Location: [facility];  Service: Urology;  Laterality: Left;  Balloon Dilatation; stone given to family per MD   TUBAL LIGATION       Inpatient Medications: Scheduled Meds:  acyclovir  400 mg Oral 5 X Daily   amLODipine  5 mg Oral Daily   And   benazepril  40 mg Oral Daily   apixaban  5 mg Oral BID   atorvastatin  20 mg Oral QPM   calcium-vitamin D  1 tablet Oral Q breakfast   FLUoxetine  10 mg Oral Daily   furosemide  60 mg Intravenous Q12H   metoprolol tartrate  12.5 mg Oral BID   potassium chloride  20 mEq Oral Daily   sodium chloride flush  3 mL Intravenous Q12H   temazepam  30 mg Oral QHS   Continuous Infusions:  sodium chloride     magnesium sulfate bolus IVPB 4 g (07/03/22 0843)   PRN Meds: sodium chloride, acetaminophen, meclizine, ondansetron (ZOFRAN) IV, sodium chloride flush  Allergies:    Allergies  Allergen Reactions   Meclizine Diarrhea   Penicillins Rash and Other (See Comments)  Has patient had a PCN reaction causing immediate rash, facial/tongue/throat swelling, SOB or lightheadedness with hypotension: no Has patient had a PCN reaction causing severe rash involving mucus membranes or skin necrosis: No Has patient had a PCN reaction that required hospitalization No Has patient had a PCN reaction occurring within the last 10 years: No If all of the above answers are "NO", then may proceed with Cephalosporin use.     Social History:   Social History   Tobacco Use   Smoking status: Former    Packs/day: 1.00    Years: 5.00    Additional pack years: 0.00    Total pack years: 5.00    Types: Cigarettes    Quit date:  09/07/1980    Years since quitting: 41.8   Smokeless tobacco: Never   Tobacco comments:    quit 25+ years ago  Substance Use Topics   Alcohol use: No    Alcohol/week: 0.0 standard drinks of alcohol    Family History:   The patient's family history includes Aneurysm in her brother; Bone cancer in her brother; COPD in her father, sister, and sister; Diabetes in her brother and sister; HIV/AIDS in her brother; Heart disease in her brother; Seizures in her mother.  Physical Exam/Data:   Vitals:   07/02/22 2339 07/03/22 0326 07/03/22 0500 07/03/22 0842  BP: 97/60 108/67  113/72  Pulse: 60 60  62  Resp: 18 16  16   Temp: 98.4 F (36.9 C) 98 F (36.7 C)  98.2 F (36.8 C)  TempSrc:      SpO2: 96% 96%  98%  Weight:   86.6 kg   Height:        Intake/Output Summary (Last 24 hours) at 07/03/2022 1017 Last data filed at 07/03/2022 0930 Gross per 24 hour  Intake 720 ml  Output --  Net 720 ml   Filed Weights   07/02/22 1109 07/02/22 1415 07/03/22 0500  Weight: 88 kg 86.6 kg 86.6 kg   Body mass index is 29.9 kg/m.   Gen: Patient appears comfortable at rest. HEENT: Conjunctiva and lids normal. Neck: Supple, elevated JVP, no carotid bruits. Lungs: Few crackles at the bases, nonlabored at rest. Cardiac: Irregularly irregular without gallop, soft apical systolic murmur. Abdomen: Soft, nontender, bowel sounds present. Extremities: 2+ lower leg edema with venous varicosities. Skin: Warm and dry. Musculoskeletal: No kyphosis. Neuropsychiatric: Alert and oriented x3, affect grossly appropriate.  EKG:  An ECG dated 07/02/2022 was personally reviewed today and demonstrated:  Rate controlled atrial fibrillation.  Telemetry:  I personally reviewed telemetry which shows atrial fibrillation.  Relevant CV Studies:  Echocardiogram 03/11/2022:  1. Left ventricular ejection fraction, by estimation, is 60 to 65%. The  left ventricle has normal function. The left ventricle has no regional  wall  motion abnormalities. There is mild concentric left ventricular  hypertrophy. Left ventricular diastolic  parameters are indeterminate. There is the interventricular septum is  flattened in diastole ('D' shaped left ventricle), consistent with right  ventricular volume overload. The average left ventricular global  longitudinal strain is -20.3 %. The global  longitudinal strain is normal.   2. Right ventricular systolic function is normal. The right ventricular  size is moderately enlarged. There is mildly elevated pulmonary artery  systolic pressure. The estimated right ventricular systolic pressure is  42.2 mmHg.   3. Right atrial size was mildly dilated.   4. The mitral valve is mildly degenerative. Mild to moderate mitral valve  regurgitation, posteriorly directed.   5.  The tricuspid valve is abnormal with incomplete coaptation due to  annular dilatation. Tricuspid valve regurgitation is severe.   6. The aortic valve is tricuspid. Aortic valve regurgitation is trivial.  Aortic valve sclerosis is present, with no evidence of aortic valve  stenosis.   7. The inferior vena cava is dilated in size with <50% respiratory  variability, suggesting right atrial pressure of 15 mmHg.   Laboratory Data:  Chemistry Recent Labs  Lab 06/30/22 1533 07/02/22 1148 07/03/22 0421  NA 136 137 137  K 3.9 3.6 4.1  CL 104 105 105  CO2 22 22 23   GLUCOSE 101* 97 103*  BUN 31* 22 23  CREATININE 1.20* 0.97 0.93  CALCIUM 9.2 9.0 8.9  GFRNONAA 46* 59* >60  ANIONGAP 10 10 9      Hematology Recent Labs  Lab 06/30/22 1533 07/02/22 1148  WBC 7.3 7.3  RBC 4.14 4.05  HGB 12.6 12.2  HCT 38.9 38.5  MCV 94.0 95.1  MCH 30.4 30.1  MCHC 32.4 31.7  RDW 16.1* 16.5*  PLT 224 230    BNP Recent Labs  Lab 07/02/22 1148 07/03/22 0421  BNP 405.0* 519.0*     Lipid Panel     Component Value Date/Time   CHOL 85 07/03/2022 0421   CHOL 149 07/04/2021 1341   TRIG 61 07/03/2022 0421   HDL 35 (L)  07/03/2022 0421   HDL 53 07/04/2021 1341   CHOLHDL 2.4 07/03/2022 0421   VLDL 12 07/03/2022 0421   LDLCALC 38 07/03/2022 0421   LDLCALC 78 07/04/2021 1341   LDLCALC 79 04/25/2019 1053   LABVLDL 18 07/04/2021 1341    Radiology/Studies:  DG Chest Port 1 View  Result Date: 07/02/2022 CLINICAL DATA:  Dyspnea EXAM: PORTABLE CHEST 1 VIEW COMPARISON:  Radiograph 2 days prior. FINDINGS: The heart is enlarged, unchanged. The upper mediastinal contours are stable. There are probable trace left larger than right pleural effusions with adjacent left basilar airspace opacity likely reflecting atelectasis, unchanged. There is no new or worsening focal airspace disease. There is no overt pulmonary edema. There is no pneumothorax There is no acute osseous abnormality. IMPRESSION: Probable trace left larger than right pleural effusions with left basilar atelectasis, unchanged. No new or worsening focal airspace disease. Electronically Signed   By: Lesia Hausen M.D.   On: 07/02/2022 11:47   DG Chest 2 View  Result Date: 06/30/2022 CLINICAL DATA:  Subacute cough. Shortness of breath. Chronic congestive heart failure. EXAM: CHEST - 2 VIEW COMPARISON:  Radiograph 06/19/2022. Chest CT 01/27/2021 FINDINGS: Stable cardiomegaly. Unchanged mediastinal contours. There are small bilateral pleural effusions. No pulmonary edema. No focal airspace disease. No pneumothorax. Left axillary surgical clips. On limited assessment, no acute osseous findings. IMPRESSION: 1. Small bilateral pleural effusions. No pulmonary edema. 2. Stable cardiomegaly. 3. No focal airspace disease or evidence of pneumonia. Electronically Signed   By: Narda Rutherford M.D.   On: 06/30/2022 16:06    Assessment and Plan:   1.  Acute on chronic HFpEF.  Reports worsening dyspnea exertion and leg swelling over the last few weeks despite consistent outpatient diuretic regimen as discussed above.  No definitive weight gain during this time.  BNP 519.  She does  have moderate RV dilatation in the setting of severe tricuspid regurgitation with annular dilatation so component of right sided symptoms also more prominent.  She has trace pleural effusions by chest x-ray otherwise.  Currently admitted for IV diuresis, net output of approximately 800 cc so far.  Renal function stable.  Last echocardiogram was in January.  2.  Severe tricuspid regurgitation due to annular dilatation and incomplete leaflet coaptation, RV moderately dilated although with relatively preserved contraction as of January and mildly elevated RVSP.  3.  Persistent atrial fibrillation with CHA2DS2-VASc score of 4-5.  Heart rate is controlled on Lopressor and she is on Eliquis for stroke prophylaxis.  Continue Lasix 80 mg IV twice daily for now, follow-up urine output and renal function.  Would go ahead and update echocardiogram in comparison to study from January to ensure no major change in cardiac structure and function as well as valvular status.  No obvious UTI by recent testing, can likely resume [place] as well.  May be that she just needs a higher dose of Demadex as an outpatient.  Whether or not she needs further procedural management of her tricuspid regurgitation can be reviewed as an outpatient.  For questions or updates, please contact [facility] Please consult [email or web address] for contact info under   Signed, Nona Dell, MD  07/03/2022 10:17 AM

## 2022-07-03 NOTE — TOC Progression Note (Signed)
  Transition of Care [facility]) Screening Note   Patient Details  Name: Theresa Garcia Date of Birth: 1940/03/06   Transition of Care [facility]) CM/SW Contact:    Elliot Gault, LCSW Phone Number: 07/03/2022, 9:46 AM    CHF educational information added to AVS.  Transition of Care Department [facility]) has reviewed patient and no TOC needs have been identified at this time. We will continue to monitor patient advancement through interdisciplinary progression rounds. If new patient transition needs arise, please place a TOC consult.

## 2022-07-03 NOTE — Care Management Important Message (Signed)
Important Message  Patient Details  Name: Theresa Garcia MRN: [id number] Date of Birth: 01-19-1941   Medicare Important Message Given:  N/A - LOS <3 / Initial given by admissions     Corey Harold 07/03/2022, 10:31 AM

## 2022-07-03 NOTE — Progress Notes (Signed)
PROGRESS NOTE   Theresa Garcia  NGE:[id number] DOB: 1940/07/01 DOA: 07/02/2022 PCP: Kerri Perches, MD   Chief Complaint  Patient presents with   Leg Swelling   Level of care: Telemetry  Brief Admission History:  82 year old female retired from [employer] plant in [place] after 32 years with atrial fibrillation, severe atrial functional TR with moderate RV enlargement, hypertension, osteoporosis, hyperlipidemia, HFpEF, history of breast cancer treated with chemo and radiation, GERD, chronic back pain, nephrolithiasis who was sent to the emergency department from cardiology office visit earlier today.  She complained of DOE, two-pillow orthopnea and PND associated with abdominal distention and increasing lower extremity edema progressive over the past 2 weeks.  She was notably volume overloaded and also complained of ongoing nausea and intermittent dizziness.  Dr. Jenene Slicker was concerned about decompensated right heart failure and referred patient to ED.  She had recently been treated for UTI by her PCP.  Patient's urine culture tested negative.  Patient was given IV furosemide in the emergency department and already starting to have a diuresis.  Her cardiac BNP was elevated at 405.0.  She had trace bilateral pleural effusions and atelectasis on chest x-ray.  Admission was requested for further management with IV diuresis with furosemide.   Assessment and Plan:  Decompensated right heart failure / Acute HFpEF -admit to cardiac monitored bed -IV furosemide 80 mg twice daily, hold home oral torsemide -Low-sodium diet with fluid restriction -Monitor intake and output and daily weights closely -Monitor daily BMP -Resume potassium supplement 20 mEq oral twice daily -Inpatient cardiology services to evaluate 07/03/2022 -Obtain ReDs vest reading every morning  -recent TTE 01/24: LVEF 60-65%, indeterminate DD, severe TR -resumed home amlodipine/benazepril daily  -pt no longer on dapagliflozin,  possibly due to cost     Intake/Output Summary (Last 24 hours) at 07/03/2022 1408 Last data filed at 07/03/2022 1100 Gross per 24 hour  Intake 720 ml  Output 1001 ml  Net -281 ml   Filed Weights   07/02/22 1109 07/02/22 1415 07/03/22 0500  Weight: 88 kg 86.6 kg 86.6 kg   Persistent Atrial Fibrillation  -resumed home metoprolol tartrate 12.5 mg BID -resumed home apixaban 5 mg BID  -cardiology team considering ambulatory 2 week event monitoring    Nausea  - possibly secondary to recent course of antibiotics - supportive measures ordered   Question of UTI  - recent urine culture was negative for clinically significant infection  - ruled out   Dyslipidemia -resume home statin therapy -fasting lipid panel reassuring with LDL of 38   HSV genital outbreak -resumed acyclovir therapy to complete 7 d course   Essential hypertension -monitor closely in setting of IV diuresis -resumed home amlodipine/benazepril    Depression/Anxiety -resumed home fluoxetine 10 mg daily    DVT prophylaxis: apixaban  Code Status: full  Family Communication:  Disposition: Status is: Inpatient Remains inpatient appropriate because: IV diuresis with furosemide    Consultants:  Cardio  Procedures:   Antimicrobials:    Subjective: Pt reports about the same but is urinating more but not back to her baseline.   Objective: Vitals:   07/02/22 2339 07/03/22 0326 07/03/22 0500 07/03/22 0842  BP: 97/60 108/67  113/72  Pulse: 60 60  62  Resp: 18 16  16   Temp: 98.4 F (36.9 C) 98 F (36.7 C)  98.2 F (36.8 C)  TempSrc:      SpO2: 96% 96%  98%  Weight:   86.6 kg   Height:  Intake/Output Summary (Last 24 hours) at 07/03/2022 1405 Last data filed at 07/03/2022 1100 Gross per 24 hour  Intake 720 ml  Output 1001 ml  Net -281 ml   Filed Weights   07/02/22 1109 07/02/22 1415 07/03/22 0500  Weight: 88 kg 86.6 kg 86.6 kg   Examination:  General exam: Appears calm and comfortable   Respiratory system: Clear to auscultation. Respiratory effort normal. Cardiovascular system: normal S1 & S2 heard. No JVD, murmurs, rubs, gallops or clicks. No pedal edema. Gastrointestinal system: Abdomen is nondistended, soft and nontender. No organomegaly or masses felt. Normal bowel sounds heard. Central nervous system: Alert and oriented. No focal neurological deficits. Extremities: 1+ pitting bilateral pretibial edema.  Symmetric 5 x 5 power. Skin: No rashes, lesions or ulcers. Psychiatry: Judgement and insight appear normal. Mood & affect appropriate.   Data Reviewed: I have personally reviewed following labs and imaging studies  CBC: Recent Labs  Lab 06/30/22 1533 07/02/22 1148  WBC 7.3 7.3  NEUTROABS 4.6  --   HGB 12.6 12.2  HCT 38.9 38.5  MCV 94.0 95.1  PLT 224 230    Basic Metabolic Panel: Recent Labs  Lab 06/30/22 1533 07/02/22 1148 07/03/22 0421  NA 136 137 137  K 3.9 3.6 4.1  CL 104 105 105  CO2 22 22 23   GLUCOSE 101* 97 103*  BUN 31* 22 23  CREATININE 1.20* 0.97 0.93  CALCIUM 9.2 9.0 8.9  MG  --   --  1.6*    CBG: No results for input(s): "GLUCAP" in the last 168 hours.  Recent Results (from the past 240 hour(s))  Urine Culture     Status: None   Collection Time: 06/30/22  2:30 PM   Specimen: Urine   UR  Result Value Ref Range Status   Urine Culture, Routine Final report  Final   Organism ID, Bacteria Comment  Final    Comment: Culture shows less than 10,000 colony forming units of bacteria per milliliter of urine. This colony count is not generally considered to be clinically significant.      Radiology Studies: DG Chest Port 1 View  Result Date: 07/02/2022 CLINICAL DATA:  Dyspnea EXAM: PORTABLE CHEST 1 VIEW COMPARISON:  Radiograph 2 days prior. FINDINGS: The heart is enlarged, unchanged. The upper mediastinal contours are stable. There are probable trace left larger than right pleural effusions with adjacent left basilar airspace opacity  likely reflecting atelectasis, unchanged. There is no new or worsening focal airspace disease. There is no overt pulmonary edema. There is no pneumothorax There is no acute osseous abnormality. IMPRESSION: Probable trace left larger than right pleural effusions with left basilar atelectasis, unchanged. No new or worsening focal airspace disease. Electronically Signed   By: Lesia Hausen M.D.   On: 07/02/2022 11:47    Scheduled Meds:  acyclovir  400 mg Oral TID   amLODipine  5 mg Oral Daily   And   benazepril  40 mg Oral Daily   apixaban  5 mg Oral BID   atorvastatin  20 mg Oral QPM   calcium-vitamin D  1 tablet Oral Q breakfast   FLUoxetine  10 mg Oral Daily   furosemide  60 mg Intravenous Q12H   metoprolol tartrate  12.5 mg Oral BID   potassium chloride  20 mEq Oral Daily   sodium chloride flush  3 mL Intravenous Q12H   temazepam  30 mg Oral QHS   Continuous Infusions:  sodium chloride      LOS:  1 day   Time spent: 35 mins  Khalise Billard Laural Benes, MD How to contact the [facility] Attending or Consulting provider 7A - 7P or covering provider during after hours 7P -7A, for this patient?  Check the care team in [facility] and look for a) attending/consulting [facility] provider listed and b) the [facility] team listed Log into [email or web address] and use [place]'s universal password to access. If you do not have the password, please contact the hospital operator. Locate the [facility] provider you are looking for under [facility] and page to a number that you can be directly reached. If you still have difficulty reaching the provider, please page the [facility] (Director on Call) for the Hospitalists listed on amion for assistance.  07/03/2022, 2:05 PM

## 2022-07-04 DIAGNOSIS — A6004 Herpesviral vulvovaginitis: Secondary | ICD-10-CM | POA: Diagnosis not present

## 2022-07-04 DIAGNOSIS — E8779 Other fluid overload: Secondary | ICD-10-CM | POA: Diagnosis not present

## 2022-07-04 DIAGNOSIS — I50811 Acute right heart failure: Secondary | ICD-10-CM | POA: Diagnosis not present

## 2022-07-04 DIAGNOSIS — I517 Cardiomegaly: Secondary | ICD-10-CM | POA: Diagnosis not present

## 2022-07-04 LAB — BASIC METABOLIC PANEL
Anion gap: 7 (ref 5–15)
BUN: 22 mg/dL (ref 8–23)
CO2: 25 mmol/L (ref 22–32)
Calcium: 9 mg/dL (ref 8.9–10.3)
Chloride: 105 mmol/L (ref 98–111)
Creatinine, Ser: 0.92 mg/dL (ref 0.44–1.00)
GFR, Estimated: >60 mL/min (ref >60)
Glucose, Bld: 108 mg/dL — ABNORMAL HIGH (ref 70–99)
Potassium: 4 mmol/L (ref 3.5–5.1)
Sodium: 137 mmol/L (ref 135–145)

## 2022-07-04 LAB — MAGNESIUM: Magnesium: 2 mg/dL (ref 1.7–2.4)

## 2022-07-04 MED ORDER — MELATONIN 3 MG PO TABS
6.0000 mg | ORAL_TABLET | Freq: Every day | ORAL | Status: DC
  Administered 2022-07-04 – 2022-07-06 (×3): 6 mg via ORAL
  Filled 2022-07-04 (×3): qty 2

## 2022-07-04 NOTE — Progress Notes (Signed)
PROGRESS NOTE   Theresa Garcia  WUJ:[id number] DOB: 1940/09/18 DOA: 07/02/2022 PCP: Theresa Perches, MD   Chief Complaint  Patient presents with   Leg Swelling   Level of care: Telemetry  Brief Admission History:  82 year old female retired from [employer] in [place] after 32 years with atrial fibrillation, severe atrial functional TR with moderate RV enlargement, hypertension, osteoporosis, hyperlipidemia, HFpEF, history of breast cancer treated with chemo and radiation, GERD, chronic back pain, nephrolithiasis who was sent to the emergency department from cardiology office visit earlier today.  She complained of DOE, two-pillow orthopnea and PND associated with abdominal distention and increasing lower extremity edema progressive over the past 2 weeks.  She was notably volume overloaded and also complained of ongoing nausea and intermittent dizziness.  Dr. Jenene Garcia was concerned about decompensated right heart failure and referred patient to ED.  She had recently been treated for UTI by her PCP.  Patient's urine culture tested negative.  Patient was given IV furosemide in the emergency department and already starting to have a diuresis.  Her cardiac BNP was elevated at 405.0.  She had trace bilateral pleural effusions and atelectasis on chest x-ray.  Admission was requested for further management with IV diuresis with furosemide.   Assessment and Plan:  Decompensated right heart failure / Acute HFpEF -admit to cardiac monitored bed -IV furosemide 80 mg twice daily, hold home oral torsemide -Low-sodium diet with fluid restriction -Monitor intake and output and daily weights closely -Monitor daily BMP -Resume potassium supplement 20 mEq oral twice daily -Inpatient cardiology services to evaluate 07/03/2022 -Obtain ReDs vest reading every morning  -recent TTE 01/24: LVEF 60-65%, indeterminate DD, severe TR -resumed home amlodipine/benazepril daily  -pt no longer on dapagliflozin,  possibly due to cost  -repeat limited TTE 07/04/22: LVEF 60-65%, findings of RV volume overload, severe TR, small pericardial effusion  Intake/Output Summary (Last 24 hours) at 07/04/2022 1245 Last data filed at 07/04/2022 1015 Gross per 24 hour  Intake 720 ml  Output 1070 ml  Net -350 ml   Filed Weights   07/02/22 1415 07/03/22 0500 07/04/22 0414  Weight: 86.6 kg 86.6 kg 89.2 kg   Persistent Atrial Fibrillation  -resumed home metoprolol tartrate 12.5 mg BID -resumed home apixaban 5 mg BID  -cardiology team considering ambulatory 2 week event monitoring    Nausea  - possibly secondary to recent course of antibiotics - supportive measures ordered   Question of UTI  - recent urine culture was negative for clinically significant infection  - ruled out   Dyslipidemia -resume home statin therapy -fasting lipid panel reassuring with LDL of 38   HSV genital outbreak -resumed acyclovir therapy to complete 7 d course   Essential hypertension -monitor closely in setting of IV diuresis -resumed home amlodipine/benazepril    Depression/Anxiety -resumed home fluoxetine 10 mg daily    DVT prophylaxis: apixaban  Code Status: full  Family Communication:  Disposition: Status is: Inpatient Remains inpatient appropriate because: IV diuresis with furosemide    Consultants:  Cardio  Procedures:   Antimicrobials:    Subjective: Pt reports about the same but is urinating more but not back to her baseline.   Objective: Vitals:   07/03/22 2035 07/03/22 2217 07/04/22 0220 07/04/22 0414  BP: 120/88 130/83 117/78 114/77  Pulse: 74  (!) 50 60  Resp: 18  18 18   Temp: 98.4 F (36.9 C)  98 F (36.7 C) 98.6 F (37 C)  TempSrc: Oral  Oral  SpO2: 100%  96% 94%  Weight:    89.2 kg  Height:        Intake/Output Summary (Last 24 hours) at 07/04/2022 1245 Last data filed at 07/04/2022 1015 Gross per 24 hour  Intake 720 ml  Output 1070 ml  Net -350 ml   Filed Weights   07/02/22  1415 07/03/22 0500 07/04/22 0414  Weight: 86.6 kg 86.6 kg 89.2 kg   Examination:  General exam: Appears calm and comfortable  Respiratory system: Clear to auscultation. Respiratory effort normal. Cardiovascular system: normal S1 & S2 heard. No JVD, murmurs, rubs, gallops or clicks. No pedal edema. Gastrointestinal system: Abdomen is nondistended, soft and nontender. No organomegaly or masses felt. Normal bowel sounds heard. Central nervous system: Alert and oriented. No focal neurological deficits. Extremities: 1+ pitting bilateral pretibial edema.  Symmetric 5 x 5 power. Skin: No rashes, lesions or ulcers. Psychiatry: Judgement and insight appear normal. Mood & affect appropriate.   Data Reviewed: I have personally reviewed following labs and imaging studies  CBC: Recent Labs  Lab 06/30/22 1533 07/02/22 1148  WBC 7.3 7.3  NEUTROABS 4.6  --   HGB 12.6 12.2  HCT 38.9 38.5  MCV 94.0 95.1  PLT 224 230    Basic Metabolic Panel: Recent Labs  Lab 06/30/22 1533 07/02/22 1148 07/03/22 0421 07/04/22 0554  NA 136 137 137 137  K 3.9 3.6 4.1 4.0  CL 104 105 105 105  CO2 22 22 23 25   GLUCOSE 101* 97 103* 108*  BUN 31* 22 23 22   CREATININE 1.20* 0.97 0.93 0.92  CALCIUM 9.2 9.0 8.9 9.0  MG  --   --  1.6* 2.0    CBG: No results for input(s): "GLUCAP" in the last 168 hours.  Recent Results (from the past 240 hour(s))  Urine Culture     Status: None   Collection Time: 06/30/22  2:30 PM   Specimen: Urine   UR  Result Value Ref Range Status   Urine Culture, Routine Final report  Final   Organism ID, Bacteria Comment  Final    Comment: Culture shows less than 10,000 colony forming units of bacteria per milliliter of urine. This colony count is not generally considered to be clinically significant.      Radiology Studies: ECHOCARDIOGRAM COMPLETE  Result Date: 07/03/2022    ECHOCARDIOGRAM REPORT   Patient Name:   Theresa Garcia Date of Exam: 07/03/2022 Medical Rec #:   [id number]        Height:       67.0 in Accession #:    [id number]       Weight:       190.9 lb Date of Birth:  10-19-1940       BSA:          1.983 m Patient Age:    82 years         BP:           95/66 mmHg Patient Gender: F                HR:           67 bpm. Exam Location:  [facility] Procedure: 2D Echo, Cardiac Doppler and Color Doppler Indications:    Tricuspid Valve Disease I07.9                 CHF-Acute Diastolic I50.31  History:        Patient has prior history of Echocardiogram examinations, most  recent 01/28/2022. CHF and Right heart enlargement, History of                 breast cancer, TV Disease, Arrythmias:Atrial Fibrillation,                 Signs/Symptoms:Shortness of Breath; Risk Factors:Hypertension,                 Former Smoker and Dyslipidemia.  Sonographer:    Aron Baba Referring Phys: [id number] SAMUEL G MCDOWELL  Sonographer Comments: Image acquisition challenging due to patient body habitus and Image acquisition challenging due to respiratory motion. IMPRESSIONS  1. Left ventricular ejection fraction, by estimation, is 60 to 65%. The left ventricle has normal function. The left ventricle has no regional wall motion abnormalities. There is moderate concentric left ventricular hypertrophy. Left ventricular diastolic parameters are indeterminate. There is the interventricular septum is flattened in diastole ('D' shaped left ventricle), consistent with right ventricular volume overload.  2. Right ventricular systolic function is moderately reduced. The right ventricular size is moderately enlarged. There is normal pulmonary artery systolic pressure. The estimated right ventricular systolic pressure is 27.5 mmHg.  3. Left atrial size was mildly dilated.  4. Right atrial size was severely dilated.  5. A small pericardial effusion is present. The pericardial effusion is posterior to the left ventricle.  6. The mitral valve is degenerative. Mild to moderate mitral valve regurgitation.   7. The tricuspid valve is abnormal, incomplete coaptation secondardy to annular dilatation. Tricuspid valve regurgitation is severe.  8. The aortic valve is tricuspid. There is mild calcification of the aortic valve. Aortic valve regurgitation is mild. Aortic valve sclerosis is present, with no evidence of aortic valve stenosis.  9. Aortic dilatation noted. There is mild dilatation of the ascending aorta, measuring 36 mm. 10. Main pulmonary artery dilated. 11. The inferior vena cava is dilated in size with <50% respiratory variability, suggesting right atrial pressure of 15 mmHg. Comparison(s): Prior images reviewed side by side. LVEF normal range at 60-65%. There is moderate RV dysfunction with wide open tricupid regurgitation in the setting of incomplete leaflet coaptation and annular dilatation. FINDINGS  Left Ventricle: Left ventricular ejection fraction, by estimation, is 60 to 65%. The left ventricle has normal function. The left ventricle has no regional wall motion abnormalities. The left ventricular internal cavity size was normal in size. There is  moderate concentric left ventricular hypertrophy. The interventricular septum is flattened in diastole ('D' shaped left ventricle), consistent with right ventricular volume overload. Left ventricular diastolic function could not be evaluated due to atrial fibrillation. Left ventricular diastolic parameters are indeterminate. Right Ventricle: The right ventricular size is moderately enlarged. No increase in right ventricular wall thickness. Right ventricular systolic function is moderately reduced. There is normal pulmonary artery systolic pressure. The tricuspid regurgitant velocity is 1.77 m/s, and with an assumed right atrial pressure of 15 mmHg, the estimated right ventricular systolic pressure is 27.5 mmHg. Left Atrium: Left atrial size was mildly dilated. Right Atrium: Right atrial size was severely dilated. Pericardium: A small pericardial effusion is  present. The pericardial effusion is posterior to the left ventricle. Mitral Valve: The mitral valve is degenerative in appearance. There is mild thickening of the mitral valve leaflet(s). There is mild calcification of the mitral valve leaflet(s). Mild to moderate mitral valve regurgitation, with posteriorly-directed jet.  MV peak gradient, 35.8 mmHg. The mean mitral valve gradient is 2.0 mmHg. Tricuspid Valve: The tricuspid valve is abnormal. Tricuspid valve regurgitation is severe. Aortic  Valve: The aortic valve is tricuspid. There is mild calcification of the aortic valve. There is mild aortic valve annular calcification. Aortic valve regurgitation is mild. Aortic valve sclerosis is present, with no evidence of aortic valve stenosis. Pulmonic Valve: The pulmonic valve was grossly normal. Pulmonic valve regurgitation is mild to moderate. Aorta: The aortic root is normal in size and structure and aortic dilatation noted. There is mild dilatation of the ascending aorta, measuring 36 mm. Pulmonary Artery: Main pulmonary artery dilated. Venous: The inferior vena cava is dilated in size with less than 50% respiratory variability, suggesting right atrial pressure of 15 mmHg. IAS/Shunts: No atrial level shunt detected by color flow Doppler.  LEFT VENTRICLE PLAX 2D LVIDd:         3.70 cm   Diastology LVIDs:         2.20 cm   LV e' medial:    8.59 cm/s LV PW:         1.30 cm   LV E/e' medial:  7.9 LV IVS:        1.40 cm   LV e' lateral:   12.40 cm/s LVOT diam:     1.90 cm   LV E/e' lateral: 5.5 LV SV:         26 LV SV Index:   13 LVOT Area:     2.84 cm  RIGHT VENTRICLE RV S prime:     14.10 cm/s TAPSE (M-mode): 1.4 cm LEFT ATRIUM           Index        RIGHT ATRIUM           Index LA diam:      5.00 cm 2.52 cm/m   RA Area:     28.00 cm LA Vol (A4C): 72.2 ml 36.41 ml/m  RA Volume:   99.30 ml  50.08 ml/m  AORTIC VALVE             PULMONIC VALVE LVOT Vmax:   51.70 cm/s  PR End Diast Vel: 2.29 msec LVOT Vmean:  31.500  cm/s LVOT VTI:    0.090 m  AORTA Ao Root diam: 2.90 cm Ao Asc diam:  3.60 cm MITRAL VALVE                 TRICUSPID VALVE MV Area (PHT): 3.72 cm      TR Peak grad:   12.5 mmHg MV Area VTI:   0.66 cm      TR Vmax:        177.00 cm/s MV Peak grad:  35.8 mmHg MV Mean grad:  2.0 mmHg      SHUNTS MV Vmax:       2.99 m/s      Systemic VTI:  0.09 m MV Vmean:      62.6 cm/s     Systemic Diam: 1.90 cm MV Decel Time: 204 msec MR Peak grad:   74.3 mmHg MR Mean grad:   50.0 mmHg MR Vmax:        431.00 cm/s MR Vmean:       330.0 cm/s MR PISA:        1.57 cm MR PISA Radius: 0.50 cm MV E velocity: 67.90 cm/s MV A velocity: 72.20 cm/s MV E/A ratio:  0.94 Nona Dell MD Electronically signed by Nona Dell MD Signature Date/Time: 07/03/2022/5:01:28 PM    Final     Scheduled Meds:  acyclovir  400 mg Oral TID   amLODipine  5 mg Oral  Daily   And   benazepril  40 mg Oral Daily   apixaban  5 mg Oral BID   atorvastatin  20 mg Oral QPM   calcium-vitamin D  1 tablet Oral Q breakfast   FLUoxetine  10 mg Oral Daily   furosemide  80 mg Intravenous Q12H   metoprolol tartrate  12.5 mg Oral BID   potassium chloride  20 mEq Oral Daily   sodium chloride flush  3 mL Intravenous Q12H   temazepam  30 mg Oral QHS   Continuous Infusions:  sodium chloride      LOS: 2 days   Time spent: 35 mins  Michaeal Davis Laural Benes, MD How to contact the [facility] Attending or Consulting provider 7A - 7P or covering provider during after hours 7P -7A, for this patient?  Check the care team in [facility] and look for a) attending/consulting TRH provider listed and b) the [facility] team listed Log into [email or web address] and use [place]'s universal password to access. If you do not have the password, please contact the hospital operator. Locate the [facility] provider you are looking for under [facility] and page to a number that you can be directly reached. If you still have difficulty reaching the provider, please page the [facility] (Director on Call) for the  Hospitalists listed on amion for assistance.  07/04/2022, 12:45 PM

## 2022-07-04 NOTE — Progress Notes (Addendum)
Tele called patient had a pause of 2.12 seconds. Vital signs: T-98.0, BP-117/78, P-50, R-18, O2-96% at Room air. MD Zierle-Ghosh made aware. Per MD hold Metoprolol dose at 1000. Will inform on-coming nurse.

## 2022-07-05 DIAGNOSIS — A6004 Herpesviral vulvovaginitis: Secondary | ICD-10-CM | POA: Diagnosis not present

## 2022-07-05 DIAGNOSIS — I50811 Acute right heart failure: Secondary | ICD-10-CM | POA: Diagnosis not present

## 2022-07-05 DIAGNOSIS — E8779 Other fluid overload: Secondary | ICD-10-CM | POA: Diagnosis not present

## 2022-07-05 DIAGNOSIS — I517 Cardiomegaly: Secondary | ICD-10-CM | POA: Diagnosis not present

## 2022-07-05 LAB — BASIC METABOLIC PANEL
Anion gap: 8 (ref 5–15)
BUN: 19 mg/dL (ref 8–23)
CO2: 27 mmol/L (ref 22–32)
Calcium: 9.3 mg/dL (ref 8.9–10.3)
Chloride: 103 mmol/L (ref 98–111)
Creatinine, Ser: 0.86 mg/dL (ref 0.44–1.00)
GFR, Estimated: >60 mL/min (ref >60)
Glucose, Bld: 101 mg/dL — ABNORMAL HIGH (ref 70–99)
Potassium: 4 mmol/L (ref 3.5–5.1)
Sodium: 138 mmol/L (ref 135–145)

## 2022-07-05 NOTE — Progress Notes (Signed)
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  She complained of DOE, two-pillow orthopnea and PND associated with abdominal distention and increasing lower extremity edema progressive over the past 2 weeks.  She was notably volume overloaded and also complained of ongoing nausea and intermittent dizziness.  Dr. Jenene Slicker was concerned about decompensated right heart failure and referred patient to ED.  She had recently been treated for UTI by her PCP.  Patient's urine culture tested negative.  Patient was given IV furosemide in the emergency department and already starting to have a diuresis.  Her cardiac BNP was elevated at 405.0.  She had trace bilateral pleural effusions and atelectasis on chest x-ray.  Admission was requested for further management with IV diuresis with furosemide.   Assessment and Plan:  Decompensated right heart failure / Acute HFpEF -admit to cardiac monitored bed -IV furosemide 80 mg twice daily, hold home oral torsemide -Low-sodium diet with fluid restriction -Monitor intake and output and daily weights closely -Monitor daily BMP -Resume potassium supplement 20 mEq oral twice daily -Inpatient cardiology services to evaluate 07/03/2022 -Obtain ReDs vest reading every morning  -recent TTE 01/24: LVEF 60-65%, indeterminate DD, severe TR -resumed home amlodipine/benazepril daily  -pt no longer on dapagliflozin,  possibly due to cost  -repeat limited TTE 07/04/22: LVEF 60-65%, findings of RV volume overload, severe TR, small pericardial effusion -weight is down to 85 kg   Intake/Output Summary (Last 24 hours) at 07/05/2022 1700 Last data filed at 07/05/2022 0531 Gross per 24 hour  Intake 240 ml  Output 700 ml  Net -460 ml   Filed Weights   07/03/22 0500 07/04/22 0414 07/05/22 0521  Weight: 86.6 kg 89.2 kg 84.9 kg   Persistent Atrial Fibrillation  -resumed home metoprolol tartrate 12.5 mg BID -resumed home apixaban 5 mg BID  -cardiology team considering ambulatory 2 week event monitoring    Nausea  - possibly secondary to recent course of antibiotics - supportive measures ordered - this seems to have resolved    Question of UTI  - recent urine culture was negative for clinically significant infection  - ruled out   Dyslipidemia -resume home statin therapy -fasting lipid panel reassuring with LDL of 38   HSV genital outbreak -resumed acyclovir therapy to complete 7 d course   Essential hypertension -monitor closely in setting of IV diuresis -resumed home amlodipine/benazepril    Depression/Anxiety -resumed home fluoxetine 10 mg daily    DVT prophylaxis: apixaban  Code Status: full  Family Communication:  Disposition: Status is: Inpatient Remains inpatient appropriate because: IV diuresis with furosemide    Consultants:  Cardio  Procedures:   Antimicrobials:    Subjective: Pt says that overall she is less swollen in her extremities.     Objective: Vitals:   07/04/22 2045 07/05/22 0521 07/05/22 0934 07/05/22 1458  BP: 119/68 109/67 109/70 100/62  Pulse: 66 67 69 (!) 54  Resp: 18 20 18 16   Temp: 98 F (36.7 C) 97.8 F (36.6 C)  97.6 F (36.4 C)  TempSrc:    Oral  SpO2: 97% 97% 95% 97%  Weight:  84.9 kg    Height:        Intake/Output Summary (Last 24 hours) at 07/05/2022 1700 Last data filed at 07/05/2022 0531 Gross per 24 hour  Intake 240 ml  Output 700 ml   Net -460 ml   Filed Weights   07/03/22 0500 07/04/22 0414 07/05/22 0521  Weight: 86.6 kg 89.2 kg 84.9 kg   Examination:  General exam: Appears calm and comfortable  Respiratory system: Clear to auscultation. Respiratory effort normal. Cardiovascular system: normal S1 & S2 heard. No JVD, murmurs, rubs, gallops or clicks. No pedal edema. Gastrointestinal system: Abdomen is nondistended, soft and nontender. No organomegaly or masses felt. Normal bowel sounds heard. Central nervous system: Alert and oriented. No focal neurological deficits. Extremities: 1+ pitting bilateral pretibial edema.  Symmetric 5 x 5 power. Skin: No rashes, lesions or ulcers. Psychiatry: Judgement and insight appear normal. Mood & affect appropriate.   Data Reviewed: I have personally reviewed following labs and imaging studies  CBC: Recent Labs  Lab 06/30/22 1533 07/02/22 1148  WBC 7.3 7.3  NEUTROABS 4.6  --   HGB 12.6 12.2  HCT 38.9 38.5  MCV 94.0 95.1  PLT 224 230    Basic Metabolic Panel: Recent Labs  Lab 06/30/22 1533 07/02/22 1148 07/03/22 0421 07/04/22 0554 07/05/22 0502  NA 136 137 137 137 138  K 3.9 3.6 4.1 4.0 4.0  CL 104 105 105 105 103  CO2 22 22 23 25 27   GLUCOSE 101* 97 103* 108* 101*  BUN 31* 22 23 22 19   CREATININE 1.20* 0.97 0.93 0.92 0.86  CALCIUM 9.2 9.0 8.9 9.0 9.3  MG  --   --  1.6* 2.0  --     CBG: No results for input(s): "GLUCAP" in the last 168 hours.  Recent Results (from the past 240 hour(s))  Urine Culture     Status: None   Collection Time: 06/30/22  2:30 PM   Specimen: Urine   UR  Result Value Ref Range Status   Urine Culture, Routine Final report  Final   Organism ID, Bacteria Comment  Final    Comment: Culture shows less than 10,000 colony forming units of bacteria per milliliter of urine. This colony count is not generally considered to be clinically significant.      Radiology Studies: No results found.  Scheduled Meds:  acyclovir  400 mg  Oral TID   amLODipine  5 mg Oral Daily   And   benazepril  40 mg Oral Daily   apixaban  5 mg Oral BID   atorvastatin  20 mg Oral QPM   calcium-vitamin D  1 tablet Oral Q breakfast   FLUoxetine  10 mg Oral Daily   furosemide  80 mg Intravenous Q12H   melatonin  6 mg Oral QHS   metoprolol tartrate  12.5 mg Oral BID   potassium chloride  20 mEq Oral Daily   sodium chloride flush  3 mL Intravenous Q12H   temazepam  30 mg Oral QHS   Continuous Infusions:  sodium chloride      LOS: 3 days   Time spent: 35 mins  Annel Zunker Laural Benes, MD How to contact the [facility] Attending or Consulting provider 7A - 7P or covering provider during after hours 7P -7A, for this patient?  Check the care team in [facility] and look for a) attending/consulting TRH provider listed and  b) the [facility] team listed Log into [email or web address] and use [place]'s universal password to access. If you do not have the password, please contact the hospital operator. Locate the [facility] provider you are looking for under [facility] and page to a number that you can be directly reached. If you still have difficulty reaching the provider, please page the [facility] (Director on Call) for the Hospitalists listed on amion for assistance.  07/05/2022, 5:00 PM

## 2022-07-06 DIAGNOSIS — I351 Nonrheumatic aortic (valve) insufficiency: Secondary | ICD-10-CM

## 2022-07-06 DIAGNOSIS — R0602 Shortness of breath: Secondary | ICD-10-CM

## 2022-07-06 DIAGNOSIS — I50811 Acute right heart failure: Secondary | ICD-10-CM | POA: Diagnosis not present

## 2022-07-06 DIAGNOSIS — I5033 Acute on chronic diastolic (congestive) heart failure: Secondary | ICD-10-CM | POA: Diagnosis not present

## 2022-07-06 LAB — BASIC METABOLIC PANEL
Anion gap: 9 (ref 5–15)
BUN: 21 mg/dL (ref 8–23)
CO2: 26 mmol/L (ref 22–32)
Calcium: 9.3 mg/dL (ref 8.9–10.3)
Chloride: 102 mmol/L (ref 98–111)
Creatinine, Ser: 0.96 mg/dL (ref 0.44–1.00)
GFR, Estimated: >60 mL/min (ref >60)
Glucose, Bld: 113 mg/dL — ABNORMAL HIGH (ref 70–99)
Potassium: 3.8 mmol/L (ref 3.5–5.1)
Sodium: 137 mmol/L (ref 135–145)

## 2022-07-06 MED ORDER — FUROSEMIDE 40 MG PO TABS
60.0000 mg | ORAL_TABLET | Freq: Two times a day (BID) | ORAL | Status: DC
  Administered 2022-07-06 – 2022-07-07 (×2): 60 mg via ORAL
  Filled 2022-07-06 (×2): qty 1

## 2022-07-06 NOTE — Progress Notes (Signed)
Progress Note  Patient Name: Theresa Garcia Date of Encounter: 07/06/2022  Primary Cardiologist: Marjo Bicker, MD  Subjective   No acute events overnight. Patient reported significant improvement in her symptoms since the admit day.  Inpatient Medications    Scheduled Meds:  acyclovir  400 mg Oral TID   amLODipine  5 mg Oral Daily   And   benazepril  40 mg Oral Daily   apixaban  5 mg Oral BID   atorvastatin  20 mg Oral QPM   calcium-vitamin D  1 tablet Oral Q breakfast   FLUoxetine  10 mg Oral Daily   furosemide  80 mg Intravenous Q12H   melatonin  6 mg Oral QHS   metoprolol tartrate  12.5 mg Oral BID   potassium chloride  20 mEq Oral Daily   sodium chloride flush  3 mL Intravenous Q12H   temazepam  30 mg Oral QHS   Continuous Infusions:  sodium chloride     PRN Meds: sodium chloride, acetaminophen, meclizine, ondansetron (ZOFRAN) IV, sodium chloride flush   Vital Signs    Vitals:   07/05/22 1458 07/05/22 2007 07/06/22 0431 07/06/22 0836  BP: 100/62 114/71 112/71   Pulse: (!) 54 62  77  Resp: 16     Temp: 97.6 F (36.4 C) 98.4 F (36.9 C) 97.8 F (36.6 C)   TempSrc: Oral Oral Oral   SpO2: 97% 98% 97%   Weight:   88.3 kg   Height:        Intake/Output Summary (Last 24 hours) at 07/06/2022 1009 Last data filed at 07/06/2022 0431 Gross per 24 hour  Intake --  Output 300 ml  Net -300 ml   Filed Weights   07/04/22 0414 07/05/22 0521 07/06/22 0431  Weight: 89.2 kg 84.9 kg 88.3 kg    Telemetry     Personally reviewed, atrial fibrillation, rate controlled, 40-50s  ECG    Atrial fibrillation  Physical Exam   GEN: No acute distress.   Neck: No JVD. Cardiac: RRR, no murmur, rub, or gallop.  Respiratory: Nonlabored. Clear to auscultation bilaterally. GI: Soft, nontender, bowel sounds present. MS: No edema; No deformity. Neuro:  Nonfocal. Psych: Alert and oriented x 3. Normal affect.  Labs    Chemistry Recent Labs  Lab  07/04/22 0554 07/05/22 0502 07/06/22 0511  NA 137 138 137  K 4.0 4.0 3.8  CL 105 103 102  CO2 25 27 26   GLUCOSE 108* 101* 113*  BUN 22 19 21   CREATININE 0.92 0.86 0.96  CALCIUM 9.0 9.3 9.3  GFRNONAA >60 >60 >60  ANIONGAP 7 8 9      Hematology Recent Labs  Lab 06/30/22 1533 07/02/22 1148  WBC 7.3 7.3  RBC 4.14 4.05  HGB 12.6 12.2  HCT 38.9 38.5  MCV 94.0 95.1  MCH 30.4 30.1  MCHC 32.4 31.7  RDW 16.1* 16.5*  PLT 224 230    Cardiac EnzymesNo results for input(s): "TROPONINIHS" in the last 720 hours.  BNP Recent Labs  Lab 07/02/22 1148 07/03/22 0421  BNP 405.0* 519.0*     DDimerNo results for input(s): "DDIMER" in the last 168 hours.   Radiology    No results found.   Assessment & Plan   # Acute on chronic HFpEF, compensated: Switch IV Lasix to p.o. Lasix 60 mg twice daily.  # Severe TR due to annular dilatation with moderate RV enlargement and moderate RV systolic dysfunction: Outpatient structural cardiology follow-up (referral already placed) for therapeutic options  of severe TR.  # Persistent A-fib, CHA2DS2-VASc score of 4-5: Telemetry reviewed, in A-fib, HR 40-50s.  # Mild aortic valve regurgitation: Outpatient echo surveillance.  I have spent a total of 30 minutes with patient reviewing chart , telemetry, EKGs, labs and examining patient as well as establishing an assessment and plan that was discussed with the patient.  > 50% of time was spent in direct patient care.     Signed, Kala Gassmann Norton Pastel, MD  07/06/2022, 10:09 AM

## 2022-07-06 NOTE — Progress Notes (Signed)
PROGRESS NOTE   Theresa Garcia  ZOX:[id number] DOB: 01/13/1941 DOA: 07/02/2022 PCP: Kerri Perches, MD   Chief Complaint  Patient presents with   Leg Swelling   Level of care: Telemetry  Brief Admission History:  82 year old female retired from [employer] plant in [place] after 32 years with atrial fibrillation, severe atrial functional TR with moderate RV enlargement, hypertension, osteoporosis, hyperlipidemia, HFpEF, history of breast cancer treated with chemo and radiation, GERD, chronic back pain, nephrolithiasis who was sent to the emergency department from cardiology office visit earlier today.  She complained of DOE, two-pillow orthopnea and PND associated with abdominal distention and increasing lower extremity edema progressive over the past 2 weeks.  She was notably volume overloaded and also complained of ongoing nausea and intermittent dizziness.  Dr. Jenene Slicker was concerned about decompensated right heart failure and referred patient to ED.  She had recently been treated for UTI by her PCP.  Patient's urine culture tested negative.  Patient was given IV furosemide in the emergency department and already starting to have a diuresis.  Her cardiac BNP was elevated at 405.0.  She had trace bilateral pleural effusions and atelectasis on chest x-ray.  Admission was requested for further management with IV diuresis with furosemide.   Assessment and Plan:  Decompensated right heart failure / Acute HFpEF -admit to cardiac monitored bed -initially treated with IV furosemide 80 mg twice daily, cardio team changed to oral furosemide 60 mg BID 5/13 -Low-sodium diet with fluid restriction -Monitor intake and output and daily weights closely -Monitor daily BMP -Resume potassium supplement 20 mEq oral twice daily -Inpatient cardiology services to evaluate 07/03/2022 -Obtain ReDs vest reading every morning  -recent TTE 01/24: LVEF 60-65%, indeterminate DD, severe TR -resumed home  amlodipine/benazepril daily  -pt no longer on dapagliflozin, possibly due to cost  -repeat limited TTE 07/04/22: LVEF 60-65%, findings of RV volume overload, severe TR, small pericardial effusion -weight is down to 85 kg   Intake/Output Summary (Last 24 hours) at 07/06/2022 1715 Last data filed at 07/06/2022 1300 Gross per 24 hour  Intake 480 ml  Output 300 ml  Net 180 ml   Filed Weights   07/04/22 0414 07/05/22 0521 07/06/22 0431  Weight: 89.2 kg 84.9 kg 88.3 kg   Persistent Atrial Fibrillation  -resumed home metoprolol tartrate 12.5 mg BID -resumed home apixaban 5 mg BID  -cardiology team considering ambulatory 2 week event monitoring    Nausea  - possibly secondary to recent course of antibiotics - supportive measures ordered - this seems to have resolved    Question of UTI  - recent urine culture was negative for clinically significant infection  - ruled out   Dyslipidemia -resume home statin therapy -fasting lipid panel reassuring with LDL of 38   HSV genital outbreak -resumed acyclovir therapy to complete 7 d course   Essential hypertension -monitor closely in setting of IV diuresis -resumed home amlodipine/benazepril    Depression/Anxiety -resumed home fluoxetine 10 mg daily    DVT prophylaxis: apixaban  Code Status: full  Family Communication:  Disposition: Status is: Inpatient Remains inpatient appropriate because: IV diuresis with furosemide    Consultants:  Cardio  Procedures:   Antimicrobials:    Subjective: Less SOB and edema today     Objective: Vitals:   07/06/22 0431 07/06/22 0836 07/06/22 1402 07/06/22 1412  BP: 112/71  (!) 90/57 103/65  Pulse:  77 (!) 45 (!) 50  Resp:   20   Temp: 97.8 F (36.6  C)  97.8 F (36.6 C)   TempSrc: Oral  Oral   SpO2: 97%  99%   Weight: 88.3 kg     Height:        Intake/Output Summary (Last 24 hours) at 07/06/2022 1715 Last data filed at 07/06/2022 1300 Gross per 24 hour  Intake 480 ml  Output 300 ml   Net 180 ml   Filed Weights   07/04/22 0414 07/05/22 0521 07/06/22 0431  Weight: 89.2 kg 84.9 kg 88.3 kg   Examination:  General exam: Appears calm and comfortable  Respiratory system: Clear to auscultation. Respiratory effort normal. Cardiovascular system: normal S1 & S2 heard. No JVD, murmurs, rubs, gallops or clicks. No pedal edema. Gastrointestinal system: Abdomen is nondistended, soft and nontender. No organomegaly or masses felt. Normal bowel sounds heard. Central nervous system: Alert and oriented. No focal neurological deficits. Extremities: 1+ pitting bilateral pretibial edema.  Symmetric 5 x 5 power. Skin: No rashes, lesions or ulcers. Psychiatry: Judgement and insight appear normal. Mood & affect appropriate.   Data Reviewed: I have personally reviewed following labs and imaging studies  CBC: Recent Labs  Lab 06/30/22 1533 07/02/22 1148  WBC 7.3 7.3  NEUTROABS 4.6  --   HGB 12.6 12.2  HCT 38.9 38.5  MCV 94.0 95.1  PLT 224 230    Basic Metabolic Panel: Recent Labs  Lab 07/02/22 1148 07/03/22 0421 07/04/22 0554 07/05/22 0502 07/06/22 0511  NA 137 137 137 138 137  K 3.6 4.1 4.0 4.0 3.8  CL 105 105 105 103 102  CO2 22 23 25 27 26   GLUCOSE 97 103* 108* 101* 113*  BUN 22 23 22 19 21   CREATININE 0.97 0.93 0.92 0.86 0.96  CALCIUM 9.0 8.9 9.0 9.3 9.3  MG  --  1.6* 2.0  --   --     CBG: No results for input(s): "GLUCAP" in the last 168 hours.  Recent Results (from the past 240 hour(s))  Urine Culture     Status: None   Collection Time: 06/30/22  2:30 PM   Specimen: Urine   UR  Result Value Ref Range Status   Urine Culture, Routine Final report  Final   Organism ID, Bacteria Comment  Final    Comment: Culture shows less than 10,000 colony forming units of bacteria per milliliter of urine. This colony count is not generally considered to be clinically significant.      Radiology Studies: No results found.  Scheduled Meds:  acyclovir  400 mg Oral  TID   amLODipine  5 mg Oral Daily   And   benazepril  40 mg Oral Daily   apixaban  5 mg Oral BID   atorvastatin  20 mg Oral QPM   calcium-vitamin D  1 tablet Oral Q breakfast   FLUoxetine  10 mg Oral Daily   furosemide  60 mg Oral BID   melatonin  6 mg Oral QHS   metoprolol tartrate  12.5 mg Oral BID   potassium chloride  20 mEq Oral Daily   sodium chloride flush  3 mL Intravenous Q12H   temazepam  30 mg Oral QHS   Continuous Infusions:  sodium chloride      LOS: 4 days   Time spent: 35 mins  Yamina Lenis Laural Benes, MD How to contact the [facility] Attending or Consulting provider 7A - 7P or covering provider during after hours 7P -7A, for this patient?  Check the care team in [facility] and look for a) attending/consulting [facility]  provider listed and b) the [facility] team listed Log into [email or web address] and use [place]'s universal password to access. If you do not have the password, please contact the hospital operator. Locate the [facility] provider you are looking for under [facility] and page to a number that you can be directly reached. If you still have difficulty reaching the provider, please page the [facility] (Director on Call) for the Hospitalists listed on amion for assistance.  07/06/2022, 5:15 PM

## 2022-07-06 NOTE — Progress Notes (Signed)
   07/06/22 1100  ReDS Vest / Clip  Station Marker C  Ruler Value 30  ReDS Value Range < 36  ReDS Actual Value 21

## 2022-07-07 DIAGNOSIS — I5033 Acute on chronic diastolic (congestive) heart failure: Secondary | ICD-10-CM | POA: Diagnosis not present

## 2022-07-07 DIAGNOSIS — I4819 Other persistent atrial fibrillation: Secondary | ICD-10-CM | POA: Diagnosis not present

## 2022-07-07 DIAGNOSIS — I50811 Acute right heart failure: Secondary | ICD-10-CM | POA: Diagnosis not present

## 2022-07-07 DIAGNOSIS — I071 Rheumatic tricuspid insufficiency: Secondary | ICD-10-CM | POA: Diagnosis not present

## 2022-07-07 DIAGNOSIS — K219 Gastro-esophageal reflux disease without esophagitis: Secondary | ICD-10-CM

## 2022-07-07 LAB — BASIC METABOLIC PANEL
Anion gap: 10 (ref 5–15)
BUN: 23 mg/dL (ref 8–23)
CO2: 25 mmol/L (ref 22–32)
Calcium: 9.4 mg/dL (ref 8.9–10.3)
Chloride: 101 mmol/L (ref 98–111)
Creatinine, Ser: 1.15 mg/dL — ABNORMAL HIGH (ref 0.44–1.00)
GFR, Estimated: 48 mL/min — ABNORMAL LOW (ref >60)
Glucose, Bld: 106 mg/dL — ABNORMAL HIGH (ref 70–99)
Potassium: 4.1 mmol/L (ref 3.5–5.1)
Sodium: 136 mmol/L (ref 135–145)

## 2022-07-07 MED ORDER — ACYCLOVIR 400 MG PO TABS: 400.0000 mg | ORAL_TABLET | Freq: Three times a day (TID) | ORAL | 0 refills | Status: AC

## 2022-07-07 MED ORDER — FUROSEMIDE 20 MG PO TABS: 60.0000 mg | ORAL_TABLET | Freq: Two times a day (BID) | ORAL | 1 refills | Status: DC

## 2022-07-07 MED ORDER — POTASSIUM CHLORIDE CRYS ER 10 MEQ PO TBCR: 10.0000 meq | EXTENDED_RELEASE_TABLET | ORAL | 1 refills | Status: DC

## 2022-07-07 NOTE — Progress Notes (Signed)
Progress Note  Patient Name: Theresa Garcia Date of Encounter: 07/07/2022  Primary Cardiologist: Marjo Bicker, MD  Subjective   No acute events overnight. Patient reported significant improvement in her symptoms since the admit day.  Inpatient Medications    Scheduled Meds:  acyclovir  400 mg Oral TID   amLODipine  5 mg Oral Daily   And   benazepril  40 mg Oral Daily   apixaban  5 mg Oral BID   atorvastatin  20 mg Oral QPM   calcium-vitamin D  1 tablet Oral Q breakfast   FLUoxetine  10 mg Oral Daily   furosemide  60 mg Oral BID   melatonin  6 mg Oral QHS   metoprolol tartrate  12.5 mg Oral BID   potassium chloride  20 mEq Oral Daily   sodium chloride flush  3 mL Intravenous Q12H   temazepam  30 mg Oral QHS   Continuous Infusions:  sodium chloride     PRN Meds: sodium chloride, acetaminophen, meclizine, ondansetron (ZOFRAN) IV, sodium chloride flush   Vital Signs    Vitals:   07/06/22 1412 07/06/22 2034 07/07/22 0347 07/07/22 0900  BP: 103/65 (!) 91/58 110/78 100/68  Pulse: (!) 50 (!) 59 (!) 58 62  Resp:  20 20   Temp:  98 F (36.7 C) 98 F (36.7 C)   TempSrc:  Oral Oral   SpO2:  97% 100% 98%  Weight:   85.6 kg   Height:        Intake/Output Summary (Last 24 hours) at 07/07/2022 1001 Last data filed at 07/07/2022 0914 Gross per 24 hour  Intake 480 ml  Output --  Net 480 ml   Filed Weights   07/05/22 0521 07/06/22 0431 07/07/22 0347  Weight: 84.9 kg 88.3 kg 85.6 kg    Telemetry     Personally reviewed, atrial fibrillation, rate controlled, 50-60s  ECG    Atrial fibrillation  Physical Exam   GEN: No acute distress.   Neck: No JVD. Cardiac: RRR, no murmur, rub, or gallop.  Respiratory: Nonlabored. Clear to auscultation bilaterally. GI: Soft, nontender, bowel sounds present. MS: No edema; No deformity. Neuro:  Nonfocal. Psych: Alert and oriented x 3. Normal affect.  Labs    Chemistry Recent Labs  Lab 07/05/22 0502  07/06/22 0511 07/07/22 0416  NA 138 137 136  K 4.0 3.8 4.1  CL 103 102 101  CO2 27 26 25   GLUCOSE 101* 113* 106*  BUN 19 21 23   CREATININE 0.86 0.96 1.15*  CALCIUM 9.3 9.3 9.4  GFRNONAA >60 >60 48*  ANIONGAP 8 9 10      Hematology Recent Labs  Lab 06/30/22 1533 07/02/22 1148  WBC 7.3 7.3  RBC 4.14 4.05  HGB 12.6 12.2  HCT 38.9 38.5  MCV 94.0 95.1  MCH 30.4 30.1  MCHC 32.4 31.7  RDW 16.1* 16.5*  PLT 224 230    Cardiac EnzymesNo results for input(s): "TROPONINIHS" in the last 720 hours.  BNP Recent Labs  Lab 07/02/22 1148 07/03/22 0421  BNP 405.0* 519.0*     DDimerNo results for input(s): "DDIMER" in the last 168 hours.   Radiology    No results found.   Assessment & Plan   # Acute on chronic HFpEF, compensated: Continue p.o. Lasix 60 mg twice daily  # Severe TR due to annular dilatation with moderate RV enlargement and moderate RV systolic dysfunction: Outpatient structural cardiology follow-up (referral already placed) for therapeutic options of severe TR.  #  Persistent A-fib, CHA2DS2-VASc score of 4-5: Telemetry reviewed, in A-fib, HR 50-60s, continue metoprolol tartrate 12.5 mg twice daily and Eliquis 5 mg twice daily.  2-week event monitor upon discharge as she complained of dizziness at home and telemetry reviewed atrial fibrillation with slow ventriclularresponse.  # Mild aortic valve regurgitation: Outpatient echo surveillance   [facility] will sign off.   Medication Recommendations: P.o. Lasix 60 mg twice daily, metoprolol tartrate 12.5 mg twice daily, Eliquis 5 mg twice daily. Other recommendations (labs, testing, etc): 2-week event monitor upon discharge Follow up as an outpatient: General cardiology outpatient follow-up in 1 month (APP/MD) and a referral was already placed to structural heart clinic in [place].   I have spent a total of 30 minutes with patient reviewing chart , telemetry, EKGs, labs and examining patient as well as  establishing an assessment and plan that was discussed with the patient.  > 50% of time was spent in direct patient care.     Signed, Marjo Bicker, MD  07/07/2022, 10:01 AM

## 2022-07-07 NOTE — Care Management Important Message (Signed)
Important Message  Patient Details Called Patient to verbally deliver IM Letter. Name: Theresa Garcia MRN: [id number] Date of Birth: 01-05-1941   Medicare Important Message Given:  Yes     Caren Macadam 07/07/2022, 12:34 PM

## 2022-07-07 NOTE — Discharge Instructions (Signed)
IMPORTANT INFORMATION: PAY CLOSE ATTENTION   PHYSICIAN DISCHARGE INSTRUCTIONS  Follow with Primary care provider  Simpson, Margaret E, MD  and other consultants as instructed by your Hospitalist Physician  SEEK MEDICAL CARE OR RETURN TO EMERGENCY ROOM IF SYMPTOMS COME BACK, WORSEN OR NEW PROBLEM DEVELOPS   Please note: You were cared for by a hospitalist during your hospital stay. Every effort will be made to forward records to your primary care provider.  You can request that your primary care provider send for your hospital records if they have not received them.  Once you are discharged, your primary care physician will handle any further medical issues. Please note that NO REFILLS for any discharge medications will be authorized once you are discharged, as it is imperative that you return to your primary care physician (or establish a relationship with a primary care physician if you do not have one) for your post hospital discharge needs so that they can reassess your need for medications and monitor your lab values.  Please get a complete blood count and chemistry panel checked by your Primary MD at your next visit, and again as instructed by your Primary MD.  Get Medicines reviewed and adjusted: Please take all your medications with you for your next visit with your Primary MD  Laboratory/radiological data: Please request your Primary MD to go over all hospital tests and procedure/radiological results at the follow up, please ask your primary care provider to get all Hospital records sent to his/her office.  In some cases, they will be blood work, cultures and biopsy results pending at the time of your discharge. Please request that your primary care provider follow up on these results.  If you are diabetic, please bring your blood sugar readings with you to your follow up appointment with primary care.    Please call and make your follow up appointments as soon as possible.    Also  Note the following: If you experience worsening of your admission symptoms, develop shortness of breath, life threatening emergency, suicidal or homicidal thoughts you must seek medical attention immediately by calling 911 or calling your MD immediately  if symptoms less severe.  You must read complete instructions/literature along with all the possible adverse reactions/side effects for all the Medicines you take and that have been prescribed to you. Take any new Medicines after you have completely understood and accpet all the possible adverse reactions/side effects.   Do not drive when taking Pain medications or sleeping medications (Benzodiazepines)  Do not take more than prescribed Pain, Sleep and Anxiety Medications. It is not advisable to combine anxiety,sleep and pain medications without talking with your primary care practitioner  Special Instructions: If you have smoked or chewed Tobacco  in the last 2 yrs please stop smoking, stop any regular Alcohol  and or any Recreational drug use.  Wear Seat belts while driving.  Do not drive if taking any narcotic, mind altering or controlled substances or recreational drugs or alcohol.       

## 2022-07-07 NOTE — Discharge Summary (Signed)
Physician Discharge Summary  Theresa Garcia XBJ:[id number] DOB: Jan 29, 1941 DOA: 07/02/2022  PCP: Kerri Perches, MD Cardiology: [facility] Admit date: 07/02/2022 Discharge date: 07/07/2022  Admitted From:  Home  Disposition: Home   Recommendations for Outpatient Follow-up:  Follow up with PCP in 1 weeks for labs and weight check Follow up with cardiology on 08/03/22 as scheduled  Please obtain BMP/CBC in one week to check renal function, electrolytes 2 week cardiac monitor arranged thru [facility] office   Discharge Condition: STABLE   CODE STATUS: FULL DIET: low sodium fluid restriction    Brief Hospitalization Summary: Please see all hospital notes, images, labs for full details of the hospitalization. ADMISSION PROVIDER HPI:  82 year old female retired from [employer] plant in [place] after 32 years with atrial fibrillation, severe atrial functional TR with moderate RV enlargement, hypertension, osteoporosis, hyperlipidemia, HFpEF, history of breast cancer treated with chemo and radiation, GERD, chronic back pain, nephrolithiasis who was sent to the emergency department from cardiology office visit earlier today. She complained of DOE, two-pillow orthopnea and PND associated with abdominal distention and increasing lower extremity edema progressive over the past 2 weeks. She was notably volume overloaded and also complained of ongoing nausea and intermittent dizziness. Dr. Jenene Slicker was concerned about decompensated right heart failure and referred patient to ED. She had recently been treated for UTI by her PCP. Patient's urine culture tested negative. Patient was given IV furosemide in the emergency department and already starting to have a diuresis. Her cardiac BNP was elevated at 405.0. She had trace bilateral pleural effusions and atelectasis on chest x-ray. Admission was requested for further management with IV diuresis with furosemide.   HOSPITAL COURSE BY PROBLEM  LIST  Decompensated right heart failure / Acute HFpEF -admitted to cardiac monitored bed -initially treated with IV furosemide 80 mg twice daily, cardio team changed to oral furosemide 60 mg BID 5/13 -cardiology signed off 5/14, DC on furosemide 60 mg BID, recommended 2 week event monitor -I sent message request to [facility] [place] office [employer]. [employer] to request a 2 week event monitor for patient prior to discharge  -Low-sodium diet with fluid restriction -Monitor intake and output and daily weights closely -Monitored daily BMP in hospital, recommend repeat BMP in 1 week with PCP -potassium supplement 10 mEq oral every other day -Inpatient cardiology services consulted in hospital -Obtain ReDs vest reading every morning - reassuring at 21.  -recent TTE 01/24: LVEF 60-65%, indeterminate DD, severe TR -resumed home amlodipine/benazepril daily  -pt no longer on dapagliflozin, possibly due to cost  -repeat limited TTE 07/04/22: LVEF 60-65%, findings of RV volume overload, severe TR, small pericardial effusion -weight is down to 85 kg     Filed Weights   07/05/22 0521 07/06/22 0431 07/07/22 0347  Weight: 84.9 kg 88.3 kg 85.6 kg   Persistent Atrial Fibrillation  -resumed home metoprolol tartrate 12.5 mg BID -resumed home apixaban 5 mg BID  -cardiology team recommended 2 week event monitoring requested thru [facility] [place] office on 07/07/22   Nausea  - possibly secondary to recent course of antibiotics - supportive measures ordered - this seems to have resolved    Question of UTI  - recent urine culture was negative for clinically significant infection  - ruled out    Dyslipidemia -resume home statin therapy -fasting lipid panel reassuring with LDL of 38   HSV genital outbreak -resumed acyclovir therapy to complete 7 d course    Essential hypertension -monitor closely in setting of IV diuresis -  resumed home amlodipine/benazepril    Depression/Anxiety -resumed home fluoxetine 10 mg  daily   Discharge Diagnoses:  Principal Problem:   Acute right heart failure (HCC) Active Problems:   History of breast cancer in female   Hyperlipidemia   Essential hypertension   Insomnia   Type 2 HSV infection of vulvovaginal region   Depression with anxiety   GERD (gastroesophageal reflux disease)   Unsteady gait   Obesity (BMI 30.0-34.9)   Volume overload   Atrial fibrillation (HCC)   Hx of pulmonary embolus   Severe tricuspid regurgitation   (HFpEF) heart failure with preserved ejection fraction (HCC)   Nausea   Shortness of breath   Right heart enlargement   Discharge Instructions:  Allergies as of 07/07/2022       Reactions   Meclizine Diarrhea   Penicillins Rash, Other (See Comments)   Has patient had a PCN reaction causing immediate rash, facial/tongue/throat swelling, SOB or lightheadedness with hypotension: no Has patient had a PCN reaction causing severe rash involving mucus membranes or skin necrosis: No Has patient had a PCN reaction that required hospitalization No Has patient had a PCN reaction occurring within the last 10 years: No If all of the above answers are "NO", then may proceed with Cephalosporin use.        Medication List     STOP taking these medications    dapagliflozin propanediol 10 MG Tabs tablet Commonly known as: Farxiga   Torsemide 40 MG Tabs       TAKE these medications    acetaminophen 500 MG tablet Commonly known as: TYLENOL Take 1,000 mg by mouth every 6 (six) hours as needed for mild pain or moderate pain.   acyclovir 400 MG tablet Commonly known as: ZOVIRAX Take 1 tablet (400 mg total) by mouth 3 (three) times daily for 2 doses. What changed: when to take this   alendronate 70 MG tablet Commonly known as: FOSAMAX TAKE 1 TABLET BY MOUTH EVERY 7  DAYS WITH A FULL GLASS OF WATER  ON AN EMPTY STOMACH What changed: See the new instructions.   amLODipine-benazepril 5-40 MG capsule Commonly known as: LOTREL Take  1 capsule by mouth daily.   ascorbic acid 500 MG tablet Commonly known as: VITAMIN C Take 500 mg by mouth daily.   atorvastatin 20 MG tablet Commonly known as: LIPITOR TAKE 1 TABLET BY MOUTH IN THE  EVENING   Caltrate 600+D3 600-20 MG-MCG Tabs Generic drug: Calcium Carb-Cholecalciferol Take 1 tablet by mouth daily. Take one tablet by mouth one time daily   Elastic Bandages & Supports Misc 1 Package by Does not apply route as directed.   Eliquis 5 MG Tabs tablet Generic drug: apixaban TAKE 1 TABLET BY MOUTH TWICE  DAILY   FLUoxetine 10 MG capsule Commonly known as: PROZAC TAKE 1 CAPSULE BY MOUTH DAILY   furosemide 20 MG tablet Commonly known as: LASIX Take 3 tablets (60 mg total) by mouth 2 (two) times daily. What changed:  how much to take when to take this   meclizine 25 MG tablet Commonly known as: ANTIVERT TAKE 1 TABLET BY MOUTH THREE TIMES DAILY AS NEEDED FOR DIZZINESS What changed: See the new instructions.   metoprolol tartrate 25 MG tablet Commonly known as: LOPRESSOR Take 12.5 mg by mouth 2 (two) times daily.   potassium chloride 10 MEQ tablet Commonly known as: KLOR-CON M Take 1 tablet (10 mEq total) by mouth every other day. Start taking on: Jul 08, 2022 What changed:  medication strength how much to take when to take this   temazepam 30 MG capsule Commonly known as: RESTORIL TAKE 1 CAPSULE BY MOUTH EVERY  NIGHT AT BEDTIME   Toilet Safety Frame Misc 1 each by Does not apply route daily as needed.   Vitamin D (Ergocalciferol) 1.25 MG (50000 UNIT) Caps capsule Commonly known as: DRISDOL TAKE 1 CAPSULE BY MOUTH ONCE  WEEKLY        Follow-up Information     Sharlene Dory, NP Follow up on 08/03/2022.   Specialty: Cardiology Why: Cardiology Hospital Follow-up on 08/03/2022 at 3:30 PM. Contact information: [address] [place] [address] [phone]                Allergies  Allergen Reactions   Meclizine Diarrhea    Penicillins Rash and Other (See Comments)    Has patient had a PCN reaction causing immediate rash, facial/tongue/throat swelling, SOB or lightheadedness with hypotension: no Has patient had a PCN reaction causing severe rash involving mucus membranes or skin necrosis: No Has patient had a PCN reaction that required hospitalization No Has patient had a PCN reaction occurring within the last 10 years: No If all of the above answers are "NO", then may proceed with Cephalosporin use.    Allergies as of 07/07/2022       Reactions   Meclizine Diarrhea   Penicillins Rash, Other (See Comments)   Has patient had a PCN reaction causing immediate rash, facial/tongue/throat swelling, SOB or lightheadedness with hypotension: no Has patient had a PCN reaction causing severe rash involving mucus membranes or skin necrosis: No Has patient had a PCN reaction that required hospitalization No Has patient had a PCN reaction occurring within the last 10 years: No If all of the above answers are "NO", then may proceed with Cephalosporin use.        Medication List     STOP taking these medications    dapagliflozin propanediol 10 MG Tabs tablet Commonly known as: Farxiga   Torsemide 40 MG Tabs       TAKE these medications    acetaminophen 500 MG tablet Commonly known as: TYLENOL Take 1,000 mg by mouth every 6 (six) hours as needed for mild pain or moderate pain.   acyclovir 400 MG tablet Commonly known as: ZOVIRAX Take 1 tablet (400 mg total) by mouth 3 (three) times daily for 2 doses. What changed: when to take this   alendronate 70 MG tablet Commonly known as: FOSAMAX TAKE 1 TABLET BY MOUTH EVERY 7  DAYS WITH A FULL GLASS OF WATER  ON AN EMPTY STOMACH What changed: See the new instructions.   amLODipine-benazepril 5-40 MG capsule Commonly known as: LOTREL Take 1 capsule by mouth daily.   ascorbic acid 500 MG tablet Commonly known as: VITAMIN C Take 500 mg by mouth daily.    atorvastatin 20 MG tablet Commonly known as: LIPITOR TAKE 1 TABLET BY MOUTH IN THE  EVENING   Caltrate 600+D3 600-20 MG-MCG Tabs Generic drug: Calcium Carb-Cholecalciferol Take 1 tablet by mouth daily. Take one tablet by mouth one time daily   Elastic Bandages & Supports Misc 1 Package by Does not apply route as directed.   Eliquis 5 MG Tabs tablet Generic drug: apixaban TAKE 1 TABLET BY MOUTH TWICE  DAILY   FLUoxetine 10 MG capsule Commonly known as: PROZAC TAKE 1 CAPSULE BY MOUTH DAILY   furosemide 20 MG tablet Commonly known as: LASIX Take 3 tablets (60 mg  total) by mouth 2 (two) times daily. What changed:  how much to take when to take this   meclizine 25 MG tablet Commonly known as: ANTIVERT TAKE 1 TABLET BY MOUTH THREE TIMES DAILY AS NEEDED FOR DIZZINESS What changed: See the new instructions.   metoprolol tartrate 25 MG tablet Commonly known as: LOPRESSOR Take 12.5 mg by mouth 2 (two) times daily.   potassium chloride 10 MEQ tablet Commonly known as: KLOR-CON M Take 1 tablet (10 mEq total) by mouth every other day. Start taking on: Jul 08, 2022 What changed:  medication strength how much to take when to take this   temazepam 30 MG capsule Commonly known as: RESTORIL TAKE 1 CAPSULE BY MOUTH EVERY  NIGHT AT BEDTIME   Toilet Safety Frame Misc 1 each by Does not apply route daily as needed.   Vitamin D (Ergocalciferol) 1.25 MG (50000 UNIT) Caps capsule Commonly known as: DRISDOL TAKE 1 CAPSULE BY MOUTH ONCE  WEEKLY        Procedures/Studies: ECHOCARDIOGRAM COMPLETE  Result Date: 07/03/2022    ECHOCARDIOGRAM REPORT   Patient Name:   RANYLA DEYO Date of Exam: 07/03/2022 Medical Rec #:  [id number]        Height:       67.0 in Accession #:    [id number]       Weight:       190.9 lb Date of Birth:  1941-01-16       BSA:          1.983 m Patient Age:    44 years         BP:           95/66 mmHg Patient Gender: F                HR:           67 bpm.  [facility] Procedure: 2D Echo, Cardiac Doppler and Color Doppler Indications:    Tricuspid Valve Disease I07.9                 CHF-Acute Diastolic I50.31  History:        Patient has prior history of Echocardiogram examinations, most                 recent 01/28/2022. CHF and Right heart enlargement, History of                 breast cancer, TV Disease, Arrythmias:Atrial Fibrillation,                 Signs/Symptoms:Shortness of Breath; Risk Factors:Hypertension,                 Former Smoker and Dyslipidemia.  Sonographer:    Aron Baba Referring Phys: [id number] SAMUEL G MCDOWELL  Sonographer Comments: Image acquisition challenging due to patient body habitus and Image acquisition challenging due to respiratory motion. IMPRESSIONS  1. Left ventricular ejection fraction, by estimation, is 60 to 65%. The left ventricle has normal function. The left ventricle has no regional wall motion abnormalities. There is moderate concentric left ventricular hypertrophy. Left ventricular diastolic parameters are indeterminate. There is the interventricular septum is flattened in diastole ('D' shaped left ventricle), consistent with right ventricular volume overload.  2. Right ventricular systolic function is moderately reduced. The right ventricular size is moderately enlarged. There is normal pulmonary artery systolic pressure. The estimated right ventricular systolic pressure is 27.5 mmHg.  3. Left atrial size was mildly  dilated.  4. Right atrial size was severely dilated.  5. A small pericardial effusion is present. The pericardial effusion is posterior to the left ventricle.  6. The mitral valve is degenerative. Mild to moderate mitral valve regurgitation.  7. The tricuspid valve is abnormal, incomplete coaptation secondardy to annular dilatation. Tricuspid valve regurgitation is severe.  8. The aortic valve is tricuspid. There is mild calcification of the aortic valve. Aortic valve regurgitation is mild. Aortic  valve sclerosis is present, with no evidence of aortic valve stenosis.  9. Aortic dilatation noted. There is mild dilatation of the ascending aorta, measuring 36 mm. 10. Main pulmonary artery dilated. 11. The inferior vena cava is dilated in size with <50% respiratory variability, suggesting right atrial pressure of 15 mmHg. Comparison(s): Prior images reviewed side by side. LVEF normal range at 60-65%. There is moderate RV dysfunction with wide open tricupid regurgitation in the setting of incomplete leaflet coaptation and annular dilatation. FINDINGS  Left Ventricle: Left ventricular ejection fraction, by estimation, is 60 to 65%. The left ventricle has normal function. The left ventricle has no regional wall motion abnormalities. The left ventricular internal cavity size was normal in size. There is  moderate concentric left ventricular hypertrophy. The interventricular septum is flattened in diastole ('D' shaped left ventricle), consistent with right ventricular volume overload. Left ventricular diastolic function could not be evaluated due to atrial fibrillation. Left ventricular diastolic parameters are indeterminate. Right Ventricle: The right ventricular size is moderately enlarged. No increase in right ventricular wall thickness. Right ventricular systolic function is moderately reduced. There is normal pulmonary artery systolic pressure. The tricuspid regurgitant velocity is 1.77 m/s, and with an assumed right atrial pressure of 15 mmHg, the estimated right ventricular systolic pressure is 27.5 mmHg. Left Atrium: Left atrial size was mildly dilated. Right Atrium: Right atrial size was severely dilated. Pericardium: A small pericardial effusion is present. The pericardial effusion is posterior to the left ventricle. Mitral Valve: The mitral valve is degenerative in appearance. There is mild thickening of the mitral valve leaflet(s). There is mild calcification of the mitral valve leaflet(s). Mild to  moderate mitral valve regurgitation, with posteriorly-directed jet.  MV peak gradient, 35.8 mmHg. The mean mitral valve gradient is 2.0 mmHg. Tricuspid Valve: The tricuspid valve is abnormal. Tricuspid valve regurgitation is severe. Aortic Valve: The aortic valve is tricuspid. There is mild calcification of the aortic valve. There is mild aortic valve annular calcification. Aortic valve regurgitation is mild. Aortic valve sclerosis is present, with no evidence of aortic valve stenosis. Pulmonic Valve: The pulmonic valve was grossly normal. Pulmonic valve regurgitation is mild to moderate. Aorta: The aortic root is normal in size and structure and aortic dilatation noted. There is mild dilatation of the ascending aorta, measuring 36 mm. Pulmonary Artery: Main pulmonary artery dilated. Venous: The inferior vena cava is dilated in size with less than 50% respiratory variability, suggesting right atrial pressure of 15 mmHg. IAS/Shunts: No atrial level shunt detected by color flow Doppler.  LEFT VENTRICLE PLAX 2D LVIDd:         3.70 cm   Diastology LVIDs:         2.20 cm   LV e' medial:    8.59 cm/s LV PW:         1.30 cm   LV E/e' medial:  7.9 LV IVS:        1.40 cm   LV e' lateral:   12.40 cm/s LVOT diam:     1.90 cm  LV E/e' lateral: 5.5 LV SV:         26 LV SV Index:   13 LVOT Area:     2.84 cm  RIGHT VENTRICLE RV S prime:     14.10 cm/s TAPSE (M-mode): 1.4 cm LEFT ATRIUM           Index        RIGHT ATRIUM           Index LA diam:      5.00 cm 2.52 cm/m   RA Area:     28.00 cm LA Vol (A4C): 72.2 ml 36.41 ml/m  RA Volume:   99.30 ml  50.08 ml/m  AORTIC VALVE             PULMONIC VALVE LVOT Vmax:   51.70 cm/s  PR End Diast Vel: 2.29 msec LVOT Vmean:  31.500 cm/s LVOT VTI:    0.090 m  AORTA Ao Root diam: 2.90 cm Ao Asc diam:  3.60 cm MITRAL VALVE                 TRICUSPID VALVE MV Area (PHT): 3.72 cm      TR Peak grad:   12.5 mmHg MV Area VTI:   0.66 cm      TR Vmax:        177.00 cm/s MV Peak grad:  35.8 mmHg  MV Mean grad:  2.0 mmHg      SHUNTS MV Vmax:       2.99 m/s      Systemic VTI:  0.09 m MV Vmean:      62.6 cm/s     Systemic Diam: 1.90 cm MV Decel Time: 204 msec MR Peak grad:   74.3 mmHg MR Mean grad:   50.0 mmHg MR Vmax:        431.00 cm/s MR Vmean:       330.0 cm/s MR PISA:        1.57 cm MR PISA Radius: 0.50 cm MV E velocity: 67.90 cm/s MV A velocity: 72.20 cm/s MV E/A ratio:  0.94 Nona Dell MD Electronically signed by Nona Dell MD Signature Date/Time: 07/03/2022/5:01:28 PM    Final    DG Chest Port 1 View  Result Date: 07/02/2022 CLINICAL DATA:  Dyspnea EXAM: PORTABLE CHEST 1 VIEW COMPARISON:  Radiograph 2 days prior. FINDINGS: The heart is enlarged, unchanged. The upper mediastinal contours are stable. There are probable trace left larger than right pleural effusions with adjacent left basilar airspace opacity likely reflecting atelectasis, unchanged. There is no new or worsening focal airspace disease. There is no overt pulmonary edema. There is no pneumothorax There is no acute osseous abnormality. IMPRESSION: Probable trace left larger than right pleural effusions with left basilar atelectasis, unchanged. No new or worsening focal airspace disease. Electronically Signed   By: Lesia Hausen M.D.   On: 07/02/2022 11:47   DG Chest 2 View  Result Date: 06/30/2022 CLINICAL DATA:  Subacute cough. Shortness of breath. Chronic congestive heart failure. EXAM: CHEST - 2 VIEW COMPARISON:  Radiograph 06/19/2022. Chest CT 01/27/2021 FINDINGS: Stable cardiomegaly. Unchanged mediastinal contours. There are small bilateral pleural effusions. No pulmonary edema. No focal airspace disease. No pneumothorax. Left axillary surgical clips. On limited assessment, no acute osseous findings. IMPRESSION: 1. Small bilateral pleural effusions. No pulmonary edema. 2. Stable cardiomegaly. 3. No focal airspace disease or evidence of pneumonia. Electronically Signed   By: Narda Rutherford M.D.   On: 06/30/2022 16:06  Subjective: Pt reports that she is feeling much better, edema is much better.  She is eager to get back home.   Discharge Exam: Vitals:   07/07/22 0347 07/07/22 0900  BP: 110/78 100/68  Pulse: (!) 58 62  Resp: 20   Temp: 98 F (36.7 C)   SpO2: 100% 98%   Vitals:   07/06/22 1412 07/06/22 2034 07/07/22 0347 07/07/22 0900  BP: 103/65 (!) 91/58 110/78 100/68  Pulse: (!) 50 (!) 59 (!) 58 62  Resp:  20 20   Temp:  98 F (36.7 C) 98 F (36.7 C)   TempSrc:  Oral Oral   SpO2:  97% 100% 98%  Weight:   85.6 kg   Height:       General: Pt is alert, awake, not in acute distress Cardiovascular: normal S1/S2 +, no rubs, no gallops Respiratory: CTA bilaterally, no wheezing, no rhonchi Abdominal: Soft, NT, ND, bowel sounds + Extremities: trace pretibial edema, no cyanosis   The results of significant diagnostics from this hospitalization (including imaging, microbiology, ancillary and laboratory) are listed below for reference.     Microbiology: Recent Results (from the past 240 hour(s))  Urine Culture     Status: None   Collection Time: 06/30/22  2:30 PM   Specimen: Urine   UR  Result Value Ref Range Status   Urine Culture, Routine Final report  Final   Organism ID, Bacteria Comment  Final    Comment: Culture shows less than 10,000 colony forming units of bacteria per milliliter of urine. This colony count is not generally considered to be clinically significant.      Labs: BNP (last 3 results) Recent Labs    01/30/22 0439 07/02/22 1148 07/03/22 0421  BNP 403.0* 405.0* 519.0*   Basic Metabolic Panel: Recent Labs  Lab 07/03/22 0421 07/04/22 0554 07/05/22 0502 07/06/22 0511 07/07/22 0416  NA 137 137 138 137 136  K 4.1 4.0 4.0 3.8 4.1  CL 105 105 103 102 101  CO2 23 25 27 26 25   GLUCOSE 103* 108* 101* 113* 106*  BUN 23 22 19 21 23   CREATININE 0.93 0.92 0.86 0.96 1.15*  CALCIUM 8.9 9.0 9.3 9.3 9.4  MG 1.6* 2.0  --   --   --    Liver Function Tests: No  results for input(s): "AST", "ALT", "ALKPHOS", "BILITOT", "PROT", "ALBUMIN" in the last 168 hours. No results for input(s): "LIPASE", "AMYLASE" in the last 168 hours. No results for input(s): "AMMONIA" in the last 168 hours. CBC: Recent Labs  Lab 06/30/22 1533 07/02/22 1148  WBC 7.3 7.3  NEUTROABS 4.6  --   HGB 12.6 12.2  HCT 38.9 38.5  MCV 94.0 95.1  PLT 224 230   Cardiac Enzymes: No results for input(s): "CKTOTAL", "CKMB", "CKMBINDEX", "TROPONINI" in the last 168 hours. BNP: Invalid input(s): "POCBNP" CBG: No results for input(s): "GLUCAP" in the last 168 hours. D-Dimer No results for input(s): "DDIMER" in the last 72 hours. Hgb A1c No results for input(s): "HGBA1C" in the last 72 hours. Lipid Profile No results for input(s): "CHOL", "HDL", "LDLCALC", "TRIG", "CHOLHDL", "LDLDIRECT" in the last 72 hours. Thyroid function studies No results for input(s): "TSH", "T4TOTAL", "T3FREE", "THYROIDAB" in the last 72 hours.  Invalid input(s): "FREET3" Anemia work up No results for input(s): "VITAMINB12", "FOLATE", "FERRITIN", "TIBC", "IRON", "RETICCTPCT" in the last 72 hours. Urinalysis    Component Value Date/Time   COLORURINE YELLOW 10/03/2018 0817   APPEARANCEUR CLOUDY (A) 10/03/2018 0817   LABSPEC  1.012 10/03/2018 0817   PHURINE 5.5 10/03/2018 0817   GLUCOSEU NEGATIVE 10/03/2018 0817   HGBUR 1+ (A) 10/03/2018 0817   HGBUR trace-intact 04/10/2010 1331   BILIRUBINUR negative 06/30/2022 1430   BILIRUBINUR negative 12/30/2015 1554   KETONESUR negative 06/30/2022 1430   KETONESUR NEGATIVE 10/03/2018 0817   PROTEINUR TRACE (A) 10/03/2018 0817   UROBILINOGEN 0.2 06/30/2022 1430   UROBILINOGEN 0.2 05/06/2011 1730   NITRITE Negative 06/30/2022 1430   NITRITE NEGATIVE 05/10/2018 1036   LEUKOCYTESUR Negative 06/30/2022 1430   LEUKOCYTESUR TRACE (A) 05/10/2018 1036   Sepsis Labs Recent Labs  Lab 06/30/22 1533 07/02/22 1148  WBC 7.3 7.3   Microbiology Recent Results (from  the past 240 hour(s))  Urine Culture     Status: None   Collection Time: 06/30/22  2:30 PM   Specimen: Urine   UR  Result Value Ref Range Status   Urine Culture, Routine Final report  Final   Organism ID, Bacteria Comment  Final    Comment: Culture shows less than 10,000 colony forming units of bacteria per milliliter of urine. This colony count is not generally considered to be clinically significant.    Time coordinating discharge: 36 mins   SIGNED:  Standley Dakins, MD  [facility] 07/07/2022, 12:52 PM How to contact the [facility] Attending or Consulting provider 7A - 7P or covering provider during after hours 7P -7A, for this patient?  Check the care team in [facility] and look for a) attending/consulting [facility] provider listed and b) the [facility] team listed Log into [email or web address] and use [facility] universal password to access. If you do not have the password, please contact the hospital operator. Locate the [facility] provider you are looking for under [facility] and page to a number that you can be directly reached. If you still have difficulty reaching the provider, please page the [facility] (Director on Call) for the Hospitalists listed on amion for assistance.

## 2022-07-08 ENCOUNTER — Telehealth: Payer: Self-pay

## 2022-07-08 ENCOUNTER — Ambulatory Visit: Payer: Medicare Other | Attending: Internal Medicine

## 2022-07-08 ENCOUNTER — Other Ambulatory Visit: Payer: Self-pay | Admitting: Internal Medicine

## 2022-07-08 ENCOUNTER — Telehealth: Payer: Self-pay | Admitting: *Deleted

## 2022-07-08 DIAGNOSIS — I4891 Unspecified atrial fibrillation: Secondary | ICD-10-CM

## 2022-07-08 NOTE — Transitions of Care (Post Inpatient/ED Visit) (Signed)
07/08/2022  Name: Theresa Garcia MRN: [id number] DOB: December 12, 1940  Today's TOC FU Call Status: Today's TOC FU Call Status:: Successful TOC FU Call Competed TOC FU Call Complete Date: 07/08/22  Transition Care Management Follow-up Telephone Call Date of Discharge: 07/07/22 [facility] Type of Discharge: Inpatient Admission Reason for ED Visit:  (heart failure) How have you been since you were released from the hospital?: Better Any questions or concerns?: No  Items Reviewed: Did you receive and understand the discharge instructions provided?: Yes Medications obtained,verified, and reconciled?: Yes (Medications Reviewed) Any new allergies since your discharge?: No Dietary orders reviewed?: Yes Do you have support at home?: Yes People in Home: sibling(s)  Medications Reviewed Today: Medications Reviewed Today     Reviewed by Karena Addison, LPN (Licensed Practical Nurse) on 07/08/22 at 1013  Med List Status: <None>   Medication Order Taking? Sig Documenting Provider Last Dose Status Informant  acetaminophen (TYLENOL) 500 MG tablet [id number] Yes Take 1,000 mg by mouth every 6 (six) hours as needed for mild pain or moderate pain.  [provider] Taking Active Family Member, Self  acyclovir (ZOVIRAX) 400 MG tablet [id number] Yes Take 1 tablet (400 mg total) by mouth 3 (three) times daily for 2 doses. Cleora Fleet, MD Taking Active   alendronate (FOSAMAX) 70 MG tablet [id number] Yes TAKE 1 TABLET BY MOUTH EVERY 7  DAYS WITH A FULL GLASS OF WATER  ON AN EMPTY STOMACH  Patient taking differently: Take 70 mg by mouth once a week.   Kerri Perches, MD Taking Active Family Member, Self           Med Note Sharlene Dory   Tue Jan 27, 2022  1:33 PM) Sundays   amLODipine-benazepril (LOTREL) 5-40 MG capsule [id number] Yes Take 1 capsule by mouth daily. [provider] Taking Active Self, Family Member  atorvastatin (LIPITOR) 20 MG  tablet [id number] Yes TAKE 1 TABLET BY MOUTH IN THE  Yetta Glassman, MD Taking Active Self, Family Member  Calcium Carb-Cholecalciferol (CALTRATE 600+D3) 600-20 MG-MCG TABS [id number] Yes Take 1 tablet by mouth daily. Take one tablet by mouth one time daily Kerri Perches, MD Taking Active Family Member, Self  Elastic Bandages & Supports MISC [id number] Yes 1 Package by Does not apply route as directed. Sharlene Dory, NP Taking Active Self, Family Member  ELIQUIS 5 MG TABS tablet [id number] Yes TAKE 1 TABLET BY MOUTH TWICE  DAILY Kerri Perches, MD Taking Active Family Member, Self  FLUoxetine (PROZAC) 10 MG capsule [id number] Yes TAKE 1 CAPSULE BY MOUTH DAILY Kerri Perches, MD Taking Active Family Member, Self  furosemide (LASIX) 20 MG tablet [id number] Yes Take 3 tablets (60 mg total) by mouth 2 (two) times daily. Cleora Fleet, MD Taking Active   meclizine (ANTIVERT) 25 MG tablet [id number] Yes TAKE 1 TABLET BY MOUTH THREE TIMES DAILY AS NEEDED FOR DIZZINESS  Patient taking differently: Take 25 mg by mouth 3 (three) times daily as needed for dizziness.   Kerri Perches, MD Taking Active Family Member, Self  metoprolol tartrate (LOPRESSOR) 25 MG tablet [id number] Yes Take 12.5 mg by mouth 2 (two) times daily. [provider] Taking Active Self, Family Member  Misc. Devices (TOILET SAFETY [place]) [place] [id number] Yes 1 each by Does not apply route daily as needed. Eustace Moore, MD Taking Active Family Member, Self  potassium chloride SA (KLOR-CON M) 10 MEQ tablet [id number] Yes Take 1 tablet (10  mEq total) by mouth every other day. Cleora Fleet, MD Taking Active   temazepam (RESTORIL) 30 MG capsule [id number] Yes TAKE 1 CAPSULE BY MOUTH EVERY  NIGHT AT BEDTIME Kerri Perches, MD Taking Active Self, Family Member  vitamin C (ASCORBIC ACID) 500 MG tablet [id number] Yes Take 500 mg by mouth daily. [provider] Taking Active Family Member,  Self  Vitamin D, Ergocalciferol, (DRISDOL) 1.25 MG (50000 UNIT) CAPS capsule [id number] Yes TAKE 1 CAPSULE BY MOUTH ONCE  WEEKLY Kerri Perches, MD Taking Active Self, Family Member            Home Care and Equipment/Supplies: Were Home Health Services Ordered?: NA Any new equipment or medical supplies ordered?: NA  Functional Questionnaire: Do you need assistance with bathing/showering or dressing?: Yes Do you need assistance with meal preparation?: Yes Do you need assistance with eating?: No Do you have difficulty maintaining continence: No Do you need assistance with getting out of bed/getting out of a chair/moving?: No Do you have difficulty managing or taking your medications?: No  Follow up appointments reviewed: PCP Follow-up appointment confirmed?: NA MD Provider Line Number:[phone] Given: No Specialist Hospital Follow-up appointment confirmed?: Yes Date of Specialist follow-up appointment?: 08/03/22 Follow-Up Specialty Provider:: Sharlene Dory Do you need transportation to your follow-up appointment?: No Do you understand care options if your condition(s) worsen?: Yes-patient verbalized understanding    SIGNATURE Karena Addison, LPN [facility] Nurse Health Advisor Direct Dial [phone]

## 2022-07-08 NOTE — Telephone Encounter (Signed)
Received request for a 2 week monitor from Dr. Laural Benes. Pt enrolled so monitor would be mailed.

## 2022-07-09 ENCOUNTER — Ambulatory Visit: Payer: Medicare Other | Admitting: Family Medicine

## 2022-07-11 ENCOUNTER — Encounter (HOSPITAL_COMMUNITY): Payer: Self-pay

## 2022-07-11 ENCOUNTER — Inpatient Hospital Stay: Admit: 2022-07-11 | Payer: Medicare Other | Admitting: Internal Medicine

## 2022-07-14 ENCOUNTER — Ambulatory Visit: Payer: Medicare Other | Attending: Internal Medicine | Admitting: Internal Medicine

## 2022-07-14 ENCOUNTER — Inpatient Hospital Stay (HOSPITAL_COMMUNITY)
Admission: EM | Admit: 2022-07-14 | Discharge: 2022-07-17 | DRG: 286 | Disposition: A | Discharge status: Home / Self Care | Payer: Medicare Other | Source: Other Acute Inpatient Hospital | Attending: Cardiology | Admitting: Cardiology

## 2022-07-14 ENCOUNTER — Encounter (HOSPITAL_COMMUNITY): Payer: Self-pay

## 2022-07-14 ENCOUNTER — Encounter: Payer: Self-pay | Admitting: Internal Medicine

## 2022-07-14 VITALS — BP 128/88 | HR 69 | Ht 67.0 in | Wt 189.4 lb

## 2022-07-14 DIAGNOSIS — Z86711 Personal history of pulmonary embolism: Secondary | ICD-10-CM | POA: Diagnosis not present

## 2022-07-14 DIAGNOSIS — Z79899 Other long term (current) drug therapy: Secondary | ICD-10-CM

## 2022-07-14 DIAGNOSIS — Z923 Personal history of irradiation: Secondary | ICD-10-CM

## 2022-07-14 DIAGNOSIS — K219 Gastro-esophageal reflux disease without esophagitis: Secondary | ICD-10-CM | POA: Diagnosis present

## 2022-07-14 DIAGNOSIS — I110 Hypertensive heart disease with heart failure: Principal | ICD-10-CM | POA: Diagnosis present

## 2022-07-14 DIAGNOSIS — E876 Hypokalemia: Secondary | ICD-10-CM | POA: Diagnosis not present

## 2022-07-14 DIAGNOSIS — Z833 Family history of diabetes mellitus: Secondary | ICD-10-CM

## 2022-07-14 DIAGNOSIS — I083 Combined rheumatic disorders of mitral, aortic and tricuspid valves: Secondary | ICD-10-CM | POA: Diagnosis present

## 2022-07-14 DIAGNOSIS — I5032 Chronic diastolic (congestive) heart failure: Secondary | ICD-10-CM

## 2022-07-14 DIAGNOSIS — Z888 Allergy status to other drugs, medicaments and biological substances status: Secondary | ICD-10-CM

## 2022-07-14 DIAGNOSIS — Z853 Personal history of malignant neoplasm of breast: Secondary | ICD-10-CM

## 2022-07-14 DIAGNOSIS — I5082 Biventricular heart failure: Secondary | ICD-10-CM | POA: Diagnosis present

## 2022-07-14 DIAGNOSIS — I5043 Acute on chronic combined systolic (congestive) and diastolic (congestive) heart failure: Secondary | ICD-10-CM | POA: Diagnosis present

## 2022-07-14 DIAGNOSIS — G4700 Insomnia, unspecified: Secondary | ICD-10-CM | POA: Diagnosis present

## 2022-07-14 DIAGNOSIS — Z825 Family history of asthma and other chronic lower respiratory diseases: Secondary | ICD-10-CM

## 2022-07-14 DIAGNOSIS — F32A Depression, unspecified: Secondary | ICD-10-CM | POA: Diagnosis present

## 2022-07-14 DIAGNOSIS — Z880 Allergy status to penicillin: Secondary | ICD-10-CM

## 2022-07-14 DIAGNOSIS — I77810 Thoracic aortic ectasia: Secondary | ICD-10-CM | POA: Diagnosis present

## 2022-07-14 DIAGNOSIS — I071 Rheumatic tricuspid insufficiency: Secondary | ICD-10-CM

## 2022-07-14 DIAGNOSIS — I5031 Acute diastolic (congestive) heart failure: Secondary | ICD-10-CM | POA: Diagnosis not present

## 2022-07-14 DIAGNOSIS — I4819 Other persistent atrial fibrillation: Secondary | ICD-10-CM | POA: Diagnosis present

## 2022-07-14 DIAGNOSIS — Z7901 Long term (current) use of anticoagulants: Secondary | ICD-10-CM

## 2022-07-14 DIAGNOSIS — I4891 Unspecified atrial fibrillation: Secondary | ICD-10-CM

## 2022-07-14 DIAGNOSIS — I252 Old myocardial infarction: Secondary | ICD-10-CM | POA: Diagnosis not present

## 2022-07-14 DIAGNOSIS — I509 Heart failure, unspecified: Principal | ICD-10-CM

## 2022-07-14 DIAGNOSIS — I5033 Acute on chronic diastolic (congestive) heart failure: Secondary | ICD-10-CM | POA: Diagnosis not present

## 2022-07-14 DIAGNOSIS — Z9221 Personal history of antineoplastic chemotherapy: Secondary | ICD-10-CM

## 2022-07-14 DIAGNOSIS — Z87891 Personal history of nicotine dependence: Secondary | ICD-10-CM | POA: Diagnosis not present

## 2022-07-14 DIAGNOSIS — Z8249 Family history of ischemic heart disease and other diseases of the circulatory system: Secondary | ICD-10-CM

## 2022-07-14 DIAGNOSIS — E785 Hyperlipidemia, unspecified: Secondary | ICD-10-CM | POA: Diagnosis present

## 2022-07-14 DIAGNOSIS — Z830 Family history of human immunodeficiency virus [HIV] disease: Secondary | ICD-10-CM

## 2022-07-14 DIAGNOSIS — I2729 Other secondary pulmonary hypertension: Secondary | ICD-10-CM | POA: Diagnosis present

## 2022-07-14 DIAGNOSIS — Z66 Do not resuscitate: Secondary | ICD-10-CM | POA: Diagnosis present

## 2022-07-14 DIAGNOSIS — G8929 Other chronic pain: Secondary | ICD-10-CM | POA: Diagnosis present

## 2022-07-14 DIAGNOSIS — M810 Age-related osteoporosis without current pathological fracture: Secondary | ICD-10-CM | POA: Diagnosis present

## 2022-07-14 DIAGNOSIS — I361 Nonrheumatic tricuspid (valve) insufficiency: Secondary | ICD-10-CM | POA: Diagnosis not present

## 2022-07-14 DIAGNOSIS — I700 Atherosclerosis of aorta: Secondary | ICD-10-CM | POA: Diagnosis present

## 2022-07-14 DIAGNOSIS — F419 Anxiety disorder, unspecified: Secondary | ICD-10-CM | POA: Diagnosis present

## 2022-07-14 DIAGNOSIS — Z7983 Long term (current) use of bisphosphonates: Secondary | ICD-10-CM

## 2022-07-14 HISTORY — DX: Cardiac arrhythmia, unspecified: I49.9

## 2022-07-14 HISTORY — DX: Heart failure, unspecified: I50.9

## 2022-07-14 LAB — CBC WITH DIFFERENTIAL/PLATELET
Abs Immature Granulocytes: 0.01 10*3/uL (ref 0.00–0.07)
Basophils Absolute: 0 10*3/uL (ref 0.0–0.1)
Basophils Relative: 0 %
Eosinophils Absolute: 0.2 10*3/uL (ref 0.0–0.5)
Eosinophils Relative: 2 %
HCT: 42.2 % (ref 36.0–46.0)
Hemoglobin: 13.6 g/dL (ref 12.0–15.0)
Immature Granulocytes: 0 %
Lymphocytes Relative: 28 %
Lymphs Abs: 2.3 10*3/uL (ref 0.7–4.0)
MCH: 30.2 pg (ref 26.0–34.0)
MCHC: 32.2 g/dL (ref 30.0–36.0)
MCV: 93.8 fL (ref 80.0–100.0)
Monocytes Absolute: 0.6 10*3/uL (ref 0.1–1.0)
Monocytes Relative: 7 %
Neutro Abs: 5.2 10*3/uL (ref 1.7–7.7)
Neutrophils Relative %: 63 %
Platelets: 230 10*3/uL (ref 150–400)
RBC: 4.5 MIL/uL (ref 3.87–5.11)
RDW: 16.1 % — ABNORMAL HIGH (ref 11.5–15.5)
WBC: 8.3 10*3/uL (ref 4.0–10.5)
nRBC: 0 % (ref 0.0–0.2)

## 2022-07-14 LAB — LACTIC ACID, PLASMA: Lactic Acid, Venous: 1.6 mmol/L (ref 0.5–1.9)

## 2022-07-14 LAB — COMPREHENSIVE METABOLIC PANEL
ALT: 11 U/L (ref 0–44)
AST: 21 U/L (ref 15–41)
Albumin: 3.5 g/dL (ref 3.5–5.0)
Alkaline Phosphatase: 72 U/L (ref 38–126)
Anion gap: 12 (ref 5–15)
BUN: 12 mg/dL (ref 8–23)
CO2: 23 mmol/L (ref 22–32)
Calcium: 9.8 mg/dL (ref 8.9–10.3)
Chloride: 102 mmol/L (ref 98–111)
Creatinine, Ser: 0.93 mg/dL (ref 0.44–1.00)
GFR, Estimated: >60 mL/min (ref >60)
Glucose, Bld: 121 mg/dL — ABNORMAL HIGH (ref 70–99)
Potassium: 4 mmol/L (ref 3.5–5.1)
Sodium: 137 mmol/L (ref 135–145)
Total Bilirubin: 1.5 mg/dL — ABNORMAL HIGH (ref 0.3–1.2)
Total Protein: 7.4 g/dL (ref 6.5–8.1)

## 2022-07-14 LAB — PROTIME-INR
INR: 1.2 (ref 0.8–1.2)
Prothrombin Time: 15.8 seconds — ABNORMAL HIGH (ref 11.4–15.2)

## 2022-07-14 LAB — TROPONIN I (HIGH SENSITIVITY): Troponin I (High Sensitivity): 11 ng/L (ref <18)

## 2022-07-14 LAB — TSH: TSH: 1.491 u[IU]/mL (ref 0.350–4.500)

## 2022-07-14 LAB — BRAIN NATRIURETIC PEPTIDE: B Natriuretic Peptide: 474.5 pg/mL — ABNORMAL HIGH (ref 0.0–100.0)

## 2022-07-14 LAB — MAGNESIUM: Magnesium: 2 mg/dL (ref 1.7–2.4)

## 2022-07-14 MED ORDER — SODIUM CHLORIDE 0.9% FLUSH: 3.0000 mL | INTRAVENOUS | Status: DC | PRN

## 2022-07-14 MED ORDER — ATORVASTATIN CALCIUM 10 MG PO TABS
20.0000 mg | ORAL_TABLET | Freq: Every day | ORAL | Status: DC
  Administered 2022-07-14 – 2022-07-16 (×3): 20 mg via ORAL
  Filled 2022-07-14 (×3): qty 2

## 2022-07-14 MED ORDER — AMLODIPINE BESYLATE 5 MG PO TABS
5.0000 mg | ORAL_TABLET | Freq: Every day | ORAL | Status: DC
  Administered 2022-07-15 – 2022-07-16 (×2): 5 mg via ORAL
  Filled 2022-07-14 (×3): qty 1

## 2022-07-14 MED ORDER — FLUOXETINE HCL 10 MG PO CAPS
10.0000 mg | ORAL_CAPSULE | Freq: Every day | ORAL | Status: DC
  Administered 2022-07-15 – 2022-07-17 (×3): 10 mg via ORAL
  Filled 2022-07-14 (×4): qty 1

## 2022-07-14 MED ORDER — HEPARIN BOLUS VIA INFUSION
4000.0000 [IU] | Freq: Once | INTRAVENOUS | Status: AC
  Administered 2022-07-14: 4000 [IU] via INTRAVENOUS
  Filled 2022-07-14: qty 4000

## 2022-07-14 MED ORDER — TEMAZEPAM 15 MG PO CAPS
30.0000 mg | ORAL_CAPSULE | Freq: Once | ORAL | Status: AC
  Administered 2022-07-14: 30 mg via ORAL
  Filled 2022-07-14: qty 2

## 2022-07-14 MED ORDER — SODIUM CHLORIDE 0.9 % IV SOLN: 250.0000 mL | INTRAVENOUS | Status: DC | PRN

## 2022-07-14 MED ORDER — BENAZEPRIL HCL 20 MG PO TABS
40.0000 mg | ORAL_TABLET | Freq: Every day | ORAL | Status: DC
  Administered 2022-07-15 – 2022-07-16 (×2): 40 mg via ORAL
  Filled 2022-07-14 (×2): qty 2

## 2022-07-14 MED ORDER — HYDROCORTISONE 1 % EX CREA
TOPICAL_CREAM | Freq: Two times a day (BID) | CUTANEOUS | Status: DC
  Filled 2022-07-14: qty 28

## 2022-07-14 MED ORDER — FUROSEMIDE 10 MG/ML IJ SOLN: 40.0000 mg | Freq: Two times a day (BID) | INTRAMUSCULAR | Status: DC

## 2022-07-14 MED ORDER — DIPHENHYDRAMINE HCL 25 MG PO CAPS: 25.0000 mg | ORAL_CAPSULE | Freq: Once | ORAL | Status: DC

## 2022-07-14 MED ORDER — HEPARIN (PORCINE) 25000 UT/250ML-% IV SOLN
1050.0000 [IU]/h | INTRAVENOUS | Status: DC
  Administered 2022-07-14: 1200 [IU]/h via INTRAVENOUS
  Filled 2022-07-14: qty 250

## 2022-07-14 MED ORDER — ONDANSETRON HCL 4 MG/2ML IJ SOLN: 4.0000 mg | Freq: Four times a day (QID) | INTRAMUSCULAR | Status: DC | PRN

## 2022-07-14 MED ORDER — ACETAMINOPHEN 325 MG PO TABS: 650.0000 mg | ORAL_TABLET | ORAL | Status: DC | PRN

## 2022-07-14 MED ORDER — SODIUM CHLORIDE 0.9% FLUSH
3.0000 mL | Freq: Two times a day (BID) | INTRAVENOUS | Status: DC
  Administered 2022-07-14 – 2022-07-17 (×5): 3 mL via INTRAVENOUS

## 2022-07-14 NOTE — Progress Notes (Signed)
ANTICOAGULATION CONSULT NOTE - Initial Consult  Pharmacy Consult for Heparin infusion Indication: atrial fibrillation  Allergies  Allergen Reactions   Meclizine Diarrhea   Penicillins Rash and Other (See Comments)    Has patient had a PCN reaction causing immediate rash, facial/tongue/throat swelling, SOB or lightheadedness with hypotension: no Has patient had a PCN reaction causing severe rash involving mucus membranes or skin necrosis: No Has patient had a PCN reaction that required hospitalization No Has patient had a PCN reaction occurring within the last 10 years: No If all of the above answers are "NO", then may proceed with Cephalosporin use.     Patient Measurements: Height: 5\' 7"  (170.2 cm) Weight: 85.9 kg (189 lb 6.4 oz) IBW/kg (Calculated) : 61.6 Heparin Dosing Weight: 79.7 kg  Vital Signs: Temp: 98.3 F (36.8 C) (05/21 1823) Temp Source: Oral (05/21 1823) BP: 156/89 (05/21 1823) Pulse Rate: 74 (05/21 1823)  Labs: Recent Labs    07/14/22 1856  HGB 13.6  HCT 42.2  PLT 230  LABPROT 15.8*  INR 1.2  CREATININE 0.93    Estimated Creatinine Clearance: 55.2 mL/min (by C-G formula based on SCr of 0.93 mg/dL).   Medical History: Past Medical History:  Diagnosis Date   (HFpEF) heart failure with preserved ejection fraction (HCC)    A-fib (HCC)    Dx: 01/2022   Breast cancer (HCC) 02/24/2000   Bronchitis    Cancer (HCC)    Chronic back pain    Depression    GERD (gastroesophageal reflux disease)    Hematuria    Hyperlipidemia    Hypertension    Insomnia    Kidney stone    Personal history of chemotherapy    Personal history of radiation therapy    Pleural effusion    Pneumonia    Thyroid goiter    Vertigo     Medications:  Medications Prior to Admission  Medication Sig Dispense Refill Last Dose   acetaminophen (TYLENOL) 500 MG tablet Take 1,000 mg by mouth every 6 (six) hours as needed for mild pain or moderate pain.    Past Week   acyclovir  (ZOVIRAX) 400 MG tablet Take 400 mg by mouth daily.   Past Week   alendronate (FOSAMAX) 70 MG tablet TAKE 1 TABLET BY MOUTH EVERY 7  DAYS WITH A FULL GLASS OF WATER  ON AN EMPTY STOMACH (Patient taking differently: Take 70 mg by mouth once a week.) 12 tablet 3 Past Week   amLODipine-benazepril (LOTREL) 5-40 MG capsule Take 1 capsule by mouth daily.   Past Week   atorvastatin (LIPITOR) 20 MG tablet TAKE 1 TABLET BY MOUTH IN THE  EVENING 90 tablet 3 Past Week   Calcium Carb-Cholecalciferol (CALTRATE 600+D3) 600-20 MG-MCG TABS Take 1 tablet by mouth daily. 60 tablet 11 Past Week   ELIQUIS 5 MG TABS tablet TAKE 1 TABLET BY MOUTH TWICE  DAILY 180 tablet 3 Past Week   FLUoxetine (PROZAC) 10 MG capsule TAKE 1 CAPSULE BY MOUTH DAILY 90 capsule 3 Past Week   furosemide (LASIX) 20 MG tablet Take 3 tablets (60 mg total) by mouth 2 (two) times daily. 180 tablet 1 Past Week   meclizine (ANTIVERT) 25 MG tablet TAKE 1 TABLET BY MOUTH THREE TIMES DAILY AS NEEDED FOR DIZZINESS (Patient taking differently: Take 25 mg by mouth 3 (three) times daily as needed for dizziness.) 30 tablet 0 Past Week   metoprolol tartrate (LOPRESSOR) 25 MG tablet Take 12.5 mg by mouth 2 (two) times daily.  Past Week   ondansetron (ZOFRAN) 4 MG tablet Take 4 mg by mouth as needed for vomiting or nausea.   07/14/2022   potassium chloride SA (KLOR-CON M) 10 MEQ tablet Take 1 tablet (10 mEq total) by mouth every other day. 15 tablet 1 Past Week   temazepam (RESTORIL) 30 MG capsule TAKE 1 CAPSULE BY MOUTH EVERY  NIGHT AT BEDTIME 90 capsule 3 Past Week   vitamin C (ASCORBIC ACID) 500 MG tablet Take 500 mg by mouth daily.   Past Week   Vitamin D, Ergocalciferol, (DRISDOL) 1.25 MG (50000 UNIT) CAPS capsule TAKE 1 CAPSULE BY MOUTH ONCE  WEEKLY 13 capsule 3 Past Week   Elastic Bandages & Supports MISC 1 Package by Does not apply route as directed. 1 Package 0    Misc. Devices (TOILET SAFETY FRAME) MISC 1 each by Does not apply route daily as needed.  1 each 0     Assessment: 82 yo F presents to ED from cardiology clinic for acute HF exacerbation with volume overload. Pt has a history of persistent Afib and takes apixaban 5mg  BID prior to admission. Patient reports last dose of apixaban was taken on 07/07/22, but she also admits she has been confused lately. Pharmacy has been consulted to dose heparin infusion for Afib in anticipation of RHC on 5/22.  CBC wnl  No s/sx of bleeding noted  Goal of Therapy:  Heparin level 0.3-0.7 units/ml aPTT 66-102 seconds Monitor platelets by anticoagulation protocol: Yes   Plan:  Give heparin 4000 units IV x1 bolus, then  Start heparin infusion at 1200 units/hr Check aPTT and heparin level in 8 hours Monitor daily CBC, aPTT and heparin levels until correlating (due to recent apixaban administration), and for s/sx of bleeding F/u plan for RHC on 5/22   Wilburn Cornelia, PharmD, BCPS Clinical Pharmacist 07/14/2022 8:28 PM   Please refer to AMION for pharmacy phone number

## 2022-07-14 NOTE — Progress Notes (Addendum)
Cardiology Office Note:    Date:  07/14/2022   ID:  Juliani, Kenison [birth year], MRN [id number]  PCP:  Kerri Perches, MD   [facility] Providers Cardiologist:  Alverda Skeans, MD Referring MD: Kerri Perches, MD   Chief Complaint/Reason for Referral: Tricuspid regurgitation  ASSESSMENT:    1. Acute diastolic congestive heart failure (HCC)   2. Severe tricuspid regurgitation   3. Atrial fibrillation, unspecified type (HCC)     PLAN:    In order of problems listed above: 1.  Acute diastolic heart failure: The patient is overtly volume overloaded.  We will directly admit the patient.  I have also talked to the advanced heart failure division will see the patient in the morning.  The patient needs intensive diuresis and a right heart catheterization to assess her cardiac hemodynamics.  Ultimately I think she will be best served by a Tri clip procedure.  I did discuss with the patient and her family that we are currently not performing Tri clip procedures and that we would likely refer the patient to [facility] for an evaluation once she is medically stabilized.  I am hopeful that this can be achieved, we can obtain right heart catheterization data and possibly a TEE which we will transmit to [facility] for an outpatient evaluation.  She is warm and well-perfused with an adequate blood pressure. 2.  Torrential tricuspid regurgitation: She will need further evaluation for tricuspid intervention.  If her coaptation gaps are less than 8 mm then a Tri clip procedure may be feasible.  Otherwise if her annulus is big enough then an Evoque valve may need to be considered.  We currently do not have these platforms at her facility. 3.  Atrial fibrillation: EKG today demonstrates rate controlled atrial fibrillation.  We will plan on stopping the patient's Eliquis and starting heparin drip in anticipation for of a angiography and right heart catheterization procedure.             Dispo:   No follow-ups on file.      Medication Adjustments/Labs and Tests Ordered: Current medicines are reviewed at length with the patient today.  Concerns regarding medicines are outlined above.  The following changes have been made:    Labs/tests ordered: Orders Placed This Encounter  Procedures   EKG 12-Lead    Medication Changes: No orders of the defined types were placed in this encounter.    Current medicines are reviewed at length with the patient today.  The patient does not have concerns regarding medicines.   History of Present Illness:    FOCUSED PROBLEM LIST:   1.  Atrial fibrillation on Eliquis 2.  Hypertension 3.  CKD stage III 4.  Torrential tricuspid regurgitation with moderate RV dysfunction.   5.  Remote left-sided breast cancer status post chemotherapy and radiation   The patient is a 82 y.o. female with the indicated medical history here for recommendations regarding her torrential tricuspid regurgitation.  The patient was admitted to [facility] recently with acute on chronic diastolic heart failure exacerbation.  She was treated with IV diuretics and ultimately was discharged on Lasix 60 mg twice daily.  An echocardiogram demonstrated an ejection fraction of 60 to 65% with a dilated right ventricle with moderate right ventricular dysfunction with torrential tricuspid regurgitation due to noncoaptation.  She tells me that she has been severely fatigued and short of breath for several months.  She has noticed increasing nausea as well but tells me she  is able to keep food down.  She reports peripheral edema, paroxysmal nocturnal dyspnea, and orthopnea.  She is unable to do much of her activities of daily living that she was able to do prior to her admission in December.    In December, he was admitted with a 16 pound weight gain and a BNP of greater than 800.  She was found to be in new onset atrial fibrillation.  She ultimately was diuresed and discharged on  Lasix 40 mg twice a day.  She was seen in early February and was doing fairly well on medical therapy.  Her torsemide at that time was increased.  Unfortunately she presented again with overt right heart failure in early May.  Her BNP at this time was 405.  She again was medically stabilized with intensive diuretic therapy.  She was discharged on Lasix 60 mg twice daily with an event monitor. She then presented to [facility] on May 18 due to failure to thrive, nausea, and shortness of breath.  There was a discussion about admission at that facility however the patient and her family did not want to forego her outpatient evaluation regarding her tricuspid regurgitation and ended up leaving AMA.  Today she tells me she is very short of breath, nauseated, and feels unwell.          Current Medications: Current Meds  Medication Sig   acetaminophen (TYLENOL) 500 MG tablet Take 1,000 mg by mouth every 6 (six) hours as needed for mild pain or moderate pain.    acyclovir (ZOVIRAX) 400 MG tablet Take 400 mg by mouth daily.   alendronate (FOSAMAX) 70 MG tablet TAKE 1 TABLET BY MOUTH EVERY 7  DAYS WITH A FULL GLASS OF WATER  ON AN EMPTY STOMACH (Patient taking differently: Take 70 mg by mouth once a week.)   amLODipine-benazepril (LOTREL) 5-40 MG capsule Take 1 capsule by mouth daily.   atorvastatin (LIPITOR) 20 MG tablet TAKE 1 TABLET BY MOUTH IN THE  EVENING   Calcium Carb-Cholecalciferol (CALTRATE 600+D3) 600-20 MG-MCG TABS Take 1 tablet by mouth daily. Take one tablet by mouth one time daily   Elastic Bandages & Supports MISC 1 Package by Does not apply route as directed.   ELIQUIS 5 MG TABS tablet TAKE 1 TABLET BY MOUTH TWICE  DAILY   FLUoxetine (PROZAC) 10 MG capsule TAKE 1 CAPSULE BY MOUTH DAILY   furosemide (LASIX) 20 MG tablet Take 3 tablets (60 mg total) by mouth 2 (two) times daily.   meclizine (ANTIVERT) 25 MG tablet TAKE 1 TABLET BY MOUTH THREE TIMES DAILY AS NEEDED FOR DIZZINESS  (Patient taking differently: Take 25 mg by mouth 3 (three) times daily as needed for dizziness.)   metoprolol tartrate (LOPRESSOR) 25 MG tablet Take 12.5 mg by mouth 2 (two) times daily.   Misc. Devices (TOILET SAFETY FRAME) MISC 1 each by Does not apply route daily as needed.   ondansetron (ZOFRAN) 4 MG tablet Take 4 mg by mouth as needed.   potassium chloride SA (KLOR-CON M) 10 MEQ tablet Take 1 tablet (10 mEq total) by mouth every other day.   temazepam (RESTORIL) 30 MG capsule TAKE 1 CAPSULE BY MOUTH EVERY  NIGHT AT BEDTIME   vitamin C (ASCORBIC ACID) 500 MG tablet Take 500 mg by mouth daily.   Vitamin D, Ergocalciferol, (DRISDOL) 1.25 MG (50000 UNIT) CAPS capsule TAKE 1 CAPSULE BY MOUTH ONCE  WEEKLY     Allergies:    Meclizine and Penicillins  Social History:   Social History   Tobacco Use   Smoking status: Former    Packs/day: 1.00    Years: 5.00    Additional pack years: 0.00    Total pack years: 5.00    Types: Cigarettes    Quit date: 09/07/1980    Years since quitting: 41.8   Smokeless tobacco: Never   Tobacco comments:    quit 25+ years ago  Vaping Use   Vaping Use: Never used  Substance Use Topics   Alcohol use: No    Alcohol/week: 0.0 standard drinks of alcohol   Drug use: No     Family Hx: Family History  Problem Relation Age of Onset   COPD Father    COPD Sister    COPD Sister    Diabetes Sister    Diabetes Brother    Seizures Mother    Bone cancer Brother    Heart disease Brother    Aneurysm Brother        brain   HIV/AIDS Brother      Review of Systems:   Please see the history of present illness.    All other systems reviewed and are negative.     EKGs/Labs/Other Test Reviewed:    EKG:  EKG performed Jul 02, 2022 that I personally reviewed demonstrates rate controlled atrial fibrillation; EKG performed today that I personally reviewed demonstrates rate controlled atrial fibrillation with anterior infarction pattern.  Prior CV  studies:  Cardiac Studies & Procedures       ECHOCARDIOGRAM  ECHOCARDIOGRAM COMPLETE 07/03/2022  Narrative ECHOCARDIOGRAM REPORT    Patient Name:   KIMMY TERZIC Date of Exam: 07/03/2022 Medical Rec #:  [id number]        Height:       67.0 in Accession #:    [id number]       Weight:       190.9 lb Date of Birth:  1940/07/29       BSA:          1.983 m Patient Age:    82 years         BP:           95/66 mmHg Patient Gender: F                HR:           67 bpm. [facility]  Procedure: 2D Echo, Cardiac Doppler and Color Doppler  Indications:    Tricuspid Valve Disease I07.9 CHF-Acute Diastolic I50.31  History:        Patient has prior history of Echocardiogram examinations, most recent 01/28/2022. CHF and Right heart enlargement, History of breast cancer, TV Disease, Arrythmias:Atrial Fibrillation, Signs/Symptoms:Shortness of Breath; Risk Factors:Hypertension, Former Smoker and Dyslipidemia.  Sonographer:    Aron Baba Referring Phys: [id number] SAMUEL G MCDOWELL   Sonographer Comments: Image acquisition challenging due to patient body habitus and Image acquisition challenging due to respiratory motion. IMPRESSIONS   1. Left ventricular ejection fraction, by estimation, is 60 to 65%. The left ventricle has normal function. The left ventricle has no regional wall motion abnormalities. There is moderate concentric left ventricular hypertrophy. Left ventricular diastolic parameters are indeterminate. There is the interventricular septum is flattened in diastole ('D' shaped left ventricle), consistent with right ventricular volume overload. 2. Right ventricular systolic function is moderately reduced. The right ventricular size is moderately enlarged. There is normal pulmonary artery systolic pressure. The estimated right ventricular systolic pressure is 27.5  mmHg. 3. Left atrial size was mildly dilated. 4. Right atrial size was severely dilated. 5. A small  pericardial effusion is present. The pericardial effusion is posterior to the left ventricle. 6. The mitral valve is degenerative. Mild to moderate mitral valve regurgitation. 7. The tricuspid valve is abnormal, incomplete coaptation secondardy to annular dilatation. Tricuspid valve regurgitation is severe. 8. The aortic valve is tricuspid. There is mild calcification of the aortic valve. Aortic valve regurgitation is mild. Aortic valve sclerosis is present, with no evidence of aortic valve stenosis. 9. Aortic dilatation noted. There is mild dilatation of the ascending aorta, measuring 36 mm. 10. Main pulmonary artery dilated. 11. The inferior vena cava is dilated in size with <50% respiratory variability, suggesting right atrial pressure of 15 mmHg.  Comparison(s): Prior images reviewed side by side. LVEF normal range at 60-65%. There is moderate RV dysfunction with wide open tricupid regurgitation in the setting of incomplete leaflet coaptation and annular dilatation.  FINDINGS Left Ventricle: Left ventricular ejection fraction, by estimation, is 60 to 65%. The left ventricle has normal function. The left ventricle has no regional wall motion abnormalities. The left ventricular internal cavity size was normal in size. There is moderate concentric left ventricular hypertrophy. The interventricular septum is flattened in diastole ('D' shaped left ventricle), consistent with right ventricular volume overload. Left ventricular diastolic function could not be evaluated due to atrial fibrillation. Left ventricular diastolic parameters are indeterminate.  Right Ventricle: The right ventricular size is moderately enlarged. No increase in right ventricular wall thickness. Right ventricular systolic function is moderately reduced. There is normal pulmonary artery systolic pressure. The tricuspid regurgitant velocity is 1.77 m/s, and with an assumed right atrial pressure of 15 mmHg, the estimated right  ventricular systolic pressure is 27.5 mmHg.  Left [facility]: Left atrial size was mildly dilated.  Right [facility]: Right atrial size was severely dilated.  Pericardium: A small pericardial effusion is present. The pericardial effusion is posterior to the left ventricle.  Mitral Valve: The mitral valve is degenerative in appearance. There is mild thickening of the mitral valve leaflet(s). There is mild calcification of the mitral valve leaflet(s). Mild to moderate mitral valve regurgitation, with posteriorly-directed jet. MV peak gradient, 35.8 mmHg. The mean mitral valve gradient is 2.0 mmHg.  Tricuspid Valve: The tricuspid valve is abnormal. Tricuspid valve regurgitation is severe.  Aortic Valve: The aortic valve is tricuspid. There is mild calcification of the aortic valve. There is mild aortic valve annular calcification. Aortic valve regurgitation is mild. Aortic valve sclerosis is present, with no evidence of aortic valve stenosis.  Pulmonic Valve: The pulmonic valve was grossly normal. Pulmonic valve regurgitation is mild to moderate.  Aorta: The aortic root is normal in size and structure and aortic dilatation noted. There is mild dilatation of the ascending aorta, measuring 36 mm.  Pulmonary Artery: Main pulmonary artery dilated.  Venous: The inferior vena cava is dilated in size with less than 50% respiratory variability, suggesting right atrial pressure of 15 mmHg.  IAS/Shunts: No atrial level shunt detected by color flow Doppler.   LEFT VENTRICLE PLAX 2D LVIDd:         3.70 cm   Diastology LVIDs:         2.20 cm   LV e' medial:    8.59 cm/s LV PW:         1.30 cm   LV E/e' medial:  7.9 LV IVS:        1.40 cm   LV e'  lateral:   12.40 cm/s LVOT diam:     1.90 cm   LV E/e' lateral: 5.5 LV SV:         26 LV SV Index:   13 LVOT Area:     2.84 cm   RIGHT VENTRICLE RV S prime:     14.10 cm/s TAPSE (M-mode): 1.4 cm  LEFT ATRIUM           Index        RIGHT ATRIUM            Index LA diam:      5.00 cm 2.52 cm/m   RA Area:     28.00 cm LA Vol (A4C): 72.2 ml 36.41 ml/m  RA Volume:   99.30 ml  50.08 ml/m AORTIC VALVE             PULMONIC VALVE LVOT Vmax:   51.70 cm/s  PR End Diast Vel: 2.29 msec LVOT Vmean:  31.500 cm/s LVOT VTI:    0.090 m  AORTA Ao Root diam: 2.90 cm Ao Asc diam:  3.60 cm  MITRAL VALVE                 TRICUSPID VALVE MV Area (PHT): 3.72 cm      TR Peak grad:   12.5 mmHg MV Area VTI:   0.66 cm      TR Vmax:        177.00 cm/s MV Peak grad:  35.8 mmHg MV Mean grad:  2.0 mmHg      SHUNTS MV Vmax:       2.99 m/s      Systemic VTI:  0.09 m MV Vmean:      62.6 cm/s     Systemic Diam: 1.90 cm MV Decel Time: 204 msec MR Peak grad:   74.3 mmHg MR Mean grad:   50.0 mmHg MR Vmax:        431.00 cm/s MR Vmean:       330.0 cm/s MR PISA:        1.57 cm MR PISA Radius: 0.50 cm MV E velocity: 67.90 cm/s MV A velocity: 72.20 cm/s MV E/A ratio:  0.94  Nona Dell MD Electronically signed by Nona Dell MD Signature Date/Time: 07/03/2022/5:01:28 PM    Final             Other studies Reviewed: Review of the additional studies/records demonstrates: PE protocol CT in December 2023 demonstrates right atrial enlargement with contrast reflux into the hepatic veins  Recent Labs: 01/27/2022: ALT 30; TSH 0.557 07/02/2022: Hemoglobin 12.2; Platelets 230 07/03/2022: B Natriuretic Peptide 519.0 07/04/2022: Magnesium 2.0 07/07/2022: BUN 23; Creatinine, Ser 1.15; Potassium 4.1; Sodium 136   Recent Lipid Panel Lab Results  Component Value Date/Time   CHOL 85 07/03/2022 04:21 AM   CHOL 149 07/04/2021 01:41 PM   TRIG 61 07/03/2022 04:21 AM   HDL 35 (L) 07/03/2022 04:21 AM   HDL 53 07/04/2021 01:41 PM   LDLCALC 38 07/03/2022 04:21 AM   LDLCALC 78 07/04/2021 01:41 PM   LDLCALC 79 04/25/2019 10:53 AM    Risk Assessment/Calculations:     CHA2DS2-VASc Score = 7   This indicates a 11.2% annual risk of stroke. The patient's score is based  upon: CHF History: 1 HTN History: 1 Diabetes History: 0 Stroke History: 2 Vascular Disease History: 0 Age Score: 2 Gender Score: 1              Physical Exam:  VS:  BP 128/88   Pulse 69   Ht 5\' 7"  (1.702 m)   Wt 189 lb 6.4 oz (85.9 kg)   SpO2 97%   BMI 29.66 kg/m    Wt Readings from Last 3 Encounters:  07/14/22 189 lb 6.4 oz (85.9 kg)  07/07/22 188 lb 11.4 oz (85.6 kg)  07/02/22 194 lb 3.2 oz (88.1 kg)    GENERAL:  No apparent distress, AOx3 HEENT:  No carotid bruits, +CV wave, +2 carotid impulses, no scleral icterus CAR: Irregular RR no murmurs, gallops, rubs, or thrills RES:  Clear to auscultation bilaterally ABD:  Soft, nontender, nondistended, positive bowel sounds x 4 VASC:  +2 radial pulses, +2 carotid pulses, palpable pedal pulses NEURO:  CN 2-12 grossly intact; motor and sensory grossly intact PSYCH:  No active depression or anxiety EXT:  Trace edema, ecchymosis, or cyanosis  Signed, Orbie Pyo, MD  07/14/2022 5:13 PM    [facility] [address] [place], [place], [place]  [address] Phone: [phone]; Fax: [phone]   Note:  This document was prepared using Dragon voice recognition software and may include unintentional dictation errors.

## 2022-07-14 NOTE — H&P (Addendum)
Cardiology Admission History and Physical   Patient ID: [name] MRN: [id number]; DOB: 09-18-1940   Admission date: 07/14/2022  PCP:  Theresa Perches, MD   [facility] Providers Cardiologist:  Theresa Bicker, MD   {   Chief Complaint:  SOB   Patient Profile:   Theresa Garcia is a 82 y.o. female with complex past medical history with persistent atrial fibrillation, mitral regurgitation, secondary severe tricuspid regurgitation, moderate RV enlargement, hypertension, osteoporosis, hyperlipidemia, heart failure preserved EF, breast cancer status post chemoradiation, GERD, chronic back pain, anxiety with depression, HSV, PE, who is being seen 07/14/2022 for the evaluation of CHF.  History of Present Illness:   Theresa Garcia with above past medical history who presented to the structural heart team Theresa Garcia office for evaluation of tricuspid regurgitation.  She was felt overly volume overloaded on clinical exam and direct admission to cardiology service was recommended today.   Upon encounter she reports that she was released by [facility] 07/07/2022.  After staying home for few days, she presented to [facility] for difficulty breathing again.  She was admitted there for decompensated heart failure.  She was just released this morning from [facility].  She believes she was diuresed with IV medicine, had a Foley catheter during her stay which was removed this morning.  She was able to urinate once at cardiology office today.  She felt overall lacking improvement of her symptoms.  She reports shortness of breath with exertion, abdominal distention/band-like feeling.  She is not sure if she has significant leg edema, states that she has varicose veins.  She denied any significant chest pain, dizziness, syncope.    Per chart review, she remotely saw Theresa Garcia 2015 for dizziness, reportedly had CTA coronary arteries in [place], [place], on  03/07/07 which demonstrated normal coronaries. She had no exertional angina or equivalent symptoms. No further work up was felt needed.  She was lost to follow-up since.   Echo from 03/19/2017 revealed LVEF 55 to 60%, no RWMA, mild MR regurgiation.  Moderately to severely dilated RV, moderate tricuspid regurgitation.  PA peak pressure was 43 mmHg.  She was admitted for newly discovered acute diastolic heart failure and new onset A-fib 01/2022.  CTA chest negative for PE but suggestive of right-heart dysfunction.  Echo from 01/2022 showed LVEF 60 to 65%, D-shaped left ventricle, consistent with RV volume overload, appears to be normal RV systolic function, RV moderately enlarged, severe LAE, severe RAE, mild MR, severe atrial functional tricuspid valve regurgitation, mild AI, PASP cannot be calculated.  She was diuresed with IV Lasix with good response.  She was discharged on torsemide 40 mg daily, subsequently started on Farxiga 10 mg at follow-up visit.  She was noted with atrial fibrillation with slowed ventricular response, previous home dose metoprolol was discontinued.  She was already on Eliquis due to history of PE in the past, this was continued for anticoagulation.   Repeat Echo 03/11/2022 with LVEF 60 to 65%, no RWMA, indeterminate diastolic parameter, RV volume overload, normal RV systolic function with moderate RV enlargement, RVSP elevated 42.2 mmHg, mild RAE, mild to moderate MR, severe TR due to annular dilatation, trivial AI, aortic sclerosis. Follow-up appointment in February 2024, her torsemide was increased to 60 mg daily.  06/2022 she was hospitalized for acute heart failure preserved EF, diuresed with IV Lasix, repeat echo on 07/03/2022 revealing LVEF 60 to 65%, no RWMA, moderate concentric LVH, indeterminate diastolic parameter, RV volume  overload, moderately reduced RV systolic function, moderately enlarged RV, RVSP 27.5 mmHg, mild LAE, severe RAE, small pericardial effusion, mild to  moderate MR,  severe TR due to annular dilatation, mild AI, aortic sclerosis, mild dilatation of ascending aorta 36 mm.  She was discharged on Lasix 60 mg twice daily, discharge weight was 85 kg.  She was continued home medicine amlodipine and benazepril, Marcelline Deist was stopped due to possible cost issue. She was referred to structural heart team Theresa. Lynnette Garcia for further evaluation of severe secondary TR. She was also arranged for 2 weeks ZIO monitor.  She was also treated for HSV genital outbreak with acyclovir for 7 days during that hospitalization.       Past Medical History:  Diagnosis Date   (HFpEF) heart failure with preserved ejection fraction (HCC)    A-fib (HCC)    Dx: 01/2022   Breast cancer (HCC) 02/24/2000   Bronchitis    Cancer (HCC)    Chronic back pain    Depression    GERD (gastroesophageal reflux disease)    Hematuria    Hyperlipidemia    Hypertension    Insomnia    Kidney stone    Personal history of chemotherapy    Personal history of radiation therapy    Pleural effusion    Pneumonia    Thyroid goiter    Vertigo     Past Surgical History:  Procedure Laterality Date   ABDOMINAL HYSTERECTOMY     BREAST LUMPECTOMY Left 2002   BREAST SURGERY     left   CYSTOSCOPY/RETROGRADE/URETEROSCOPY/STONE EXTRACTION WITH BASKET  01/07/2011   Procedure: CYSTOSCOPY/RETROGRADE/URETEROSCOPY/STONE EXTRACTION WITH BASKET;  Surgeon: Ky Barban;  Location: [facility];  Service: Urology;  Laterality: Left;  Balloon Dilatation; stone given to family per MD   TUBAL LIGATION       Medications Prior to Admission: Prior to Admission medications   Medication Sig Start Date End Date Taking? Authorizing Provider  acetaminophen (TYLENOL) 500 MG tablet Take 1,000 mg by mouth every 6 (six) hours as needed for mild pain or moderate pain.     [provider]  acyclovir (ZOVIRAX) 400 MG tablet Take 400 mg by mouth daily. 06/30/22   [provider]  alendronate (FOSAMAX) 70  MG tablet TAKE 1 TABLET BY MOUTH EVERY 7  DAYS WITH A FULL GLASS OF WATER  ON AN EMPTY STOMACH Patient taking differently: Take 70 mg by mouth once a week. 10/17/21   Theresa Perches, MD  amLODipine-benazepril (LOTREL) 5-40 MG capsule Take 1 capsule by mouth daily.    [provider]  atorvastatin (LIPITOR) 20 MG tablet TAKE 1 TABLET BY MOUTH IN THE  EVENING 04/02/22   Theresa Perches, MD  Calcium Carb-Cholecalciferol (CALTRATE 600+D3) 600-20 MG-MCG TABS Take 1 tablet by mouth daily. Take one tablet by mouth one time daily 01/07/22   Theresa Perches, MD  Elastic Bandages & Supports MISC 1 Package by Does not apply route as directed. 02/25/22   Sharlene Dory, NP  ELIQUIS 5 MG TABS tablet TAKE 1 TABLET BY MOUTH TWICE  DAILY 10/03/21   Theresa Perches, MD  FLUoxetine (PROZAC) 10 MG capsule TAKE 1 CAPSULE BY MOUTH DAILY 10/03/21   Theresa Perches, MD  furosemide (LASIX) 20 MG tablet Take 3 tablets (60 mg total) by mouth 2 (two) times daily. 07/07/22   Johnson, Clanford L, MD  meclizine (ANTIVERT) 25 MG tablet TAKE 1 TABLET BY MOUTH THREE TIMES DAILY AS NEEDED FOR DIZZINESS Patient  taking differently: Take 25 mg by mouth 3 (three) times daily as needed for dizziness. 05/19/21   Theresa Perches, MD  metoprolol tartrate (LOPRESSOR) 25 MG tablet Take 12.5 mg by mouth 2 (two) times daily.    [provider]  Misc. Devices (TOILET SAFETY FRAME) MISC 1 each by Does not apply route daily as needed. 04/13/17   Eustace Moore, MD  ondansetron (ZOFRAN) 4 MG tablet Take 4 mg by mouth as needed. 06/30/22   [provider]  potassium chloride SA (KLOR-CON M) 10 MEQ tablet Take 1 tablet (10 mEq total) by mouth every other day. 07/08/22   Johnson, Clanford L, MD  temazepam (RESTORIL) 30 MG capsule TAKE 1 CAPSULE BY MOUTH EVERY  NIGHT AT BEDTIME 03/05/22   Theresa Perches, MD  vitamin C (ASCORBIC ACID) 500 MG tablet Take 500 mg by mouth daily.    [provider]   Vitamin D, Ergocalciferol, (DRISDOL) 1.25 MG (50000 UNIT) CAPS capsule TAKE 1 CAPSULE BY MOUTH ONCE  WEEKLY 06/04/22   Theresa Perches, MD     Allergies:    Allergies  Allergen Reactions   Meclizine Diarrhea   Penicillins Rash and Other (See Comments)    Has patient had a PCN reaction causing immediate rash, facial/tongue/throat swelling, SOB or lightheadedness with hypotension: no Has patient had a PCN reaction causing severe rash involving mucus membranes or skin necrosis: No Has patient had a PCN reaction that required hospitalization No Has patient had a PCN reaction occurring within the last 10 years: No If all of the above answers are "NO", then may proceed with Cephalosporin use.     Social History:   Social History   Socioeconomic History   Marital status: Divorced    Spouse name: Not on file   Number of children: 2   Years of education: Not on file   Highest education level: Not on file  Occupational History   Not on file  Tobacco Use   Smoking status: Former    Packs/day: 1.00    Years: 5.00    Additional pack years: 0.00    Total pack years: 5.00    Types: Cigarettes    Quit date: 09/07/1980    Years since quitting: 41.8   Smokeless tobacco: Never   Tobacco comments:    quit 25+ years ago  Vaping Use   Vaping Use: Never used  Substance and Sexual Activity   Alcohol use: No    Alcohol/week: 0.0 standard drinks of alcohol   Drug use: No   Sexual activity: Not Currently  Other Topics Concern   Not on file  Social History Narrative   Not on file   Social Determinants of Health   Financial Resource Strain: Low Risk  (10/03/2020)   Overall Financial Resource Strain (CARDIA)    Difficulty of Paying Living Expenses: Not hard at all  Food Insecurity: No Food Insecurity (07/02/2022)   Hunger Vital Sign    Worried About Running Out of Food in the Last Year: Never true    Ran Out of Food in the Last Year: Never true  Transportation Needs: No Transportation  Needs (07/02/2022)   PRAPARE - Administrator, Civil Service (Medical): No    Lack of Transportation (Non-Medical): No  Physical Activity: Inactive (10/03/2020)   Exercise Vital Sign    Days of Exercise per Week: 0 days    Minutes of Exercise per Session: 0 min  Stress: No Stress Concern Present (  10/03/2020)   [employer] [employer] - Occupational Stress Questionnaire    Feeling of Stress : Not at all  Social Connections: Moderately Isolated (10/03/2020)   Social Connection and Isolation Panel [NHANES]    Frequency of Communication with Friends and Family: More than three times a week    Frequency of Social Gatherings with Friends and Family: Twice a week    Attends Religious Services: More than 4 times per year    Active Member of [employer] or Organizations: No    Attends Banker Meetings: Never    Marital Status: Divorced  Catering manager Violence: Not At Risk (07/02/2022)   Humiliation, Afraid, Rape, and Kick questionnaire    Fear of Current or Ex-Partner: No    Emotionally Abused: No    Physically Abused: No    Sexually Abused: No    Family History:   The patient's family history includes Aneurysm in her brother; Bone cancer in her brother; COPD in her father, sister, and sister; Diabetes in her brother and sister; HIV/AIDS in her brother; Heart disease in her brother; Seizures in her mother.    ROS:  Constitutional: Denied fever, chills, malaise, night sweats Eyes: Denied vision change or loss Ears/Nose/Mouth/Throat: Denied ear ache, sore throat, coughing, sinus pain Cardiovascular: see HPI  Respiratory:see HPI  Gastrointestinal:see HPI  Genital/Urinary: Denied dysuria, hematuria, urinary frequency/urgency Musculoskeletal: Denied muscle ache, joint pain, weakness Skin: Denied rash, wound Neuro: Denied headache, dizziness, syncope Psych: history of depression/anxiety  Endocrine: Denied history of diabetes   Physical Exam/Data:    Vitals:   07/14/22 1823  BP: (!) 156/89  Pulse: 74  Resp: 18  Temp: 98.3 F (36.8 C)  TempSrc: Oral  SpO2: 97%   No intake or output data in the 24 hours ending 07/14/22 1956    07/14/2022    3:41 PM 07/07/2022    3:47 AM 07/06/2022    4:31 AM  Last 3 Weights  Weight (lbs) 189 lb 6.4 oz 188 lb 11.4 oz 194 lb 10.7 oz  Weight (kg) 85.911 kg 85.6 kg 88.302 kg     There is no height or weight on file to calculate BMI.   Vitals:  Vitals:   07/14/22 1823  BP: (!) 156/89  Pulse: 74  Resp: 18  Temp: 98.3 F (36.8 C)  SpO2: 97%   General Appearance: In no apparent distress, laying in bed HEENT: Normocephalic, atraumatic.  Neck: Supple, trachea midline, JVD up to jaw with HOB 30 degree  Cardiovascular: Irregularly irregular, no murmur Respiratory: Resting breathing unlabored, lungs sounds with fine crackles bilaterally, no use of accessory muscle, speak full sentences, on room air  Gastrointestinal: Bowel sounds positive, abdomen soft, mildly distended Extremities: Able to move all extremities in bed without difficulty, trace edema of bilateral lower extremity, varicose veins  Musculoskeletal: Normal muscle bulk and tone Skin: Intact, warm, dry. No rashes or petechiae noted in exposed areas.  Neurologic: Alert, oriented to person, place and time.  No evident cognitive deficit, positive memory loss Psychiatric: Normal affect. Mood is appropriate.   EKG: EKG ordered   Relevant CV Studies:   Echocardiogram from 07/03/2022:   1. Left ventricular ejection fraction, by estimation, is 60 to 65%. The  left ventricle has normal function. The left ventricle has no regional  wall motion abnormalities. There is moderate concentric left ventricular  hypertrophy. Left ventricular  diastolic parameters are indeterminate. There is the interventricular  septum is flattened in diastole ('D' shaped left ventricle), consistent  with right ventricular volume overload.   2. Right  ventricular systolic function is moderately reduced. The right  ventricular size is moderately enlarged. There is normal pulmonary artery  systolic pressure. The estimated right ventricular systolic pressure is  27.5 mmHg.   3. Left atrial size was mildly dilated.   4. Right atrial size was severely dilated.   5. A small pericardial effusion is present. The pericardial effusion is  posterior to the left ventricle.   6. The mitral valve is degenerative. Mild to moderate mitral valve  regurgitation.   7. The tricuspid valve is abnormal, incomplete coaptation secondardy to  annular dilatation. Tricuspid valve regurgitation is severe.   8. The aortic valve is tricuspid. There is mild calcification of the  aortic valve. Aortic valve regurgitation is mild. Aortic valve sclerosis  is present, with no evidence of aortic valve stenosis.   9. Aortic dilatation noted. There is mild dilatation of the ascending  aorta, measuring 36 mm.  10. Main pulmonary artery dilated.  11. The inferior vena cava is dilated in size with <50% respiratory  variability, suggesting right atrial pressure of 15 mmHg.   Comparison(s): Prior images reviewed side by side. LVEF normal range at  60-65%. There is moderate RV dysfunction with wide open tricupid  regurgitation in the setting of incomplete leaflet coaptation and annular  dilatation.   Laboratory Data:  High Sensitivity Troponin:  No results for input(s): "TROPONINIHS" in the last 720 hours.    Chemistry Recent Labs  Lab 07/14/22 1856  NA 137  K 4.0  CL 102  CO2 23  GLUCOSE 121*  BUN 12  CREATININE 0.93  CALCIUM 9.8  MG 2.0  GFRNONAA >60  ANIONGAP 12    Recent Labs  Lab 07/14/22 1856  PROT 7.4  ALBUMIN 3.5  AST 21  ALT 11  ALKPHOS 72  BILITOT 1.5*   Lipids No results for input(s): "CHOL", "TRIG", "HDL", "LABVLDL", "LDLCALC", "CHOLHDL" in the last 168 hours. Hematology Recent Labs  Lab 07/14/22 1856  WBC 8.3  RBC 4.50  HGB 13.6   HCT 42.2  MCV 93.8  MCH 30.2  MCHC 32.2  RDW 16.1*  PLT 230   Thyroid No results for input(s): "TSH", "FREET4" in the last 168 hours. BNPNo results for input(s): "BNP", "PROBNP" in the last 168 hours.  DDimer No results for input(s): "DDIMER" in the last 168 hours.   Radiology/Studies:  No results found.   Assessment and Plan:   Acute heart failure with preserved EF/right heart failure Severe secondary tricuspid regurgitation due to annular dilatation/RV enlargement Mild to moderate mitral regurgitation -Presented with gross volume overload during office visit today, sent in as direct admission -Echo recently obtained on 07/03/2022 as above -Will admit to telemetry unit - Will check CBC diff, CMP, Mag, BNP, Trop, lactic acid  - She was just released by [facility] [place] this morning,unable obtain records as it's after hours, she has not bee home, suppose to be on Lasix 60mg  BID from 5/14 discharge, will start IV Lasix 40mg  BID for now, up-tritiate as needed  - Please check strict intake and output, daily weight - GDMT: Home medicine amlodipine-benazepril can be continued for hypertension, Marcelline Deist was stopped due to cost, will need pharmacy assistance tomorrow -Will consult advanced heart failure tomorrow with potential plan for right heart catheterization, NPO after midnight  -Ultimate referral to [facility] for potential tricuspid clip procedure per Theresa Garcia  Persistent atrial fibrillation -This was initially diagnosed in 01/2022, had A-fib with  slowed ventricular response, metoprolol was stopped -Eliquis for history of PE in the past and this was continued for anticoagulation -Will hold Eliquis given potential of right heart catheterization, will start heparin infusion, CHA2DS2-VASc score 7  HTN - PTA amlodipine and benazepril continued   HLD - PTA lipitor continued   Depression - PTA prozac continued   DVT prophylaxis - heparin gtt  AD: DNR/DNI, confirmed at bedside,  patient wishes her brother Harl Bowie to be contacted first if she is not able to make medical decisions independently     Risk Assessment/Risk Scores:    New York Heart Association (NYHA) Functional Class NYHA Class IV  CHA2DS2-VASc Score = 7   This indicates a 11.2% annual risk of stroke. The patient's score is based upon: CHF History: 1 HTN History: 1 Diabetes History: 0 Stroke History: 2 Vascular Disease History: 0 Age Score: 2 Gender Score: 1    Severity of Illness: The appropriate patient status for this patient is INPATIENT. Inpatient status is judged to be reasonable and necessary in order to provide the required intensity of service to ensure the patient's safety. The patient's presenting symptoms, physical exam findings, and initial radiographic and laboratory data in the context of their chronic comorbidities is felt to place them at high risk for further clinical deterioration. Furthermore, it is not anticipated that the patient will be medically stable for discharge from the hospital within 2 midnights of admission.   * I certify that at the point of admission it is my clinical judgment that the patient will require inpatient hospital care spanning beyond 2 midnights from the point of admission due to high intensity of service, high risk for further deterioration and high frequency of surveillance required.*   For questions or updates, please contact [facility] Please consult [email or web address] for contact info under     Signed, Cyndi Bender, NP  07/14/2022 7:56 PM    Personally seen and examined. Agree with above.  82 year old with right-sided heart failure, seen earlier today by Theresa. Lynnette Garcia in the outpatient setting, recently in and out of the hospital secondary to shortness of breath, decompensated diastolic heart failure/RV failure/biventricular failure.  Recent echocardiogram showed EF of 65% with indeterminate diastolic parameters with RV volume overload,  moderately large RV.  Currently she is resting fairly comfortably in bed.  Alert.  Multiple family members including a son from [place] currently here.  Respiratory status seems stable at this point.  Irregularly irregular heart rate.  Minimal lower extremity edema.  Plan will be to have her evaluated by advanced heart failure team tomorrow.  Theresa. Gasper Lloyd, advanced heart failure team, has discussed case.  Ultimately she may require referral to [facility] in [place] for potential tricuspid clip procedure.  We will go ahead and give her 40 mg IV Lasix twice daily for now.  Will plan on right heart catheterization tomorrow.  Make her n.p.o. past midnight.  Holding Eliquis, has persistent atrial fibrillation.  DNR/DNI previously confirmed.  Donato Schultz, MD

## 2022-07-15 ENCOUNTER — Other Ambulatory Visit: Payer: Self-pay

## 2022-07-15 ENCOUNTER — Encounter (HOSPITAL_COMMUNITY): Payer: Self-pay | Admitting: Cardiology

## 2022-07-15 ENCOUNTER — Other Ambulatory Visit (HOSPITAL_COMMUNITY): Payer: Self-pay

## 2022-07-15 ENCOUNTER — Inpatient Hospital Stay: Payer: Medicare Other | Admitting: Family Medicine

## 2022-07-15 ENCOUNTER — Encounter (HOSPITAL_COMMUNITY): Admission: EM | Disposition: A | Discharge status: Home / Self Care | Payer: Self-pay | Attending: Cardiology

## 2022-07-15 ENCOUNTER — Telehealth (HOSPITAL_COMMUNITY): Payer: Self-pay | Admitting: Pharmacy Technician

## 2022-07-15 DIAGNOSIS — I5033 Acute on chronic diastolic (congestive) heart failure: Secondary | ICD-10-CM | POA: Diagnosis not present

## 2022-07-15 DIAGNOSIS — I361 Nonrheumatic tricuspid (valve) insufficiency: Secondary | ICD-10-CM

## 2022-07-15 HISTORY — PX: RIGHT/LEFT HEART CATH AND CORONARY ANGIOGRAPHY: CATH118266

## 2022-07-15 LAB — POCT I-STAT 7, (LYTES, BLD GAS, ICA,H+H)
Acid-base deficit: 4 mmol/L — ABNORMAL HIGH (ref 0.0–2.0)
Bicarbonate: 19.8 mmol/L — ABNORMAL LOW (ref 20.0–28.0)
Calcium, Ion: 0.81 mmol/L — CL (ref 1.15–1.40)
HCT: 31 % — ABNORMAL LOW (ref 36.0–46.0)
Hemoglobin: 10.5 g/dL — ABNORMAL LOW (ref 12.0–15.0)
O2 Saturation: 93 %
Potassium: 2.4 mmol/L — CL (ref 3.5–5.1)
Sodium: 150 mmol/L — ABNORMAL HIGH (ref 135–145)
TCO2: 21 mmol/L — ABNORMAL LOW (ref 22–32)
pCO2 arterial: 32.6 mmHg (ref 32–48)
pH, Arterial: 7.391 (ref 7.35–7.45)
pO2, Arterial: 66 mmHg — ABNORMAL LOW (ref 83–108)

## 2022-07-15 LAB — POCT I-STAT EG7
Acid-Base Excess: 1 mmol/L (ref 0.0–2.0)
Acid-Base Excess: 2 mmol/L (ref 0.0–2.0)
Acid-base deficit: 4 mmol/L — ABNORMAL HIGH (ref 0.0–2.0)
Bicarbonate: 20.6 mmol/L (ref 20.0–28.0)
Bicarbonate: 26.4 mmol/L (ref 20.0–28.0)
Bicarbonate: 27.7 mmol/L (ref 20.0–28.0)
Calcium, Ion: 0.74 mmol/L — CL (ref 1.15–1.40)
Calcium, Ion: 1.17 mmol/L (ref 1.15–1.40)
Calcium, Ion: 1.18 mmol/L (ref 1.15–1.40)
HCT: 31 % — ABNORMAL LOW (ref 36.0–46.0)
HCT: 38 % (ref 36.0–46.0)
HCT: 39 % (ref 36.0–46.0)
Hemoglobin: 10.5 g/dL — ABNORMAL LOW (ref 12.0–15.0)
Hemoglobin: 12.9 g/dL (ref 12.0–15.0)
Hemoglobin: 13.3 g/dL (ref 12.0–15.0)
O2 Saturation: 59 %
O2 Saturation: 59 %
O2 Saturation: 62 %
Potassium: 2.3 mmol/L — CL (ref 3.5–5.1)
Potassium: 3.1 mmol/L — ABNORMAL LOW (ref 3.5–5.1)
Potassium: 3.3 mmol/L — ABNORMAL LOW (ref 3.5–5.1)
Sodium: 142 mmol/L (ref 135–145)
Sodium: 144 mmol/L (ref 135–145)
Sodium: 152 mmol/L — ABNORMAL HIGH (ref 135–145)
TCO2: 22 mmol/L (ref 22–32)
TCO2: 28 mmol/L (ref 22–32)
TCO2: 29 mmol/L (ref 22–32)
pCO2, Ven: 35.4 mmHg — ABNORMAL LOW (ref 44–60)
pCO2, Ven: 43.9 mmHg — ABNORMAL LOW (ref 44–60)
pCO2, Ven: 47.3 mmHg (ref 44–60)
pH, Ven: 7.372 (ref 7.25–7.43)
pH, Ven: 7.377 (ref 7.25–7.43)
pH, Ven: 7.387 (ref 7.25–7.43)
pO2, Ven: 31 mmHg — CL (ref 32–45)
pO2, Ven: 32 mmHg (ref 32–45)
pO2, Ven: 33 mmHg (ref 32–45)

## 2022-07-15 LAB — HEPARIN LEVEL (UNFRACTIONATED)
Heparin Unfractionated: 0.64 IU/mL (ref 0.30–0.70)
Heparin Unfractionated: 0.8 IU/mL — ABNORMAL HIGH (ref 0.30–0.70)

## 2022-07-15 LAB — CBC
HCT: 38 % (ref 36.0–46.0)
Hemoglobin: 12.4 g/dL (ref 12.0–15.0)
MCH: 30.1 pg (ref 26.0–34.0)
MCHC: 32.6 g/dL (ref 30.0–36.0)
MCV: 92.2 fL (ref 80.0–100.0)
Platelets: 201 10*3/uL (ref 150–400)
RBC: 4.12 MIL/uL (ref 3.87–5.11)
RDW: 16.1 % — ABNORMAL HIGH (ref 11.5–15.5)
WBC: 8 10*3/uL (ref 4.0–10.5)
nRBC: 0 % (ref 0.0–0.2)

## 2022-07-15 LAB — BASIC METABOLIC PANEL
Anion gap: 11 (ref 5–15)
BUN: 13 mg/dL (ref 8–23)
CO2: 22 mmol/L (ref 22–32)
Calcium: 9.1 mg/dL (ref 8.9–10.3)
Chloride: 102 mmol/L (ref 98–111)
Creatinine, Ser: 0.91 mg/dL (ref 0.44–1.00)
GFR, Estimated: >60 mL/min (ref >60)
Glucose, Bld: 98 mg/dL (ref 70–99)
Potassium: 3.5 mmol/L (ref 3.5–5.1)
Sodium: 135 mmol/L (ref 135–145)

## 2022-07-15 LAB — APTT: aPTT: 104 seconds — ABNORMAL HIGH (ref 24–36)

## 2022-07-15 SURGERY — RIGHT/LEFT HEART CATH AND CORONARY ANGIOGRAPHY
Anesthesia: LOCAL

## 2022-07-15 MED ORDER — SODIUM CHLORIDE 0.9 % IV SOLN: 250.0000 mL | INTRAVENOUS | Status: DC | PRN

## 2022-07-15 MED ORDER — SODIUM CHLORIDE 0.9% FLUSH
3.0000 mL | Freq: Two times a day (BID) | INTRAVENOUS | Status: DC
  Administered 2022-07-17: 3 mL via INTRAVENOUS

## 2022-07-15 MED ORDER — SODIUM CHLORIDE 0.9 % IV SOLN: INTRAVENOUS | Status: DC

## 2022-07-15 MED ORDER — FENTANYL CITRATE (PF) 100 MCG/2ML IJ SOLN
INTRAMUSCULAR | Status: DC | PRN
  Administered 2022-07-15: 25 ug via INTRAVENOUS

## 2022-07-15 MED ORDER — TEMAZEPAM 15 MG PO CAPS
30.0000 mg | ORAL_CAPSULE | Freq: Every evening | ORAL | Status: DC | PRN
  Administered 2022-07-15 – 2022-07-16 (×2): 30 mg via ORAL
  Filled 2022-07-15 (×3): qty 2

## 2022-07-15 MED ORDER — VERAPAMIL HCL 2.5 MG/ML IV SOLN
INTRAVENOUS | Status: DC | PRN
  Administered 2022-07-15: 10 mL via INTRA_ARTERIAL

## 2022-07-15 MED ORDER — HEPARIN (PORCINE) 25000 UT/250ML-% IV SOLN
1050.0000 [IU]/h | INTRAVENOUS | Status: DC
  Administered 2022-07-16 – 2022-07-17 (×2): 1050 [IU]/h via INTRAVENOUS
  Filled 2022-07-15 (×2): qty 250

## 2022-07-15 MED ORDER — ACETAMINOPHEN 325 MG PO TABS
650.0000 mg | ORAL_TABLET | ORAL | Status: DC | PRN
  Administered 2022-07-16 – 2022-07-17 (×2): 650 mg via ORAL
  Filled 2022-07-15 (×2): qty 2

## 2022-07-15 MED ORDER — HEPARIN SODIUM (PORCINE) 1000 UNIT/ML IJ SOLN
INTRAMUSCULAR | Status: DC | PRN
  Administered 2022-07-15: 4000 [IU] via INTRAVENOUS

## 2022-07-15 MED ORDER — IOHEXOL 350 MG/ML SOLN
INTRAVENOUS | Status: DC | PRN
  Administered 2022-07-15: 35 mL

## 2022-07-15 MED ORDER — LABETALOL HCL 5 MG/ML IV SOLN: 10.0000 mg | INTRAVENOUS | Status: AC | PRN

## 2022-07-15 MED ORDER — LIDOCAINE HCL (PF) 1 % IJ SOLN
INTRAMUSCULAR | Status: DC | PRN
  Administered 2022-07-15: 4 mL

## 2022-07-15 MED ORDER — SODIUM CHLORIDE 0.9% FLUSH: 3.0000 mL | Freq: Two times a day (BID) | INTRAVENOUS | Status: DC

## 2022-07-15 MED ORDER — ASPIRIN 81 MG PO CHEW
81.0000 mg | CHEWABLE_TABLET | ORAL | Status: AC
  Administered 2022-07-15: 81 mg via ORAL
  Filled 2022-07-15: qty 1

## 2022-07-15 MED ORDER — FUROSEMIDE 10 MG/ML IJ SOLN
80.0000 mg | Freq: Two times a day (BID) | INTRAMUSCULAR | Status: DC
  Administered 2022-07-15 – 2022-07-16 (×3): 80 mg via INTRAVENOUS
  Filled 2022-07-15 (×3): qty 8

## 2022-07-15 MED ORDER — HYDRALAZINE HCL 20 MG/ML IJ SOLN: 10.0000 mg | INTRAMUSCULAR | Status: AC | PRN

## 2022-07-15 MED ORDER — FENTANYL CITRATE (PF) 100 MCG/2ML IJ SOLN
INTRAMUSCULAR | Status: AC
  Filled 2022-07-15: qty 2

## 2022-07-15 MED ORDER — SODIUM CHLORIDE 0.9% FLUSH: 3.0000 mL | INTRAVENOUS | Status: DC | PRN

## 2022-07-15 MED ORDER — HEPARIN (PORCINE) IN NACL 1000-0.9 UT/500ML-% IV SOLN
INTRAVENOUS | Status: DC | PRN
  Administered 2022-07-15 (×2): 500 mL

## 2022-07-15 MED ORDER — VERAPAMIL HCL 2.5 MG/ML IV SOLN
INTRAVENOUS | Status: AC
  Filled 2022-07-15: qty 2

## 2022-07-15 MED ORDER — MIDAZOLAM HCL 2 MG/2ML IJ SOLN
INTRAMUSCULAR | Status: AC
  Filled 2022-07-15: qty 2

## 2022-07-15 MED ORDER — AMIODARONE HCL IN DEXTROSE 360-4.14 MG/200ML-% IV SOLN
INTRAVENOUS | Status: AC
  Filled 2022-07-15: qty 200

## 2022-07-15 MED ORDER — POTASSIUM CHLORIDE CRYS ER 20 MEQ PO TBCR: 40.0000 meq | EXTENDED_RELEASE_TABLET | ORAL | Status: DC

## 2022-07-15 MED ORDER — HEPARIN SODIUM (PORCINE) 1000 UNIT/ML IJ SOLN
INTRAMUSCULAR | Status: AC
  Filled 2022-07-15: qty 10

## 2022-07-15 MED ORDER — MIDAZOLAM HCL 2 MG/2ML IJ SOLN
INTRAMUSCULAR | Status: DC | PRN
  Administered 2022-07-15: 1 mg via INTRAVENOUS

## 2022-07-15 MED ORDER — ONDANSETRON HCL 4 MG/2ML IJ SOLN: 4.0000 mg | Freq: Four times a day (QID) | INTRAMUSCULAR | Status: DC | PRN

## 2022-07-15 SURGICAL SUPPLY — 11 items
CATH 5FR JL3.5 JR4 ANG PIG MP (CATHETERS) IMPLANT
CATH SWAN GANZ 7F STRAIGHT (CATHETERS) IMPLANT
DEVICE RAD COMP TR BAND LRG (VASCULAR PRODUCTS) IMPLANT
GLIDESHEATH SLEND SS 6F .021 (SHEATH) IMPLANT
GLIDESHEATH SLENDER 7FR .021G (SHEATH) IMPLANT
GUIDEWIRE INQWIRE 1.5J.035X260 (WIRE) IMPLANT
INQWIRE 1.5J .035X260CM (WIRE) ×1
KIT HEART LEFT (KITS) IMPLANT
PACK CARDIAC CATHETERIZATION (CUSTOM PROCEDURE TRAY) ×1 IMPLANT
SHEATH PROBE COVER 6X72 (BAG) IMPLANT
TRANSDUCER W/STOPCOCK (MISCELLANEOUS) ×1 IMPLANT

## 2022-07-15 NOTE — H&P (View-Only) (Signed)
Advanced Heart Failure Team Consult Note   Primary Physician: Kerri Perches, MD PCP-Cardiologist:  Marjo Bicker, MD  Reason for Consultation: Acute on chronic RV Failure w/ Severe TR   HPI:    Theresa Garcia is seen today for evaluation of acute on chronic RV failure w/ severe TR at the request of Dr. Lynnette Caffey, structural heart team.  82 y/o AAF w/ h/o HFpEF, chronic Rt sided heart failure w/ severe functional TR, mod MR, pulmonary HTN, persistent atrial fibrillation, HTN and h/o breast cancer in 2002 treated w/ chemoradiation. Had remote LHC done in [place] in 2009 that showed normal coronaries.   Had recent admission at [facility] earlier this month for ADHF. Echo showed preserved LVEF and moderately enlarged RV w/ mod systolic dysfunction and severe TR due to annular dilation. In persistent Afib w/ CVR. Was diuresed w/ IV Lasix and referred to structural heart clinic at d/c to discuss treatment options for TR.   Was seen in clinic yesterday by Dr. Lynnette Caffey and noted to be marked volume overloaded/ in ADHF and was direct admitted to [facility] for further management/ w/u and AHF team consultation.   She has been started on IV Lasix. Strict I/Os not being recorded. 2x unmeasured urinary occurrences documented. She denies any current resting dyspnea. Lives at home w/ her sister. Sounds like she is not very active at baseline but has been able to ambulate slowly around her home w/ use of a walker, NYHA Class II-III symptoms. Denies CP. Agreeable to [facility] today. Remains in Afib, V-rates 60s.     Echo 07/03/22 1. Left ventricular ejection fraction, by estimation, is 60 to 65%. The  left ventricle has normal function. The left ventricle has no regional  wall motion abnormalities. There is moderate concentric left ventricular  hypertrophy. Left ventricular  diastolic parameters are indeterminate. There is the interventricular  septum is flattened in diastole ('D' shaped left ventricle),  consistent  with right ventricular volume overload.   2. Right ventricular systolic function is moderately reduced. The right  ventricular size is moderately enlarged. There is normal pulmonary artery  systolic pressure. The estimated right ventricular systolic pressure is  27.5 mmHg.   3. Left atrial size was mildly dilated.   4. Right atrial size was severely dilated.   5. A small pericardial effusion is present. The pericardial effusion is  posterior to the left ventricle.   6. The mitral valve is degenerative. Mild to moderate mitral valve  regurgitation.   7. The tricuspid valve is abnormal, incomplete coaptation secondardy to  annular dilatation. Tricuspid valve regurgitation is severe.   8. The aortic valve is tricuspid. There is mild calcification of the  aortic valve. Aortic valve regurgitation is mild. Aortic valve sclerosis  is present, with no evidence of aortic valve stenosis.   9. Aortic dilatation noted. There is mild dilatation of the ascending  aorta, measuring 36 mm.  10. Main pulmonary artery dilated.  11. The inferior vena cava is dilated in size with <50% respiratory  variability, suggesting right atrial pressure of 15 mmHg.   Review of Systems: [y] = yes, [ ]  = no   General: Weight gain [ ] ; Weight loss [ ] ; Anorexia [ ] ; Fatigue [ ] ; Fever [ ] ; Chills [ ] ; Weakness [ ]   Cardiac: Chest pain/pressure [ ] ; Resting SOB [ ] ; Exertional SOB [ Y]; Orthopnea [ ] ; Pedal Edema [ Y]; Palpitations [ ] ; Syncope [ ] ; Presyncope [ ] ; Paroxysmal nocturnal dyspnea[ ]   Pulmonary: Cough [ ] ; Wheezing[ ] ; Hemoptysis[ ] ; Sputum [ ] ; Snoring [ ]   GI: Vomiting[ ] ; Dysphagia[ ] ; Melena[ ] ; Hematochezia [ ] ; Heartburn[ ] ; Abdominal pain [ ] ; Constipation [ ] ; Diarrhea [ ] ; BRBPR [ ]   GU: Hematuria[ ] ; Dysuria [ ] ; Nocturia[ ]   Vascular: Pain in legs with walking [ ] ; Pain in feet with lying flat [ ] ; Non-healing sores [ ] ; Stroke [ ] ; TIA [ ] ; Slurred speech [ ] ;  Neuro: Headaches[ ] ;  Vertigo[ ] ; Seizures[ ] ; Paresthesias[ ] ;Blurred vision [ ] ; Diplopia [ ] ; Vision changes [ ]   Ortho/Skin: Arthritis [ ] ; Joint pain [ ] ; Muscle pain [ ] ; Joint swelling [ ] ; Back Pain [ ] ; Rash [ ]   Psych: Depression[ ] ; Anxiety[ ]   Heme: Bleeding problems [ ] ; Clotting disorders [ ] ; Anemia [ ]   Endocrine: Diabetes [ ] ; Thyroid dysfunction[ ]   Home Medications Prior to Admission medications   Medication Sig Start Date End Date Taking? Authorizing Provider  acetaminophen (TYLENOL) 500 MG tablet Take 1,000 mg by mouth every 6 (six) hours as needed for mild pain or moderate pain.    Yes [provider]  acyclovir (ZOVIRAX) 400 MG tablet Take 400 mg by mouth daily. 06/30/22  Yes [provider]  alendronate (FOSAMAX) 70 MG tablet TAKE 1 TABLET BY MOUTH EVERY 7  DAYS WITH A FULL GLASS OF WATER  ON AN EMPTY STOMACH Patient taking differently: Take 70 mg by mouth once a week. 10/17/21  Yes Kerri Perches, MD  amLODipine-benazepril (LOTREL) 5-40 MG capsule Take 1 capsule by mouth daily.   Yes [provider]  atorvastatin (LIPITOR) 20 MG tablet TAKE 1 TABLET BY MOUTH IN THE  EVENING 04/02/22  Yes Kerri Perches, MD  Calcium Carb-Cholecalciferol (CALTRATE 600+D3) 600-20 MG-MCG TABS Take 1 tablet by mouth daily. 01/07/22  Yes Kerri Perches, MD  ELIQUIS 5 MG TABS tablet TAKE 1 TABLET BY MOUTH TWICE  DAILY 10/03/21  Yes Kerri Perches, MD  FLUoxetine (PROZAC) 10 MG capsule TAKE 1 CAPSULE BY MOUTH DAILY 10/03/21  Yes Kerri Perches, MD  furosemide (LASIX) 20 MG tablet Take 3 tablets (60 mg total) by mouth 2 (two) times daily. 07/07/22  Yes Johnson, Clanford L, MD  meclizine (ANTIVERT) 25 MG tablet TAKE 1 TABLET BY MOUTH THREE TIMES DAILY AS NEEDED FOR DIZZINESS Patient taking differently: Take 25 mg by mouth 3 (three) times daily as needed for dizziness. 05/19/21  Yes Kerri Perches, MD  metoprolol tartrate (LOPRESSOR) 25 MG tablet Take 12.5 mg by mouth 2  (two) times daily.   Yes [provider]  ondansetron (ZOFRAN) 4 MG tablet Take 4 mg by mouth as needed for vomiting or nausea. 06/30/22  Yes [provider]  potassium chloride SA (KLOR-CON M) 10 MEQ tablet Take 1 tablet (10 mEq total) by mouth every other day. 07/08/22  Yes Johnson, Clanford L, MD  temazepam (RESTORIL) 30 MG capsule TAKE 1 CAPSULE BY MOUTH EVERY  NIGHT AT BEDTIME 03/05/22  Yes Kerri Perches, MD  vitamin C (ASCORBIC ACID) 500 MG tablet Take 500 mg by mouth daily.   Yes [provider]  Vitamin D, Ergocalciferol, (DRISDOL) 1.25 MG (50000 UNIT) CAPS capsule TAKE 1 CAPSULE BY MOUTH ONCE  WEEKLY 06/04/22  Yes Kerri Perches, MD  Elastic Bandages & Supports MISC 1 Package by Does not apply route as directed. 02/25/22   Sharlene Dory, NP  Misc. Devices (TOILET SAFETY FRAME) MISC 1  each by Does not apply route daily as needed. 04/13/17   Eustace Moore, MD    Past Medical History: Past Medical History:  Diagnosis Date   (HFpEF) heart failure with preserved ejection fraction (HCC)    A-fib (HCC)    Dx: 01/2022   Breast cancer (HCC) 02/24/2000   Bronchitis    Cancer (HCC)    CHF (congestive heart failure) (HCC)    Chronic back pain    Depression    Dysrhythmia    GERD (gastroesophageal reflux disease)    Hematuria    Hyperlipidemia    Hypertension    Insomnia    Kidney stone    Personal history of chemotherapy    Personal history of radiation therapy    Pleural effusion    Pneumonia    Thyroid goiter    Vertigo     Past Surgical History: Past Surgical History:  Procedure Laterality Date   ABDOMINAL HYSTERECTOMY     BREAST LUMPECTOMY Left 2002   BREAST SURGERY     left   CYSTOSCOPY/RETROGRADE/URETEROSCOPY/STONE EXTRACTION WITH BASKET  01/07/2011   Procedure: CYSTOSCOPY/RETROGRADE/URETEROSCOPY/STONE EXTRACTION WITH BASKET;  Surgeon: Ky Barban;  Location: [facility];  Service: Urology;  Laterality: Left;  Balloon Dilatation;  stone given to family per MD   TUBAL LIGATION      Family History: Family History  Problem Relation Age of Onset   COPD Father    COPD Sister    COPD Sister    Diabetes Sister    Diabetes Brother    Seizures Mother    Bone cancer Brother    Heart disease Brother    Aneurysm Brother        brain   HIV/AIDS Brother     Social History: Social History   Socioeconomic History   Marital status: Divorced    Spouse name: Not on file   Number of children: 2   Years of education: Not on file   Highest education level: Not on file  Occupational History   Not on file  Tobacco Use   Smoking status: Former    Packs/day: 1.00    Years: 5.00    Additional pack years: 0.00    Total pack years: 5.00    Types: Cigarettes    Quit date: 09/07/1980    Years since quitting: 41.8   Smokeless tobacco: Never   Tobacco comments:    quit 25+ years ago  Vaping Use   Vaping Use: Never used  Substance and Sexual Activity   Alcohol use: No    Alcohol/week: 0.0 standard drinks of alcohol   Drug use: No   Sexual activity: Not Currently  Other Topics Concern   Not on file  Social History Narrative   Not on file   Social Determinants of Health   Financial Resource Strain: Low Risk  (10/03/2020)   Overall Financial Resource Strain (CARDIA)    Difficulty of Paying Living Expenses: Not hard at all  Food Insecurity: No Food Insecurity (07/15/2022)   Hunger Vital Sign    Worried About Running Out of Food in the Last Year: Never true    Ran Out of Food in the Last Year: Never true  Transportation Needs: No Transportation Needs (07/15/2022)   PRAPARE - Administrator, Civil Service (Medical): No    Lack of Transportation (Non-Medical): No  Physical Activity: Inactive (10/03/2020)   Exercise Vital Sign    Days of Exercise per Week: 0 days  Minutes of Exercise per Session: 0 min  Stress: No Stress Concern Present (10/03/2020)   [employer] of Occupational Health -  Occupational Stress Questionnaire    Feeling of Stress : Not at all  Social Connections: Moderately Isolated (10/03/2020)   Social Connection and Isolation Panel [NHANES]    Frequency of Communication with Friends and Family: More than three times a week    Frequency of Social Gatherings with Friends and Family: Twice a week    Attends Religious Services: More than 4 times per year    Active Member of [employer] or Organizations: No    Attends Banker Meetings: Never    Marital Status: Divorced    Allergies:  Allergies  Allergen Reactions   Meclizine Diarrhea   Penicillins Rash and Other (See Comments)    Has patient had a PCN reaction causing immediate rash, facial/tongue/throat swelling, SOB or lightheadedness with hypotension: no Has patient had a PCN reaction causing severe rash involving mucus membranes or skin necrosis: No Has patient had a PCN reaction that required hospitalization No Has patient had a PCN reaction occurring within the last 10 years: No If all of the above answers are "NO", then may proceed with Cephalosporin use.     Objective:    Vital Signs:   Temp:  [98.1 F (36.7 C)-98.4 F (36.9 C)] 98.4 F (36.9 C) (05/22 0730) Pulse Rate:  [61-74] 61 (05/22 0730) Resp:  [16-20] 16 (05/22 0730) BP: (105-156)/(66-89) 105/77 (05/22 0730) SpO2:  [95 %-98 %] 95 % (05/22 0433) Weight:  [85.9 kg] 85.9 kg (05/21 2000)    Weight change: Filed Weights   07/14/22 2000  Weight: 85.9 kg    Intake/Output:   Intake/Output Summary (Last 24 hours) at 07/15/2022 1201 Last data filed at 07/14/2022 2335 Gross per 24 hour  Intake 308.78 ml  Output --  Net 308.78 ml      Physical Exam    General: elderly AAF, slightly fatigued appearing. No resp difficulty HEENT: normal Neck: supple. + prominent V waves. Carotids 2+ bilat; no bruits. No lymphadenopathy or thyromegaly appreciated. Cor: PMI nondisplaced. Irregularly irregular rhythm and rate. 3/6 TR murmur   Lungs: decreased BS at the bases bilaterally  Abdomen: soft, nontender, nondistended. No hepatosplenomegaly. No bruits or masses. Good bowel sounds. Extremities: no cyanosis, clubbing, rash, trace-1 + b/l LE edema Neuro: alert & orientedx3, cranial nerves grossly intact. moves all 4 extremities w/o difficulty. Affect pleasant   Telemetry   Afib 60s   EKG    Afib 69 bpm   Labs   Basic Metabolic Panel: Recent Labs  Lab 07/14/22 1856 07/15/22 0140  NA 137 135  K 4.0 3.5  CL 102 102  CO2 23 22  GLUCOSE 121* 98  BUN 12 13  CREATININE 0.93 0.91  CALCIUM 9.8 9.1  MG 2.0  --     Liver Function Tests: Recent Labs  Lab 07/14/22 1856  AST 21  ALT 11  ALKPHOS 72  BILITOT 1.5*  PROT 7.4  ALBUMIN 3.5   No results for input(s): "LIPASE", "AMYLASE" in the last 168 hours. No results for input(s): "AMMONIA" in the last 168 hours.  CBC: Recent Labs  Lab 07/14/22 1856 07/15/22 0140  WBC 8.3 8.0  NEUTROABS 5.2  --   HGB 13.6 12.4  HCT 42.2 38.0  MCV 93.8 92.2  PLT 230 201    Cardiac Enzymes: No results for input(s): "CKTOTAL", "CKMB", "CKMBINDEX", "TROPONINI" in the last 168 hours.  BNP: BNP (  last 3 results) Recent Labs    07/02/22 1148 07/03/22 0421 07/14/22 1856  BNP 405.0* 519.0* 474.5*    ProBNP (last 3 results) No results for input(s): "PROBNP" in the last 8760 hours.   CBG: No results for input(s): "GLUCAP" in the last 168 hours.  Coagulation Studies: Recent Labs    07/14/22 1856  LABPROT 15.8*  INR 1.2     Imaging   No results found.   Medications:     Current Medications:  amLODipine  5 mg Oral Daily   atorvastatin  20 mg Oral Daily   benazepril  40 mg Oral Daily   diphenhydrAMINE  25 mg Oral Once   FLUoxetine  10 mg Oral Daily   furosemide  80 mg Intravenous BID   hydrocortisone cream   Topical BID   sodium chloride flush  3 mL Intravenous Q12H    Infusions:  sodium chloride     heparin 1,200 Units/hr (07/14/22 2108)       Patient Profile   82 y/o AAF w/ h/o HFpEF, chronic Rt sided heart failure w/ severe functional TR, mod MR, pulmonary HTN, persistent atrial fibrillation, HTN and h/o breast cancer in 2002 treated w/ chemoradiation, admitted w/ ADHF. AHF team asked to assist w/ RHC for further w/u of RV failure and TR.   Assessment/Plan   1. Acute on Chronic HFpEF w/ Prominent RV Dysfunction  - Echo 5/24 60-65%, mod LVH, RV mod enlarged, systolic fx mod reduced, D-shaped LV c/w RV volume overload, severe RAE, severe TR, RVSP only 27 mmHg  - admitted w/ volume overload and NYHA Class III symptoms  - continue IV Lasix 80 mg bid  - plan for [facility] today   2. Severe TR  - functional, Severe RAE + Mod RV enlargement  - recent echo showed incomplete coaptation secondardy to annular dilatation  - Plan R/LHC today  - Plan TEE tomorrow  - Structural Heart Team following  - suspect will need Tricuspid TEER  3. Persistent Atrial Fibrillation  - rate controlled, HR 60s - on Eliquis PTA - covering w/ heparin gtt until completion of invasive procedures   5. Hypertension  - controlled on current regimen   6. Mitral Regurgitation  - MV degenerative on recent echo w/ mild-mod MR  - RHC today  - Plan TEE tomorrow     Length of Stay: 1  Robbie Lis, PA-C  07/15/2022, 12:01 PM  Advanced Heart Failure Team Pager [phone] (M-F; 7a - 5p)  Please contact [facility] for night-coverage after hours (4p -7a ) and weekends on [email or web address]   Patient seen and examined with the above-signed Advanced Practice Provider and/or Housestaff. I personally reviewed laboratory data, imaging studies and relevant notes. I independently examined the patient and formulated the important aspects of the plan. I have edited the note to reflect any of my changes or salient points. I have personally discussed the plan with the patient and/or family.  82 y/o woman with HTN, chronic AF and biatrial enlargement admitted for  further work-up of torrential TR and RV failure.   Has long h/o HTN which she says was controlled. Denies tobacco use, CTD or sleep apnea/snoring.   Echo with EF 60% severe biatrial enlargement RV dilated with mild-mod HV and torrential TR  At baseline NYHA III. Volume status recently improved.   General:  Elderly. No resp difficulty HEENT: normal Neck: supple. JVP to jaw  Carotids 2+ bilat; no bruits. No lymphadenopathy or thryomegaly appreciated. Cor: Iegular rate &  rhythm.2/6 TR. Lungs: clear Abdomen: soft, nontender, nondistended. No hepatosplenomegaly. No bruits or masses. Good bowel sounds. Extremities: no cyanosis, clubbing, rash, edema Neuro: alert & orientedx3, cranial nerves grossly intact. moves all 4 extremities w/o difficulty. Affect pleasant  My suspicion is that she has underlying restrictive CM that has led to progressive biatrial enlargement, AF and now severe TR due to annular dilation.  Will plan R/L cath followed by TEE to further evaluate.   Arvilla Meres, MD  3:16 PM

## 2022-07-15 NOTE — Telephone Encounter (Signed)
Pharmacy Patient Advocate Encounter  Insurance verification completed.    The patient is insured through [employer] Medicare Part D   The patient is currently admitted and ran test claims for the following: Farxiga, Jardiance.  Copays and coinsurance results were relayed to Inpatient clinical team.  

## 2022-07-15 NOTE — Progress Notes (Signed)
Progress Note  Patient Name: Theresa Garcia Date of Encounter: 07/15/2022  Primary Cardiologist: Marjo Bicker, MD   Subjective   Overnight no events. Patient notes that she feels better. LE swelling has improved, SOB has improved  Inpatient Medications    Scheduled Meds:  amLODipine  5 mg Oral Daily   atorvastatin  20 mg Oral Daily   benazepril  40 mg Oral Daily   diphenhydrAMINE  25 mg Oral Once   FLUoxetine  10 mg Oral Daily   furosemide  40 mg Intravenous BID   hydrocortisone cream   Topical BID   sodium chloride flush  3 mL Intravenous Q12H   Continuous Infusions:  sodium chloride     heparin 1,200 Units/hr (07/14/22 2108)   PRN Meds: sodium chloride, acetaminophen, ondansetron (ZOFRAN) IV, sodium chloride flush   Vital Signs    Vitals:   07/14/22 1823 07/14/22 2000 07/14/22 2026 07/15/22 0433  BP: (!) 156/89  123/87 120/66  Pulse: 74  72 64  Resp: 18  18 20   Temp: 98.3 F (36.8 C)  98.1 F (36.7 C)   TempSrc: Oral  Oral Oral  SpO2: 97%  98% 95%  Weight:  85.9 kg    Height:  5\' 7"  (1.702 m)      Intake/Output Summary (Last 24 hours) at 07/15/2022 1610 Last data filed at 07/14/2022 2335 Gross per 24 hour  Intake 308.78 ml  Output --  Net 308.78 ml   Filed Weights   07/14/22 2000  Weight: 85.9 kg    Telemetry    AF rate controlled - Personally Reviewed  ECG    Atrial fibrillation rate 70 low voltage ECG - Personally Reviewed  Physical Exam   Gen: no distress Neck: Prominent V wave with HJR at 45 degrees, nadir of V wave is relatively low Cardiac: No Rubs or Gallops, systolic murmur, IRIR rhythm +2 radial pulses Respiratory: Clear to auscultation bilaterally, normal effort, normal  respiratory rate GI: Soft, nontender, non-distended  MS: No  edema;  moves all extremities Integument: Skin feels warm Neuro:  At time of evaluation, alert and oriented to person/place/time/situation  Psych: Normal affect, patient feels  well   Labs    Chemistry Recent Labs  Lab 07/14/22 1856 07/15/22 0140  NA 137 135  K 4.0 3.5  CL 102 102  CO2 23 22  GLUCOSE 121* 98  BUN 12 13  CREATININE 0.93 0.91  CALCIUM 9.8 9.1  PROT 7.4  --   ALBUMIN 3.5  --   AST 21  --   ALT 11  --   ALKPHOS 72  --   BILITOT 1.5*  --   GFRNONAA >60 >60  ANIONGAP 12 11     Hematology Recent Labs  Lab 07/14/22 1856 07/15/22 0140  WBC 8.3 8.0  RBC 4.50 4.12  HGB 13.6 12.4  HCT 42.2 38.0  MCV 93.8 92.2  MCH 30.2 30.1  MCHC 32.2 32.6  RDW 16.1* 16.1*  PLT 230 201    Cardiac EnzymesNo results for input(s): "TROPONINI" in the last 168 hours. No results for input(s): "TROPIPOC" in the last 168 hours.   BNP Recent Labs  Lab 07/14/22 1856  BNP 474.5*     DDimer No results for input(s): "DDIMER" in the last 168 hours.   Radiology    No results found.  Patient Profile     82 y.o. female with symptomatic severe TR  Assessment & Plan     Acute on  chronic diastolic heart failure: - related to torrential tricuspid regurgitation, functional in nature, with RV maximal basal dimension 54 mm on 2023 CTPE -NYHA IV, Stage C, hypervolemic - planned for LHC/RHC this admission; diuresis to euvolemia, and outpatient evaluation for Tri-Clip vs Evoque - Primary cardiologist has discussed this with AHF team - will diuresis with 80 IV lasix BID X2 doses - discussed both possible structural procedures with patient; her family now lives in [place] and she has logistics questions, I think she would be appropriate for cath today and feels well.  Will collaborate with AHF team and if no plans for cath today, plan for tomorrow and give diet  Persistent Atrial fibrillation: - on heparin until post procedural, rate controlled without procecural therapy  Hx of PE - on heparin until post procedural  HTN - controlled on benazepril and norvasc, continue current medication  HLD Breast cancer with prior chemoradiation - LDL goal < 70,  continue statin    We are primary: DVT PPX Diet: NPO for potential RHC 07/15/22 vs when euvolemic Code: DNR/DNI    For questions or updates, please contact [facility] Please consult [email or web address] for contact info under Cardiology/STEMI.      Riley Lam, MD FASE [facility] Cardiologist [facility]  [address] [place], [address] [place], [place] [address] [phone]  7:29 AM

## 2022-07-15 NOTE — Progress Notes (Signed)
ANTICOAGULATION CONSULT NOTE  Pharmacy Consult for Heparin infusion Indication: atrial fibrillation  Allergies  Allergen Reactions   Meclizine Diarrhea   Penicillins Rash and Other (See Comments)    Has patient had a PCN reaction causing immediate rash, facial/tongue/throat swelling, SOB or lightheadedness with hypotension: no Has patient had a PCN reaction causing severe rash involving mucus membranes or skin necrosis: No Has patient had a PCN reaction that required hospitalization No Has patient had a PCN reaction occurring within the last 10 years: No If all of the above answers are "NO", then may proceed with Cephalosporin use.     Patient Measurements: Height: 5\' 7"  (170.2 cm) Weight: 85.9 kg (189 lb 6.4 oz) IBW/kg (Calculated) : 61.6 Heparin Dosing Weight: 79.7 kg  Vital Signs: Temp: 98.1 F (36.7 C) (05/21 2026) Temp Source: Oral (05/21 2026) BP: 123/87 (05/21 2026) Pulse Rate: 72 (05/21 2026)  Labs: Recent Labs    07/14/22 1856 07/14/22 1914 07/15/22 0140  HGB 13.6  --  12.4  HCT 42.2  --  38.0  PLT 230  --  201  APTT  --   --  104*  LABPROT 15.8*  --   --   INR 1.2  --   --   HEPARINUNFRC  --   --  0.64  CREATININE 0.93  --  0.91  TROPONINIHS  --  11  --      Estimated Creatinine Clearance: 56.4 mL/min (by C-G formula based on SCr of 0.91 mg/dL).   Medical History: Past Medical History:  Diagnosis Date   (HFpEF) heart failure with preserved ejection fraction (HCC)    A-fib (HCC)    Dx: 01/2022   Breast cancer (HCC) 02/24/2000   Bronchitis    Cancer (HCC)    Chronic back pain    Depression    GERD (gastroesophageal reflux disease)    Hematuria    Hyperlipidemia    Hypertension    Insomnia    Kidney stone    Personal history of chemotherapy    Personal history of radiation therapy    Pleural effusion    Pneumonia    Thyroid goiter    Vertigo     Medications:  Medications Prior to Admission  Medication Sig Dispense Refill Last Dose    acetaminophen (TYLENOL) 500 MG tablet Take 1,000 mg by mouth every 6 (six) hours as needed for mild pain or moderate pain.    Past Week   acyclovir (ZOVIRAX) 400 MG tablet Take 400 mg by mouth daily.   Past Week   alendronate (FOSAMAX) 70 MG tablet TAKE 1 TABLET BY MOUTH EVERY 7  DAYS WITH A FULL GLASS OF WATER  ON AN EMPTY STOMACH (Patient taking differently: Take 70 mg by mouth once a week.) 12 tablet 3 Past Week   amLODipine-benazepril (LOTREL) 5-40 MG capsule Take 1 capsule by mouth daily.   Past Week   atorvastatin (LIPITOR) 20 MG tablet TAKE 1 TABLET BY MOUTH IN THE  EVENING 90 tablet 3 Past Week   Calcium Carb-Cholecalciferol (CALTRATE 600+D3) 600-20 MG-MCG TABS Take 1 tablet by mouth daily. 60 tablet 11 Past Week   ELIQUIS 5 MG TABS tablet TAKE 1 TABLET BY MOUTH TWICE  DAILY 180 tablet 3 Past Week   FLUoxetine (PROZAC) 10 MG capsule TAKE 1 CAPSULE BY MOUTH DAILY 90 capsule 3 Past Week   furosemide (LASIX) 20 MG tablet Take 3 tablets (60 mg total) by mouth 2 (two) times daily. 180 tablet 1 Past Week  meclizine (ANTIVERT) 25 MG tablet TAKE 1 TABLET BY MOUTH THREE TIMES DAILY AS NEEDED FOR DIZZINESS (Patient taking differently: Take 25 mg by mouth 3 (three) times daily as needed for dizziness.) 30 tablet 0 Past Week   metoprolol tartrate (LOPRESSOR) 25 MG tablet Take 12.5 mg by mouth 2 (two) times daily.   Past Week   ondansetron (ZOFRAN) 4 MG tablet Take 4 mg by mouth as needed for vomiting or nausea.   07/14/2022   potassium chloride SA (KLOR-CON M) 10 MEQ tablet Take 1 tablet (10 mEq total) by mouth every other day. 15 tablet 1 Past Week   temazepam (RESTORIL) 30 MG capsule TAKE 1 CAPSULE BY MOUTH EVERY  NIGHT AT BEDTIME 90 capsule 3 Past Week   vitamin C (ASCORBIC ACID) 500 MG tablet Take 500 mg by mouth daily.   Past Week   Vitamin D, Ergocalciferol, (DRISDOL) 1.25 MG (50000 UNIT) CAPS capsule TAKE 1 CAPSULE BY MOUTH ONCE  WEEKLY 13 capsule 3 Past Week   Elastic Bandages & Supports MISC  1 Package by Does not apply route as directed. 1 Package 0    Misc. Devices (TOILET SAFETY FRAME) MISC 1 each by Does not apply route daily as needed. 1 each 0     Assessment: 82 yo F presents to ED from cardiology clinic for acute HF exacerbation with volume overload. Pt has a history of persistent Afib and takes apixaban 5mg  BID prior to admission. Patient reports last dose of apixaban was taken on 07/07/22, but she also admits she has been confused lately. Pharmacy has been consulted to dose heparin infusion for Afib in anticipation of RHC on 5/22.  CBC wnl  No s/sx of bleeding noted  5/22 AM update:  Heparin level therapeutic DC further aPTT's  Goal of Therapy:  Heparin level 0.3-0.7 units/mL Monitor platelets by anticoagulation protocol: Yes   Plan:  Cont heparin 1200 units/hr 1200 heparin level  Abran Duke, PharmD, BCPS Clinical Pharmacist Phone: [phone]

## 2022-07-15 NOTE — Interval H&P Note (Signed)
History and Physical Interval Note:  07/15/2022 3:17 PM  Theresa Garcia  has presented today for surgery, with the diagnosis of heart failure.  The various methods of treatment have been discussed with the patient and family. After consideration of risks, benefits and other options for treatment, the patient has consented to  Procedure(s): RIGHT/LEFT HEART CATH AND CORONARY ANGIOGRAPHY (N/A) as a surgical intervention.  The patient's history has been reviewed, patient examined, no change in status, stable for surgery.  I have reviewed the patient's chart and labs.  Questions were answered to the patient's satisfaction.     Sigrid Schwebach

## 2022-07-15 NOTE — TOC Benefit Eligibility Note (Signed)
Patient Product/process development scientist completed.    The patient is currently admitted and upon discharge could be taking Jardiance 10 mg.  The current 30 day co-pay is $31.80.   The patient is currently admitted and upon discharge could be taking Farxiga 10 mg.  Not Covered  The patient is insured through [employer]   This test claim was processed through [facility]- copay amounts may vary at other pharmacies due to pharmacy/plan contracts, or as the patient moves through the different stages of their insurance plan.  Roland Earl, CPHT Pharmacy Patient Advocate Specialist [facility] Patient Advocate Team Direct Number: [phone]  Fax: [phone]

## 2022-07-15 NOTE — Consult Note (Addendum)
  Advanced Heart Failure Team Consult Note   Primary Physician: Simpson, Margaret E, MD PCP-Cardiologist:  Vishnu P Mallipeddi, MD  Reason for Consultation: Acute on chronic RV Failure w/ Severe TR   HPI:    Theresa Garcia is seen today for evaluation of acute on chronic RV failure w/ severe TR at the request of Dr. Thukkani, structural heart team.  81 y/o AAF w/ h/o HFpEF, chronic Rt sided heart failure w/ severe functional TR, mod MR, pulmonary HTN, persistent atrial fibrillation, HTN and h/o breast cancer in 2002 treated w/ chemoradiation. Had remote LHC done in [place] in 2009 that showed normal coronaries.   Had recent admission at [facility] earlier this month for ADHF. Echo showed preserved LVEF and moderately enlarged RV w/ mod systolic dysfunction and severe TR due to annular dilation. In persistent Afib w/ CVR. Was diuresed w/ IV Lasix and referred to structural heart clinic at d/c to discuss treatment options for TR.   Was seen in clinic yesterday by Dr. Thukkani and noted to be marked volume overloaded/ in ADHF and was direct admitted to [facility] for further management/ w/u and AHF team consultation.   She has been started on IV Lasix. Strict I/Os not being recorded. 2x unmeasured urinary occurrences documented. She denies any current resting dyspnea. Lives at home w/ her sister. Sounds like she is not very active at baseline but has been able to ambulate slowly around her home w/ use of a walker, NYHA Class II-III symptoms. Denies CP. Agreeable to R/LHC today. Remains in Afib, V-rates 60s.     Echo 07/03/22 1. Left ventricular ejection fraction, by estimation, is 60 to 65%. The  left ventricle has normal function. The left ventricle has no regional  wall motion abnormalities. There is moderate concentric left ventricular  hypertrophy. Left ventricular  diastolic parameters are indeterminate. There is the interventricular  septum is flattened in diastole ('D' shaped left ventricle),  consistent  with right ventricular volume overload.   2. Right ventricular systolic function is moderately reduced. The right  ventricular size is moderately enlarged. There is normal pulmonary artery  systolic pressure. The estimated right ventricular systolic pressure is  27.5 mmHg.   3. Left atrial size was mildly dilated.   4. Right atrial size was severely dilated.   5. A small pericardial effusion is present. The pericardial effusion is  posterior to the left ventricle.   6. The mitral valve is degenerative. Mild to moderate mitral valve  regurgitation.   7. The tricuspid valve is abnormal, incomplete coaptation secondardy to  annular dilatation. Tricuspid valve regurgitation is severe.   8. The aortic valve is tricuspid. There is mild calcification of the  aortic valve. Aortic valve regurgitation is mild. Aortic valve sclerosis  is present, with no evidence of aortic valve stenosis.   9. Aortic dilatation noted. There is mild dilatation of the ascending  aorta, measuring 36 mm.  10. Main pulmonary artery dilated.  11. The inferior vena cava is dilated in size with <50% respiratory  variability, suggesting right atrial pressure of 15 mmHg.   Review of Systems: [y] = yes, [ ] = no   General: Weight gain [ ]; Weight loss [ ]; Anorexia [ ]; Fatigue [ ]; Fever [ ]; Chills [ ]; Weakness [ ]  Cardiac: Chest pain/pressure [ ]; Resting SOB [ ]; Exertional SOB [ Y]; Orthopnea [ ]; Pedal Edema [ Y]; Palpitations [ ]; Syncope [ ]; Presyncope [ ]; Paroxysmal nocturnal dyspnea[ ]    Pulmonary: Cough [ ]; Wheezing[ ]; Hemoptysis[ ]; Sputum [ ]; Snoring [ ]  GI: Vomiting[ ]; Dysphagia[ ]; Melena[ ]; Hematochezia [ ]; Heartburn[ ]; Abdominal pain [ ]; Constipation [ ]; Diarrhea [ ]; BRBPR [ ]  GU: Hematuria[ ]; Dysuria [ ]; Nocturia[ ]  Vascular: Pain in legs with walking [ ]; Pain in feet with lying flat [ ]; Non-healing sores [ ]; Stroke [ ]; TIA [ ]; Slurred speech [ ];  Neuro: Headaches[ ];  Vertigo[ ]; Seizures[ ]; Paresthesias[ ];Blurred vision [ ]; Diplopia [ ]; Vision changes [ ]  Ortho/Skin: Arthritis [ ]; Joint pain [ ]; Muscle pain [ ]; Joint swelling [ ]; Back Pain [ ]; Rash [ ]  Psych: Depression[ ]; Anxiety[ ]  Heme: Bleeding problems [ ]; Clotting disorders [ ]; Anemia [ ]  Endocrine: Diabetes [ ]; Thyroid dysfunction[ ]  Home Medications Prior to Admission medications   Medication Sig Start Date End Date Taking? Authorizing Provider  acetaminophen (TYLENOL) 500 MG tablet Take 1,000 mg by mouth every 6 (six) hours as needed for mild pain or moderate pain.    Yes [provider]  acyclovir (ZOVIRAX) 400 MG tablet Take 400 mg by mouth daily. 06/30/22  Yes [provider]  alendronate (FOSAMAX) 70 MG tablet TAKE 1 TABLET BY MOUTH EVERY 7  DAYS WITH A FULL GLASS OF WATER  ON AN EMPTY STOMACH Patient taking differently: Take 70 mg by mouth once a week. 10/17/21  Yes Simpson, Margaret E, MD  amLODipine-benazepril (LOTREL) 5-40 MG capsule Take 1 capsule by mouth daily.   Yes [provider]  atorvastatin (LIPITOR) 20 MG tablet TAKE 1 TABLET BY MOUTH IN THE  EVENING 04/02/22  Yes Simpson, Margaret E, MD  Calcium Carb-Cholecalciferol (CALTRATE 600+D3) 600-20 MG-MCG TABS Take 1 tablet by mouth daily. 01/07/22  Yes Simpson, Margaret E, MD  ELIQUIS 5 MG TABS tablet TAKE 1 TABLET BY MOUTH TWICE  DAILY 10/03/21  Yes Simpson, Margaret E, MD  FLUoxetine (PROZAC) 10 MG capsule TAKE 1 CAPSULE BY MOUTH DAILY 10/03/21  Yes Simpson, Margaret E, MD  furosemide (LASIX) 20 MG tablet Take 3 tablets (60 mg total) by mouth 2 (two) times daily. 07/07/22  Yes Johnson, Clanford L, MD  meclizine (ANTIVERT) 25 MG tablet TAKE 1 TABLET BY MOUTH THREE TIMES DAILY AS NEEDED FOR DIZZINESS Patient taking differently: Take 25 mg by mouth 3 (three) times daily as needed for dizziness. 05/19/21  Yes Simpson, Margaret E, MD  metoprolol tartrate (LOPRESSOR) 25 MG tablet Take 12.5 mg by mouth 2  (two) times daily.   Yes [provider]  ondansetron (ZOFRAN) 4 MG tablet Take 4 mg by mouth as needed for vomiting or nausea. 06/30/22  Yes [provider]  potassium chloride SA (KLOR-CON M) 10 MEQ tablet Take 1 tablet (10 mEq total) by mouth every other day. 07/08/22  Yes Johnson, Clanford L, MD  temazepam (RESTORIL) 30 MG capsule TAKE 1 CAPSULE BY MOUTH EVERY  NIGHT AT BEDTIME 03/05/22  Yes Simpson, Margaret E, MD  vitamin C (ASCORBIC ACID) 500 MG tablet Take 500 mg by mouth daily.   Yes [provider]  Vitamin D, Ergocalciferol, (DRISDOL) 1.25 MG (50000 UNIT) CAPS capsule TAKE 1 CAPSULE BY MOUTH ONCE  WEEKLY 06/04/22  Yes Simpson, Margaret E, MD  Elastic Bandages & Supports MISC 1 Package by Does not apply route as directed. 02/25/22   Peck, Elizabeth, NP  Misc. Devices (TOILET SAFETY FRAME) MISC 1   each by Does not apply route daily as needed. 04/13/17   Nelson, Yvonne Sue, MD    Past Medical History: Past Medical History:  Diagnosis Date   (HFpEF) heart failure with preserved ejection fraction (HCC)    A-fib (HCC)    Dx: 01/2022   Breast cancer (HCC) 02/24/2000   Bronchitis    Cancer (HCC)    CHF (congestive heart failure) (HCC)    Chronic back pain    Depression    Dysrhythmia    GERD (gastroesophageal reflux disease)    Hematuria    Hyperlipidemia    Hypertension    Insomnia    Kidney stone    Personal history of chemotherapy    Personal history of radiation therapy    Pleural effusion    Pneumonia    Thyroid goiter    Vertigo     Past Surgical History: Past Surgical History:  Procedure Laterality Date   ABDOMINAL HYSTERECTOMY     BREAST LUMPECTOMY Left 2002   BREAST SURGERY     left   CYSTOSCOPY/RETROGRADE/URETEROSCOPY/STONE EXTRACTION WITH BASKET  01/07/2011   Procedure: CYSTOSCOPY/RETROGRADE/URETEROSCOPY/STONE EXTRACTION WITH BASKET;  Surgeon: Mohammad I Javaid;  Location: [facility];  Service: Urology;  Laterality: Left;  Balloon Dilatation;  stone given to family per MD   TUBAL LIGATION      Family History: Family History  Problem Relation Age of Onset   COPD Father    COPD Sister    COPD Sister    Diabetes Sister    Diabetes Brother    Seizures Mother    Bone cancer Brother    Heart disease Brother    Aneurysm Brother        brain   HIV/AIDS Brother     Social History: Social History   Socioeconomic History   Marital status: Divorced    Spouse name: Not on file   Number of children: 2   Years of education: Not on file   Highest education level: Not on file  Occupational History   Not on file  Tobacco Use   Smoking status: Former    Packs/day: 1.00    Years: 5.00    Additional pack years: 0.00    Total pack years: 5.00    Types: Cigarettes    Quit date: 09/07/1980    Years since quitting: 41.8   Smokeless tobacco: Never   Tobacco comments:    quit 25+ years ago  Vaping Use   Vaping Use: Never used  Substance and Sexual Activity   Alcohol use: No    Alcohol/week: 0.0 standard drinks of alcohol   Drug use: No   Sexual activity: Not Currently  Other Topics Concern   Not on file  Social History Narrative   Not on file   Social Determinants of Health   Financial Resource Strain: Low Risk  (10/03/2020)   Overall Financial Resource Strain (CARDIA)    Difficulty of Paying Living Expenses: Not hard at all  Food Insecurity: No Food Insecurity (07/15/2022)   Hunger Vital Sign    Worried About Running Out of Food in the Last Year: Never true    Ran Out of Food in the Last Year: Never true  Transportation Needs: No Transportation Needs (07/15/2022)   PRAPARE - Transportation    Lack of Transportation (Medical): No    Lack of Transportation (Non-Medical): No  Physical Activity: Inactive (10/03/2020)   Exercise Vital Sign    Days of Exercise per Week: 0 days      Minutes of Exercise per Session: 0 min  Stress: No Stress Concern Present (10/03/2020)   [employer] -  Occupational Stress Questionnaire    Feeling of Stress : Not at all  Social Connections: Moderately Isolated (10/03/2020)   Social Connection and Isolation Panel [NHANES]    Frequency of Communication with Friends and Family: More than three times a week    Frequency of Social Gatherings with Friends and Family: Twice a week    Attends Religious Services: More than 4 times per year    Active Member of Clubs or Organizations: No    Attends Club or Organization Meetings: Never    Marital Status: Divorced    Allergies:  Allergies  Allergen Reactions   Meclizine Diarrhea   Penicillins Rash and Other (See Comments)    Has patient had a PCN reaction causing immediate rash, facial/tongue/throat swelling, SOB or lightheadedness with hypotension: no Has patient had a PCN reaction causing severe rash involving mucus membranes or skin necrosis: No Has patient had a PCN reaction that required hospitalization No Has patient had a PCN reaction occurring within the last 10 years: No If all of the above answers are "NO", then may proceed with Cephalosporin use.     Objective:    Vital Signs:   Temp:  [98.1 F (36.7 C)-98.4 F (36.9 C)] 98.4 F (36.9 C) (05/22 0730) Pulse Rate:  [61-74] 61 (05/22 0730) Resp:  [16-20] 16 (05/22 0730) BP: (105-156)/(66-89) 105/77 (05/22 0730) SpO2:  [95 %-98 %] 95 % (05/22 0433) Weight:  [85.9 kg] 85.9 kg (05/21 2000)    Weight change: Filed Weights   07/14/22 2000  Weight: 85.9 kg    Intake/Output:   Intake/Output Summary (Last 24 hours) at 07/15/2022 1201 Last data filed at 07/14/2022 2335 Gross per 24 hour  Intake 308.78 ml  Output --  Net 308.78 ml      Physical Exam    General: elderly AAF, slightly fatigued appearing. No resp difficulty HEENT: normal Neck: supple. + prominent V waves. Carotids 2+ bilat; no bruits. No lymphadenopathy or thyromegaly appreciated. Cor: PMI nondisplaced. Irregularly irregular rhythm and rate. 3/6 TR murmur   Lungs: decreased BS at the bases bilaterally  Abdomen: soft, nontender, nondistended. No hepatosplenomegaly. No bruits or masses. Good bowel sounds. Extremities: no cyanosis, clubbing, rash, trace-1 + b/l LE edema Neuro: alert & orientedx3, cranial nerves grossly intact. moves all 4 extremities w/o difficulty. Affect pleasant   Telemetry   Afib 60s   EKG    Afib 69 bpm   Labs   Basic Metabolic Panel: Recent Labs  Lab 07/14/22 1856 07/15/22 0140  NA 137 135  K 4.0 3.5  CL 102 102  CO2 23 22  GLUCOSE 121* 98  BUN 12 13  CREATININE 0.93 0.91  CALCIUM 9.8 9.1  MG 2.0  --     Liver Function Tests: Recent Labs  Lab 07/14/22 1856  AST 21  ALT 11  ALKPHOS 72  BILITOT 1.5*  PROT 7.4  ALBUMIN 3.5   No results for input(s): "LIPASE", "AMYLASE" in the last 168 hours. No results for input(s): "AMMONIA" in the last 168 hours.  CBC: Recent Labs  Lab 07/14/22 1856 07/15/22 0140  WBC 8.3 8.0  NEUTROABS 5.2  --   HGB 13.6 12.4  HCT 42.2 38.0  MCV 93.8 92.2  PLT 230 201    Cardiac Enzymes: No results for input(s): "CKTOTAL", "CKMB", "CKMBINDEX", "TROPONINI" in the last 168 hours.  BNP: BNP (  last 3 results) Recent Labs    07/02/22 1148 07/03/22 0421 07/14/22 1856  BNP 405.0* 519.0* 474.5*    ProBNP (last 3 results) No results for input(s): "PROBNP" in the last 8760 hours.   CBG: No results for input(s): "GLUCAP" in the last 168 hours.  Coagulation Studies: Recent Labs    07/14/22 1856  LABPROT 15.8*  INR 1.2     Imaging   No results found.   Medications:     Current Medications:  amLODipine  5 mg Oral Daily   atorvastatin  20 mg Oral Daily   benazepril  40 mg Oral Daily   diphenhydrAMINE  25 mg Oral Once   FLUoxetine  10 mg Oral Daily   furosemide  80 mg Intravenous BID   hydrocortisone cream   Topical BID   sodium chloride flush  3 mL Intravenous Q12H    Infusions:  sodium chloride     heparin 1,200 Units/hr (07/14/22 2108)       Patient Profile   81 y/o AAF w/ h/o HFpEF, chronic Rt sided heart failure w/ severe functional TR, mod MR, pulmonary HTN, persistent atrial fibrillation, HTN and h/o breast cancer in 2002 treated w/ chemoradiation, admitted w/ ADHF. AHF team asked to assist w/ RHC for further w/u of RV failure and TR.   Assessment/Plan   1. Acute on Chronic HFpEF w/ Prominent RV Dysfunction  - Echo 5/24 60-65%, mod LVH, RV mod enlarged, systolic fx mod reduced, D-shaped LV c/w RV volume overload, severe RAE, severe TR, RVSP only 27 mmHg  - admitted w/ volume overload and NYHA Class III symptoms  - continue IV Lasix 80 mg bid  - plan for R/LHC today   2. Severe TR  - functional, Severe RAE + Mod RV enlargement  - recent echo showed incomplete coaptation secondardy to annular dilatation  - Plan R/LHC today  - Plan TEE tomorrow  - Structural Heart Team following  - suspect will need Tricuspid TEER  3. Persistent Atrial Fibrillation  - rate controlled, HR 60s - on Eliquis PTA - covering w/ heparin gtt until completion of invasive procedures   5. Hypertension  - controlled on current regimen   6. Mitral Regurgitation  - MV degenerative on recent echo w/ mild-mod MR  - RHC today  - Plan TEE tomorrow     Length of Stay: 1  Brittainy Simmons, PA-C  07/15/2022, 12:01 PM  Advanced Heart Failure Team Pager [phone] (M-F; 7a - 5p)  Please contact [facility] for night-coverage after hours (4p -7a ) and weekends on [email or web address]   Patient seen and examined with the above-signed Advanced Practice Provider and/or Housestaff. I personally reviewed laboratory data, imaging studies and relevant notes. I independently examined the patient and formulated the important aspects of the plan. I have edited the note to reflect any of my changes or salient points. I have personally discussed the plan with the patient and/or family.  81 y/o woman with HTN, chronic AF and biatrial enlargement admitted for  further work-up of torrential TR and RV failure.   Has long h/o HTN which she says was controlled. Denies tobacco use, CTD or sleep apnea/snoring.   Echo with EF 60% severe biatrial enlargement RV dilated with mild-mod HV and torrential TR  At baseline NYHA III. Volume status recently improved.   General:  Elderly. No resp difficulty HEENT: normal Neck: supple. JVP to jaw  Carotids 2+ bilat; no bruits. No lymphadenopathy or thryomegaly appreciated. Cor: Iegular rate &   rhythm.2/6 TR. Lungs: clear Abdomen: soft, nontender, nondistended. No hepatosplenomegaly. No bruits or masses. Good bowel sounds. Extremities: no cyanosis, clubbing, rash, edema Neuro: alert & orientedx3, cranial nerves grossly intact. moves all 4 extremities w/o difficulty. Affect pleasant  My suspicion is that she has underlying restrictive CM that has led to progressive biatrial enlargement, AF and now severe TR due to annular dilation.  Will plan R/L cath followed by TEE to further evaluate.   Rumor Sun, MD  3:16 PM   

## 2022-07-15 NOTE — Progress Notes (Signed)
ANTICOAGULATION CONSULT NOTE  Pharmacy Consult for Heparin infusion Indication: atrial fibrillation  Allergies  Allergen Reactions   Meclizine Diarrhea   Penicillins Rash and Other (See Comments)    Has patient had a PCN reaction causing immediate rash, facial/tongue/throat swelling, SOB or lightheadedness with hypotension: no Has patient had a PCN reaction causing severe rash involving mucus membranes or skin necrosis: No Has patient had a PCN reaction that required hospitalization No Has patient had a PCN reaction occurring within the last 10 years: No If all of the above answers are "NO", then may proceed with Cephalosporin use.     Patient Measurements: Height: 5\' 7"  (170.2 cm) Weight: 85.9 kg (189 lb 6.4 oz) IBW/kg (Calculated) : 61.6 Heparin Dosing Weight: 79.7 kg  Vital Signs: Temp: 98.4 F (36.9 C) (05/22 1208) Temp Source: Oral (05/22 1208) BP: 99/64 (05/22 1208) Pulse Rate: 69 (05/22 1208)  Labs: Recent Labs    07/14/22 1856 07/14/22 1914 07/15/22 0140 07/15/22 1202  HGB 13.6  --  12.4  --   HCT 42.2  --  38.0  --   PLT 230  --  201  --   APTT  --   --  104*  --   LABPROT 15.8*  --   --   --   INR 1.2  --   --   --   HEPARINUNFRC  --   --  0.64 0.80*  CREATININE 0.93  --  0.91  --   TROPONINIHS  --  11  --   --      Estimated Creatinine Clearance: 56.4 mL/min (by C-G formula based on SCr of 0.91 mg/dL).   Medical History: Past Medical History:  Diagnosis Date   (HFpEF) heart failure with preserved ejection fraction (HCC)    A-fib (HCC)    Dx: 01/2022   Breast cancer (HCC) 02/24/2000   Bronchitis    Cancer (HCC)    CHF (congestive heart failure) (HCC)    Chronic back pain    Depression    Dysrhythmia    GERD (gastroesophageal reflux disease)    Hematuria    Hyperlipidemia    Hypertension    Insomnia    Kidney stone    Personal history of chemotherapy    Personal history of radiation therapy    Pleural effusion    Pneumonia    Thyroid  goiter    Vertigo     Medications:  Medications Prior to Admission  Medication Sig Dispense Refill Last Dose   acetaminophen (TYLENOL) 500 MG tablet Take 1,000 mg by mouth every 6 (six) hours as needed for mild pain or moderate pain.    Past Week   acyclovir (ZOVIRAX) 400 MG tablet Take 400 mg by mouth daily.   Past Week   alendronate (FOSAMAX) 70 MG tablet TAKE 1 TABLET BY MOUTH EVERY 7  DAYS WITH A FULL GLASS OF WATER  ON AN EMPTY STOMACH (Patient taking differently: Take 70 mg by mouth once a week.) 12 tablet 3 Past Week   amLODipine-benazepril (LOTREL) 5-40 MG capsule Take 1 capsule by mouth daily.   Past Week   atorvastatin (LIPITOR) 20 MG tablet TAKE 1 TABLET BY MOUTH IN THE  EVENING 90 tablet 3 Past Week   Calcium Carb-Cholecalciferol (CALTRATE 600+D3) 600-20 MG-MCG TABS Take 1 tablet by mouth daily. 60 tablet 11 Past Week   ELIQUIS 5 MG TABS tablet TAKE 1 TABLET BY MOUTH TWICE  DAILY 180 tablet 3 Past Week   FLUoxetine (PROZAC)  10 MG capsule TAKE 1 CAPSULE BY MOUTH DAILY 90 capsule 3 Past Week   furosemide (LASIX) 20 MG tablet Take 3 tablets (60 mg total) by mouth 2 (two) times daily. 180 tablet 1 Past Week   meclizine (ANTIVERT) 25 MG tablet TAKE 1 TABLET BY MOUTH THREE TIMES DAILY AS NEEDED FOR DIZZINESS (Patient taking differently: Take 25 mg by mouth 3 (three) times daily as needed for dizziness.) 30 tablet 0 Past Week   metoprolol tartrate (LOPRESSOR) 25 MG tablet Take 12.5 mg by mouth 2 (two) times daily.   Past Week   ondansetron (ZOFRAN) 4 MG tablet Take 4 mg by mouth as needed for vomiting or nausea.   07/14/2022   potassium chloride SA (KLOR-CON M) 10 MEQ tablet Take 1 tablet (10 mEq total) by mouth every other day. 15 tablet 1 Past Week   temazepam (RESTORIL) 30 MG capsule TAKE 1 CAPSULE BY MOUTH EVERY  NIGHT AT BEDTIME 90 capsule 3 Past Week   vitamin C (ASCORBIC ACID) 500 MG tablet Take 500 mg by mouth daily.   Past Week   Vitamin D, Ergocalciferol, (DRISDOL) 1.25 MG (50000  UNIT) CAPS capsule TAKE 1 CAPSULE BY MOUTH ONCE  WEEKLY 13 capsule 3 Past Week   Elastic Bandages & Supports MISC 1 Package by Does not apply route as directed. 1 Package 0    Misc. Devices (TOILET SAFETY FRAME) MISC 1 each by Does not apply route daily as needed. 1 each 0     Assessment: 82 yo F presents to ED from cardiology clinic for acute HF exacerbation with volume overload. Pt has a history of persistent Afib and takes apixaban 5mg  BID prior to admission. Patient reports last dose of apixaban was taken on 07/07/22, but she also admits she has been confused lately. Pharmacy has been consulted to dose heparin infusion for Afib in anticipation of RHC on 5/22.  -heparin level = 0.8 on 1200 units/hr  Goal of Therapy:  Heparin level 0.3-0.7 units/mL Monitor platelets by anticoagulation protocol: Yes   Plan:  -Decrease heparin to 1050 units/hr -Will follow plans post cath  Harland German, PharmD Clinical Pharmacist **Pharmacist phone directory can now be found on [email or web address] ([email or web address]).  Listed under [facility].

## 2022-07-15 NOTE — Progress Notes (Signed)
Heart Failure Navigator Progress Note  Assessed for Heart & Vascular TOC clinic readiness.  Patient does not meet criteria due to Advanced Heart Failure Team consulted. .   Navigator will sign off at this time.   Geordie Nooney, BSN, RN Heart Failure Nurse Navigator Secure Chat Only   

## 2022-07-15 NOTE — Progress Notes (Signed)
ANTICOAGULATION CONSULT NOTE  Pharmacy Consult for Heparin infusion Indication: atrial fibrillation  Allergies  Allergen Reactions   Meclizine Diarrhea   Penicillins Rash and Other (See Comments)    Has patient had a PCN reaction causing immediate rash, facial/tongue/throat swelling, SOB or lightheadedness with hypotension: no Has patient had a PCN reaction causing severe rash involving mucus membranes or skin necrosis: No Has patient had a PCN reaction that required hospitalization No Has patient had a PCN reaction occurring within the last 10 years: No If all of the above answers are "NO", then may proceed with Cephalosporin use.     Patient Measurements: Height: 5\' 7"  (170.2 cm) Weight: 85.9 kg (189 lb 6.4 oz) IBW/kg (Calculated) : 61.6 Heparin Dosing Weight: 79.7 kg  Vital Signs: Temp: 98.4 F (36.9 C) (05/22 1208) Temp Source: Oral (05/22 1208) BP: 97/66 (05/22 1550) Pulse Rate: 61 (05/22 1550)  Labs: Recent Labs    07/14/22 1856 07/14/22 1914 07/15/22 0140 07/15/22 1202  HGB 13.6  --  12.4  --   HCT 42.2  --  38.0  --   PLT 230  --  201  --   APTT  --   --  104*  --   LABPROT 15.8*  --   --   --   INR 1.2  --   --   --   HEPARINUNFRC  --   --  0.64 0.80*  CREATININE 0.93  --  0.91  --   TROPONINIHS  --  11  --   --      Estimated Creatinine Clearance: 56.4 mL/min (by C-G formula based on SCr of 0.91 mg/dL).   Medical History: Past Medical History:  Diagnosis Date   (HFpEF) heart failure with preserved ejection fraction (HCC)    A-fib (HCC)    Dx: 01/2022   Breast cancer (HCC) 02/24/2000   Bronchitis    Cancer (HCC)    CHF (congestive heart failure) (HCC)    Chronic back pain    Depression    Dysrhythmia    GERD (gastroesophageal reflux disease)    Hematuria    Hyperlipidemia    Hypertension    Insomnia    Kidney stone    Personal history of chemotherapy    Personal history of radiation therapy    Pleural effusion    Pneumonia    Thyroid  goiter    Vertigo     Medications:  Medications Prior to Admission  Medication Sig Dispense Refill Last Dose   acetaminophen (TYLENOL) 500 MG tablet Take 1,000 mg by mouth every 6 (six) hours as needed for mild pain or moderate pain.    Past Week   acyclovir (ZOVIRAX) 400 MG tablet Take 400 mg by mouth daily.   Past Week   alendronate (FOSAMAX) 70 MG tablet TAKE 1 TABLET BY MOUTH EVERY 7  DAYS WITH A FULL GLASS OF WATER  ON AN EMPTY STOMACH (Patient taking differently: Take 70 mg by mouth once a week.) 12 tablet 3 Past Week   amLODipine-benazepril (LOTREL) 5-40 MG capsule Take 1 capsule by mouth daily.   Past Week   atorvastatin (LIPITOR) 20 MG tablet TAKE 1 TABLET BY MOUTH IN THE  EVENING 90 tablet 3 Past Week   Calcium Carb-Cholecalciferol (CALTRATE 600+D3) 600-20 MG-MCG TABS Take 1 tablet by mouth daily. 60 tablet 11 Past Week   ELIQUIS 5 MG TABS tablet TAKE 1 TABLET BY MOUTH TWICE  DAILY 180 tablet 3 Past Week   FLUoxetine (PROZAC)  10 MG capsule TAKE 1 CAPSULE BY MOUTH DAILY 90 capsule 3 Past Week   furosemide (LASIX) 20 MG tablet Take 3 tablets (60 mg total) by mouth 2 (two) times daily. 180 tablet 1 Past Week   meclizine (ANTIVERT) 25 MG tablet TAKE 1 TABLET BY MOUTH THREE TIMES DAILY AS NEEDED FOR DIZZINESS (Patient taking differently: Take 25 mg by mouth 3 (three) times daily as needed for dizziness.) 30 tablet 0 Past Week   metoprolol tartrate (LOPRESSOR) 25 MG tablet Take 12.5 mg by mouth 2 (two) times daily.   Past Week   ondansetron (ZOFRAN) 4 MG tablet Take 4 mg by mouth as needed for vomiting or nausea.   07/14/2022   potassium chloride SA (KLOR-CON M) 10 MEQ tablet Take 1 tablet (10 mEq total) by mouth every other day. 15 tablet 1 Past Week   temazepam (RESTORIL) 30 MG capsule TAKE 1 CAPSULE BY MOUTH EVERY  NIGHT AT BEDTIME 90 capsule 3 Past Week   vitamin C (ASCORBIC ACID) 500 MG tablet Take 500 mg by mouth daily.   Past Week   Vitamin D, Ergocalciferol, (DRISDOL) 1.25 MG (50000  UNIT) CAPS capsule TAKE 1 CAPSULE BY MOUTH ONCE  WEEKLY 13 capsule 3 Past Week   Elastic Bandages & Supports MISC 1 Package by Does not apply route as directed. 1 Package 0    Misc. Devices (TOILET SAFETY FRAME) MISC 1 each by Does not apply route daily as needed. 1 each 0     Assessment: 82 yo F presents to ED from cardiology clinic for acute HF exacerbation with volume overload. Pt has a history of persistent Afib and takes apixaban 5mg  BID prior to admission. Patient reports last dose of apixaban was taken on 07/07/22, but she also admits she has been confused lately. Pharmacy has been consulted to dose heparin infusion for Afib in anticipation of RHC on 5/22.  PM Update: underwent RHC on 5/22. Pharmacy instructed to resume heparin drip 8 hours post sheath removal. Sheath removed at 15:45. Heparin rate reduced this AM for high heparin level.  Goal of Therapy:  Heparin level 0.3-0.7 units/mL Monitor platelets by anticoagulation protocol: Yes   Plan:  Resume heparin at 1050 units/hr tonight at midnight 8 hour heparin level  Rexford Maus, PharmD, BCPS 07/15/2022 4:07 PM

## 2022-07-15 NOTE — Plan of Care (Signed)

## 2022-07-16 ENCOUNTER — Encounter (HOSPITAL_COMMUNITY): Payer: Self-pay | Admitting: Internal Medicine

## 2022-07-16 ENCOUNTER — Inpatient Hospital Stay (HOSPITAL_COMMUNITY): Payer: Medicare Other | Admitting: Anesthesiology

## 2022-07-16 ENCOUNTER — Inpatient Hospital Stay (HOSPITAL_COMMUNITY): Payer: Medicare Other

## 2022-07-16 ENCOUNTER — Encounter (HOSPITAL_COMMUNITY): Admission: EM | Disposition: A | Discharge status: Home / Self Care | Payer: Self-pay | Attending: Cardiology

## 2022-07-16 DIAGNOSIS — I083 Combined rheumatic disorders of mitral, aortic and tricuspid valves: Secondary | ICD-10-CM

## 2022-07-16 DIAGNOSIS — I110 Hypertensive heart disease with heart failure: Secondary | ICD-10-CM

## 2022-07-16 DIAGNOSIS — I5033 Acute on chronic diastolic (congestive) heart failure: Secondary | ICD-10-CM | POA: Diagnosis not present

## 2022-07-16 DIAGNOSIS — I361 Nonrheumatic tricuspid (valve) insufficiency: Secondary | ICD-10-CM

## 2022-07-16 DIAGNOSIS — Z87891 Personal history of nicotine dependence: Secondary | ICD-10-CM

## 2022-07-16 DIAGNOSIS — I509 Heart failure, unspecified: Secondary | ICD-10-CM

## 2022-07-16 HISTORY — PX: TEE WITHOUT CARDIOVERSION: SHX5443

## 2022-07-16 LAB — BASIC METABOLIC PANEL
Anion gap: 11 (ref 5–15)
BUN: 16 mg/dL (ref 8–23)
CO2: 24 mmol/L (ref 22–32)
Calcium: 9.1 mg/dL (ref 8.9–10.3)
Chloride: 104 mmol/L (ref 98–111)
Creatinine, Ser: 1.05 mg/dL — ABNORMAL HIGH (ref 0.44–1.00)
GFR, Estimated: 54 mL/min — ABNORMAL LOW (ref >60)
Glucose, Bld: 95 mg/dL (ref 70–99)
Potassium: 3.4 mmol/L — ABNORMAL LOW (ref 3.5–5.1)
Sodium: 139 mmol/L (ref 135–145)

## 2022-07-16 LAB — CBC
HCT: 38.1 % (ref 36.0–46.0)
Hemoglobin: 12.4 g/dL (ref 12.0–15.0)
MCH: 29.7 pg (ref 26.0–34.0)
MCHC: 32.5 g/dL (ref 30.0–36.0)
MCV: 91.4 fL (ref 80.0–100.0)
Platelets: 224 10*3/uL (ref 150–400)
RBC: 4.17 MIL/uL (ref 3.87–5.11)
RDW: 16.1 % — ABNORMAL HIGH (ref 11.5–15.5)
WBC: 6.8 10*3/uL (ref 4.0–10.5)
nRBC: 0 % (ref 0.0–0.2)

## 2022-07-16 LAB — HEPARIN LEVEL (UNFRACTIONATED): Heparin Unfractionated: 0.35 IU/mL (ref 0.30–0.70)

## 2022-07-16 SURGERY — ECHOCARDIOGRAM, TRANSESOPHAGEAL
Anesthesia: General

## 2022-07-16 MED ORDER — PROPOFOL 10 MG/ML IV BOLUS
INTRAVENOUS | Status: DC | PRN
  Administered 2022-07-16 (×2): 10 mg via INTRAVENOUS

## 2022-07-16 MED ORDER — SODIUM CHLORIDE 0.9 % IV SOLN: INTRAVENOUS | Status: DC

## 2022-07-16 MED ORDER — TORSEMIDE 20 MG PO TABS: 40.0000 mg | ORAL_TABLET | Freq: Two times a day (BID) | ORAL | Status: DC

## 2022-07-16 MED ORDER — PHENYLEPHRINE HCL-NACL 20-0.9 MG/250ML-% IV SOLN
INTRAVENOUS | Status: DC | PRN
  Administered 2022-07-16: 80 ug via INTRAVENOUS
  Administered 2022-07-16: 160 ug via INTRAVENOUS
  Administered 2022-07-16: 80 ug via INTRAVENOUS
  Administered 2022-07-16: 30 ug/min via INTRAVENOUS

## 2022-07-16 MED ORDER — POTASSIUM CHLORIDE CRYS ER 20 MEQ PO TBCR
40.0000 meq | EXTENDED_RELEASE_TABLET | Freq: Once | ORAL | Status: AC
  Administered 2022-07-16: 40 meq via ORAL
  Filled 2022-07-16: qty 2

## 2022-07-16 MED ORDER — LACTATED RINGERS IV BOLUS
250.0000 mL | Freq: Once | INTRAVENOUS | Status: AC
  Administered 2022-07-16: 250 mL via INTRAVENOUS

## 2022-07-16 MED ORDER — SPIRONOLACTONE 12.5 MG HALF TABLET: 12.5000 mg | ORAL_TABLET | Freq: Every day | ORAL | Status: DC

## 2022-07-16 MED ORDER — MIDODRINE HCL 5 MG PO TABS
5.0000 mg | ORAL_TABLET | Freq: Once | ORAL | Status: AC
  Administered 2022-07-16: 5 mg via ORAL
  Filled 2022-07-16: qty 1

## 2022-07-16 MED ORDER — PROPOFOL 500 MG/50ML IV EMUL
INTRAVENOUS | Status: DC | PRN
  Administered 2022-07-16: 100 ug/kg/min via INTRAVENOUS

## 2022-07-16 NOTE — Interval H&P Note (Signed)
History and Physical Interval Note:  07/16/2022 1:26 PM  Theresa Garcia  has presented today for surgery, with the diagnosis of SEVERE TR.  The various methods of treatment have been discussed with the patient and family. After consideration of risks, benefits and other options for treatment, the patient has consented to  Procedure(s): TRANSESOPHAGEAL ECHOCARDIOGRAM (N/A) as a surgical intervention.  The patient's history has been reviewed, patient examined, no change in status, stable for surgery.  I have reviewed the patient's chart and labs.  Questions were answered to the patient's satisfaction.     Archit Leger

## 2022-07-16 NOTE — Anesthesia Postprocedure Evaluation (Signed)
Anesthesia Post Note  Patient: Theresa Garcia  Procedure(s) Performed: TRANSESOPHAGEAL ECHOCARDIOGRAM     Patient location during evaluation: PACU Anesthesia Type: MAC Level of consciousness: awake and alert and oriented Pain management: pain level controlled Vital Signs Assessment: post-procedure vital signs reviewed and stable Respiratory status: spontaneous breathing, nonlabored ventilation and respiratory function stable Cardiovascular status: blood pressure returned to baseline and stable Postop Assessment: no apparent nausea or vomiting Anesthetic complications: no   No notable events documented.  Last Vitals:  Vitals:   07/16/22 1448 07/16/22 1458  BP: 96/60 (!) 85/50  Pulse: 91 63  Resp: 18 16  Temp:    SpO2: 97% 96%    Last Pain:  Vitals:   07/16/22 1458  TempSrc:   PainSc: 0-No pain                 Raigen Jagielski A.

## 2022-07-16 NOTE — Discharge Summary (Signed)
Advanced Heart Failure Team  Discharge Summary   Patient ID: IDMAN Theresa Garcia MRN: [id number], DOB/AGE: 1940/07/19 82 y.o. Admit date: 07/14/2022 D/C date:     07/17/2022   Primary Discharge Diagnoses:    Hospital Course: 82 y.o. female with history of chronic diastolic CHF w/ RV failure, pulmonary hypertension, persistent atrial fibrillation, mitral regurgitation, hypertension, HLD, breast cancer s/p chemoradiation, HSV, hx PE.   Had recent admission at [facility] earlier this month for ADHF. Echo showed preserved LVEF, moderately reduced RV and severe TR due to annular dilation. In persistent Afib w/ CVR. Was diuresed w/ IV Lasix and referred to structural heart clinic at d/c to discuss treatment options for TR.    Was seen in clinic 07/14/22 by Dr. Lynnette Caffey and noted to be marked volume overloaded/ in ADHF and was direct admitted to [facility] for further management and workup. Advanced Heart Failure consulted to assist with management. She was diuresed with IV lasix and transitioned to lasix 60 mg twice a day.   [facility] 05/22 demonstrated normal coronaries, normal left-sided pressures with mildly elevated R sided pressures, mildly reduced CO. No evidence of intracardiac shunting. Hemodynamics suggested TR secondary to annular dilatation.   TEE showed ef 55-60%.RV moderately reduced, TR with severe  malcoaptation. With coaptation gap 1.1-1.2 cm and wide-open TR.     As an outpatient she will be referred to [facility] for possible tricuspid valve intervention.   Discharge Weight: 196 pounds  Discharge Vitals: Blood pressure 97/69, pulse 64, temperature 97.7 F (36.5 C), resp. rate 17, height 5\' 7"  (1.702 m), weight 89.1 kg, SpO2 96 %.  Labs: Lab Results  Component Value Date   WBC 7.8 07/17/2022   HGB 12.3 07/17/2022   HCT 37.9 07/17/2022   MCV 93.1 07/17/2022   PLT 231 07/17/2022    Recent Labs  Lab 07/14/22 1856 07/15/22 0140 07/17/22 0242  NA 137   < > 137  K 4.0   < > 3.9  CL 102   < > 106   CO2 23   < > 22  BUN 12   < > 17  CREATININE 0.93   < > 1.13*  CALCIUM 9.8   < > 8.9  PROT 7.4  --   --   BILITOT 1.5*  --   --   ALKPHOS 72  --   --   ALT 11  --   --   AST 21  --   --   GLUCOSE 121*   < > 94   < > = values in this interval not displayed.   Lab Results  Component Value Date   CHOL 85 07/03/2022   HDL 35 (L) 07/03/2022   LDLCALC 38 07/03/2022   TRIG 61 07/03/2022   BNP (last 3 results) Recent Labs    07/02/22 1148 07/03/22 0421 07/14/22 1856  BNP 405.0* 519.0* 474.5*    ProBNP (last 3 results) No results for input(s): "PROBNP" in the last 8760 hours.   Diagnostic Studies/Procedures   EP STUDY  Result Date: 07/16/2022 See surgical note for result.  CARDIAC CATHETERIZATION  Result Date: 07/15/2022 Findings: Ao = 96/58 (72) LV = 101/4 RA =  11 with prominent v waves (ventricularized) RV = 35/6 PA = 34/9 (18) PCW = 3 Fick cardiac output/index = 4.7/2.4 PVR = 3.2 WU Ao sat = 93% PA sat = 57%, 60% SVC sat 59% Assessment: 1. Normal coronaries 2. LVEF 65% 3. Severe TR 4. Normal left-sided pressures with mildly elevated R-sided  pressures 5. Mildly reduced CO 6. No evidence of intracardiac shunting Plan/Discussion: Hemodynamics suggest TR secondary to annular dilation with no evidence of underlying PAH or restriction. Proceed with TEE. Arvilla Meres, MD 4:03 PM   Discharge Medications   Allergies as of 07/17/2022       Reactions   Meclizine Diarrhea   Penicillins Rash, Other (See Comments)   Has patient had a PCN reaction causing immediate rash, facial/tongue/throat swelling, SOB or lightheadedness with hypotension: no Has patient had a PCN reaction causing severe rash involving mucus membranes or skin necrosis: No Has patient had a PCN reaction that required hospitalization No Has patient had a PCN reaction occurring within the last 10 years: No If all of the above answers are "NO", then may proceed with Cephalosporin use.        Medication List      STOP taking these medications    amLODipine-benazepril 5-40 MG capsule Commonly known as: LOTREL   metoprolol tartrate 25 MG tablet Commonly known as: LOPRESSOR       TAKE these medications    acetaminophen 500 MG tablet Commonly known as: TYLENOL Take 1,000 mg by mouth every 6 (six) hours as needed for mild pain or moderate pain.   acyclovir 400 MG tablet Commonly known as: ZOVIRAX Take 400 mg by mouth daily.   alendronate 70 MG tablet Commonly known as: FOSAMAX TAKE 1 TABLET BY MOUTH EVERY 7  DAYS WITH A FULL GLASS OF WATER  ON AN EMPTY STOMACH What changed: See the new instructions.   ascorbic acid 500 MG tablet Commonly known as: VITAMIN C Take 500 mg by mouth daily.   atorvastatin 20 MG tablet Commonly known as: LIPITOR TAKE 1 TABLET BY MOUTH IN THE  EVENING   Caltrate 600+D3 600-20 MG-MCG Tabs Generic drug: Calcium Carb-Cholecalciferol Take 1 tablet by mouth daily.   Elastic Bandages & Supports Misc 1 Package by Does not apply route as directed.   Eliquis 5 MG Tabs tablet Generic drug: apixaban TAKE 1 TABLET BY MOUTH TWICE  DAILY   FLUoxetine 10 MG capsule Commonly known as: PROZAC TAKE 1 CAPSULE BY MOUTH DAILY   furosemide 20 MG tablet Commonly known as: LASIX Take 3 tablets (60 mg total) by mouth 2 (two) times daily.   meclizine 25 MG tablet Commonly known as: ANTIVERT TAKE 1 TABLET BY MOUTH THREE TIMES DAILY AS NEEDED FOR DIZZINESS What changed: See the new instructions.   ondansetron 4 MG tablet Commonly known as: ZOFRAN Take 4 mg by mouth as needed for vomiting or nausea.   potassium chloride 10 MEQ tablet Commonly known as: KLOR-CON M Take 1 tablet (10 mEq total) by mouth every other day.   temazepam 30 MG capsule Commonly known as: RESTORIL TAKE 1 CAPSULE BY MOUTH EVERY  NIGHT AT BEDTIME   Toilet Safety Frame Misc 1 each by Does not apply route daily as needed.   Vitamin D (Ergocalciferol) 1.25 MG (50000 UNIT) Caps  capsule Commonly known as: DRISDOL TAKE 1 CAPSULE BY MOUTH ONCE  WEEKLY        Disposition   The patient will be discharged in stable condition to home. Discharge Instructions     (HEART FAILURE PATIENTS) Call MD:  Anytime you have any of the following symptoms: 1) 3 pound weight gain in 24 hours or 5 pounds in 1 week 2) shortness of breath, with or without a dry hacking cough 3) swelling in the hands, feet or stomach 4) if you have to  sleep on extra pillows at night in order to breathe.   Complete by: As directed    Diet - low sodium heart healthy   Complete by: As directed    Heart Failure patients record your daily weight using the same scale at the same time of day   Complete by: As directed    Increase activity slowly   Complete by: As directed        Follow-up Information     [facility] Follow up.   Specialty: Cardiology Contact information: [address] [id number] [address] [address] [place] [address] [phone]                  Duration of Discharge Encounter: Greater than 35 minutes   Signed, Tonye Becket NP-C  07/17/2022, 10:28 AM

## 2022-07-16 NOTE — Progress Notes (Addendum)
Advanced Heart Failure Rounding Note  PCP-Cardiologist: Vishnu P Mallipeddi, MD   Subjective:    Continues to diurese with IV lasix 80 BID. Is/Os not complete. Down 7 lb from admit.  Scr 0.91>1.05  Soft BP with amlodipine and benazepril.  Going for TEE today.  Shortness of breath improving but still gets winded if attempts physical activity.   R/LHC 05/22: Findings:   Ao = 96/58 (72)  LV = 101/4 RA =  11 with prominent v waves (ventricularized) RV = 35/6 PA = 34/9 (18) PCW = 3 Fick cardiac output/index = 4.7/2.4 PVR = 3.2 WU Ao sat = 93% PA sat = 57%, 60% SVC sat 59%   Assessment: 1. Normal coronaries 2. LVEF 65% 3. Severe TR  4. Normal left-sided pressures with mildly elevated R-sided pressures  5. Mildly reduced CO 6. No evidence of intracardiac shunting   Plan/Discussion:    Hemodynamics suggest TR secondary to annular dilation with no evidence of underlying PAH or restriction. Proceed with TEE.    Objective:   Weight Range: 83 kg Body mass index is 28.66 kg/m.   Vital Signs:   Temp:  [97.5 F (36.4 C)-98.4 F (36.9 C)] 97.5 F (36.4 C) (05/23 0500) Pulse Rate:  [53-84] 60 (05/23 0500) Resp:  [14-25] 16 (05/23 0946) BP: (90-125)/(55-81) 125/79 (05/23 0946) SpO2:  [91 %-98 %] 98 % (05/23 0500) Weight:  [83 kg] 83 kg (05/23 0500)    Weight change: Filed Weights   07/14/22 2000 07/16/22 0500  Weight: 85.9 kg 83 kg    Intake/Output:   Intake/Output Summary (Last 24 hours) at 07/16/2022 1138 Last data filed at 07/16/2022 1127 Gross per 24 hour  Intake 417.35 ml  Output 1000 ml  Net -582.65 ml      Physical Exam    General:  Well appearing elderly female. Lying in bed. HEENT: Normal Neck: Supple. JVP 10 with prominent v waves. Carotids 2+ bilat; no bruits.  Cor: PMI nondisplaced. Irregular rhythm. No rubs, gallops, 2/6 TR murmur. Lungs: Clear Abdomen: Soft, nontender, nondistended.  Extremities: No cyanosis, clubbing, rash,  edema Neuro: Alert & orientedx3. Affect pleasant   Telemetry   Afib 60s   Labs    CBC Recent Labs    07/14/22 1856 07/15/22 0140 07/15/22 1527 07/15/22 1541 07/16/22 0817  WBC 8.3 8.0  --   --  6.8  NEUTROABS 5.2  --   --   --   --   HGB 13.6 12.4   < > 12.9 12.4  HCT 42.2 38.0   < > 38.0 38.1  MCV 93.8 92.2  --   --  91.4  PLT 230 201  --   --  224   < > = values in this interval not displayed.   Basic Metabolic Panel Recent Labs    32/44/01 1856 07/15/22 0140 07/15/22 1527 07/15/22 1541 07/16/22 0817  NA 137 135   < > 142 139  K 4.0 3.5   < > 3.3* 3.4*  CL 102 102  --   --  104  CO2 23 22  --   --  24  GLUCOSE 121* 98  --   --  95  BUN 12 13  --   --  16  CREATININE 0.93 0.91  --   --  1.05*  CALCIUM 9.8 9.1  --   --  9.1  MG 2.0  --   --   --   --    < > =  values in this interval not displayed.   Liver Function Tests Recent Labs    07/14/22 1856  AST 21  ALT 11  ALKPHOS 72  BILITOT 1.5*  PROT 7.4  ALBUMIN 3.5   No results for input(s): "LIPASE", "AMYLASE" in the last 72 hours. Cardiac Enzymes No results for input(s): "CKTOTAL", "CKMB", "CKMBINDEX", "TROPONINI" in the last 72 hours.  BNP: BNP (last 3 results) Recent Labs    07/02/22 1148 07/03/22 0421 07/14/22 1856  BNP 405.0* 519.0* 474.5*    ProBNP (last 3 results) No results for input(s): "PROBNP" in the last 8760 hours.   D-Dimer No results for input(s): "DDIMER" in the last 72 hours. Hemoglobin A1C No results for input(s): "HGBA1C" in the last 72 hours. Fasting Lipid Panel No results for input(s): "CHOL", "HDL", "LDLCALC", "TRIG", "CHOLHDL", "LDLDIRECT" in the last 72 hours. Thyroid Function Tests Recent Labs    07/14/22 1856  TSH 1.491    Other results:   Imaging    CARDIAC CATHETERIZATION  Result Date: 07/15/2022 Findings: Ao = 96/58 (72) LV = 101/4 RA =  11 with prominent v waves (ventricularized) RV = 35/6 PA = 34/9 (18) PCW = 3 Fick cardiac output/index =  4.7/2.4 PVR = 3.2 WU Ao sat = 93% PA sat = 57%, 60% SVC sat 59% Assessment: 1. Normal coronaries 2. LVEF 65% 3. Severe TR 4. Normal left-sided pressures with mildly elevated R-sided pressures 5. Mildly reduced CO 6. No evidence of intracardiac shunting Plan/Discussion: Hemodynamics suggest TR secondary to annular dilation with no evidence of underlying PAH or restriction. Proceed with TEE. Arvilla Meres, MD 4:03 PM    Medications:     Scheduled Medications:  amLODipine  5 mg Oral Daily   atorvastatin  20 mg Oral Daily   benazepril  40 mg Oral Daily   diphenhydrAMINE  25 mg Oral Once   FLUoxetine  10 mg Oral Daily   furosemide  80 mg Intravenous BID   hydrocortisone cream   Topical BID   potassium chloride  40 mEq Oral Q4H   sodium chloride flush  3 mL Intravenous Q12H   sodium chloride flush  3 mL Intravenous Q12H    Infusions:  sodium chloride     sodium chloride     sodium chloride     heparin 1,050 Units/hr (07/16/22 0015)    PRN Medications: sodium chloride, sodium chloride, acetaminophen, ondansetron (ZOFRAN) IV, sodium chloride flush, sodium chloride flush, temazepam    Patient Profile   82 y/o AAF w/ h/o HFpEF, chronic Rt sided heart failure w/ severe functional TR, mod MR, pulmonary HTN, persistent atrial fibrillation, HTN and h/o breast cancer in 2002 treated w/ chemoradiation, admitted w/ ADHF. AHF team asked to assist w/ RHC for further w/u of RV failure and TR.   Assessment/Plan    1. Acute on Chronic HFpEF w/ Prominent RV Dysfunction  - Echo 5/24 60-65%, mod LVH, RV mod enlarged, systolic fx mod reduced, D-shaped LV c/w RV volume overload, severe RAE, severe TR, RVSP only 27 mmHg  - R/LHC 07/15/22: Normal cors, normal left-sided pressures with mildly elevated right-sided pressures, mildly reduced CO, no intracardiac shunting. Hemodynamics suggest TR secondary to annualar dilatation.  - admitted w/ volume overload and NYHA Class III symptoms  - Volume  improved. Stop IV lasix. Start po Torsemide 40 BID (takes 60 lasix BID) in 1-2 days - Add spiro 12.5 mg daily as tolerated    2. Severe TR  - Severe RAE + Mod RV enlargement  -  recent echo showed incomplete coaptation secondardy to annular dilatation  - RHC hemodynamics suggest TR d/t annular dilatation. No evidence of restriction or PAH. - TEE today - Structural Heart Team following  - suspect will need Tri clip    3. Persistent Atrial Fibrillation  - rate controlled, HR 60s - on Eliquis PTA - covering w/ heparin gtt until completion of invasive procedures    5. Hypertension  - She's had soft BP in setting of RV dysfunction - Stopped amlodipine and benazepril - Add spiro 12.5 mg daily   6. Mitral Regurgitation  - MV degenerative on recent echo w/ mild-mod MR  - RHC as above - TEE today  7. Hypokalemia - K 3.4 - Supp K - Added spiro   Length of Stay: 2  FINCH, LINDSAY N, PA-C  07/16/2022, 11:38 AM  Advanced Heart Failure Team Pager [phone] (M-F; 7a - 5p)  Please contact [facility] for night-coverage after hours (5p -7a ) and weekends on [email or web address]    Patient seen and examined with the above-signed Advanced Practice Provider and/or Housestaff. I personally reviewed laboratory data, imaging studies and relevant notes. I independently examined the patient and formulated the important aspects of the plan. I have edited the note to reflect any of my changes or salient points. I have personally discussed the plan with the patient and/or family.  Diuresed well with IV lasix. Breathing better but still SOB with mild activity.   Cath results reviewed  General:  Lying in bed  No resp difficulty HEENT: normal Neck: supple. no JVD. Carotids 2+ bilat; no bruits. No lymphadenopathy or thryomegaly appreciated. Cor: PMI nondisplaced. Irregular rate & rhythm. 2/6 TR Lungs: clear Abdomen: soft, nontender, nondistended. No hepatosplenomegaly. No bruits or masses. Good bowel  sounds. Extremities: no cyanosis, clubbing, rash, edema Neuro: alert & orientedx3, cranial nerves grossly intact. moves all 4 extremities w/o difficulty. Affect pleasant  Hold diuretics for now. Plan TEE today to further assess TR. Continue heparin for now. Switch back to Eliquis post-procedure.   Arvilla Meres, MD  1:28 PM

## 2022-07-16 NOTE — Progress Notes (Signed)
This RN paged Duke PA at [phone] to notify about patient having a lower BP post TEE procedure. Duke, PA reached out to this RN at [phone] and was told to continue to monitor BP for now and reach out if BP is still low. Patient remains asymptomatic.

## 2022-07-16 NOTE — Progress Notes (Signed)
Dr Gala Romney informed pt having bloody secretions post TEE, informed to hold heparin gtt x30 minutes and then resume, safety maintained

## 2022-07-16 NOTE — Anesthesia Preprocedure Evaluation (Addendum)
Anesthesia Evaluation  Patient identified by MRN, date of birth, ID band Patient awake    Reviewed: Allergy & Precautions, H&P , NPO status , Patient's Chart, lab work & pertinent test results, reviewed documented beta blocker date and time   History of Anesthesia Complications Negative for: history of anesthetic complications  Airway Mallampati: I  TM Distance: >3 FB Neck ROM: Full    Dental  (+) Edentulous Upper, Caps, Dental Advisory Given, Missing,    Pulmonary shortness of breath, with exertion and at rest, pneumonia, former smoker   breath sounds clear to auscultation + decreased breath sounds      Cardiovascular hypertension, Pt. on medications pulmonary hypertension+CHF  + dysrhythmias Atrial Fibrillation + Valvular Problems/Murmurs  Rhythm:Irregular Rate:Normal  Severe TR   Neuro/Psych  Headaches PSYCHIATRIC DISORDERS Anxiety Depression       GI/Hepatic Neg liver ROS,GERD  Medicated,,  Endo/Other  Hyperlipidemia  Renal/GU Renal disease  negative genitourinary   Musculoskeletal  (+) Arthritis , Osteoarthritis,    Abdominal   Peds  Hematology On Heparin gtt   Anesthesia Other Findings   Reproductive/Obstetrics                             Anesthesia Physical Anesthesia Plan  ASA: 3  Anesthesia Plan: General   Post-op Pain Management: Minimal or no pain anticipated   Induction: Intravenous  PONV Risk Score and Plan: 3 and Treatment may vary due to age or medical condition and Propofol infusion  Airway Management Planned: Natural Airway and Nasal Cannula  Additional Equipment: None  Intra-op Plan:   Post-operative Plan: Extubation in OR  Informed Consent: I have reviewed the patients History and Physical, chart, labs and discussed the procedure including the risks, benefits and alternatives for the proposed anesthesia with the patient or authorized representative who has  indicated his/her understanding and acceptance.   Patient has DNR.  Discussed DNR with patient and Suspend DNR.   Dental advisory given  Plan Discussed with: Anesthesiologist and CRNA  Anesthesia Plan Comments:         Anesthesia Quick Evaluation

## 2022-07-16 NOTE — Transfer of Care (Signed)
Immediate Anesthesia Transfer of Care Note  Patient: Theresa Garcia  Procedure(s) Performed: TRANSESOPHAGEAL ECHOCARDIOGRAM  Patient Location: Cath Lab  Anesthesia Type:General  Level of Consciousness: awake, alert , and oriented  Airway & Oxygen Therapy: Patient Spontanous Breathing and Patient connected to nasal cannula oxygen  Post-op Assessment: Report given to RN, Post -op Vital signs reviewed and stable, Patient moving all extremities X 4, and Patient able to stick tongue midline  Post vital signs: Reviewed  Last Vitals:  Vitals Value Taken Time  BP 102/78   Temp 98.6   Pulse 66   Resp 15   SpO2 96     Last Pain:  Vitals:   07/16/22 1245  TempSrc:   PainSc: 0-No pain      Patients Stated Pain Goal: 0 (07/15/22 2030)  Complications: No notable events documented.

## 2022-07-16 NOTE — CV Procedure (Signed)
    TRANSESOPHAGEAL ECHOCARDIOGRAM   NAME:  Theresa Garcia   MRN: [id number] DOB:  09/26/1940   ADMIT DATE: 07/14/2022  INDICATIONS:  Tricuspid regurgitation  PROCEDURE:   Informed consent was obtained prior to the procedure. The risks, benefits and alternatives for the procedure were discussed and the patient comprehended these risks.  Risks include, but are not limited to, cough, sore throat, vomiting, nausea, somnolence, esophageal and stomach trauma or perforation, bleeding, low blood pressure, aspiration, pneumonia, infection, trauma to the teeth and death.    After a procedural time-out, the patient was was sedated by the anesthesia service. The transesophageal probe was inserted in the esophagus and stomach without difficulty and multiple views were obtained.    COMPLICATIONS:    There were no immediate complications.  FINDINGS:  LEFT VENTRICLE: EF = 55-60%. No regional wall motion abnormalities.  RIGHT VENTRICLE: Moderately dilated. Mild to moderate global hypokinesis. Prominent apical trabeculations.   LEFT ATRIUM: Moderately dilated  LEFT ATRIAL APPENDAGE: No thrombus.   RIGHT ATRIUM: Massively dilated  AORTIC VALVE:  Trileaflet.  Mild AI  MITRAL VALVE:   Mildly thickened. Mild restriction of PMVL. Mild MR  TRICUSPID VALVE:  Markedly dilated annulus. Structurally normal leaflets. Severe malcoaptation. With coaptation gap 1.1-1.2 cm and wide-open TR  PULMONIC VALVE: Grossly normal.  INTERATRIAL SEPTUM: No PFO or ASD.  PERICARDIUM: No effusion  DESCENDING AORTA: minimal plaque    Korrey Schleicher,MD 3:15 PM

## 2022-07-16 NOTE — Discharge Instructions (Signed)

## 2022-07-16 NOTE — Progress Notes (Signed)
Purewick placed at pt request to void, peri care given, tolerated well, safety maintained

## 2022-07-16 NOTE — Plan of Care (Signed)

## 2022-07-16 NOTE — Progress Notes (Signed)
ANTICOAGULATION CONSULT NOTE  Pharmacy Consult for Heparin infusion Indication: atrial fibrillation  Allergies  Allergen Reactions   Meclizine Diarrhea   Penicillins Rash and Other (See Comments)    Has patient had a PCN reaction causing immediate rash, facial/tongue/throat swelling, SOB or lightheadedness with hypotension: no Has patient had a PCN reaction causing severe rash involving mucus membranes or skin necrosis: No Has patient had a PCN reaction that required hospitalization No Has patient had a PCN reaction occurring within the last 10 years: No If all of the above answers are "NO", then may proceed with Cephalosporin use.     Patient Measurements: Height: 5\' 7"  (170.2 cm) Weight: 83 kg (182 lb 15.7 oz) IBW/kg (Calculated) : 61.6 Heparin Dosing Weight: 79.7 kg  Vital Signs: Temp: 97.5 F (36.4 C) (05/23 0500) Temp Source: Oral (05/23 0500) BP: 125/79 (05/23 0946) Pulse Rate: 60 (05/23 0500)  Labs: Recent Labs    07/14/22 1856 07/14/22 1914 07/15/22 0140 07/15/22 1202 07/15/22 1527 07/15/22 1533 07/15/22 1541 07/16/22 0817  HGB 13.6  --  12.4  --    < > 13.3 12.9 12.4  HCT 42.2  --  38.0  --    < > 39.0 38.0 38.1  PLT 230  --  201  --   --   --   --  224  APTT  --   --  104*  --   --   --   --   --   LABPROT 15.8*  --   --   --   --   --   --   --   INR 1.2  --   --   --   --   --   --   --   HEPARINUNFRC  --   --  0.64 0.80*  --   --   --  0.35  CREATININE 0.93  --  0.91  --   --   --   --  1.05*  TROPONINIHS  --  11  --   --   --   --   --   --    < > = values in this interval not displayed.     Estimated Creatinine Clearance: 48.1 mL/min (A) (by C-G formula based on SCr of 1.05 mg/dL (H)).   Medical History: Past Medical History:  Diagnosis Date   (HFpEF) heart failure with preserved ejection fraction (HCC)    A-fib (HCC)    Dx: 01/2022   Breast cancer (HCC) 02/24/2000   Bronchitis    Cancer (HCC)    CHF (congestive heart failure) (HCC)     Chronic back pain    Depression    Dysrhythmia    GERD (gastroesophageal reflux disease)    Hematuria    Hyperlipidemia    Hypertension    Insomnia    Kidney stone    Personal history of chemotherapy    Personal history of radiation therapy    Pleural effusion    Pneumonia    Thyroid goiter    Vertigo     Medications:  Medications Prior to Admission  Medication Sig Dispense Refill Last Dose   acetaminophen (TYLENOL) 500 MG tablet Take 1,000 mg by mouth every 6 (six) hours as needed for mild pain or moderate pain.    Past Week   acyclovir (ZOVIRAX) 400 MG tablet Take 400 mg by mouth daily.   Past Week   alendronate (FOSAMAX) 70 MG tablet TAKE 1 TABLET BY MOUTH  EVERY 7  DAYS WITH A FULL GLASS OF WATER  ON AN EMPTY STOMACH (Patient taking differently: Take 70 mg by mouth once a week.) 12 tablet 3 Past Week   amLODipine-benazepril (LOTREL) 5-40 MG capsule Take 1 capsule by mouth daily.   Past Week   atorvastatin (LIPITOR) 20 MG tablet TAKE 1 TABLET BY MOUTH IN THE  EVENING 90 tablet 3 Past Week   Calcium Carb-Cholecalciferol (CALTRATE 600+D3) 600-20 MG-MCG TABS Take 1 tablet by mouth daily. 60 tablet 11 Past Week   ELIQUIS 5 MG TABS tablet TAKE 1 TABLET BY MOUTH TWICE  DAILY 180 tablet 3 Past Week   FLUoxetine (PROZAC) 10 MG capsule TAKE 1 CAPSULE BY MOUTH DAILY 90 capsule 3 Past Week   furosemide (LASIX) 20 MG tablet Take 3 tablets (60 mg total) by mouth 2 (two) times daily. 180 tablet 1 Past Week   meclizine (ANTIVERT) 25 MG tablet TAKE 1 TABLET BY MOUTH THREE TIMES DAILY AS NEEDED FOR DIZZINESS (Patient taking differently: Take 25 mg by mouth 3 (three) times daily as needed for dizziness.) 30 tablet 0 Past Week   metoprolol tartrate (LOPRESSOR) 25 MG tablet Take 12.5 mg by mouth 2 (two) times daily.   Past Week   ondansetron (ZOFRAN) 4 MG tablet Take 4 mg by mouth as needed for vomiting or nausea.   07/14/2022   potassium chloride SA (KLOR-CON M) 10 MEQ tablet Take 1 tablet (10 mEq  total) by mouth every other day. 15 tablet 1 Past Week   temazepam (RESTORIL) 30 MG capsule TAKE 1 CAPSULE BY MOUTH EVERY  NIGHT AT BEDTIME 90 capsule 3 Past Week   vitamin C (ASCORBIC ACID) 500 MG tablet Take 500 mg by mouth daily.   Past Week   Vitamin D, Ergocalciferol, (DRISDOL) 1.25 MG (50000 UNIT) CAPS capsule TAKE 1 CAPSULE BY MOUTH ONCE  WEEKLY 13 capsule 3 Past Week   Elastic Bandages & Supports MISC 1 Package by Does not apply route as directed. 1 Package 0    Misc. Devices (TOILET SAFETY FRAME) MISC 1 each by Does not apply route daily as needed. 1 each 0     Assessment: 82 yo F presents to ED from cardiology clinic for acute HF exacerbation with volume overload. Pt has a history of persistent Afib and takes apixaban 5mg  BID prior to admission. Patient reports last dose of apixaban was taken on 07/07/22, but she also admits she has been confused lately. Pharmacy has been consulted to dose heparin infusion for Afib in anticipation of RHC on 5/22.  PM Update: underwent RHC on 5/22. Pharmacy instructed to resume heparin drip 8 hours post sheath removal. Sheath removed at 15:45. Heparin rate reduced this AM for high heparin level.  Heparin drip 1050 uts/hr with heparin level 0.35 at goal  Cbc stable no bleeding  Await complete procedures prior to restarting oral anticoagulation   Goal of Therapy:  Heparin level 0.3-0.7 units/mL Monitor platelets by anticoagulation protocol: Yes   Plan:  Continue heparin drip 1050 uts/hr Daily heparin lvel and CBC Monitor s/s bleeding    Leota Sauers Pharm.D. CPP, BCPS Clinical Pharmacist [phone] 07/16/2022 10:28 AM

## 2022-07-17 ENCOUNTER — Encounter (HOSPITAL_COMMUNITY): Payer: Self-pay | Admitting: Internal Medicine

## 2022-07-17 ENCOUNTER — Telehealth: Payer: Self-pay | Admitting: Family Medicine

## 2022-07-17 ENCOUNTER — Inpatient Hospital Stay (HOSPITAL_COMMUNITY): Payer: Medicare Other

## 2022-07-17 DIAGNOSIS — I361 Nonrheumatic tricuspid (valve) insufficiency: Secondary | ICD-10-CM | POA: Diagnosis not present

## 2022-07-17 DIAGNOSIS — I5033 Acute on chronic diastolic (congestive) heart failure: Secondary | ICD-10-CM | POA: Diagnosis not present

## 2022-07-17 LAB — BASIC METABOLIC PANEL
Anion gap: 9 (ref 5–15)
BUN: 17 mg/dL (ref 8–23)
CO2: 22 mmol/L (ref 22–32)
Calcium: 8.9 mg/dL (ref 8.9–10.3)
Chloride: 106 mmol/L (ref 98–111)
Creatinine, Ser: 1.13 mg/dL — ABNORMAL HIGH (ref 0.44–1.00)
GFR, Estimated: 49 mL/min — ABNORMAL LOW (ref >60)
Glucose, Bld: 94 mg/dL (ref 70–99)
Potassium: 3.9 mmol/L (ref 3.5–5.1)
Sodium: 137 mmol/L (ref 135–145)

## 2022-07-17 LAB — CBC
HCT: 37.9 % (ref 36.0–46.0)
Hemoglobin: 12.3 g/dL (ref 12.0–15.0)
MCH: 30.2 pg (ref 26.0–34.0)
MCHC: 32.5 g/dL (ref 30.0–36.0)
MCV: 93.1 fL (ref 80.0–100.0)
Platelets: 231 10*3/uL (ref 150–400)
RBC: 4.07 MIL/uL (ref 3.87–5.11)
RDW: 16.2 % — ABNORMAL HIGH (ref 11.5–15.5)
WBC: 7.8 10*3/uL (ref 4.0–10.5)
nRBC: 0 % (ref 0.0–0.2)

## 2022-07-17 LAB — MAGNESIUM: Magnesium: 1.9 mg/dL (ref 1.7–2.4)

## 2022-07-17 LAB — ECHO TEE

## 2022-07-17 MED ORDER — GADOBUTROL 1 MMOL/ML IV SOLN
9.0000 mL | Freq: Once | INTRAVENOUS | Status: AC | PRN
  Administered 2022-07-17: 9 mL via INTRAVENOUS

## 2022-07-17 MED ORDER — APIXABAN 5 MG PO TABS
5.0000 mg | ORAL_TABLET | Freq: Two times a day (BID) | ORAL | Status: DC
  Administered 2022-07-17: 5 mg via ORAL
  Filled 2022-07-17: qty 1

## 2022-07-17 NOTE — Progress Notes (Addendum)
Advanced Heart Failure Rounding Note  PCP-Cardiologist: Vishnu P Mallipeddi, MD   Subjective:   TEE- Severe malcoaptation. With coaptation gap 1.1-1.2 cm and wide-open TR    R/LHC 05/22: Findings:  Ao = 96/58 (72)  LV = 101/4 RA =  11 with prominent v waves (ventricularized) RV = 35/6 PA = 34/9 (18) PCW = 3 Fick cardiac output/index = 4.7/2.4 PVR = 3.2 WU Ao sat = 93% PA sat = 57%, 60% SVC sat 59%  Assessment: 1. Normal coronaries 2. LVEF 65% 3. Severe TR  4. Normal left-sided pressures with mildly elevated R-sided pressures  5. Mildly reduced CO 6. No evidence of intracardiac shunting  Plan/Discussion:   Hemodynamics suggest TR secondary to annular dilation with no evidence of underlying PAH or restriction. Proceed with TEE.   Denies SOB.    Objective:   Weight Range: 89.1 kg Body mass index is 30.77 kg/m.   Vital Signs:   Temp:  [97.7 F (36.5 C)] 97.7 F (36.5 C) (05/24 0402) Pulse Rate:  [60-91] 63 (05/24 0403) Resp:  [15-22] 17 (05/24 0403) BP: (74-125)/(50-79) 89/53 (05/24 0402) SpO2:  [95 %-99 %] 98 % (05/24 0403) Weight:  [89.1 kg] 89.1 kg (05/24 0350) Last BM Date : 07/16/22  Weight change: Filed Weights   07/14/22 2000 07/16/22 0500 07/17/22 0350  Weight: 85.9 kg 83 kg 89.1 kg    Intake/Output:   Intake/Output Summary (Last 24 hours) at 07/17/2022 0913 Last data filed at 07/16/2022 2359 Gross per 24 hour  Intake 760.94 ml  Output 1350 ml  Net -589.06 ml      Physical Exam   General:  . No resp difficulty HEENT: normal Neck: supple. JVP 6-7. Carotids 2+ bilat; no bruits. No lymphadenopathy or thryomegaly appreciated. Cor: PMI nondisplaced. Irregular rate & rhythm. No rubs, gallops . 2/6 TR. Lungs: clear Abdomen: soft, nontender, nondistended. No hepatosplenomegaly. No bruits or masses. Good bowel sounds. Extremities: no cyanosis, clubbing, rash, edema Neuro: alert & orientedx3, cranial nerves grossly intact. moves all 4  extremities w/o difficulty. Affect pleasant   Telemetry     A Fib 60-90s    Labs    CBC Recent Labs    07/14/22 1856 07/15/22 0140 07/16/22 0817 07/17/22 0242  WBC 8.3   < > 6.8 7.8  NEUTROABS 5.2  --   --   --   HGB 13.6   < > 12.4 12.3  HCT 42.2   < > 38.1 37.9  MCV 93.8   < > 91.4 93.1  PLT 230   < > 224 231   < > = values in this interval not displayed.   Basic Metabolic Panel Recent Labs    45/40/98 1856 07/15/22 0140 07/16/22 0817 07/17/22 0242  NA 137   < > 139 137  K 4.0   < > 3.4* 3.9  CL 102   < > 104 106  CO2 23   < > 24 22  GLUCOSE 121*   < > 95 94  BUN 12   < > 16 17  CREATININE 0.93   < > 1.05* 1.13*  CALCIUM 9.8   < > 9.1 8.9  MG 2.0  --   --  1.9   < > = values in this interval not displayed.   Liver Function Tests Recent Labs    07/14/22 1856  AST 21  ALT 11  ALKPHOS 72  BILITOT 1.5*  PROT 7.4  ALBUMIN 3.5   No results for  input(s): "LIPASE", "AMYLASE" in the last 72 hours. Cardiac Enzymes No results for input(s): "CKTOTAL", "CKMB", "CKMBINDEX", "TROPONINI" in the last 72 hours.  BNP: BNP (last 3 results) Recent Labs    07/02/22 1148 07/03/22 0421 07/14/22 1856  BNP 405.0* 519.0* 474.5*    ProBNP (last 3 results) No results for input(s): "PROBNP" in the last 8760 hours.   D-Dimer No results for input(s): "DDIMER" in the last 72 hours. Hemoglobin A1C No results for input(s): "HGBA1C" in the last 72 hours. Fasting Lipid Panel No results for input(s): "CHOL", "HDL", "LDLCALC", "TRIG", "CHOLHDL", "LDLDIRECT" in the last 72 hours. Thyroid Function Tests Recent Labs    07/14/22 1856  TSH 1.491    Other results:   Imaging    EP STUDY  Result Date: 07/16/2022 See surgical note for result.    Medications:     Scheduled Medications:  diphenhydrAMINE  25 mg Oral Once   FLUoxetine  10 mg Oral Daily   hydrocortisone cream   Topical BID   sodium chloride flush  3 mL Intravenous Q12H   sodium chloride flush  3  mL Intravenous Q12H    Infusions:  sodium chloride     sodium chloride     heparin 1,050 Units/hr (07/17/22 0358)    PRN Medications: sodium chloride, sodium chloride, acetaminophen, ondansetron (ZOFRAN) IV, sodium chloride flush, sodium chloride flush, temazepam    Patient Profile   82 y/o AAF w/ h/o HFpEF, chronic Rt sided heart failure w/ severe functional TR, mod MR, pulmonary HTN, persistent atrial fibrillation, HTN and h/o breast cancer in 2002 treated w/ chemoradiation, admitted w/ ADHF. AHF team asked to assist w/ RHC for further w/u of RV failure and TR.   Assessment/Plan    1. Acute on Chronic HFpEF w/ Prominent RV Dysfunction  - Echo 5/24 60-65%, mod LVH, RV mod enlarged, systolic fx mod reduced, D-shaped LV c/w RV volume overload, severe RAE, severe TR, RVSP only 27 mmHg  - R/LHC 07/15/22: Normal cors, normal left-sided pressures with mildly elevated right-sided pressures, mildly reduced CO, no intracardiac shunting. Hemodynamics suggest TR secondary to annualar dilatation.  - admitted w/ volume overload and NYHA Class III symptoms  - Volume status stable. Restart lasix 60 mg twice a day.  - Continue spiro 12.5 mg daily as tolerated   2. Severe TR  - Severe RAE + Mod RV enlargement  - recent echo showed incomplete coaptation secondardy to annular dilatation  - RHC hemodynamics suggest TR d/t annular dilatation. No evidence of restriction or PAH. - TEE - Severe malcoaptation. With coaptation gap 1.1-1.2 cm and wide-open TR  - Structural Heart Team following and will need follow up.  - Will need referral to [facility]    3. Persistent Atrial Fibrillation  -- on Eliquis PTA -Stop heparin gtt. Start eliquis 5 mg twice a day.   5. Hypertension  - She's had soft BP in setting of RV dysfunction - Stopped amlodipine and benazepril - Continue spiro 12.5 mg daily   6. Mitral Regurgitation  - MV degenerative on recent echo w/ mild-mod MR  - RHC as above - TEE - mild  restriction.   7. Hypokalemia - K 3.9.  - Added spiro   Home today.   Length of Stay: 3  Amy Clegg, NP  07/17/2022, 9:13 AM  Advanced Heart Failure Team Pager [phone] (M-F; 7a - 5p)  Please contact [facility] for night-coverage after hours (5p -7a ) and weekends on [email or web address]  Patient seen and examined  with the above-signed Advanced Practice Provider and/or Housestaff. I personally reviewed laboratory data, imaging studies and relevant notes. I independently examined the patient and formulated the important aspects of the plan. I have edited the note to reflect any of my changes or salient points. I have personally discussed the plan with the patient and/or family.  Feels ok. No CP or SOB  Results of cMRI pending.   Results of TEE reviewed with Dr. Lynnette Caffey. TV gap too large for TV clip. Potential candidate for Evoque Valve.   Images have been sent to [facility] for review. Await cMRI results as well.   Ok for d/c today.   Arvilla Meres, MD  1:43 PM

## 2022-07-17 NOTE — Telephone Encounter (Signed)
Brother Kandice Robinsons dropped off FMLA forms  Contact [employer] at [phone] when completed. Patient is currently in the hospital  Copied Noted sleeved

## 2022-07-17 NOTE — Progress Notes (Signed)
5/23 HS dose of restoril accidentally dropped on floor. Wasted w/ Leanor Kail, RN & additional dose pulled from pyxis

## 2022-07-17 NOTE — Care Management Important Message (Signed)
Important Message  Patient Details  Name: Theresa Garcia MRN: [id number] Date of Birth: [birth year]   Medicare Important Message Given:  Yes     Sherilyn Banker 07/17/2022, 3:10 PM

## 2022-07-17 NOTE — Progress Notes (Signed)
ANTICOAGULATION CONSULT NOTE  Pharmacy Consult for Heparin infusion > apixaban Indication: atrial fibrillation  Allergies  Allergen Reactions   Meclizine Diarrhea   Penicillins Rash and Other (See Comments)    Has patient had a PCN reaction causing immediate rash, facial/tongue/throat swelling, SOB or lightheadedness with hypotension: no Has patient had a PCN reaction causing severe rash involving mucus membranes or skin necrosis: No Has patient had a PCN reaction that required hospitalization No Has patient had a PCN reaction occurring within the last 10 years: No If all of the above answers are "NO", then may proceed with Cephalosporin use.     Patient Measurements: Height: 5\' 7"  (170.2 cm) Weight: 89.1 kg (196 lb 6.9 oz) IBW/kg (Calculated) : 61.6 Heparin Dosing Weight: 79.7 kg  Vital Signs: Temp: 97.7 F (36.5 C) (05/24 1030) Temp Source: Oral (05/24 1030) BP: 100/67 (05/24 1030) Pulse Rate: 60 (05/24 1030)  Labs: Recent Labs    07/14/22 1856 07/14/22 1914 07/15/22 0140 07/15/22 1202 07/15/22 1527 07/15/22 1541 07/16/22 0817 07/17/22 0242  HGB 13.6  --  12.4  --    < > 12.9 12.4 12.3  HCT 42.2  --  38.0  --    < > 38.0 38.1 37.9  PLT 230  --  201  --   --   --  224 231  APTT  --   --  104*  --   --   --   --   --   LABPROT 15.8*  --   --   --   --   --   --   --   INR 1.2  --   --   --   --   --   --   --   HEPARINUNFRC  --   --  0.64 0.80*  --   --  0.35  --   CREATININE 0.93  --  0.91  --   --   --  1.05* 1.13*  TROPONINIHS  --  11  --   --   --   --   --   --    < > = values in this interval not displayed.     Estimated Creatinine Clearance: 46.3 mL/min (A) (by C-G formula based on SCr of 1.13 mg/dL (H)).   Medical History: Past Medical History:  Diagnosis Date   (HFpEF) heart failure with preserved ejection fraction (HCC)    A-fib (HCC)    Dx: 01/2022   Breast cancer (HCC) 02/24/2000   Bronchitis    Cancer (HCC)    CHF (congestive heart  failure) (HCC)    Chronic back pain    Depression    Dysrhythmia    GERD (gastroesophageal reflux disease)    Hematuria    Hyperlipidemia    Hypertension    Insomnia    Kidney stone    Personal history of chemotherapy    Personal history of radiation therapy    Pleural effusion    Pneumonia    Thyroid goiter    Vertigo      Assessment: 82 yo F presents to ED from cardiology clinic for acute HF exacerbation with volume overload. Pt has a history of persistent Afib and takes apixaban 5mg  BID prior to admission. Patient reports last dose of apixaban was taken on 07/07/22, but she also admits she has been confused lately. Pharmacy has been consulted to dose heparin infusion for Afib in anticipation of RHC on 5/22.  Heparin drip turned off  this AM, switched back to apixaban today.  Goal of Therapy:  Heparin level 0.3-0.7 units/mL Monitor platelets by anticoagulation protocol: Yes   Plan:  Stop IV heparin Resume apixaban 5 mg po BID.  Reece Leader, Colon Flattery, BCCP Clinical Pharmacist  07/17/2022 12:16 PM   [facility] pharmacy phone numbers are listed on [email or web address]

## 2022-07-21 ENCOUNTER — Telehealth: Payer: Self-pay

## 2022-07-21 NOTE — Transitions of Care (Post Inpatient/ED Visit) (Signed)
07/21/2022  Name: Theresa Garcia MRN: [id number] DOB: 11/16/1940  Today's TOC FU Call Status: Today's TOC FU Call Status:: Successful TOC FU Call Competed TOC FU Call Complete Date: 07/21/22  Transition Care Management Follow-up Telephone Call Date of Discharge: 07/17/22 Discharge Facility: [facility]) Type of Discharge: Inpatient Admission Primary Inpatient Discharge Diagnosis:: heart failure How have you been since you were released from the hospital?: Better Any questions or concerns?: No  Items Reviewed: Did you receive and understand the discharge instructions provided?: Yes Medications obtained,verified, and reconciled?: Yes (Medications Reviewed) Any new allergies since your discharge?: No Dietary orders reviewed?: Yes Do you have support at home?: Yes People in Home: sibling(s)  Medications Reviewed Today: Medications Reviewed Today     Reviewed by Karena Addison, LPN (Licensed Practical Nurse) on 07/21/22 at 1000  Med List Status: <None>   Medication Order Taking? Sig Documenting Provider Last Dose Status Informant  acetaminophen (TYLENOL) 500 MG tablet [id number] Yes Take 1,000 mg by mouth every 6 (six) hours as needed for mild pain or moderate pain.  [provider] Taking Active Family Member  acyclovir (ZOVIRAX) 400 MG tablet [id number] Yes Take 400 mg by mouth daily. [provider] Taking Active Family Member  alendronate (FOSAMAX) 70 MG tablet [id number] Yes TAKE 1 TABLET BY MOUTH EVERY 7  DAYS WITH A FULL GLASS OF WATER  ON AN EMPTY STOMACH  Patient taking differently: Take 70 mg by mouth once a week.   Kerri Perches, MD Taking Active Family Member           Med Note Sharlene Dory   Tue Jan 27, 2022  1:33 PM) Sundays   atorvastatin (LIPITOR) 20 MG tablet [id number] Yes TAKE 1 TABLET BY MOUTH IN THE  Yetta Glassman, MD Taking Active Family Member  Calcium Carb-Cholecalciferol (CALTRATE 600+D3) 600-20 MG-MCG TABS  [id number] Yes Take 1 tablet by mouth daily. Kerri Perches, MD Taking Active [facility] Member  Elastic Bandages & Supports MISC [id number] Yes 1 Package by Does not apply route as directed. Sharlene Dory, NP Taking Active Family Member  ELIQUIS 5 MG TABS tablet [id number] Yes TAKE 1 TABLET BY MOUTH TWICE  DAILY Kerri Perches, MD Taking Active Family Member  FLUoxetine (PROZAC) 10 MG capsule [id number] Yes TAKE 1 CAPSULE BY MOUTH DAILY Kerri Perches, MD Taking Active Family Member  furosemide (LASIX) 20 MG tablet [id number] Yes Take 3 tablets (60 mg total) by mouth 2 (two) times daily. Cleora Fleet, MD Taking Active Family Member  meclizine (ANTIVERT) 25 MG tablet [id number] Yes TAKE 1 TABLET BY MOUTH THREE TIMES DAILY AS NEEDED FOR DIZZINESS  Patient taking differently: Take 25 mg by mouth 3 (three) times daily as needed for dizziness.   Kerri Perches, MD Taking Active Family Member  Misc. Devices (TOILET SAFETY [place]) [place] [id number] Yes 1 each by Does not apply route daily as needed. Eustace Moore, MD Taking Active Family Member  ondansetron [facility]) 4 MG tablet [id number] Yes Take 4 mg by mouth as needed for vomiting or nausea. [provider] Taking Active Family Member  potassium chloride SA (KLOR-CON M) 10 MEQ tablet [id number] Yes Take 1 tablet (10 mEq total) by mouth every other day. Cleora Fleet, MD Taking Active Family Member  temazepam (RESTORIL) 30 MG capsule [id number] Yes TAKE 1 CAPSULE BY MOUTH EVERY  NIGHT AT BEDTIME Kerri Perches, MD Taking Active Family Member  vitamin C (ASCORBIC ACID) 500 MG  tablet [id number] Yes Take 500 mg by mouth daily. [provider] Taking Active Family Member  Vitamin D, Ergocalciferol, (DRISDOL) 1.25 MG (50000 UNIT) CAPS capsule [id number] Yes TAKE 1 CAPSULE BY MOUTH ONCE  WEEKLY Kerri Perches, MD Taking Active Family Member            Home Care and Equipment/Supplies: Were Home Health  Services Ordered?: NA Any new equipment or medical supplies ordered?: NA  Functional Questionnaire: Do you need assistance with bathing/showering or dressing?: No Do you need assistance with meal preparation?: No Do you need assistance with eating?: No Do you have difficulty maintaining continence: No Do you need assistance with getting out of bed/getting out of a chair/moving?: No Do you have difficulty managing or taking your medications?: No  Follow up appointments reviewed: PCP Follow-up appointment confirmed?: NA MD Provider Line [phone] Given: No Date of Specialist follow-up appointment?: 07/27/22 Follow-Up Specialty Provider:: Heart and vascular Do you need transportation to your follow-up appointment?: No Do you understand care options if your condition(s) worsen?: Yes-patient verbalized understanding    SIGNATURE Karena Addison, LPN [facility] Nurse Health Advisor Direct Dial [phone]

## 2022-07-22 ENCOUNTER — Encounter: Payer: Self-pay | Admitting: Family Medicine

## 2022-07-23 NOTE — Progress Notes (Signed)
ADVANCED HF CLINIC CONSULT NOTE   Primary Care: Kerri Perches, MD Primary Cardiologist: Marjo Bicker, MD HF Cardiologist: Dr. Gala Romney  HPI: Theresa Garcia is a 82 y.o. AAF w/ h/o HFpEF, chronic Rt sided heart failure w/ severe functional TR, mod MR, pulmonary HTN, persistent atrial fibrillation, HTN and h/o breast cancer in 2002 treated w/ chemoradiation. Had remote LHC done in [place] in 2009 that showed normal coronaries.    Had recent admission at [facility] earlier this month for ADHF. Echo showed preserved LVEF and moderately enlarged RV w/ mod systolic dysfunction and severe TR due to annular dilation. In persistent Afib w/ CVR. Was diuresed w/ IV Lasix and referred to structural heart clinic at d/c to discuss treatment options for TR.    Was seen in clinic yesterday by Dr. Lynnette Caffey and noted to be marked volume overloaded/ in ADHF and was direct admitted to [facility] for further management/ w/u and AHF team consultation. She was diuresed with IV lasix, eventually transitioned to lasix 60 mg bid. Underwent R/LHC showing normal cors, normal left-sided filling pressures with mildly elevated R sided pressures, mildly reduced CO, no evidence of intracardiac shunting, hemodynamics suggested TR 2/2 annular dilatation. TEE showed EF 55-60%, RV moderately reduced, TR with severe malcoaptation, with coaptation gap 1.101.2 cm and wide open TR. She was discharged home, weight 196 lbs.  Today she returns for post hospital HF follow up with her sister and brother. She has SOB walking around her house with a cane. Overall feeling fine. Denies increasing SOB, CP, dizziness, edema, or PND/Orthopnea. Appetite ok. No fever or chills. Weight at home 193 pounds. Taking all medications.    Cardiac Studies: TEE- Severe malcoaptation. With coaptation gap 1.1-1.2 cm and wide-open TR      R/LHC 05/22: Findings:  Ao = 96/58 (72)  LV = 101/4 RA =  11 with prominent v waves (ventricularized) RV = 35/6 PA =  34/9 (18) PCW = 3 Fick cardiac output/index = 4.7/2.4 PVR = 3.2 WU Ao sat = 93% PA sat = 57%, 60% SVC sat 59%  Assessment: 1. Normal coronaries 2. LVEF 65% 3. Severe TR  4. Normal left-sided pressures with mildly elevated R-sided pressures  5. Mildly reduced CO 6. No evidence of intracardiac shunting  Plan/Discussion:   Hemodynamics suggest TR secondary to annular dilation with no evidence of underlying PAH or restriction. Proceed with TEE.        Review of Systems: [y] = yes, [ ]  = no   General: Weight gain [ ] ; Weight loss [ ] ; Anorexia [ ] ; Fatigue [ ] ; Fever [ ] ; Chills [ ] ; Weakness [ ]   Cardiac: Chest pain/pressure [ ] ; Resting SOB [ ] ; Exertional SOB [ ] ; Orthopnea [ ] ; Pedal Edema [ ] ; Palpitations [ ] ; Syncope [ ] ; Presyncope [ ] ; Paroxysmal nocturnal dyspnea[ ]   Pulmonary: Cough [ ] ; Wheezing[ ] ; Hemoptysis[ ] ; Sputum [ ] ; Snoring [ ]   GI: Vomiting[ ] ; Dysphagia[ ] ; Melena[ ] ; Hematochezia [ ] ; Heartburn[ ] ; Abdominal pain [ ] ; Constipation [ ] ; Diarrhea [ ] ; BRBPR [ ]   GU: Hematuria[ ] ; Dysuria [ ] ; Nocturia[ ]   Vascular: Pain in legs with walking [ ] ; Pain in feet with lying flat [ ] ; Non-healing sores [ ] ; Stroke [ ] ; TIA [ ] ; Slurred speech [ ] ;  Neuro: Headaches[ ] ; Vertigo[ ] ; Seizures[ ] ; Paresthesias[ ] ;Blurred vision [ ] ; Diplopia [ ] ; Vision changes [ ]   Ortho/Skin: Arthritis [ ] ; Joint pain [ ] ; Muscle pain [ ] ;  Joint swelling [ ] ; Back Pain [ ] ; Rash [ ]   Psych: Depression[ ] ; Anxiety[ ]   Heme: Bleeding problems [ ] ; Clotting disorders [ ] ; Anemia [ ]   Endocrine: Diabetes [ ] ; Thyroid dysfunction[ ]    Past Medical History:  Diagnosis Date   (HFpEF) heart failure with preserved ejection fraction (HCC)    A-fib (HCC)    Dx: 01/2022   Breast cancer (HCC) 02/24/2000   Bronchitis    Cancer (HCC)    CHF (congestive heart failure) (HCC)    Chronic back pain    Depression    Dysrhythmia    GERD (gastroesophageal reflux disease)    Hematuria     Hyperlipidemia    Hypertension    Insomnia    Kidney stone    Personal history of chemotherapy    Personal history of radiation therapy    Pleural effusion    Pneumonia    Thyroid goiter    Vertigo     Current Outpatient Medications  Medication Sig Dispense Refill   acetaminophen (TYLENOL) 500 MG tablet Take 1,000 mg by mouth every 6 (six) hours as needed for mild pain or moderate pain.      acyclovir (ZOVIRAX) 400 MG tablet Take 400 mg by mouth daily.     alendronate (FOSAMAX) 70 MG tablet TAKE 1 TABLET BY MOUTH EVERY 7  DAYS WITH A FULL GLASS OF WATER  ON AN EMPTY STOMACH (Patient taking differently: Take 70 mg by mouth once a week.) 12 tablet 3   amLODipine-benazepril (LOTREL) 5-40 MG capsule Take 1 capsule by mouth daily.     atorvastatin (LIPITOR) 20 MG tablet TAKE 1 TABLET BY MOUTH IN THE  EVENING 90 tablet 3   Calcium Carb-Cholecalciferol (CALTRATE 600+D3) 600-20 MG-MCG TABS Take 1 tablet by mouth daily. 60 tablet 11   Elastic Bandages & Supports MISC 1 Package by Does not apply route as directed. 1 Package 0   ELIQUIS 5 MG TABS tablet TAKE 1 TABLET BY MOUTH TWICE  DAILY 180 tablet 3   FLUoxetine (PROZAC) 10 MG capsule TAKE 1 CAPSULE BY MOUTH DAILY 90 capsule 3   furosemide (LASIX) 20 MG tablet Take 3 tablets (60 mg total) by mouth 2 (two) times daily. 180 tablet 1   meclizine (ANTIVERT) 25 MG tablet TAKE 1 TABLET BY MOUTH THREE TIMES DAILY AS NEEDED FOR DIZZINESS (Patient taking differently: Take 25 mg by mouth 3 (three) times daily as needed for dizziness.) 30 tablet 0   Misc. Devices (TOILET SAFETY FRAME) MISC 1 each by Does not apply route daily as needed. 1 each 0   ondansetron (ZOFRAN) 4 MG tablet Take 4 mg by mouth as needed for vomiting or nausea.     potassium chloride SA (KLOR-CON M) 10 MEQ tablet Take 1 tablet (10 mEq total) by mouth every other day. 15 tablet 1   temazepam (RESTORIL) 30 MG capsule TAKE 1 CAPSULE BY MOUTH EVERY  NIGHT AT BEDTIME 90 capsule 3   vitamin C  (ASCORBIC ACID) 500 MG tablet Take 500 mg by mouth daily.     Vitamin D, Ergocalciferol, (DRISDOL) 1.25 MG (50000 UNIT) CAPS capsule TAKE 1 CAPSULE BY MOUTH ONCE  WEEKLY 13 capsule 3   No current facility-administered medications for this encounter.    Allergies  Allergen Reactions   Meclizine Diarrhea   Penicillins Rash and Other (See Comments)    Has patient had a PCN reaction causing immediate rash, facial/tongue/throat swelling, SOB or lightheadedness with hypotension: no Has patient  had a PCN reaction causing severe rash involving mucus membranes or skin necrosis: No Has patient had a PCN reaction that required hospitalization No Has patient had a PCN reaction occurring within the last 10 years: No If all of the above answers are "NO", then may proceed with Cephalosporin use.       Social History   Socioeconomic History   Marital status: Divorced    Spouse name: Not on file   Number of children: 2   Years of education: Not on file   Highest education level: Not on file  Occupational History   Not on file  Tobacco Use   Smoking status: Former    Packs/day: 1.00    Years: 5.00    Additional pack years: 0.00    Total pack years: 5.00    Types: Cigarettes    Quit date: 09/07/1980    Years since quitting: 41.9   Smokeless tobacco: Never   Tobacco comments:    quit 25+ years ago  Vaping Use   Vaping Use: Never used  Substance and Sexual Activity   Alcohol use: No    Alcohol/week: 0.0 standard drinks of alcohol   Drug use: No   Sexual activity: Not Currently  Other Topics Concern   Not on file  Social History Narrative   1 child deceased   Social Determinants of Health   Financial Resource Strain: Low Risk  (10/03/2020)   Overall Financial Resource Strain (CARDIA)    Difficulty of Paying Living Expenses: Not hard at all  Food Insecurity: No Food Insecurity (07/15/2022)   Hunger Vital Sign    Worried About Running Out of Food in the Last Year: Never true    Ran  Out of Food in the Last Year: Never true  Transportation Needs: No Transportation Needs (07/15/2022)   PRAPARE - Administrator, Civil Service (Medical): No    Lack of Transportation (Non-Medical): No  Physical Activity: Inactive (10/03/2020)   Exercise Vital Sign    Days of Exercise per Week: 0 days    Minutes of Exercise per Session: 0 min  Stress: No Stress Concern Present (10/03/2020)   [employer] of Occupational Health - Occupational Stress Questionnaire    Feeling of Stress : Not at all  Social Connections: Moderately Isolated (10/03/2020)   Social Connection and Isolation Panel [NHANES]    Frequency of Communication with Friends and Family: More than three times a week    Frequency of Social Gatherings with Friends and Family: Twice a week    Attends Religious Services: More than 4 times per year    Active Member of [employer] or Organizations: No    Attends Banker Meetings: Never    Marital Status: Divorced  Catering manager Violence: Not At Risk (07/15/2022)   Humiliation, Afraid, Rape, and Kick questionnaire    Fear of Current or Ex-Partner: No    Emotionally Abused: No    Physically Abused: No    Sexually Abused: No   Family History  Problem Relation Age of Onset   COPD Father    COPD Sister    COPD Sister    Diabetes Sister    Diabetes Brother    Seizures Mother    Bone cancer Brother    Heart disease Brother    Aneurysm Brother        brain   HIV/AIDS Brother    BP 96/70   Pulse 68   Wt 85.9 kg (189 lb 6.4 oz)  SpO2 96%   BMI 29.66 kg/m   Wt Readings from Last 3 Encounters:  07/27/22 85.9 kg (189 lb 6.4 oz)  07/17/22 89.1 kg (196 lb 6.9 oz)  07/14/22 85.9 kg (189 lb 6.4 oz)   PHYSICAL EXAM: General:  Well appearing. No respiratory difficulty HEENT: normal Neck: supple. no JVD. Carotids 2+ bilat; no bruits. No lymphadenopathy or thryomegaly appreciated. Cor: PMI nondisplaced. Regular rate & rhythm. No rubs, gallops or  murmurs. Lungs: clear Abdomen: soft, nontender, nondistended. No hepatosplenomegaly. No bruits or masses. Good bowel sounds. Extremities: no cyanosis, clubbing, rash, edema Neuro: alert & oriented x 3, cranial nerves grossly intact. moves all 4 extremities w/o difficulty. Affect pleasant.  ECG:   ASSESSMENT & PLAN: 1. Chronic HFpEF w/ Prominent RV Dysfunction  - Echo 5/24 60-65%, mod LVH, RV mod enlarged, systolic fx mod reduced, D-shaped LV c/w RV volume overload, severe RAE, severe TR, RVSP only 27 mmHg  - R/LHC 07/15/22: Normal cors, normal left-sided pressures with mildly elevated right-sided pressures, mildly reduced CO, no intracardiac shunting. Hemodynamics suggest TR secondary to annualar dilatation.  - admitted w/ volume overload and NYHA Class III symptoms  - Volume status stable.  - Continue Lasix 60 mg bid. - Continue spiro 12.5 mg daily as tolerated - Labs today.    2. Severe TR  - Severe RAE + Mod RV enlargement  - recent echo showed incomplete coaptation secondardy to annular dilatation  - RHC hemodynamics suggest TR d/t annular dilatation. No evidence of restriction or PAH. - TEE - Severe malcoaptation. With coaptation gap 1.1-1.2 cm and wide-open TR  - Structural Heart Team following and will need follow up.  - Will need referral to [facility]    3. Persistent Atrial Fibrillation  - Rate controlled - Continue Eliquis 5 mg bid.   5. Hypertension  - She's had soft BP in setting of RV dysfunction - Stopped amlodipine and benazepril - Continue spiro 12.5 mg daily   6. Mitral Regurgitation  - MV degenerative on recent echo w/ mild-mod MR  - RHC as above - TEE - mild restriction.   Follow up in 4-6 weeks with APP and 3 months with Dr. Gala Romney.   Prince Rome, FNP-BC 07/27/22

## 2022-07-27 ENCOUNTER — Ambulatory Visit (HOSPITAL_COMMUNITY)
Admission: RE | Admit: 2022-07-27 | Discharge: 2022-07-27 | Disposition: A | Discharge status: Home / Self Care | Payer: Medicare Other | Source: Ambulatory Visit | Attending: Family Medicine | Admitting: Family Medicine

## 2022-07-27 ENCOUNTER — Encounter (HOSPITAL_COMMUNITY): Payer: Self-pay

## 2022-07-27 VITALS — BP 96/70 | HR 68 | Wt 189.4 lb

## 2022-07-27 DIAGNOSIS — I4819 Other persistent atrial fibrillation: Secondary | ICD-10-CM | POA: Diagnosis not present

## 2022-07-27 DIAGNOSIS — I5032 Chronic diastolic (congestive) heart failure: Secondary | ICD-10-CM

## 2022-07-27 DIAGNOSIS — I252 Old myocardial infarction: Secondary | ICD-10-CM | POA: Insufficient documentation

## 2022-07-27 DIAGNOSIS — C50919 Malignant neoplasm of unspecified site of unspecified female breast: Secondary | ICD-10-CM | POA: Insufficient documentation

## 2022-07-27 DIAGNOSIS — I071 Rheumatic tricuspid insufficiency: Secondary | ICD-10-CM | POA: Diagnosis not present

## 2022-07-27 DIAGNOSIS — J40 Bronchitis, not specified as acute or chronic: Secondary | ICD-10-CM | POA: Insufficient documentation

## 2022-07-27 DIAGNOSIS — Z79899 Other long term (current) drug therapy: Secondary | ICD-10-CM | POA: Diagnosis not present

## 2022-07-27 DIAGNOSIS — I10 Essential (primary) hypertension: Secondary | ICD-10-CM

## 2022-07-27 DIAGNOSIS — I4891 Unspecified atrial fibrillation: Secondary | ICD-10-CM

## 2022-07-27 DIAGNOSIS — Z7901 Long term (current) use of anticoagulants: Secondary | ICD-10-CM | POA: Diagnosis not present

## 2022-07-27 DIAGNOSIS — I5082 Biventricular heart failure: Secondary | ICD-10-CM | POA: Insufficient documentation

## 2022-07-27 DIAGNOSIS — I2729 Other secondary pulmonary hypertension: Secondary | ICD-10-CM | POA: Diagnosis not present

## 2022-07-27 DIAGNOSIS — I110 Hypertensive heart disease with heart failure: Secondary | ICD-10-CM | POA: Insufficient documentation

## 2022-07-27 DIAGNOSIS — Z853 Personal history of malignant neoplasm of breast: Secondary | ICD-10-CM | POA: Diagnosis not present

## 2022-07-27 DIAGNOSIS — I081 Rheumatic disorders of both mitral and tricuspid valves: Secondary | ICD-10-CM | POA: Insufficient documentation

## 2022-07-27 DIAGNOSIS — I340 Nonrheumatic mitral (valve) insufficiency: Secondary | ICD-10-CM

## 2022-07-27 LAB — BASIC METABOLIC PANEL
Anion gap: 9 (ref 5–15)
BUN: 11 mg/dL (ref 8–23)
CO2: 21 mmol/L — ABNORMAL LOW (ref 22–32)
Calcium: 9.6 mg/dL (ref 8.9–10.3)
Chloride: 105 mmol/L (ref 98–111)
Creatinine, Ser: 0.87 mg/dL (ref 0.44–1.00)
GFR, Estimated: >60 mL/min (ref >60)
Glucose, Bld: 94 mg/dL (ref 70–99)
Potassium: 3.6 mmol/L (ref 3.5–5.1)
Sodium: 135 mmol/L (ref 135–145)

## 2022-07-27 LAB — CBC
HCT: 39.8 % (ref 36.0–46.0)
Hemoglobin: 12.8 g/dL (ref 12.0–15.0)
MCH: 29.5 pg (ref 26.0–34.0)
MCHC: 32.2 g/dL (ref 30.0–36.0)
MCV: 91.7 fL (ref 80.0–100.0)
Platelets: 250 10*3/uL (ref 150–400)
RBC: 4.34 MIL/uL (ref 3.87–5.11)
RDW: 15.9 % — ABNORMAL HIGH (ref 11.5–15.5)
WBC: 7.6 10*3/uL (ref 4.0–10.5)
nRBC: 0 % (ref 0.0–0.2)

## 2022-07-27 LAB — BRAIN NATRIURETIC PEPTIDE: B Natriuretic Peptide: 443.1 pg/mL — ABNORMAL HIGH (ref 0.0–100.0)

## 2022-07-27 NOTE — Patient Instructions (Addendum)
Thank you for coming in today  If you had labs drawn today, any labs that are abnormal the clinic will call you No news is good news  Medications: STOP Lotrel   Follow up appointments:  Your physician recommends that you schedule a follow-up appointment in:  4-6 weeks in clinic   Do the following things EVERYDAY: Weigh yourself in the morning before breakfast. Write it down and keep it in a log. Take your medicines as prescribed Eat low salt foods--Limit salt (sodium) to 2000 mg per day.  Stay as active as you can everyday Limit all fluids for the day to less than 2 liters   At the [facility], you and your health needs are our priority. As part of our continuing mission to provide you with exceptional heart care, we have created designated Provider Care Teams. These Care Teams include your primary Cardiologist (physician) and Advanced Practice Providers (APPs- Physician Assistants and Nurse Practitioners) who all work together to provide you with the care you need, when you need it.   You may see any of the following providers on your designated Care Team at your next follow up: Dr Arvilla Meres Dr Marca Ancona Dr. Marcos Eke, NP Robbie Lis, [place] [facility], [place] Brynda Peon, NP Karle Plumber, PharmD   Please be sure to bring in all your medications bottles to every appointment.    Thank you for choosing [facility]  If you have any questions or concerns before your next appointment please send [place] a message through [place] or call our office at [phone].    TO LEAVE A MESSAGE FOR THE NURSE SELECT OPTION 2, PLEASE LEAVE A MESSAGE INCLUDING: YOUR NAME DATE OF BIRTH CALL BACK NUMBER REASON FOR CALL**this is important as we prioritize the call backs  YOU WILL RECEIVE A CALL BACK THE SAME DAY AS LONG AS YOU CALL BEFORE 4:00 PM

## 2022-07-28 ENCOUNTER — Encounter (HOSPITAL_COMMUNITY): Payer: Self-pay

## 2022-07-28 ENCOUNTER — Telehealth (HOSPITAL_COMMUNITY): Payer: Self-pay | Admitting: Family Medicine

## 2022-07-28 NOTE — Telephone Encounter (Signed)
Tinnie Gens came by office and picked up copy to see if previous provider could sign.  Also asked to fax unsigned FMLA forms to HR.  (Faxed, confirmation sheet received)   Office still has an original copy just in case other provider is unable to sign off for patient / Tinnie Gens.

## 2022-07-28 NOTE — Telephone Encounter (Signed)
Notified patient's brother Harl Bowie) and sister (Arnettie) that I communicated with Dr. Lynnette Caffey and his office has arranged referral to [facility] regarding her TR. They were appreciative of call.

## 2022-08-03 ENCOUNTER — Ambulatory Visit: Payer: Medicare Other | Attending: Nurse Practitioner | Admitting: Nurse Practitioner

## 2022-08-03 ENCOUNTER — Encounter: Payer: Self-pay | Admitting: Nurse Practitioner

## 2022-08-03 VITALS — BP 126/84 | HR 71 | Ht 67.0 in | Wt 192.4 lb

## 2022-08-03 DIAGNOSIS — I4819 Other persistent atrial fibrillation: Secondary | ICD-10-CM | POA: Diagnosis not present

## 2022-08-03 DIAGNOSIS — I517 Cardiomegaly: Secondary | ICD-10-CM

## 2022-08-03 DIAGNOSIS — I4891 Unspecified atrial fibrillation: Secondary | ICD-10-CM

## 2022-08-03 DIAGNOSIS — I071 Rheumatic tricuspid insufficiency: Secondary | ICD-10-CM | POA: Diagnosis not present

## 2022-08-03 DIAGNOSIS — I38 Endocarditis, valve unspecified: Secondary | ICD-10-CM

## 2022-08-03 DIAGNOSIS — I5032 Chronic diastolic (congestive) heart failure: Secondary | ICD-10-CM

## 2022-08-03 DIAGNOSIS — E049 Nontoxic goiter, unspecified: Secondary | ICD-10-CM

## 2022-08-03 NOTE — Patient Instructions (Signed)
Medication Instructions:   Continue all current medications.   Labwork:  none  Testing/Procedures:  none  Follow-Up:  Dr. Jenene Slicker as planned   Any Other Special Instructions Will Be Listed Below (If Applicable).   If you need a refill on your cardiac medications before your next appointment, please call your pharmacy.

## 2022-08-03 NOTE — Progress Notes (Unsigned)
Cardiology Office Note:    Date:  08/03/2022  ID:  Theresa Garcia, Theresa Garcia 1940-12-05, MRN [id number]  PCP:  Kerri Perches, MD   [facility] Providers Cardiologist:  Marjo Bicker, MD     Referring MD: Kerri Perches, MD   CC: Here for follow-up  History of Present Illness:    Theresa Garcia is a 82 y.o. female with a hx of the following:  Hx of PE (chronic anticoagulation) Hypertension New onset A-fib (Dx 01/2022) HFpEF TR GERD History of breast cancer Type II HSV infection of vulvovaginal region Obesity Hyperlipidemia Vertigo Depression  Patient is a very pleasant 82 year old female with past medical history as mentioned above.  Underwent CCTA in [place], [place] in 2009 that demonstrated normal coronary arteries.  Previous patient of Dr. Purvis Sheffield.  Was evaluated in 2016 for evaluation of dizziness.  Denied any chest pain at that time.  She stated she seldom got dizzy and did not have to take meclizine in the recent past.  Had scarlet fever as a child.  EKG revealed normal sinus rhythm, no ischemic changes.  No medication changes were made.  Was told to follow-up as needed.  Admission in December 2023 for CHF exacerbation. Dx with A-fib 01/26/2022 prior to hospitalization.  Chest x-ray in ED showed small bilateral pleural effusions with overlying atelectasis.  CTA of chest was negative for PE, showed findings suggestive of right-sided dysfunction with small right-sided pleural effusions.  BNP 819.  TTE  showed normal EF, D-shaped left ventricle, consistent with right ventricular hyper bulimia right ventricular enlargement, left and right atrial size severely dilated, mild MR.  Received IV Lasix, switched to torsemide 40 mg daily.  Troponins low and flat.  Cardiology recommended to hold off beta-blockers due to mild bradycardia.  Would likely start SGLT2 inhibitor outpatient follow-up.    02/25/2022 - Saw her for hospital follow-up. Was doing  well. Admitted to goiter on her thyroid, not currently being managed. Started on Farxiga 10 mg daily. Note routed to Dr. Wyline Mood who recommended repeating Echo to evaluate RV enlargement. Echo revealed normal EF, no RWMA, mild LVH, indeterminate left ventricular diastolic parameters, findings consistent with right ventricular volume overload, mildly elevated PASP, estimated RVSP was 42.2 mmHg.  Had ED visit at [facility] on 03/26/2022 with leg swelling, rest of her PE was unremarkable.  Blood work was unremarkable.  CXR was clear of pleural effusions or interstitial edema.  She was given 80 mg of Lasix and diuresed well.  Was discharged later in stable condition.  Last seen by me on 03/30/2022. Was doing well.   Admitted 06/2022 for decompensated right heart failure, acute HFpEF, tx with IV Lasix. It was requested that she wear a 2 week event monitor prior to d/c to evaluate her persistent A-fib.   Saw Dr. Lynnette Caffey on 07/14/2022, was volume overloaded and directly admitted. Underwent right and left heart cath on 07/15/2022 that revealed normal coronary arteries, EF 65%, severe TR, mildly elevated R-sided pressures, midly reduced CO, and no evidence of intracardiac shunting, no evidence of underlying PAH. Advanced HF team was consulted. TEE showed EF normal, RV moderately reduced, TR with severe malcoaptation. She was referred to [facility] for possible tricuspid valve intervention.   Saw HF clinic on 07/27/2022, reported being fatigued and worn out with minimal activities, noted having SOB with walking around house. Was compliant with her medications. Amlodipine/Benazepril stopped in setting of soft BP's.   Today she presents for follow-up. She presents with  her brother and her sister. Her sister states she gets short of breath with activities, similar symptoms she endorsed at HF office visit last week. Continues to note fatigue. Denies any CP. Brother states she has not worn the monitor and Zio monitor is sitting  on counter in exam room. Denies any chest pain, palpitations, syncope, presyncope, dizziness, orthopnea, PND, significant weight changes, acute bleeding, or claudication. Her leg swelling is stable. Compliant with her medications.    Past Medical History:  Diagnosis Date   (HFpEF) heart failure with preserved ejection fraction (HCC)    A-fib (HCC)    Dx: 01/2022   Breast cancer (HCC) 02/24/2000   Bronchitis    Cancer (HCC)    CHF (congestive heart failure) (HCC)    Chronic back pain    Depression    Dysrhythmia    GERD (gastroesophageal reflux disease)    Hematuria    Hyperlipidemia    Hypertension    Insomnia    Kidney stone    Personal history of chemotherapy    Personal history of radiation therapy    Pleural effusion    Pneumonia    Thyroid goiter    Vertigo     Past Surgical History:  Procedure Laterality Date   ABDOMINAL HYSTERECTOMY     BREAST LUMPECTOMY Left 2002   BREAST SURGERY     left   CYSTOSCOPY/RETROGRADE/URETEROSCOPY/STONE EXTRACTION WITH BASKET  01/07/2011   Procedure: CYSTOSCOPY/RETROGRADE/URETEROSCOPY/STONE EXTRACTION WITH BASKET;  Surgeon: Ky Barban;  Location: [facility];  Service: Urology;  Laterality: Left;  Balloon Dilatation; stone given to family per MD   RIGHT/LEFT HEART CATH AND CORONARY ANGIOGRAPHY N/A 07/15/2022   Procedure: RIGHT/LEFT HEART CATH AND CORONARY ANGIOGRAPHY;  Surgeon: Dolores Patty, MD;  Location: [facility] INVASIVE CV LAB;  Service: Cardiovascular;  Laterality: N/A;   TEE WITHOUT CARDIOVERSION N/A 07/16/2022   Procedure: TRANSESOPHAGEAL ECHOCARDIOGRAM;  Surgeon: Dolores Patty, MD;  Location: [facility] INVASIVE CV LAB;  Service: Cardiovascular;  Laterality: N/A;   TUBAL LIGATION      Current Meds  Medication Sig   acetaminophen (TYLENOL) 500 MG tablet Take 1,000 mg by mouth every 6 (six) hours as needed for mild pain or moderate pain.    acyclovir (ZOVIRAX) 400 MG tablet TAKE 1 TABLET BY MOUTH TWICE  DAILY   alendronate  (FOSAMAX) 70 MG tablet TAKE 1 TABLET BY MOUTH EVERY 7  DAYS WITH A FULL GLASS OF WATER  ON AN EMPTY STOMACH   amLODipine (NORVASC) 5 MG tablet Take 1 tablet (5 mg total) by mouth daily.   atorvastatin (LIPITOR) 20 MG tablet TAKE 1 TABLET EVERY EVENING   Calcium Carb-Cholecalciferol (CALTRATE 600+D3) 600-20 MG-MCG TABS Take 1 tablet by mouth daily. Take one tablet by mouth one time daily   dapagliflozin propanediol (FARXIGA) 10 MG TABS tablet Take 1 tablet (10 mg total) by mouth daily before breakfast.   Elastic Bandages & Supports MISC 1 Package by Does not apply route as directed.   ELIQUIS 5 MG TABS tablet TAKE 1 TABLET BY MOUTH TWICE  DAILY   FLUoxetine (PROZAC) 10 MG capsule TAKE 1 CAPSULE BY MOUTH DAILY   meclizine (ANTIVERT) 25 MG tablet TAKE 1 TABLET BY MOUTH THREE TIMES DAILY AS NEEDED FOR DIZZINESS   metoprolol tartrate (LOPRESSOR) 25 MG tablet Take 12.5 mg by mouth 2 (two) times daily.   Misc. Devices (TOILET SAFETY FRAME) MISC 1 each by Does not apply route daily as needed.   temazepam (RESTORIL) 30 MG capsule TAKE  1 CAPSULE BY MOUTH EVERY  NIGHT AT BEDTIME   vitamin C (ASCORBIC ACID) 500 MG tablet Take 500 mg by mouth daily.   Vitamin D, Ergocalciferol, (DRISDOL) 1.25 MG (50000 UNIT) CAPS capsule TAKE 1 CAPSULE BY MOUTH ONCE  WEEKLY   potassium chloride SA (KLOR-CON M) 20 MEQ tablet Take 1 tablet (20 mEq total) by mouth daily.    Allergies:   Meclizine and Penicillins   Social History   Socioeconomic History   Marital status: Divorced    Spouse name: Not on file   Number of children: 2   Years of education: Not on file   Highest education level: Not on file  Occupational History   Not on file  Tobacco Use   Smoking status: Former    Packs/day: 1.00    Years: 5.00    Additional pack years: 0.00    Total pack years: 5.00    Types: Cigarettes    Quit date: 09/07/1980    Years since quitting: 41.9   Smokeless tobacco: Never   Tobacco comments:    quit 25+ years ago   Vaping Use   Vaping Use: Never used  Substance and Sexual Activity   Alcohol use: No    Alcohol/week: 0.0 standard drinks of alcohol   Drug use: No   Sexual activity: Not Currently  Other Topics Concern   Not on file  Social History Narrative   1 child deceased   Social Determinants of Health   Financial Resource Strain: Low Risk  (10/03/2020)   Overall Financial Resource Strain (CARDIA)    Difficulty of Paying Living Expenses: Not hard at all  Food Insecurity: No Food Insecurity (07/15/2022)   Hunger Vital Sign    Worried About Running Out of Food in the Last Year: Never true    Ran Out of Food in the Last Year: Never true  Transportation Needs: No Transportation Needs (07/15/2022)   PRAPARE - Administrator, Civil Service (Medical): No    Lack of Transportation (Non-Medical): No  Physical Activity: Inactive (10/03/2020)   Exercise Vital Sign    Days of Exercise per Week: 0 days    Minutes of Exercise per Session: 0 min  Stress: No Stress Concern Present (10/03/2020)   [employer] of Occupational Health - Occupational Stress Questionnaire    Feeling of Stress : Not at all  Social Connections: Moderately Isolated (10/03/2020)   Social Connection and Isolation Panel [NHANES]    Frequency of Communication with Friends and Family: More than three times a week    Frequency of Social Gatherings with Friends and Family: Twice a week    Attends Religious Services: More than 4 times per year    Active Member of [employer] or Organizations: No    Attends Engineer, structural: Never    Marital Status: Divorced     Family History: The patient's family history includes Aneurysm in her brother; Bone cancer in her brother; COPD in her father, sister, and sister; Diabetes in her brother and sister; HIV/AIDS in her brother; Heart disease in her brother; Seizures in her mother.  ROS:   Review of Systems  Constitutional: Negative.   HENT: Negative.         Goiter on  thyroid. See HPI.   Eyes: Negative.   Respiratory: Negative.    Cardiovascular: Negative.   Gastrointestinal: Negative.   Genitourinary: Negative.   Musculoskeletal: Negative.   Skin: Negative.   Neurological: Negative.   Endo/Heme/Allergies: Negative.  Psychiatric/Behavioral: Negative.      Please see the history of present illness.    All other systems reviewed and are negative.  EKGs/Labs/Other Studies Reviewed:    The following studies were reviewed today:   EKG:  EKG is not ordered today.   Echocardiogram on 03/11/2022:   1. Left ventricular ejection fraction, by estimation, is 60 to 65%. The  left ventricle has normal function. The left ventricle has no regional  wall motion abnormalities. There is mild concentric left ventricular  hypertrophy. Left ventricular diastolic  parameters are indeterminate. There is the interventricular septum is  flattened in diastole ('D' shaped left ventricle), consistent with right  ventricular volume overload. The average left ventricular global  longitudinal strain is -20.3 %. The global  longitudinal strain is normal.   2. Right ventricular systolic function is normal. The right ventricular  size is moderately enlarged. There is mildly elevated pulmonary artery  systolic pressure. The estimated right ventricular systolic pressure is  42.2 mmHg.   3. Right atrial size was mildly dilated.   4. The mitral valve is mildly degenerative. Mild to moderate mitral valve  regurgitation, posteriorly directed.   5. The tricuspid valve is abnormal with incomplete coaptation due to  annular dilatation. Tricuspid valve regurgitation is severe.   6. The aortic valve is tricuspid. Aortic valve regurgitation is trivial.  Aortic valve sclerosis is present, with no evidence of aortic valve  stenosis.   7. The inferior vena cava is dilated in size with <50% respiratory  variability, suggesting right atrial pressure of 15 mmHg.   Comparison(s): Prior  images reviewed side by side. Moderate RV enlargement  with tricuspid annular dilatation and severe tricuspid regurgitation. At  least mildly elevated estimated RVSP, could be underestimated.  Echocardiogram on January 28, 2022: 1. Left ventricular ejection fraction, by estimation, is 60 to 65%. The  left ventricle has normal function. The left ventricle has no regional  wall motion abnormalities. There is mild left ventricular hypertrophy.  Left ventricular diastolic function  could not be evaluated. There is the interventricular septum is flattened  in diastole ('D' shaped left ventricle), consistent with right ventricular  volume overload.   2. Right ventricular systolic function not well visualized but appears to  be normal. The right ventricular size is moderately enlarged.   3. Left atrial size was severely dilated.   4. Right atrial size was severely dilated.   5. The mitral valve is degenerative. Mild mitral valve regurgitation. No  evidence of mitral stenosis.   6. There is malcoaptation of tricuspid valve leaflets. The tricuspid  valve is abnormal. There is severe atrial functional tricuspid valve  regurgitation. Pulmonary artery systolic pressure cannot be calculated due  to rapid pressure equilibration between  right atrium and right ventricle.   7. The aortic valve is tricuspid. There is moderate calcification of the  aortic valve. Aortic valve regurgitation is mild. No aortic stenosis is  present.   8. The inferior vena cava is dilated in size with <50% respiratory  variability, suggesting right atrial pressure of 15 mmHg.   Comparison(s): No prior Echocardiogram.  CT angio chest (PE) on January 27, 2022: IMPRESSION: 1. No evidence of pulmonary embolism. 2. Small right-sided pleural effusion. 3. Right atrial enlargement with reflux of contrast into the hepatic veins. Findings can be seen in the setting of right heart dysfunction. Correlate with echocardiography.    2 view chest x-ray on January 27, 2022: Small bilateral pleural effusions with overlying atelectasis  Thyroid ultrasound on Jul 15, 2021: Enlarged multinodular thyroid gland.  A number of small and/or benign-appearing thyroid nodules as described do not meet criteria for further dedicated follow-up or biopsy.   Recent Labs: 07/14/2022: ALT 11; TSH 1.491 07/17/2022: Magnesium 1.9 07/27/2022: B Natriuretic Peptide 443.1; BUN 11; Creatinine, Ser 0.87; Hemoglobin 12.8; Platelets 250; Potassium 3.6; Sodium 135  Recent Lipid Panel    Component Value Date/Time   CHOL 85 07/03/2022 0421   CHOL 149 07/04/2021 1341   TRIG 61 07/03/2022 0421   HDL 35 (L) 07/03/2022 0421   HDL 53 07/04/2021 1341   CHOLHDL 2.4 07/03/2022 0421   VLDL 12 07/03/2022 0421   LDLCALC 38 07/03/2022 0421   LDLCALC 78 07/04/2021 1341   LDLCALC 79 04/25/2019 1053     Risk Assessment/Calculations:    CHA2DS2-VASc Score = 7   This indicates a 11.2% annual risk of stroke. The patient's score is based upon: CHF History: 1 HTN History: 1 Diabetes History: 0 Stroke History: 2 Vascular Disease History: 0 Age Score: 2 Gender Score: 1   The ASCVD Risk score (Arnett DK, et al., 2019) failed to calculate for the following reasons:   The valid total cholesterol range is 130 to 320 mg/dL   Physical Exam:    VS:  There were no vitals taken for this visit.    Wt Readings from Last 3 Encounters:  07/27/22 189 lb 6.4 oz (85.9 kg)  07/17/22 196 lb 6.9 oz (89.1 kg)  07/14/22 189 lb 6.4 oz (85.9 kg)     GEN: Obese, 82 y.o. female in no acute distress HEENT: Normal NECK: No JVD; No carotid bruits, thyroid goiter noted along right lobe of thyroid CARDIAC: S1/S2, irregular rhythm and regular rate, no murmurs, rubs, gallops; 2+ pulses throughout RESPIRATORY:  Clear to auscultation without rales, wheezing or rhonchi  MUSCULOSKELETAL:  trace, nonpitting edema along bilateral lower legs; No deformity  SKIN: Warm and  dry NEUROLOGIC:  Alert and oriented x 3 PSYCHIATRIC:  Normal affect   ASSESSMENT:    No diagnosis found.  PLAN:    In order of problems listed above:  HFpEF Hospitalized 01/2022 with CHF exacerbation. Echocardiogram showed normal EF, no RWMA, mild LVH, left ventricular diastolic function could not be evaluated.  The interventricular septum was flattened in diastole with left ventricle shaped and D formation, findings consistent with right ventricular volume overload.  Received IV Lasix and was switched to p.o. torsemide. Repeat echo showed consistent findings with right ventricular volume overload. Euvolemic and well compensated on exam. Low sodium diet, fluid restriction <2L, and daily weights encouraged. Educated to contact our office for weight gain of 2 lbs overnight or 5 lbs in one week. Heart healthy diet and regular cardiovascular exercise encouraged. Will increase Torsemide to 60 mg daily and increase Potassium to 30 mEq daily, repeat BMET in 1 week. Continue Farxiga and Lopressor. Consider starting low does Entresto at next OV.   A-fib Diagnosed with A-fib prior to hospital visit in 01/2022.  Cardiology recommended to hold off AV nodal agents/beta-blockers due to mild bradycardia; however, she is tolerating low dose Lopressor well.  Heart rate well-controlled today.  Continue Eliquis 5 mg twice daily for CHA2DS2-VASc score of 7 and current medication regimen.  Denies any bleeding issues and on appropriate dosage. Heart healthy diet and regular cardiovascular exercise encouraged.   MR, TR, RV enlargement Denies any symptoms. Echo 02/2022 revealed mild to moderate MR without stenosis and mal coaptation of the tricuspid valve leaflets,  severe atrial functional tricuspid valve regurgitation, PASP cannot be calculated.  Dr. Dina Rich was consulted previously and stated this was suspected due to long standing severe elevated left-sided pressures as suggested by severe BAE.  Dr. Dina Rich recommended that extensive workup for other causes of RV enlargement beyond severe longstanding diastolic dysfunction could be considered on outpatient basis. He recommended repeat Echo - results mentioned above.  Increasing Torsemide and potassium.   Thyroid goiter Noted on exam at patient's right lobe. Thyroid ultrasound on Jul 15, 2021 revealed enlarged multinodular thyroid gland.  A number of small and/or benign-appearing thyroid nodules did not meet criteria for further dedicated follow-up or biopsy. Continue to follow with PCP.   5. Leg edema Improved edema along bilateral lower legs. Continue wearing compression stockings. Low salt, heart healthy diet recommended. Leg elevation also recommended.   5. Disposition: Follow up with me in 4-6 weeks or sooner if anything changes.    Medication Adjustments/Labs and Tests Ordered: Current medicines are reviewed at length with the patient today.  Concerns regarding medicines are outlined above.  No orders of the defined types were placed in this encounter.  No orders of the defined types were placed in this encounter.   There are no Patient Instructions on file for this visit.    SignedSharlene Dory, NP  08/03/2022 12:35 PM    [facility]

## 2022-08-04 NOTE — Telephone Encounter (Signed)
Forms complete and given to the front desk

## 2022-08-04 NOTE — Progress Notes (Unsigned)
History of Present Illness: Theresa Garcia is a 82 y.o. female who presents today as a new patient at [facility] [place]. - GU /GYN History: 1. Kidney stones.  She reports chief complaint of microscopic hematuria. - 07/11/2022: UA showed 5 RBC/hpf. Negative urine culture.  ***no recent abdominal imaging; most likely kidney stone but needs regular workup w/ CTU and cysto  Today: She {Actions; denies-reports:120008} dysuria. She urinates*** times per day. She {Actions; denies-reports:120008} urgency. She {Actions; denies-reports:120008} the need to strain to void. She {Actions; denies-reports:120008} sensations of incomplete emptying. She {Actions; denies-reports:120008} abdominal pain. She {Actions; denies-reports:120008} flank pain. She {Actions; denies-reports:120008} fevers.  She {Actions; denies-reports:120008} prior history of gross hematuria.  She {Actions; denies-reports:120008} history of kidney stones.  She {Actions; denies-reports:120008} history of pyelonephritis.  She {Actions; denies-reports:120008} history of recent or recurrent UTI. She {Actions; denies-reports:120008} history of GU malignancy or pelvic radiation.  She {Actions; denies-reports:120008} history of autoimmune disease. She {Actions; denies-reports:120008} history of smoking (quit***; smoked*** ppd x*** years). She {Actions; denies-reports:120008} known occupational risks. She {Actions; denies-reports:120008} recent vigorous exercise which they think may be contributory to hematuria. She {Actions; denies-reports:120008} any recent trauma or prolonged pressure to the perineal area. She {Actions; denies-reports:120008} recent illness. She {Actions; denies-reports:120008} taking anticoagulants (***).  Fall Screening: Do you usually have a device to assist in your mobility? {yes/no:20286} ***cane / ***walker / ***wheelchair  Medications: Current Outpatient Medications  Medication Sig Dispense Refill    acetaminophen (TYLENOL) 500 MG tablet Take 1,000 mg by mouth every 6 (six) hours as needed for mild pain or moderate pain.      acyclovir (ZOVIRAX) 400 MG tablet Take 400 mg by mouth daily.     alendronate (FOSAMAX) 70 MG tablet TAKE 1 TABLET BY MOUTH EVERY 7  DAYS WITH A FULL GLASS OF WATER  ON AN EMPTY STOMACH (Patient taking differently: Take 70 mg by mouth once a week.) 12 tablet 3   atorvastatin (LIPITOR) 20 MG tablet TAKE 1 TABLET BY MOUTH IN THE  EVENING 90 tablet 3   Calcium Carb-Cholecalciferol (CALTRATE 600+D3) 600-20 MG-MCG TABS Take 1 tablet by mouth daily. 60 tablet 11   Elastic Bandages & Supports MISC 1 Package by Does not apply route as directed. 1 Package 0   ELIQUIS 5 MG TABS tablet TAKE 1 TABLET BY MOUTH TWICE  DAILY 180 tablet 3   FLUoxetine (PROZAC) 10 MG capsule TAKE 1 CAPSULE BY MOUTH DAILY 90 capsule 3   furosemide (LASIX) 20 MG tablet Take 3 tablets (60 mg total) by mouth 2 (two) times daily. 180 tablet 1   meclizine (ANTIVERT) 25 MG tablet TAKE 1 TABLET BY MOUTH THREE TIMES DAILY AS NEEDED FOR DIZZINESS (Patient taking differently: Take 25 mg by mouth 3 (three) times daily as needed for dizziness.) 30 tablet 0   Misc. Devices (TOILET SAFETY FRAME) MISC 1 each by Does not apply route daily as needed. 1 each 0   ondansetron (ZOFRAN) 4 MG tablet Take 4 mg by mouth as needed for vomiting or nausea.     potassium chloride SA (KLOR-CON M) 10 MEQ tablet Take 1 tablet (10 mEq total) by mouth every other day. 15 tablet 1   temazepam (RESTORIL) 30 MG capsule TAKE 1 CAPSULE BY MOUTH EVERY  NIGHT AT BEDTIME 90 capsule 3   vitamin C (ASCORBIC ACID) 500 MG tablet Take 500 mg by mouth daily.     Vitamin D, Ergocalciferol, (DRISDOL) 1.25 MG (50000 UNIT) CAPS capsule TAKE 1 CAPSULE BY  MOUTH ONCE  WEEKLY 13 capsule 3   No current facility-administered medications for this visit.    Allergies: Allergies  Allergen Reactions   Meclizine Diarrhea   Penicillins Rash and Other (See  Comments)    Has patient had a PCN reaction causing immediate rash, facial/tongue/throat swelling, SOB or lightheadedness with hypotension: no Has patient had a PCN reaction causing severe rash involving mucus membranes or skin necrosis: No Has patient had a PCN reaction that required hospitalization No Has patient had a PCN reaction occurring within the last 10 years: No If all of the above answers are "NO", then may proceed with Cephalosporin use.     Past Medical History:  Diagnosis Date   (HFpEF) heart failure with preserved ejection fraction (HCC)    A-fib (HCC)    Dx: 01/2022   Breast cancer (HCC) 02/24/2000   Bronchitis    Cancer (HCC)    CHF (congestive heart failure) (HCC)    Chronic back pain    Depression    Dysrhythmia    GERD (gastroesophageal reflux disease)    Hematuria    Hyperlipidemia    Hypertension    Insomnia    Kidney stone    Personal history of chemotherapy    Personal history of radiation therapy    Pleural effusion    Pneumonia    Thyroid goiter    Vertigo    Past Surgical History:  Procedure Laterality Date   ABDOMINAL HYSTERECTOMY     BREAST LUMPECTOMY Left 2002   BREAST SURGERY     left   CYSTOSCOPY/RETROGRADE/URETEROSCOPY/STONE EXTRACTION WITH BASKET  01/07/2011   Procedure: CYSTOSCOPY/RETROGRADE/URETEROSCOPY/STONE EXTRACTION WITH BASKET;  Surgeon: Ky Barban;  Location: [facility];  Service: Urology;  Laterality: Left;  Balloon Dilatation; stone given to family per MD   RIGHT/LEFT HEART CATH AND CORONARY ANGIOGRAPHY N/A 07/15/2022   Procedure: RIGHT/LEFT HEART CATH AND CORONARY ANGIOGRAPHY;  Surgeon: Dolores Patty, MD;  Location: [facility] INVASIVE CV LAB;  Service: Cardiovascular;  Laterality: N/A;   TEE WITHOUT CARDIOVERSION N/A 07/16/2022   Procedure: TRANSESOPHAGEAL ECHOCARDIOGRAM;  Surgeon: Dolores Patty, MD;  Location: [facility] INVASIVE CV LAB;  Service: Cardiovascular;  Laterality: N/A;   TUBAL LIGATION     Family History  Problem  Relation Age of Onset   COPD Father    COPD Sister    COPD Sister    Diabetes Sister    Diabetes Brother    Seizures Mother    Bone cancer Brother    Heart disease Brother    Aneurysm Brother        brain   HIV/AIDS Brother    Social History   Socioeconomic History   Marital status: Divorced    Spouse name: Not on file   Number of children: 2   Years of education: Not on file   Highest education level: Not on file  Occupational History   Not on file  Tobacco Use   Smoking status: Former    Packs/day: 1.00    Years: 5.00    Additional pack years: 0.00    Total pack years: 5.00    Types: Cigarettes    Quit date: 09/07/1980    Years since quitting: 41.9   Smokeless tobacco: Never   Tobacco comments:    quit 25+ years ago  Vaping Use   Vaping Use: Never used  Substance and Sexual Activity   Alcohol use: No    Alcohol/week: 0.0 standard drinks of alcohol   Drug use: No  Sexual activity: Not Currently  Other Topics Concern   Not on file  Social History Narrative   1 child deceased   Social Determinants of Health   Financial Resource Strain: Low Risk  (10/03/2020)   Overall Financial Resource Strain (CARDIA)    Difficulty of Paying Living Expenses: Not hard at all  Food Insecurity: No Food Insecurity (07/15/2022)   Hunger Vital Sign    Worried About Running Out of Food in the Last Year: Never true    Ran Out of Food in the Last Year: Never true  Transportation Needs: No Transportation Needs (07/15/2022)   PRAPARE - Administrator, Civil Service (Medical): No    Lack of Transportation (Non-Medical): No  Physical Activity: Inactive (10/03/2020)   Exercise Vital Sign    Days of Exercise per Week: 0 days    Minutes of Exercise per Session: 0 min  Stress: No Stress Concern Present (10/03/2020)   [employer] of Occupational Health - Occupational Stress Questionnaire    Feeling of Stress : Not at all  Social Connections: Moderately Isolated (10/03/2020)    Social Connection and Isolation Panel [NHANES]    Frequency of Communication with Friends and Family: More than three times a week    Frequency of Social Gatherings with Friends and Family: Twice a week    Attends Religious Services: More than 4 times per year    Active Member of [employer] or Organizations: No    Attends Banker Meetings: Never    Marital Status: Divorced  Catering manager Violence: Not At Risk (07/15/2022)   Humiliation, Afraid, Rape, and Kick questionnaire    Fear of Current or Ex-Partner: No    Emotionally Abused: No    Physically Abused: No    Sexually Abused: No    SUBJECTIVE  Review of Systems Constitutional: Patient ***denies any unintentional weight loss or change in strength lntegumentary: Patient ***denies any rashes or pruritus Eyes: Patient denies ***dry eyes ENT: Patient ***denies dry mouth Cardiovascular: Patient ***denies chest pain or syncope Respiratory: Patient ***denies shortness of breath Gastrointestinal: Patient ***denies nausea, vomiting, constipation, or diarrhea Musculoskeletal: Patient ***denies muscle cramps or weakness Neurologic: Patient ***denies convulsions or seizures Psychiatric: Patient ***denies memory problems Allergic/Immunologic: Patient ***denies recent allergic reaction(s) Hematologic/Lymphatic: Patient denies bleeding tendencies Endocrine: Patient ***denies heat/cold intolerance  GU: As per HPI.  OBJECTIVE There were no vitals filed for this visit. There is no height or weight on file to calculate BMI.  Physical Examination  Constitutional: ***No obvious distress; patient is ***non-toxic appearing  Cardiovascular: ***No visible lower extremity edema.  Respiratory: The patient does ***not have audible wheezing/stridor; respirations do ***not appear labored  Gastrointestinal: Abdomen ***non-distended Musculoskeletal: ***Normal ROM of UEs  Skin: ***No obvious rashes/open sores  Neurologic: CN 2-12 grossly  ***intact Psychiatric: Answered questions ***appropriately with ***normal affect  Hematologic/Lymphatic/Immunologic: ***No obvious bruises or sites of spontaneous bleeding  UA: {Desc; negative/positive:13464} *** WBC/hpf, *** RBC/hpf, bacteria (***) *** nitrites, *** leukocytes, *** blood PVR: *** ml  ASSESSMENT No diagnosis found.  For asymptomatic microscopic hematuria we discussed possible etiologies including but not limited to: vigorous exercise, sexual activity, stone, trauma, blood thinner use, urinary tract infection, urethral irritation secondary to ***vaginal atrophy, chronic kidney disease, glomerulonephropathy, ***BPH, ***radiation cystitis, malignancy. ***We discussed pt's smoking as a risk factor for GU cancer and encouraged ***continued smoking cessation.***  We reviewed the AUA 2020 AMH guideline and risk stratification for this patient. Based on individual risk factors, pt was advised that the  recommended workup includes ***repeat UA in 6 months / ***RUS / ***CT urogram / ***cystoscopy. Pt decided to pursue this work-up and follow-up afterward to discuss the results and formulate a treatment plan based on the findings. All questions were answered.  *** AUA 2020 AMH guideline     ***if hematuria found to be r/t infection, UA should be repeated to ensure hematuria resolution after infection is treated  *** Likelihood of finding a clinically significant lesion = 13.8% with gross hematuria versus 3.1% with microscopic hematuria  *** If RBC casts or proteinuria seen on UA, consider nephrology referral to evaluate for glomerular disease   Will plan for follow up in ***months or sooner if needed. Pt verbalized understanding and agreement. All questions were answered.  PLAN Advised the following: *** ***No follow-ups on file.  No orders of the defined types were placed in this encounter.   It has been explained that the patient is to follow regularly with their PCP in  addition to all other providers involved in their care and to follow instructions provided by these respective offices. Patient advised to contact urology clinic if any urologic-pertaining questions, concerns, new symptoms or problems arise in the interim period.  There are no Patient Instructions on file for this visit.  Electronically signed by:  Donnita Falls, MSN, FNP-C, CUNP 08/04/2022 1:13 PM

## 2022-08-04 NOTE — Telephone Encounter (Signed)
Theresa Garcia wants a call back with status on forms.    Forms were placed back in provider box

## 2022-08-06 ENCOUNTER — Encounter: Payer: Self-pay | Admitting: Family Medicine

## 2022-08-06 ENCOUNTER — Ambulatory Visit: Payer: Medicare Other | Admitting: Urology

## 2022-08-06 ENCOUNTER — Ambulatory Visit (INDEPENDENT_AMBULATORY_CARE_PROVIDER_SITE_OTHER): Payer: Medicare Other | Admitting: Family Medicine

## 2022-08-06 ENCOUNTER — Encounter: Payer: Self-pay | Admitting: Urology

## 2022-08-06 VITALS — BP 142/87 | HR 73 | Temp 97.7°F

## 2022-08-06 VITALS — BP 123/80 | HR 85 | Ht 67.0 in | Wt 192.1 lb

## 2022-08-06 DIAGNOSIS — Z09 Encounter for follow-up examination after completed treatment for conditions other than malignant neoplasm: Secondary | ICD-10-CM

## 2022-08-06 DIAGNOSIS — I071 Rheumatic tricuspid insufficiency: Secondary | ICD-10-CM

## 2022-08-06 DIAGNOSIS — Z7409 Other reduced mobility: Secondary | ICD-10-CM

## 2022-08-06 DIAGNOSIS — R110 Nausea: Secondary | ICD-10-CM

## 2022-08-06 DIAGNOSIS — R3129 Other microscopic hematuria: Secondary | ICD-10-CM

## 2022-08-06 DIAGNOSIS — F5104 Psychophysiologic insomnia: Secondary | ICD-10-CM

## 2022-08-06 DIAGNOSIS — A6004 Herpesviral vulvovaginitis: Secondary | ICD-10-CM

## 2022-08-06 DIAGNOSIS — I5032 Chronic diastolic (congestive) heart failure: Secondary | ICD-10-CM

## 2022-08-06 DIAGNOSIS — Z87442 Personal history of urinary calculi: Secondary | ICD-10-CM | POA: Diagnosis not present

## 2022-08-06 DIAGNOSIS — I4819 Other persistent atrial fibrillation: Secondary | ICD-10-CM

## 2022-08-06 DIAGNOSIS — I10 Essential (primary) hypertension: Secondary | ICD-10-CM

## 2022-08-06 LAB — URINALYSIS, ROUTINE W REFLEX MICROSCOPIC
Bilirubin, UA: NEGATIVE
Glucose, UA: NEGATIVE
Ketones, UA: NEGATIVE
Nitrite, UA: NEGATIVE
Specific Gravity, UA: 1.01 (ref 1.005–1.030)
Urobilinogen, Ur: 1 mg/dL (ref 0.2–1.0)
pH, UA: 7 (ref 5.0–7.5)

## 2022-08-06 LAB — MICROSCOPIC EXAMINATION

## 2022-08-06 LAB — BLADDER SCAN AMB NON-IMAGING

## 2022-08-06 NOTE — Patient Instructions (Addendum)
F/U in 3 months, call if you need mesooner  Chem 7 and EGFr and magnesium level today, we will call tomorrow with result, will specifically address potassium dose  No zofran for nausea, all other medication as currently listed  Please wear compression hose every day while awake , you may take off at bedtime when sleeping  WORK on activity /movement program, GRADUAL !  Thanks for choosing [facility], we consider it a privelige to serve you.\]

## 2022-08-07 LAB — BMP8+EGFR
BUN/Creatinine Ratio: 15 (ref 12–28)
BUN: 13 mg/dL (ref 8–27)
CO2: 21 mmol/L (ref 20–29)
Calcium: 9.9 mg/dL (ref 8.7–10.3)
Chloride: 105 mmol/L (ref 96–106)
Creatinine, Ser: 0.87 mg/dL (ref 0.57–1.00)
Glucose: 87 mg/dL (ref 70–99)
Potassium: 4.1 mmol/L (ref 3.5–5.2)
Sodium: 142 mmol/L (ref 134–144)
eGFR: 68 mL/min/{1.73_m2} (ref >59)

## 2022-08-07 LAB — MAGNESIUM: Magnesium: 2 mg/dL (ref 1.6–2.3)

## 2022-08-09 MED ORDER — POTASSIUM CHLORIDE CRYS ER 10 MEQ PO TBCR
10.0000 meq | EXTENDED_RELEASE_TABLET | ORAL | 3 refills | Status: DC
Start: 2022-08-09 — End: 2022-12-25

## 2022-08-12 NOTE — Assessment & Plan Note (Signed)
Patient in for follow up of recent hospitalization. Discharge summary, and laboratory and radiology data are reviewed, and any questions or concerns  are discussed. Specific issues requiring follow up are specifically addressed.  

## 2022-08-12 NOTE — Assessment & Plan Note (Signed)
Controlled, no change in medication  

## 2022-08-12 NOTE — Assessment & Plan Note (Signed)
No s/s of acute decompensation at visit

## 2022-08-12 NOTE — Progress Notes (Signed)
   Theresa Garcia     MRN: [id number]      DOB: 10/08/1940  Chief Complaint  Patient presents with   Hospitalization Follow-up    Follow up    HPI Theresa Garcia is here for follow up of hospitalization from 5/21 to 07/17/2022 when she was directly admitted from Heart failure clinic to hospital as noted to in marked volume overload and acute heart failure During admission she had right and left heart cath  and coronary angiography as well as TEE Normal coronary arteries SEVERE tricuspid regurgitation , needs tricuspid valve intervention Family has since the d/c sought 2nd opinion/ consultation at [facility] as ait time for intervention  is several month, an video visit with [facility] is set for this week  ROS Denies recent fever or chills. Denies sinus pressure, nasal congestion, ear pain or sore throat. Denies chest congestion, productive cough or wheezing. Denies chest pains, palpitations,PND , orthopnea  and leg swelling Denies abdominal pain, nausea, vomiting,diarrhea or constipation.   Denies dysuria, frequency, hesitancy Chronic  limitation in mobility. Denies headaches, seizures, numbness, or tingling. Denies uncontrolled depression, anxiety or insomnia. Denies skin break down or rash.   PE  BP 123/80 (BP Location: Right Arm, Patient Position: Sitting, Cuff Size: Large)   Pulse 85   Ht 5\' 7"  (1.702 m)   Wt 192 lb 1.9 oz (87.1 kg)   SpO2 94%   BMI 30.09 kg/m   Patient alert and oriented and in no cardiopulmonary distress.  HEENT: No facial asymmetry, EOMI,     Neck supple .  Chest: Clear to auscultation bilaterally.  CVS: S1, S2 systolic murmur, no S3.IrRegular rate.  ABD: Soft non tender.   Ext: No edema  MS: Adequate though reduced  ROM spine, shoulders, hips and knees.  Skin: Intact, no ulcerations or rash noted.  Psych: Good eye contact, normal affect. Memory impaired  not anxious or depressed appearing.  CNS: CN 2-12 intact, power,  normal throughout.no focal  deficits noted.   Assessment & Plan  Hospital discharge follow-up Patient in for follow up of recent hospitalization. Discharge summary, and laboratory and radiology data are reviewed, and any questions or concerns  are discussed. Specific issues requiring follow up are specifically addressed.   Severe tricuspid regurgitation Severe, debilitating , recurrent hospitalizations in heart failure in past 6 months, definitive intervention planned  Family wlill have English treated at the facility which can have procedure done the soonest  Insomnia Controlled, no change in medication   Essential hypertension Controlled, no change in medication   (HFpEF) heart failure with preserved ejection fraction (HCC) No s/s of acute decompensation at visit  Poor mobility Recommend concious decision to start regular exercise movement , short spells 2 to 3 times daily  Type 2 HSV infection of vulvovaginal region Continue maintenance med  Atrial fibrillation (HCC) Maintained on eliquis

## 2022-08-12 NOTE — Assessment & Plan Note (Signed)
Recommend concious decision to start regular exercise movement , short spells 2 to 3 times daily

## 2022-08-12 NOTE — Assessment & Plan Note (Signed)
Severe, debilitating , recurrent hospitalizations in heart failure in past 6 months, definitive intervention planned  Family wlill have Zyana treated at the facility which can have procedure done the soonest

## 2022-08-12 NOTE — Assessment & Plan Note (Signed)
Maintained on eliquis 

## 2022-08-12 NOTE — Assessment & Plan Note (Signed)
Continue maintenance med

## 2022-08-14 ENCOUNTER — Ambulatory Visit: Payer: Medicare Other | Admitting: Internal Medicine

## 2022-08-20 ENCOUNTER — Telehealth: Payer: Self-pay | Admitting: Nurse Practitioner

## 2022-08-20 ENCOUNTER — Other Ambulatory Visit: Payer: Self-pay | Admitting: Nurse Practitioner

## 2022-08-20 DIAGNOSIS — I5032 Chronic diastolic (congestive) heart failure: Secondary | ICD-10-CM

## 2022-08-20 MED ORDER — SPIRONOLACTONE 25 MG PO TABS: 12.5000 mg | ORAL_TABLET | Freq: Every day | ORAL | 3 refills | Status: DC

## 2022-08-20 NOTE — Telephone Encounter (Signed)
Left detailed message to start spirolactone 12.5 Mg daily and follow up with a BMET in 1 week at [facility]. Spoke to patient earlier about this medication change

## 2022-08-20 NOTE — Telephone Encounter (Signed)
positive she is not taking spirolactone but she willcall back with a definate

## 2022-08-22 ENCOUNTER — Other Ambulatory Visit: Payer: Self-pay | Admitting: Family Medicine

## 2022-08-25 ENCOUNTER — Telehealth: Payer: Self-pay | Admitting: Family Medicine

## 2022-08-25 ENCOUNTER — Encounter (HOSPITAL_COMMUNITY): Payer: Self-pay

## 2022-08-25 ENCOUNTER — Ambulatory Visit (HOSPITAL_COMMUNITY)
Admission: RE | Admit: 2022-08-25 | Discharge: 2022-08-25 | Disposition: A | Discharge status: Home / Self Care | Payer: Medicare Other | Source: Ambulatory Visit | Attending: Internal Medicine | Admitting: Internal Medicine

## 2022-08-25 VITALS — BP 118/78 | HR 78 | Ht 67.0 in | Wt 191.8 lb

## 2022-08-25 DIAGNOSIS — I509 Heart failure, unspecified: Secondary | ICD-10-CM

## 2022-08-25 DIAGNOSIS — Z833 Family history of diabetes mellitus: Secondary | ICD-10-CM | POA: Insufficient documentation

## 2022-08-25 DIAGNOSIS — I38 Endocarditis, valve unspecified: Secondary | ICD-10-CM

## 2022-08-25 DIAGNOSIS — J40 Bronchitis, not specified as acute or chronic: Secondary | ICD-10-CM | POA: Insufficient documentation

## 2022-08-25 DIAGNOSIS — Z87891 Personal history of nicotine dependence: Secondary | ICD-10-CM | POA: Diagnosis not present

## 2022-08-25 DIAGNOSIS — I252 Old myocardial infarction: Secondary | ICD-10-CM | POA: Diagnosis not present

## 2022-08-25 DIAGNOSIS — I110 Hypertensive heart disease with heart failure: Secondary | ICD-10-CM | POA: Insufficient documentation

## 2022-08-25 DIAGNOSIS — I5032 Chronic diastolic (congestive) heart failure: Secondary | ICD-10-CM | POA: Insufficient documentation

## 2022-08-25 DIAGNOSIS — Z7901 Long term (current) use of anticoagulants: Secondary | ICD-10-CM | POA: Insufficient documentation

## 2022-08-25 DIAGNOSIS — I4819 Other persistent atrial fibrillation: Secondary | ICD-10-CM | POA: Diagnosis not present

## 2022-08-25 DIAGNOSIS — I10 Essential (primary) hypertension: Secondary | ICD-10-CM | POA: Diagnosis not present

## 2022-08-25 DIAGNOSIS — I071 Rheumatic tricuspid insufficiency: Secondary | ICD-10-CM

## 2022-08-25 DIAGNOSIS — I2729 Other secondary pulmonary hypertension: Secondary | ICD-10-CM | POA: Insufficient documentation

## 2022-08-25 DIAGNOSIS — Z79899 Other long term (current) drug therapy: Secondary | ICD-10-CM | POA: Diagnosis not present

## 2022-08-25 DIAGNOSIS — Z853 Personal history of malignant neoplasm of breast: Secondary | ICD-10-CM | POA: Diagnosis not present

## 2022-08-25 DIAGNOSIS — Z8249 Family history of ischemic heart disease and other diseases of the circulatory system: Secondary | ICD-10-CM | POA: Insufficient documentation

## 2022-08-25 DIAGNOSIS — I081 Rheumatic disorders of both mitral and tricuspid valves: Secondary | ICD-10-CM | POA: Insufficient documentation

## 2022-08-25 LAB — CBC
HCT: 41.3 % (ref 36.0–46.0)
Hemoglobin: 12.9 g/dL (ref 12.0–15.0)
MCH: 28.9 pg (ref 26.0–34.0)
MCHC: 31.2 g/dL (ref 30.0–36.0)
MCV: 92.4 fL (ref 80.0–100.0)
Platelets: 236 10*3/uL (ref 150–400)
RBC: 4.47 MIL/uL (ref 3.87–5.11)
RDW: 15.4 % (ref 11.5–15.5)
WBC: 7.9 10*3/uL (ref 4.0–10.5)
nRBC: 0 % (ref 0.0–0.2)

## 2022-08-25 LAB — BASIC METABOLIC PANEL
Anion gap: 12 (ref 5–15)
BUN: 10 mg/dL (ref 8–23)
CO2: 24 mmol/L (ref 22–32)
Calcium: 9.6 mg/dL (ref 8.9–10.3)
Chloride: 103 mmol/L (ref 98–111)
Creatinine, Ser: 1.01 mg/dL — ABNORMAL HIGH (ref 0.44–1.00)
GFR, Estimated: 57 mL/min — ABNORMAL LOW (ref >60)
Glucose, Bld: 113 mg/dL — ABNORMAL HIGH (ref 70–99)
Potassium: 3.8 mmol/L (ref 3.5–5.1)
Sodium: 139 mmol/L (ref 135–145)

## 2022-08-25 LAB — BRAIN NATRIURETIC PEPTIDE: B Natriuretic Peptide: 534.2 pg/mL — ABNORMAL HIGH (ref 0.0–100.0)

## 2022-08-25 MED ORDER — SPIRONOLACTONE 25 MG PO TABS: 12.5000 mg | ORAL_TABLET | Freq: Every day | ORAL | 3 refills | Status: DC

## 2022-08-25 NOTE — Progress Notes (Signed)
ADVANCED HF CLINIC NOTE  Primary Care: Kerri Perches, MD Primary Cardiologist: Marjo Bicker, MD HF Cardiologist: Dr. Gala Romney  HPI: Theresa Garcia is a 82 y.o. AAF w/ h/o HFpEF, chronic Rt sided heart failure w/ severe functional TR, mod MR, pulmonary HTN, persistent atrial fibrillation, HTN and h/o breast cancer in 2002 treated w/ chemoradiation. Had remote LHC done in [place] in 2009 that showed normal coronaries.    Admitted at [facility] for ADHF. Echo showed preserved LVEF and moderately enlarged RV w/ mod systolic dysfunction and severe TR due to annular dilation. In persistent Afib w/ CVR. Was diuresed w/ IV Lasix and referred to structural heart clinic at d/c to discuss treatment options for TR.    Was seen in clinic by Dr. Lynnette Caffey and noted to be marked volume overloaded in ADHF and was direct admitted to [facility] for further management/ w/u and AHF team consultation. She was diuresed with IV lasix. Underwent R/LHC showing normal cors, normal left-sided filling pressures with mildly elevated R sided pressures, mildly reduced CO, no evidence of intracardiac shunting, hemodynamics suggested TR 2/2 annular dilatation. TEE showed EF 55-60%, RV moderately reduced, TR with severe malcoaptation, with coaptation gap 1.101.2 cm and wide open TR. Eventually transitioned to lasix 60 mg bid. Plans for potential Evoque clip at [facility]. She was discharged home, weight 196 lbs.  Today she returns for AHF follow up with her brother and sister. Overall feeling tired. Denies palpitations, CP, dizziness, or PND/Orthopnea. Intt SOB with ADLs, sister helps her sometimes. Appetite good. No fever or chills. Has noticed intt swelling in her legs. Weight at home 191 pounds. Taking all medications. Denies ETOH, smoking.    Cardiac Studies: - Zio 5/24: Atrial Fibrillation occurred continuously (100% burden), ranging from 44-128 bpm  - cMRI (5/24): LVEF 48%, RVEF 48% with severe RV enlargement, functional TR  regurgitant fraction 56% severe TR. - TEE (5/24): EF 55-60%, prominent RV trabeculations, RV mildly reduced, no LAA thrombus. Severe malcoaptation. With coaptation gap 1.1-1.2 cm and wide-open TR  - R/LHC 07/15/22: normal cors, LVEF 65%, severe TR, normal left sided pressures with mildly elevated R-sided pressures, mildly reduced CO, no evidence of intracardiac shunting. Hemodynamics suggest TR secondary to annular dilation with no evidence of underlying PAH or restriction. Ao = 96/58 (72)  LV = 101/4 RA =  11 with prominent v waves (ventricularized) RV = 35/6 PA = 34/9 (18) PCW = 3 Fick cardiac output/index = 4.7/2.4 PVR = 3.2 WU Ao sat = 93% PA sat = 57%, 60% SVC sat 59%   Past Medical History:  Diagnosis Date   (HFpEF) heart failure with preserved ejection fraction (HCC)    A-fib (HCC)    Dx: 01/2022   Breast cancer (HCC) 02/24/2000   Bronchitis    Cancer (HCC)    CHF (congestive heart failure) (HCC)    Chronic back pain    Depression    Dysrhythmia    GERD (gastroesophageal reflux disease)    Hematuria    Hyperlipidemia    Hypertension    Insomnia    Kidney stone    Personal history of chemotherapy    Personal history of radiation therapy    Pleural effusion    Pneumonia    Thyroid goiter    Vertigo    Current Outpatient Medications  Medication Sig Dispense Refill   acetaminophen (TYLENOL) 500 MG tablet Take 1,000 mg by mouth every 6 (six) hours as needed for mild pain or moderate pain.  acyclovir (ZOVIRAX) 400 MG tablet TAKE 1 TABLET BY MOUTH FIVE TIMES DAILY (Patient taking differently: Take 400 mg by mouth daily.) 35 tablet 0   alendronate (FOSAMAX) 70 MG tablet TAKE 1 TABLET BY MOUTH EVERY 7  DAYS WITH A FULL GLASS OF WATER  ON AN EMPTY STOMACH (Patient taking differently: Take 70 mg by mouth once a week.) 12 tablet 3   atorvastatin (LIPITOR) 20 MG tablet TAKE 1 TABLET BY MOUTH IN THE  EVENING 90 tablet 3   Calcium Carb-Cholecalciferol (CALTRATE 600+D3) 600-20  MG-MCG TABS Take 1 tablet by mouth daily. 60 tablet 11   ELIQUIS 5 MG TABS tablet TAKE 1 TABLET BY MOUTH TWICE  DAILY 180 tablet 3   FLUoxetine (PROZAC) 10 MG capsule TAKE 1 CAPSULE BY MOUTH DAILY 90 capsule 3   furosemide (LASIX) 20 MG tablet Take 3 tablets (60 mg total) by mouth 2 (two) times daily. 180 tablet 1   meclizine (ANTIVERT) 25 MG tablet TAKE 1 TABLET BY MOUTH THREE TIMES DAILY AS NEEDED FOR DIZZINESS (Patient taking differently: Take 25 mg by mouth 3 (three) times daily as needed for dizziness.) 30 tablet 0   potassium chloride (KLOR-CON M) 10 MEQ tablet Take 1 tablet (10 mEq total) by mouth every other day. 15 tablet 3   spironolactone (ALDACTONE) 25 MG tablet Take 0.5 tablets (12.5 mg total) by mouth daily. (Patient not taking: Reported on 08/25/2022) 90 tablet 3   temazepam (RESTORIL) 30 MG capsule TAKE 1 CAPSULE BY MOUTH EVERY  NIGHT AT BEDTIME 90 capsule 3   vitamin C (ASCORBIC ACID) 500 MG tablet Take 500 mg by mouth daily.     Vitamin D, Ergocalciferol, (DRISDOL) 1.25 MG (50000 UNIT) CAPS capsule TAKE 1 CAPSULE BY MOUTH ONCE  WEEKLY 13 capsule 3   Elastic Bandages & Supports MISC 1 Package by Does not apply route as directed. (Patient not taking: Reported on 08/25/2022) 1 Package 0   Misc. Devices (TOILET SAFETY FRAME) MISC 1 each by Does not apply route daily as needed. (Patient not taking: Reported on 08/25/2022) 1 each 0   No current facility-administered medications for this encounter.   Allergies  Allergen Reactions   Meclizine Diarrhea   Penicillins Rash and Other (See Comments)    Has patient had a PCN reaction causing immediate rash, facial/tongue/throat swelling, SOB or lightheadedness with hypotension: no Has patient had a PCN reaction causing severe rash involving mucus membranes or skin necrosis: No Has patient had a PCN reaction that required hospitalization No Has patient had a PCN reaction occurring within the last 10 years: No If all of the above answers are "NO",  then may proceed with Cephalosporin use.    Social History   Socioeconomic History   Marital status: Divorced    Spouse name: Not on file   Number of children: 2   Years of education: Not on file   Highest education level: 12th grade  Occupational History   Not on file  Tobacco Use   Smoking status: Former    Packs/day: 1.00    Years: 5.00    Additional pack years: 0.00    Total pack years: 5.00    Types: Cigarettes    Quit date: 09/07/1980    Years since quitting: 41.9   Smokeless tobacco: Never   Tobacco comments:    quit 25+ years ago  Vaping Use   Vaping Use: Never used  Substance and Sexual Activity   Alcohol use: No    Alcohol/week: 0.0  standard drinks of alcohol   Drug use: No   Sexual activity: Not Currently  Other Topics Concern   Not on file  Social History Narrative   1 child deceased   Social Determinants of Health   Financial Resource Strain: Low Risk  (08/05/2022)   Overall Financial Resource Strain (CARDIA)    Difficulty of Paying Living Expenses: Not hard at all  Food Insecurity: No Food Insecurity (08/05/2022)   Hunger Vital Sign    Worried About Running Out of Food in the Last Year: Never true    Ran Out of Food in the Last Year: Never true  Transportation Needs: No Transportation Needs (08/05/2022)   PRAPARE - Administrator, Civil Service (Medical): No    Lack of Transportation (Non-Medical): No  Physical Activity: Unknown (08/05/2022)   Exercise Vital Sign    Days of Exercise per Week: 0 days    Minutes of Exercise per Session: Not on file  Stress: No Stress Concern Present (08/05/2022)   [employer] of Occupational Health - Occupational Stress Questionnaire    Feeling of Stress : Only a little  Social Connections: Moderately Isolated (08/05/2022)   Social Connection and Isolation Panel [NHANES]    Frequency of Communication with Friends and Family: More than three times a week    Frequency of Social Gatherings with Friends  and Family: Never    Attends Religious Services: Never    Database administrator or Organizations: Yes    Attends Banker Meetings: Never    Marital Status: Divorced  Catering manager Violence: Not At Risk (07/15/2022)   Humiliation, Afraid, Rape, and Kick questionnaire    Fear of Current or Ex-Partner: No    Emotionally Abused: No    Physically Abused: No    Sexually Abused: No   Family History  Problem Relation Age of Onset   COPD Father    COPD Sister    COPD Sister    Diabetes Sister    Diabetes Brother    Seizures Mother    Bone cancer Brother    Heart disease Brother    Aneurysm Brother        brain   HIV/AIDS Brother    BP 118/78 (BP Location: Left Arm, Patient Position: Sitting)   Pulse 78   Ht 5\' 7"  (1.702 m)   Wt 87 kg (191 lb 12.8 oz)   SpO2 97%   BMI 30.04 kg/m   Wt Readings from Last 3 Encounters:  08/25/22 87 kg (191 lb 12.8 oz)  08/06/22 87.1 kg (192 lb 1.9 oz)  08/03/22 87.3 kg (192 lb 6.4 oz)   PHYSICAL EXAM: General:  well appearing.  No respiratory difficulty HEENT: normal Neck: supple. JVD ~8 cm. Carotids 2+ bilat; no bruits. No lymphadenopathy or thyromegaly appreciated. Cor: PMI nondisplaced. Regular rate & irregular rhythm. No rubs, gallops. + MR murmur. Lungs: clear Abdomen: soft, nontender, nondistended. No hepatosplenomegaly. No bruits or masses. Good bowel sounds. Extremities: no cyanosis, clubbing, rash, trace BLE edema  Neuro: alert & oriented x 3, cranial nerves grossly intact. moves all 4 extremities w/o difficulty. Affect pleasant.   ECG (personally reviewed): a fib 70s (Personally reviewed)    ASSESSMENT & PLAN: 1. Chronic HFpEF w/ Prominent RV Dysfunction  - Echo (5/24): 60-65%, mod LVH, RV mod enlarged, systolic fx mod reduced, D-shaped LV c/w RV volume overload, severe RAE, severe TR, RVSP only 27 mmHg  - R/LHC (07/15/22): Normal cors, normal left-sided pressures with mildly  elevated right-sided pressures, mildly  reduced CO, no intracardiac shunting. Hemodynamics suggest TR secondary to annualar dilatation.  - cMRI (5/24): LVEF 48%, RVEF 48% with severe RV enlargement, functional TR regurgitant fraction 56% severe TR. - NYHA IIIb today, physical deconditioning contributory. Volume status stable.  - Continue Lasix 60 mg bid. - Restart spiro 12.5 mg daily. BMET today, repeat in 1 week.  - Consider addition of SGLT2i next, may be able to back off lasix some - Labs today.    2. Severe TR  - Severe RAE + Mod RV enlargement  - Recent echo showed incomplete coaptation secondardy to annular dilatation  - RHC (5/24)hemodynamics suggest TR d/t annular dilatation. No evidence of restriction or PAH. - TEE (5/24): severe malcoaptation. With coaptation gap 1.1-1.2 cm and wide-open TR  - Structural Heart team following, considering referral to [facility] for Evoque - Per Dr. Lynnette Caffey does not need f/u with him for now but did refer to [facility] for TR f/u, scheduled 08/31/22   3. Persistent Atrial Fibrillation  - Rate controlled - Continue Eliquis 5 mg bid. No bleeding issues - CBC today. - Zio 5/24: Atrial Fibrillation occurred continuously (100% burden), ranging from 44-128 bpm    4. Hypertension  - She's had soft BP in setting of RV dysfunction - Now off amlodipine/benazepril - Restart spiro 12.5 mg daily   5. Mitral Regurgitation  - MV degenerative on recent echo w/ mild-mod MR  - RHC as above - TEE w/ mild restriction.   Follow up in 4-6 weeks with PharmD to try to add SGLT2i and maybe decrease lasix. 3 months with Dr. Gala Romney.   Brynda Peon, AGACNP-BC  08/25/22 [facility] [address] Heart and Vascular [place] [address] [phone] (office) [phone] (fax)

## 2022-08-25 NOTE — Telephone Encounter (Signed)
Placard  Copied Noted Sleeved (put in provider box)

## 2022-08-25 NOTE — Patient Instructions (Addendum)
Medication Changes:  START: SPIRONOLACTONE 12.5mg  (1/2) TABLET ONCE DAILY   Lab Work:  Labs done today, your results will be available in MyChart, we will contact you for abnormal readings.  THEN RETURN IN 7-10 DAYS FOR REPEAT BLOOD WORK AT [facility]  Follow-Up in: 4-6 WEEKS WITH PHARMACY AS SCHEDULED  THEN   3 MONTHS WITH Dr. Gala Romney   PLEASE CALL OUR OFFICE AROUND SEPTEMBER 2024 TO GET THIS SCHEDULED. NUMBER TO OUR OFFICE IS [phone] OPTION 2   At the [facility], you and your health needs are our priority. We have a designated team specialized in the treatment of Heart Failure. This Care Team includes your primary Heart Failure Specialized Cardiologist (physician), Advanced Practice Providers (APPs- Physician Assistants and Nurse Practitioners), and Pharmacist who all work together to provide you with the care you need, when you need it.   You may see any of the following providers on your designated Care Team at your next follow up:  Dr. Arvilla Meres Dr. Marca Ancona Dr. Marcos Eke, NP Robbie Lis, [place] [facility] [address] [address] [place], [place] Brynda Peon, NP Karle Plumber, PharmD   Please be sure to bring in all your medications bottles to every appointment.   Need to Contact us:  If you have any questions or concerns before your next appointment please send [place] a message through [place] or call our office at [phone].    TO LEAVE A MESSAGE FOR THE NURSE SELECT OPTION 2, PLEASE LEAVE A MESSAGE INCLUDING: YOUR NAME DATE OF BIRTH CALL BACK NUMBER REASON FOR CALL**this is important as we prioritize the call backs  YOU WILL RECEIVE A CALL BACK THE SAME DAY AS LONG AS YOU CALL BEFORE 4:00 PM

## 2022-08-26 ENCOUNTER — Telehealth (HOSPITAL_COMMUNITY): Payer: Self-pay

## 2022-08-26 NOTE — Telephone Encounter (Signed)
  Baird Cancer, RN 08/26/2022  1:45 PM EDT Back to Top    Spoke with patients sister (okay per DPR) advised her of the below results and recommendations. Patient has prescription to go to [facility] in [place] next week to have this completed. Patients sister is aware of all instructions and verbalized understanding.

## 2022-08-26 NOTE — Telephone Encounter (Signed)
-----   Message from Alen Bleacher, NP sent at 08/25/2022  4:52 PM EDT ----- Labs stable. Suggest mild volume overload. Starting spiro today. Needs repeat BMET 1 week.

## 2022-08-27 ENCOUNTER — Other Ambulatory Visit: Payer: Self-pay | Admitting: Family Medicine

## 2022-08-27 ENCOUNTER — Encounter: Payer: Self-pay | Admitting: Internal Medicine

## 2022-09-02 NOTE — Telephone Encounter (Signed)
Forms picked up

## 2022-09-02 NOTE — Telephone Encounter (Signed)
Contacted patient at [phone] left voicemail message placards ready for pick up

## 2022-09-04 ENCOUNTER — Ambulatory Visit (HOSPITAL_COMMUNITY)
Admission: RE | Admit: 2022-09-04 | Discharge: 2022-09-04 | Disposition: A | Discharge status: Home / Self Care | Payer: Medicare Other | Source: Ambulatory Visit | Attending: Urology | Admitting: Urology

## 2022-09-04 DIAGNOSIS — R3129 Other microscopic hematuria: Secondary | ICD-10-CM | POA: Diagnosis not present

## 2022-09-04 MED ORDER — IOHEXOL 350 MG/ML SOLN
100.0000 mL | Freq: Once | INTRAVENOUS | Status: AC | PRN
  Administered 2022-09-04: 100 mL via INTRAVENOUS

## 2022-09-06 ENCOUNTER — Other Ambulatory Visit: Payer: Self-pay | Admitting: Family Medicine

## 2022-09-10 NOTE — Progress Notes (Signed)
Please let patient know that CT showed no acute findings - nothing to explain microscopic hematuria. Only GU-specific finding was benign renal cysts which do not require follow up. For further evaluation of her microscopic hematuria she is advised to proceed with cystoscopy as scheduled with Dr. Ronne Binning on 09/23/2022.

## 2022-09-14 ENCOUNTER — Telehealth: Payer: Self-pay

## 2022-09-14 NOTE — Transitions of Care (Post Inpatient/ED Visit) (Signed)
09/14/2022  Name: Theresa Garcia MRN: [id number] DOB: 06/01/1940  Today's TOC FU Call Status: Today's TOC FU Call Status:: Successful TOC FU Call Competed TOC FU Call Complete Date: 09/14/22  Transition Care Management Follow-up Telephone Call Date of Discharge: 09/13/22 Discharge Facility: Other ([facility]) Name of Other ([facility]) Discharge Facility: [facility] Type of Discharge: Emergency Department Primary Inpatient Discharge Diagnosis:: Congestive heart failure How have you been since you were released from the hospital?: Better (doing some better) Any questions or concerns?: No  Items Reviewed: Did you receive and understand the discharge instructions provided?: Yes Medications obtained,verified, and reconciled?: Yes (Medications Reviewed) Any new allergies since your discharge?: No Dietary orders reviewed?: Yes Do you have support at home?: Yes  Medications Reviewed Today: Medications Reviewed Today     Reviewed by Merleen Nicely, LPN (Licensed Practical Nurse) on 09/14/22 at 1425  Med List Status: <None>   Medication Order Taking? Sig Documenting Provider Last Dose Status Informant  acetaminophen (TYLENOL) 500 MG tablet [id number] Yes Take 1,000 mg by mouth every 6 (six) hours as needed for mild pain or moderate pain.  [provider] Taking Active Family Member  acyclovir (ZOVIRAX) 400 MG tablet [id number] Yes TAKE 1 TABLET BY MOUTH FIVE TIMES DAILY  Patient taking differently: Take 400 mg by mouth daily.   Kerri Perches, MD Taking Active   alendronate (FOSAMAX) 70 MG tablet [id number] Yes TAKE 1 TABLET BY MOUTH EVERY 7  DAYS ON AN EMPTY STOMACH WITH A  FULL GLASS OF WATER Kerri Perches, MD Taking Active   atorvastatin (LIPITOR) 20 MG tablet [id number] Yes TAKE 1 TABLET BY MOUTH IN THE  Yetta Glassman, MD Taking Active Family Member  Calcium Carb-Cholecalciferol (CALTRATE 600+D3) 600-20 MG-MCG TABS [id number] Yes Take 1 tablet by  mouth daily. Kerri Perches, MD Taking Active [facility] Member  Elastic Bandages & Supports MISC [id number] Yes 1 Package by Does not apply route as directed. Sharlene Dory, NP Taking Active Family Member  ELIQUIS 5 MG TABS tablet [id number] Yes TAKE 1 TABLET BY MOUTH TWICE  DAILY Kerri Perches, MD Taking Active   FLUoxetine (PROZAC) 10 MG capsule [id number] Yes TAKE 1 CAPSULE BY MOUTH DAILY Kerri Perches, MD Taking Active   furosemide (LASIX) 20 MG tablet [id number] Yes Take 3 tablets (60 mg total) by mouth 2 (two) times daily. Cleora Fleet, MD Taking Active Family Member  meclizine (ANTIVERT) 25 MG tablet [id number] Yes TAKE 1 TABLET BY MOUTH THREE TIMES DAILY AS NEEDED FOR DIZZINESS  Patient taking differently: Take 25 mg by mouth 3 (three) times daily as needed for dizziness.   Kerri Perches, MD Taking Active Family Member  Misc. Devices (TOILET SAFETY [place]) [place] [id number] Yes 1 each by Does not apply route daily as needed. Eustace Moore, MD Taking Active Family Member  potassium chloride (KLOR-CON M) 10 MEQ tablet [id number] Yes Take 1 tablet (10 mEq total) by mouth every other day. Kerri Perches, MD Taking Active   spironolactone (ALDACTONE) 25 MG tablet [id number] Yes Take 0.5 tablets (12.5 mg total) by mouth daily. Alen Bleacher, NP Taking Active   temazepam (RESTORIL) 30 MG capsule [id number] Yes TAKE 1 CAPSULE BY MOUTH EVERY  NIGHT AT BEDTIME Kerri Perches, MD Taking Active Family Member  vitamin C (ASCORBIC ACID) 500 MG tablet [id number] Yes Take 500 mg by mouth daily. [provider] Taking Active Family Member  Vitamin D, Ergocalciferol, (DRISDOL) 1.25 MG (50000 UNIT)  CAPS capsule [id number] Yes TAKE 1 CAPSULE BY MOUTH ONCE  WEEKLY Kerri Perches, MD Taking Active Family Member            Home Care and Equipment/Supplies: Were Home Health Services Ordered?: No Any new equipment or medical supplies ordered?: No  Functional  Questionnaire: Do you need assistance with bathing/showering or dressing?: No Do you need assistance with meal preparation?: No Do you need assistance with eating?: No Do you have difficulty maintaining continence: No Do you need assistance with getting out of bed/getting out of a chair/moving?: No Do you have difficulty managing or taking your medications?: No  Follow up appointments reviewed: PCP Follow-up appointment confirmed?: No MD Provider Line Number:[phone] Given: Yes Specialist Hospital Follow-up appointment confirmed?: Yes Date of Specialist follow-up appointment?: 09/17/22 Follow-Up Specialty Provider:: cardiology Do you need transportation to your follow-up appointment?: No Do you understand care options if your condition(s) worsen?: Yes-patient verbalized understanding    SIGNATURE  Woodfin Ganja LPN [facility] Nurse Health Advisor Direct Dial [phone]

## 2022-09-15 ENCOUNTER — Other Ambulatory Visit (HOSPITAL_COMMUNITY): Payer: Self-pay

## 2022-09-23 ENCOUNTER — Other Ambulatory Visit: Payer: Medicare Other | Admitting: Urology

## 2022-09-23 DIAGNOSIS — R3129 Other microscopic hematuria: Secondary | ICD-10-CM

## 2022-09-28 ENCOUNTER — Inpatient Hospital Stay (HOSPITAL_COMMUNITY): Admission: RE | Admit: 2022-09-28 | Payer: Medicare Other | Source: Ambulatory Visit

## 2022-09-28 ENCOUNTER — Encounter (HOSPITAL_COMMUNITY): Payer: Self-pay

## 2022-10-05 ENCOUNTER — Encounter: Payer: Self-pay | Admitting: Internal Medicine

## 2022-10-05 ENCOUNTER — Ambulatory Visit: Payer: Medicare Other | Attending: Internal Medicine | Admitting: Internal Medicine

## 2022-10-05 ENCOUNTER — Other Ambulatory Visit: Payer: Self-pay | Admitting: Family Medicine

## 2022-10-05 VITALS — BP 108/68 | HR 76 | Ht 67.0 in | Wt 187.6 lb

## 2022-10-05 DIAGNOSIS — L299 Pruritus, unspecified: Secondary | ICD-10-CM

## 2022-10-05 DIAGNOSIS — I5032 Chronic diastolic (congestive) heart failure: Secondary | ICD-10-CM

## 2022-10-05 DIAGNOSIS — Z79899 Other long term (current) drug therapy: Secondary | ICD-10-CM | POA: Diagnosis not present

## 2022-10-05 NOTE — Patient Instructions (Addendum)
Medication Instructions:  Your physician recommends that you continue on your current medications as directed. Please refer to the Current Medication list given to you today. Please take your lasix as directed by your provider  Labwork: CMET today at [facility]  Testing/Procedures: none  Follow-Up: Your physician recommends that you schedule a follow-up appointment in: 6 months  Any Other Special Instructions Will Be Listed Below (If Applicable).  If you need a refill on your cardiac medications before your next appointment, please call your pharmacy.

## 2022-10-05 NOTE — Progress Notes (Unsigned)
Cardiology Office Note  Date: 10/05/2022   ID: Sybel, Kostiuk [birth year], MRN [id number]  PCP:  Kerri Perches, MD  Cardiologist:  Marjo Bicker, MD Electrophysiologist:  None   Reason for Office Visit: Follow-up of right heart failure   History of Present Illness: Theresa Garcia is a 82 y.o. female known to have HTN, HLD, A-fib, severe atrial functional TR with moderate RV enlargement and mild PASP is here for follow-up visit.  Patient was initially referred to cardiology clinic in 2016 for exertional chest pain but CT angio coronary arteries in [place], [place] on 03/07/2007 demonstrated normal coronaries. She has bilateral varicose veins. She had scarlet fever as a child. She is here for follow-up visit. She has been having symptoms of DOE, two-pillow orthopnea and PND associated with abdominal distention and lower EXTR swelling x 2 weeks.  She also has burning micturition with hematuria for which she saw her PCP a few days ago and was prescribed Zofran for nausea.  Urine cultures were sent, pending results.  Denied angina, syncope.  Patient was feeling extremely nauseous throughout my interview.  Past Medical History:  Diagnosis Date   (HFpEF) heart failure with preserved ejection fraction (HCC)    A-fib (HCC)    Dx: 01/2022   Breast cancer (HCC) 02/24/2000   Bronchitis    Cancer (HCC)    CHF (congestive heart failure) (HCC)    Chronic back pain    Depression    Dysrhythmia    GERD (gastroesophageal reflux disease)    Hematuria    Hyperlipidemia    Hypertension    Insomnia    Kidney stone    Personal history of chemotherapy    Personal history of radiation therapy    Pleural effusion    Pneumonia    Thyroid goiter    Vertigo     Past Surgical History:  Procedure Laterality Date   ABDOMINAL HYSTERECTOMY     BREAST LUMPECTOMY Left 2002   BREAST SURGERY     left   CYSTOSCOPY/RETROGRADE/URETEROSCOPY/STONE EXTRACTION WITH BASKET  01/07/2011    Procedure: CYSTOSCOPY/RETROGRADE/URETEROSCOPY/STONE EXTRACTION WITH BASKET;  Surgeon: Ky Barban;  Location: [facility];  Service: Urology;  Laterality: Left;  Balloon Dilatation; stone given to family per MD   RIGHT/LEFT HEART CATH AND CORONARY ANGIOGRAPHY N/A 07/15/2022   Procedure: RIGHT/LEFT HEART CATH AND CORONARY ANGIOGRAPHY;  Surgeon: Dolores Patty, MD;  Location: [facility] INVASIVE CV LAB;  Service: Cardiovascular;  Laterality: N/A;   TEE WITHOUT CARDIOVERSION N/A 07/16/2022   Procedure: TRANSESOPHAGEAL ECHOCARDIOGRAM;  Surgeon: Dolores Patty, MD;  Location: [facility] INVASIVE CV LAB;  Service: Cardiovascular;  Laterality: N/A;   TUBAL LIGATION      Current Outpatient Medications  Medication Sig Dispense Refill   acetaminophen (TYLENOL) 500 MG tablet Take 1,000 mg by mouth every 6 (six) hours as needed for mild pain or moderate pain.      acyclovir (ZOVIRAX) 400 MG tablet TAKE 1 TABLET BY MOUTH FIVE TIMES DAILY (Patient taking differently: Take 400 mg by mouth daily.) 35 tablet 0   alendronate (FOSAMAX) 70 MG tablet TAKE 1 TABLET BY MOUTH EVERY 7  DAYS ON AN EMPTY STOMACH WITH A  FULL GLASS OF WATER 12 tablet 3   atorvastatin (LIPITOR) 20 MG tablet TAKE 1 TABLET BY MOUTH IN THE  EVENING 90 tablet 3   Calcium Carb-Cholecalciferol (CALTRATE 600+D3) 600-20 MG-MCG TABS Take 1 tablet by mouth daily. 60 tablet 11   Elastic Bandages & Supports  MISC 1 Package by Does not apply route as directed. 1 Package 0   ELIQUIS 5 MG TABS tablet TAKE 1 TABLET BY MOUTH TWICE  DAILY 180 tablet 3   FLUoxetine (PROZAC) 10 MG capsule TAKE 1 CAPSULE BY MOUTH DAILY 90 capsule 3   furosemide (LASIX) 40 MG tablet Take 80 mg by mouth 2 (two) times daily.     meclizine (ANTIVERT) 25 MG tablet TAKE 1 TABLET BY MOUTH THREE TIMES DAILY AS NEEDED FOR DIZZINESS (Patient taking differently: Take 25 mg by mouth 3 (three) times daily as needed for dizziness.) 30 tablet 0   Misc. Devices (TOILET SAFETY FRAME) MISC 1 each by  Does not apply route daily as needed. 1 each 0   potassium chloride (KLOR-CON M) 10 MEQ tablet Take 1 tablet (10 mEq total) by mouth every other day. 15 tablet 3   spironolactone (ALDACTONE) 25 MG tablet Take 0.5 tablets (12.5 mg total) by mouth daily. 45 tablet 3   temazepam (RESTORIL) 30 MG capsule TAKE 1 CAPSULE BY MOUTH EVERY  NIGHT AT BEDTIME 90 capsule 3   vitamin C (ASCORBIC ACID) 500 MG tablet Take 500 mg by mouth daily.     Vitamin D, Ergocalciferol, (DRISDOL) 1.25 MG (50000 UNIT) CAPS capsule TAKE 1 CAPSULE BY MOUTH ONCE  WEEKLY 13 capsule 3   No current facility-administered medications for this visit.   Allergies:  Meclizine and Penicillins   Social History: The patient  reports that she quit smoking about 42 years ago. Her smoking use included cigarettes. She started smoking about 47 years ago. She has a 5 pack-year smoking history. She has never used smokeless tobacco. She reports that she does not drink alcohol and does not use drugs.   Family History: The patient's family history includes Aneurysm in her brother; Bone cancer in her brother; COPD in her father, sister, and sister; Diabetes in her brother and sister; HIV/AIDS in her brother; Heart disease in her brother; Seizures in her mother.   ROS:  Please see the history of present illness. Otherwise, complete review of systems is positive for none  All other systems are reviewed and negative.   Physical Exam: VS:  BP 108/68 (BP Location: Right Arm, Patient Position: Sitting, Cuff Size: Normal)   Pulse 76   Ht 5\' 7"  (1.702 m)   Wt 187 lb 9.6 oz (85.1 kg)   SpO2 98%   BMI 29.38 kg/m , BMI Body mass index is 29.38 kg/m.  Wt Readings from Last 3 Encounters:  10/05/22 187 lb 9.6 oz (85.1 kg)  08/25/22 191 lb 12.8 oz (87 kg)  08/06/22 192 lb 1.9 oz (87.1 kg)    General: Patient appears comfortable at rest. HEENT: Conjunctiva and lids normal, oropharynx clear with moist mucosa. Neck: Supple, no elevated JVP or carotid  bruits, no thyromegaly. Lungs: Clear to auscultation, nonlabored breathing at rest. Cardiac: Regular rate and rhythm, no S3 or significant systolic murmur, no pericardial rub. Abdomen: Soft, nontender, no hepatomegaly, bowel sounds present, no guarding or rebound. Extremities: No pitting edema, distal pulses 2+. Skin: Warm and dry. Musculoskeletal: No kyphosis. Neuropsychiatric: Alert and oriented x3, affect grossly appropriate.  Recent Labwork: 07/14/2022: ALT 11; AST 21; TSH 1.491 08/06/2022: Magnesium 2.0 08/25/2022: B Natriuretic Peptide 534.2; BUN 10; Creatinine, Ser 1.01; Hemoglobin 12.9; Platelets 236; Potassium 3.8; Sodium 139     Component Value Date/Time   CHOL 85 07/03/2022 0421   CHOL 149 07/04/2021 1341   TRIG 61 07/03/2022 0421   HDL  35 (L) 07/03/2022 0421   HDL 53 07/04/2021 1341   CHOLHDL 2.4 07/03/2022 0421   VLDL 12 07/03/2022 0421   LDLCALC 38 07/03/2022 0421   LDLCALC 78 07/04/2021 1341   LDLCALC 79 04/25/2019 1053    Assessment and Plan: Patient is a 82 y/o F known to have HTN, HLD, A-fib, severe atrial functional TR with moderate RV enlargement and mild PASP is here for follow-up visit.  # Acute on chronic HFpEF # Right heart failure with severe atrial functional TR and moderate RV enlargement with mild PASP -She has been having symptoms of DOE, two-pillow orthopnea and PND associated with abdominal distention and lower EXTR swelling x 2 weeks. She will need to be admitted to the hospital for management of acute on chronic HFpEF exacerbation with IV diuresis.  Can start with IV Lasix 60 mg BID. I spoke with Dr. Hyacinth Meeker, ER attending at [facility] who is aware of the patient's arrival. Cardiology team will follow the patient tomorrow. Hold home dose of torsemide. -Outpatient referral to structural heart clinic for therapeutic interventions of severe atrial functional TR due to multiple prior hospital admissions for heart failure exacerbation.  # Persistent  A-fib -Patient has recent onset of dizziness and has low ventricular response. She will benefit from 2-week event monitor to rule out any conduction system abnormalities. -Continue metoprolol tartrate 12.5 mg twice daily and Eliquis 5 mg twice daily.6  # UTI -Patient symptoms of UTI, burning micturition with tingling and hematuria. She also has nausea for which she saw her PCP recently and was prescribed Zofran. She has been nauseous throughout my interview. Urine culture results were pending.   I have spent a total of 50 minutes with patient reviewing chart, EKGs, labs and examining patient as well as establishing an assessment and plan that was discussed with the patient.  > 50% of time was spent in direct patient care.    Medication Adjustments/Labs and Tests Ordered: Current medicines are reviewed at length with the patient today.  Concerns regarding medicines are outlined above.   Tests Ordered: No orders of the defined types were placed in this encounter.   Medication Changes: No orders of the defined types were placed in this encounter.   Disposition:  Follow up  1 month with Sharlene Dory and 3 months with me  Signed  Verne Spurr, MD, 10/05/2022 3:37 PM    [facility] at [facility] [address] [place], [place], [place] [address]

## 2022-10-06 ENCOUNTER — Encounter: Payer: Self-pay | Admitting: Internal Medicine

## 2022-10-12 ENCOUNTER — Other Ambulatory Visit: Payer: Self-pay | Admitting: Family Medicine

## 2022-10-19 ENCOUNTER — Telehealth: Payer: Self-pay

## 2022-10-19 NOTE — Transitions of Care (Post Inpatient/ED Visit) (Signed)
   10/19/2022  Name: NYOBI HERRIAGE MRN: [id number] DOB: September 27, 1940  Today's TOC FU Call Status: Today's TOC FU Call Status:: Successful TOC FU Call Completed TOC FU Call Complete Date: 10/19/22 Patient's Name and Date of Birth confirmed.  Transition Care Management Follow-up Telephone Call Date of Discharge: 10/16/22 Discharge Facility: Other ([facility]) Name of Other ([facility]) Discharge Facility: [facility] Type of Discharge: Inpatient Admission Primary Inpatient Discharge Diagnosis:: pre op How have you been since you were released from the hospital?: Better Any questions or concerns?: No  Items Reviewed: Did you receive and understand the discharge instructions provided?: Yes Medications obtained,verified, and reconciled?: Yes (Medications Reviewed) Any new allergies since your discharge?: No Dietary orders reviewed?: Yes Do you have support at home?: Yes People in Home: sibling(s)  Medications Reviewed Today: Medications Reviewed Today   Medications were not reviewed in this encounter     Home Care and Equipment/Supplies: Were Home Health Services Ordered?: NA Any new equipment or medical supplies ordered?: NA  Functional Questionnaire: Do you need assistance with bathing/showering or dressing?: Yes Do you need assistance with meal preparation?: Yes Do you need assistance with eating?: No Do you need assistance with getting out of bed/getting out of a chair/moving?: No Do you have difficulty managing or taking your medications?: No  Follow up appointments reviewed: PCP Follow-up appointment confirmed?: NA Specialist Hospital Follow-up appointment confirmed?: No Follow-Up Specialty Provider:: surgeon Reason Specialist Follow-Up Not Confirmed: Patient has Specialist Provider Number and will Call for Appointment Do you need transportation to your follow-up appointment?: No Do you understand care options if your condition(s) worsen?: Yes-patient verbalized  understanding    SIGNATURE Karena Addison, LPN [facility] Nurse Health Advisor Direct Dial [phone]

## 2022-11-02 ENCOUNTER — Encounter (HOSPITAL_COMMUNITY): Payer: Self-pay

## 2022-11-02 ENCOUNTER — Inpatient Hospital Stay (HOSPITAL_COMMUNITY): Admission: RE | Admit: 2022-11-02 | Payer: Medicare Other | Source: Ambulatory Visit

## 2022-11-05 ENCOUNTER — Ambulatory Visit: Payer: Medicare Other | Admitting: Family Medicine

## 2022-11-26 ENCOUNTER — Ambulatory Visit: Payer: Medicare Other | Admitting: Family Medicine

## 2022-11-30 ENCOUNTER — Encounter: Payer: Self-pay | Admitting: Family Medicine

## 2022-12-06 ENCOUNTER — Other Ambulatory Visit: Payer: Self-pay | Admitting: Family Medicine

## 2022-12-08 ENCOUNTER — Ambulatory Visit: Payer: Medicare Other | Admitting: Family Medicine

## 2022-12-08 ENCOUNTER — Encounter: Payer: Self-pay | Admitting: Family Medicine

## 2022-12-08 VITALS — BP 121/83 | HR 70 | Ht 67.0 in | Wt 178.1 lb

## 2022-12-08 DIAGNOSIS — Z23 Encounter for immunization: Secondary | ICD-10-CM

## 2022-12-08 DIAGNOSIS — I071 Rheumatic tricuspid insufficiency: Secondary | ICD-10-CM | POA: Diagnosis not present

## 2022-12-08 DIAGNOSIS — R300 Dysuria: Secondary | ICD-10-CM

## 2022-12-08 NOTE — Patient Instructions (Signed)
F/U in 6 to 8 weeks, call if you need me sooner  Flu vaccine today  You appear to have a UTI, 5 days of antibiotic is prescribed , we will call next week when the culture report is final  Best with heart valve surgery on 12/17/2022 Thanks for choosing [facility], we consider it a privelige to serve you.

## 2022-12-09 LAB — URINALYSIS
Bilirubin, UA: NEGATIVE
Ketones, UA: NEGATIVE
Nitrite, UA: NEGATIVE
Protein,UA: NEGATIVE
Specific Gravity, UA: 1.02 (ref 1.005–1.030)
Urobilinogen, Ur: 0.2 mg/dL (ref 0.2–1.0)
pH, UA: 7 (ref 5.0–7.5)

## 2022-12-10 ENCOUNTER — Other Ambulatory Visit: Payer: Self-pay | Admitting: Family Medicine

## 2022-12-10 ENCOUNTER — Telehealth: Payer: Self-pay | Admitting: Family Medicine

## 2022-12-10 NOTE — Telephone Encounter (Signed)
Brother Theresa Robinsons called said nothing has been received by pharmacy medicine for her UTI  Ph[facility]: [facility]

## 2022-12-11 LAB — URINE CULTURE

## 2022-12-14 NOTE — Telephone Encounter (Signed)
Patient son called was sent to the wrong pharmacy. Needs to be sent to [facility] in [place] ASAP.

## 2022-12-15 ENCOUNTER — Other Ambulatory Visit: Payer: Self-pay | Admitting: Family Medicine

## 2022-12-15 MED ORDER — NITROFURANTOIN MONOHYD MACRO 100 MG PO CAPS: 100.0000 mg | ORAL_CAPSULE | Freq: Two times a day (BID) | ORAL | 0 refills | Status: DC

## 2022-12-15 NOTE — Telephone Encounter (Signed)
Message issent o prescriber on 10/22, I persomnally called [facility] pharmacy, called in an order for the macrobid which was initially prescribed on 12/08/2022. I m unable to speak with Arnettie, Mathilda or brother who called in, I tried all 3 numbers

## 2022-12-15 NOTE — Progress Notes (Signed)
I called [facility], they have no record of macrobid being prescribed, I gave a verbal order  for 5 day course and also re  sent a 5 day course I have tried to contact Amalea, Arnettie and her brother who called in and been unable to speak directly with any of them Pt has heart valve surgery scheduled for 10/24 at [facility]

## 2022-12-15 NOTE — Telephone Encounter (Signed)
Patient still waiting on a antibiotic to be called into [facility] for UTI.

## 2022-12-21 ENCOUNTER — Encounter: Payer: Self-pay | Admitting: Family Medicine

## 2022-12-21 DIAGNOSIS — Z23 Encounter for immunization: Secondary | ICD-10-CM | POA: Insufficient documentation

## 2022-12-21 NOTE — Assessment & Plan Note (Signed)
After obtaining informed consent, the influnza vaccine is  administered , with no adverse effect noted at the time of administration.

## 2022-12-21 NOTE — Progress Notes (Signed)
Theresa Garcia     MRN: [id number]      DOB: 1940-05-23  Chief Complaint  Patient presents with   Urinary Tract Infection    Possible UTI    HPI Theresa Garcia is here with complaunt as above Denies fever , flanlk pain, chills or nause , has past h/o UTI's and is mildly symptomatic   ROS Denies recent fever or chills. Denies sinus pressure, nasal congestion, ear pain or sore throat. Denies chest congestion, productive cough or wheezing. Denies abdominal pain, nausea, vomiting,diarrhea or constipation.   Denies  uncontrolled joint pain, swelling and limitation in mobility. Denies headaches, seizures, numbness, or tingling. Denies  uncontrolled depression, anxiety or insomnia. Denies skin break down or rash.   PE  BP 121/83 (BP Location: Right Arm, Patient Position: Sitting, Cuff Size: Normal)   Pulse 70   Ht 5\' 7"  (1.702 m)   Wt 178 lb 1.9 oz (80.8 kg)   SpO2 93%   BMI 27.90 kg/m   Patient alert and oriented and in no cardiopulmonary distress.  HEENT: No facial asymmetry, EOMI,     Neck supple .  Chest: Clear to auscultation bilaterally.  CVS: S1, S2  murmur, no S3.Regular rate.  ABD: Soft non tender. No renal angle or supra pubic tenderness  Ext: No edema  MS: Adequate ROM spine, shoulders, hips and knees.  Skin: Intact, no ulcerations or rash noted.  Psych: Good eye contact, normal affect. Memory intact not anxious or depressed appearing.  CNS: CN 2-12 intact, power,  normal throughout.no focal deficits noted.   Assessment & Plan  Dysuria Symptomatic x 5 3 days  with h/o  UTI CCUA is abnormal, with treat presumptively and f/u c/s  Encounter for immunization After obtaining informed consent, the influnza vaccine is  administered , with no adverse effect noted at the time of administration.   Severe tricuspid regurgitation Valve replacement scheduled at u for 12/17/2022

## 2022-12-21 NOTE — Assessment & Plan Note (Signed)
Symptomatic x 5 3 days  with h/o  UTI CCUA is abnormal, with treat presumptively and f/u c/s

## 2022-12-21 NOTE — Assessment & Plan Note (Signed)
Valve replacement scheduled at u for 12/17/2022

## 2022-12-22 ENCOUNTER — Telehealth: Payer: Self-pay

## 2022-12-22 NOTE — Transitions of Care (Post Inpatient/ED Visit) (Signed)
12/22/2022  Name: SERRIA SECOY MRN: [id number] DOB: December 03, 1940  Today's TOC FU Call Status: Today's TOC FU Call Status:: Unsuccessful Call (1st Attempt) Unsuccessful Call (1st Attempt) Date: 12/22/22  Attempted to reach the patient regarding the most recent Inpatient/ED visit.  Follow Up Plan: Additional outreach attempts will be made to reach the patient to complete the Transitions of Care (Post Inpatient/ED visit) call.   Lonia Chimera, RN, BSN, CEN [employer]- Transition of Care Team.  [facility] [phone]

## 2022-12-23 ENCOUNTER — Telehealth: Payer: Self-pay

## 2022-12-23 NOTE — Transitions of Care (Post Inpatient/ED Visit) (Signed)
12/23/2022  Name: Theresa Garcia MRN: [id number] DOB: [birth year]  Today's TOC FU Call Status: Today's TOC FU Call Status:: Unsuccessful Call (2nd Attempt) Unsuccessful Call (2nd Attempt) Date: 12/23/22  Attempted to reach the patient regarding the most recent Inpatient/ED visit.  Follow Up Plan: Additional outreach attempts will be made to reach the patient to complete the Transitions of Care (Post Inpatient/ED visit) call.   Lonia Chimera, RN, BSN, CEN [employer]- Transition of Care Team.  [facility] [phone]

## 2022-12-24 ENCOUNTER — Telehealth: Payer: Self-pay

## 2022-12-24 ENCOUNTER — Other Ambulatory Visit (HOSPITAL_COMMUNITY): Payer: Self-pay

## 2022-12-24 DIAGNOSIS — Z954 Presence of other heart-valve replacement: Secondary | ICD-10-CM

## 2022-12-24 NOTE — Transitions of Care (Post Inpatient/ED Visit) (Signed)
12/24/2022  Name: Theresa Garcia MRN: [id number] DOB: August 22, 1940  Today's TOC FU Call Status: Today's TOC FU Call Status:: Successful TOC FU Call Completed TOC FU Call Complete Date: 12/24/22 Patient's Name and Date of Birth confirmed.  Transition Care Management Follow-up Telephone Call Type of Discharge: Inpatient Admission How have you been since you were released from the hospital?: Better (able to sit up today in the chair.  PT started today) Any questions or concerns?: No  Items Reviewed: Did you receive and understand the discharge instructions provided?: Yes Medications obtained,verified, and reconciled?: Yes (Medications Reviewed) Any new allergies since your discharge?: No Dietary orders reviewed?: Yes Type of Diet Ordered:: low salt, heart healthy Do you have support at home?: Yes People in Home: sibling(s) Name of Support/Comfort Primary Source: brothers and sister  Medications Reviewed Today: Medications Reviewed Today     Reviewed by Earlie Server, RN (Registered Nurse) on 12/24/22 at 1519  Med List Status: <None>   Medication Order Taking? Sig Documenting Provider Last Dose Status Informant  acetaminophen (TYLENOL) 500 MG tablet [id number] Yes Take 1,000 mg by mouth every 6 (six) hours as needed for mild pain or moderate pain.  [provider] Taking Active Family Member  acyclovir (ZOVIRAX) 400 MG tablet [id number] Yes Take 1 tablet (400 mg total) by mouth daily. Kerri Perches, MD Taking Active   alendronate (FOSAMAX) 70 MG tablet [id number] Yes TAKE 1 TABLET BY MOUTH EVERY 7 days on an empty stomach with a full glass of water. Kerri Perches, MD Taking Active   atorvastatin (LIPITOR) 20 MG tablet [id number] Yes TAKE 1 TABLET BY MOUTH IN THE  Yetta Glassman, MD Taking Active Family Member  Calcium Carb-Cholecalciferol (CALTRATE 600+D3) 600-20 MG-MCG TABS [id number] Yes Take 1 tablet by mouth daily. Kerri Perches, MD Taking  Active [facility] Member  Elastic Bandages & Supports MISC [id number] Yes 1 Package by Does not apply route as directed. Sharlene Dory, NP Taking Active Family Member  ELIQUIS 5 MG TABS tablet [id number] Yes TAKE 1 TABLET BY MOUTH TWICE  DAILY Kerri Perches, MD Taking Active   empagliflozin (JARDIANCE) 10 MG TABS tablet [id number] Yes Take 10 mg by mouth daily. [provider] Taking Active   FLUoxetine (PROZAC) 10 MG capsule [id number] Yes TAKE 1 CAPSULE BY MOUTH DAILY Kerri Perches, MD Taking Active   furosemide (LASIX) 40 MG tablet [id number] Yes take 2 tablets by mouth twice daily  Patient taking differently: Take 80 mg by mouth daily.   Kerri Perches, MD Taking Active   meclizine (ANTIVERT) 25 MG tablet [id number] Yes TAKE 1 TABLET BY MOUTH THREE TIMES DAILY AS NEEDED FOR DIZZINESS  Patient taking differently: Take 25 mg by mouth 3 (three) times daily as needed for dizziness.   Kerri Perches, MD Taking Active Family Member  Misc. Devices (TOILET SAFETY [place]) [place] [id number] Yes 1 each by Does not apply route daily as needed. Eustace Moore, MD Taking Active Family Member  nitrofurantoin, macrocrystal-monohydrate, (MACROBID) 100 MG capsule [id number] No Take 1 capsule (100 mg total) by mouth 2 (two) times daily.  Patient not taking: Reported on 12/24/2022   Kerri Perches, MD Not Taking Active   nitrofurantoin, macrocrystal-monohydrate, (MACROBID) 100 MG capsule [id number] No Take 1 capsule (100 mg total) by mouth 2 (two) times daily.  Patient not taking: Reported on 12/24/2022   Kerri Perches, MD Not Taking Active   potassium chloride (KLOR-CON M) 10 MEQ tablet  [id number] No Take 1 tablet (10 mEq total) by mouth every other day.  Patient not taking: Reported on 12/24/2022   Kerri Perches, MD Not Taking Active   spironolactone (ALDACTONE) 25 MG tablet [id number] Yes Take 0.5 tablets (12.5 mg total) by mouth daily. Alen Bleacher, NP Taking Active             Med Note (ROSE, Shelle Iron Dec 24, 2022  3:18 PM) Per brother patient is taking 25 mg every day  temazepam (RESTORIL) 30 MG capsule [id number] Yes TAKE ONE CAPSULE AT BEDTIME Kerri Perches, MD Taking Active   vitamin C (ASCORBIC ACID) 500 MG tablet [id number] Yes Take 500 mg by mouth daily. [provider] Taking Active Family Member  Vitamin D, Ergocalciferol, (DRISDOL) 1.25 MG (50000 UNIT) CAPS capsule [id number] Yes TAKE 1 CAPSULE BY MOUTH ONCE  WEEKLY Kerri Perches, MD Taking Active Family Member            Home Care and Equipment/Supplies: Were Home Health Services Ordered?: Yes Name of Home Health Agency:: [facility] Has Agency set up a time to come to your home?: Yes First Home Health Visit Date: 12/24/22 Any new equipment or medical supplies ordered?: No  Functional Questionnaire: Do you need assistance with bathing/showering or dressing?: Yes (sister) Do you need assistance with meal preparation?: Yes (brother and sister preparing meals) Do you need assistance with eating?: No Do you have difficulty maintaining continence: No Do you need assistance with getting out of bed/getting out of a chair/moving?: No Do you have difficulty managing or taking your medications?: Yes (brother and sister managing medications)  Follow up appointments reviewed: PCP Follow-up appointment confirmed?: Yes Date of PCP follow-up appointment?: 12/25/22 Follow-up Provider: PCP Specialist Hospital Follow-up appointment confirmed?: Yes Date of Specialist follow-up appointment?: 01/27/23 Follow-Up Specialty Provider:: cardiology Do you need transportation to your follow-up appointment?: No Do you understand care options if your condition(s) worsen?: Yes-patient verbalized understanding  SDOH Interventions Today    Flowsheet Row Most Recent Value  SDOH Interventions   Food Insecurity Interventions Intervention Not Indicated  Housing Interventions Intervention Not  Indicated  Transportation Interventions Intervention Not Indicated      Reviewed discharge instructions and medications Brother and sister are caregivers.  Reviewed cardiac rehab and brother has decided to wait until after PT is completed. Offered 30 day TOC program and brother has declined. Provided my contact information if any question arise in the near future.  Lonia Chimera, RN, BSN, CEN [employer]- Transition of Care Team.  [facility] [phone]

## 2022-12-25 ENCOUNTER — Encounter: Payer: Self-pay | Admitting: Family Medicine

## 2022-12-25 ENCOUNTER — Ambulatory Visit (INDEPENDENT_AMBULATORY_CARE_PROVIDER_SITE_OTHER): Payer: Medicare Other | Admitting: Family Medicine

## 2022-12-25 VITALS — BP 116/79 | HR 86 | Ht 67.0 in | Wt 178.1 lb

## 2022-12-25 DIAGNOSIS — R300 Dysuria: Secondary | ICD-10-CM | POA: Diagnosis not present

## 2022-12-25 DIAGNOSIS — I509 Heart failure, unspecified: Secondary | ICD-10-CM | POA: Diagnosis not present

## 2022-12-25 DIAGNOSIS — F5104 Psychophysiologic insomnia: Secondary | ICD-10-CM

## 2022-12-25 DIAGNOSIS — Z952 Presence of prosthetic heart valve: Secondary | ICD-10-CM | POA: Diagnosis not present

## 2022-12-25 DIAGNOSIS — Z7689 Persons encountering health services in other specified circumstances: Secondary | ICD-10-CM | POA: Diagnosis not present

## 2022-12-25 DIAGNOSIS — A6004 Herpesviral vulvovaginitis: Secondary | ICD-10-CM

## 2022-12-25 MED ORDER — SPIRONOLACTONE 25 MG PO TABS: 25.0000 mg | ORAL_TABLET | Freq: Every day | ORAL | 5 refills | Status: DC

## 2022-12-25 MED ORDER — TEMAZEPAM 15 MG PO CAPS: 15.0000 mg | ORAL_CAPSULE | Freq: Every evening | ORAL | 2 refills | Status: DC | PRN

## 2022-12-25 MED ORDER — ACYCLOVIR 400 MG PO TABS: 400.0000 mg | ORAL_TABLET | Freq: Every day | ORAL | 5 refills | Status: DC

## 2022-12-25 NOTE — Patient Instructions (Addendum)
F/U in 10 weeks, call/ message  if you need me sooner, and with any questions or concerns  New is temazepam at lower dose of 15 mg at night IF NEEDED  After you complete the antibiotic course IF you still have tingling you may take acyclovir tablets once daily as prescribed, you have taken them like this for years  Dose of spironolactone has been changed as reported to 25 mg ONE  daily  You are Referred for h/h with once weekly nursing , lab draw next week, CBC, chem 7 and EGFR and magnesium to be drawn 12/30/2022  Thankful that surgery has gone well and that recovery is taking place  Medications are at [facility] pharmacy  Thanks for choosing [facility], we consider it a privelige to serve you.

## 2022-12-28 ENCOUNTER — Other Ambulatory Visit: Payer: Self-pay

## 2022-12-28 DIAGNOSIS — Z952 Presence of prosthetic heart valve: Secondary | ICD-10-CM | POA: Insufficient documentation

## 2022-12-28 DIAGNOSIS — I509 Heart failure, unspecified: Secondary | ICD-10-CM

## 2022-12-28 NOTE — Assessment & Plan Note (Signed)
Patient in for follow up of recent hospitalization for replacement of tricuspid valve done at [facility].  Discharge summary, and laboratory and radiology data are reviewed, and any questions or concerns  are discussed. Specific issues requiring follow up are specifically addressed. Needs h/h to follow due to  complex health conditions with increased risk of hospitalization Needs rept labs next week '

## 2022-12-28 NOTE — Assessment & Plan Note (Signed)
Currently on septra for established UTI

## 2022-12-28 NOTE — Assessment & Plan Note (Signed)
Reduced dose restoril only if needed, as excessively sleepy at this time

## 2022-12-28 NOTE — Assessment & Plan Note (Signed)
Acyclovir daily if needed, to be resumed

## 2022-12-28 NOTE — Assessment & Plan Note (Signed)
Generalized weakness and fatigue, needs updated CBC, chem 7 and EGr and magnesium drawn 11/06 , refer H/H nursing , request labs be drawn at home

## 2022-12-28 NOTE — Progress Notes (Signed)
   Theresa Garcia     MRN: [id number]      DOB: 1940-08-23  Chief Complaint  Patient presents with   Hospitalization Follow-up    Toc d/c 12/18/22 patient reports feeling tired     HPI Theresa Garcia is here for [facility] visit following hospitalization at [facility]  for replacement of tricuspid valve on 12/17/2022, has been back in [place] x 4 days and though tired , overall recovery taking place with no major hiccups thus far. Has PT/OT at home.  Appetite reported as good, no skin breakdown reported , and incision sites have no redness or drainage No fever, chills or productive cough reported Still c/o vaginal "tingling" now taking the antibiotic course which was prescribed several weeks ago for uTI, has positive HSV2 , and had been maintained on daily acyclovir in the past which she will  need to resume if symptomatic  Excessively tired and sleeping a lot, still requesting temazepam which she has been on for years, will lower dose and to be used only if needed Will request h/h nurse  weekly x 5 6 weeks, dx of heart failure s/p valve replacement  Medications and hospital course are reviewed with patient and her 2 siblings who accompany her  Spironolactone dose pt is receiving is changed to what is reported by her brother who is administering the medication and is a reliable historian  ROS See above Denies recent fever or chills. Denies sinus pressure, nasal congestion, ear pain or sore throat. Denies chest congestion, productive cough or wheezing. Denies chest pains, palpitations and leg swelling Denies abdominal pain, nausea, vomiting,diarrhea or constipation.   PE  BP 116/79 (BP Location: Right Arm, Patient Position: Sitting, Cuff Size: Large)   Pulse 86   Ht 5\' 7"  (1.702 m)   Wt 178 lb 1.9 oz (80.8 kg)   SpO2 94%   BMI 27.90 kg/m   Patient drowsy, but arousable and in no cardiopulmonary distress.  HEENT: No facial asymmetry, EOMI,     Neck supple .  Chest: Clear to auscultation  bilaterally.  CVS: S1, S2 .Regular rate.  ABD: Soft non tender.   Ext: No edema  MS: Adequate though reduced  ROM spine, shoulders, hips and knees.  Skin: Intact, incision sites in groin clean , dry, no erythema or drainage.  Psych: Good eye contact, normal affect.  not anxious or depressed appearing.  CNS: CN 2-12 intact, power,  normal throughout..   Assessment & Plan  Encounter for support and coordination of transition of care Patient in for follow up of recent hospitalization for replacement of tricuspid valve done at [facility].  Discharge summary, and laboratory and radiology data are reviewed, and any questions or concerns  are discussed. Specific issues requiring follow up are specifically addressed. Needs h/h to follow due to  complex health conditions with increased risk of hospitalization Needs rept labs next week '  CHF (congestive heart failure) (HCC) H/h nurse to follow weekly for 6 weeks, s/p tricuspid valve replacement   Dysuria Currently on septra for established UTI  Insomnia Reduced dose restoril only if needed, as excessively sleepy at this time  Type 2 HSV infection of vulvovaginal region Acyclovir daily if needed, to be resumed  History of replacement of tricuspid valve Generalized weakness and fatigue, needs updated CBC, chem 7 and EGr and magnesium drawn 11/06 , refer H/H nursing , request labs be drawn at home

## 2022-12-28 NOTE — Assessment & Plan Note (Signed)
H/h nurse to follow weekly for 6 weeks, s/p tricuspid valve replacement

## 2022-12-29 ENCOUNTER — Ambulatory Visit: Payer: Medicare Other | Admitting: Internal Medicine

## 2022-12-30 ENCOUNTER — Telehealth: Payer: Self-pay | Admitting: Family Medicine

## 2022-12-30 ENCOUNTER — Inpatient Hospital Stay: Payer: Medicare Other | Admitting: Family Medicine

## 2022-12-30 NOTE — Telephone Encounter (Signed)
[facility] called left a voicemail needs verbal orders for occupational therapy for 1 x week for 6 weeks and 2 and 2 for exerciese and functional to toilet and shower. Call back # [phone] for verbal orders.

## 2022-12-30 NOTE — Telephone Encounter (Signed)
Verbal order given  

## 2022-12-31 ENCOUNTER — Other Ambulatory Visit: Payer: Self-pay | Admitting: Family Medicine

## 2023-01-14 ENCOUNTER — Encounter: Payer: Self-pay | Admitting: Internal Medicine

## 2023-01-14 ENCOUNTER — Encounter: Payer: Self-pay | Admitting: Family Medicine

## 2023-01-14 ENCOUNTER — Telehealth: Payer: Self-pay | Admitting: Family Medicine

## 2023-01-14 ENCOUNTER — Ambulatory Visit: Payer: Self-pay | Admitting: Family Medicine

## 2023-01-14 NOTE — Telephone Encounter (Signed)
No vm-kg

## 2023-01-14 NOTE — Telephone Encounter (Signed)
  Chief Complaint: PCP information Additional Notes: Home Health RN calling for verbal consent to see patient for evaluation. Caller for warm transferred to the clinical for referral request. Caller verbalizes understanding.   Copied from CRM [id number]. Topic: Clinical - Red Word Triage >> Jan 14, 2023  2:25 PM Alona Bene A wrote: Red Word that prompted transfer to Nurse Triage: Shortness Of Breath Reason for Disposition  [1] Other NON-URGENT information for PCP AND [2] does not require PCP response  Answer Assessment - Initial Assessment Questions 1. REASON FOR CALL or QUESTION: "What is your reason for calling today?" or "How can I best help you?" or "What question do you have that I can help answer?"     Home Health Nurse calling for verbal consent to go and see patient for evaluation. This RN warm transferred call to the clinic for referral. 2. CALLER: Document the source of call. (e.g., laboratory, patient).     Home Health RN  Protocols used: PCP Call - No Triage-A-AH

## 2023-01-14 NOTE — Telephone Encounter (Signed)
[facility] calling needing nursing orders. Call back # [phone] Thanks

## 2023-01-15 ENCOUNTER — Other Ambulatory Visit: Payer: Self-pay

## 2023-01-15 DIAGNOSIS — E66811 Obesity, class 1: Secondary | ICD-10-CM

## 2023-01-15 DIAGNOSIS — I10 Essential (primary) hypertension: Secondary | ICD-10-CM

## 2023-01-26 ENCOUNTER — Ambulatory Visit: Payer: Medicare Other | Admitting: Family Medicine

## 2023-01-28 ENCOUNTER — Encounter: Payer: Self-pay | Admitting: Internal Medicine

## 2023-01-28 ENCOUNTER — Telehealth (HOSPITAL_COMMUNITY): Payer: Self-pay

## 2023-01-28 NOTE — Telephone Encounter (Signed)
Called patient's brother and primary caregiver, Kandice Robinsons, regarding cardiac rehab referral for TV replacement. Patient saw Dr. Harland German at [facility] yesterday for follow up. He said he was not sure about continuing PT at home or doing cardiac rehab. He forgot to talk about CR with Dr. Harland German. He has sent a message through [place] and he will contact us if they decide to pursue cardiac rehab.

## 2023-02-02 ENCOUNTER — Other Ambulatory Visit: Payer: Self-pay | Admitting: Family Medicine

## 2023-02-03 ENCOUNTER — Other Ambulatory Visit: Payer: Self-pay | Admitting: Family Medicine

## 2023-02-13 ENCOUNTER — Other Ambulatory Visit: Payer: Self-pay | Admitting: Family Medicine

## 2023-03-04 ENCOUNTER — Other Ambulatory Visit: Payer: Self-pay | Admitting: Family Medicine

## 2023-03-05 ENCOUNTER — Ambulatory Visit: Payer: Medicare Other | Admitting: Family Medicine

## 2023-03-08 ENCOUNTER — Other Ambulatory Visit: Payer: Self-pay | Admitting: Family Medicine

## 2023-04-01 ENCOUNTER — Ambulatory Visit: Payer: Medicare Other | Attending: Internal Medicine | Admitting: Internal Medicine

## 2023-04-01 NOTE — Progress Notes (Signed)
 Erroneous encounter - please disregard.

## 2023-04-06 ENCOUNTER — Other Ambulatory Visit: Payer: Self-pay | Admitting: Family Medicine

## 2023-04-07 ENCOUNTER — Other Ambulatory Visit: Payer: Self-pay | Admitting: Family Medicine

## 2023-04-13 ENCOUNTER — Ambulatory Visit: Payer: Medicare Other | Admitting: Family Medicine

## 2023-04-16 ENCOUNTER — Ambulatory Visit: Payer: Self-pay | Admitting: Family Medicine

## 2023-04-16 ENCOUNTER — Telehealth: Payer: Self-pay | Admitting: Family Medicine

## 2023-04-16 NOTE — Telephone Encounter (Signed)
Copied from CRM [id number]. Topic: Clinical - Medication Refill >> Apr 16, 2023  2:34 PM Ivette P wrote: Most Recent Primary Care Visit:  Provider: Kerri Perches  Department: [facility]  Visit Type: HOSPITAL FU  Date: 12/25/2022  Medication: Pt has a UTI.   Has the patient contacted their pharmacy? Yes (Agent: If no, request that the patient contact the pharmacy for the refill. If patient does not wish to contact the pharmacy document the reason why and proceed with request.) (Agent: If yes, when and what did the pharmacy advise?)  Is this the correct pharmacy for this prescription? Yes If no, delete pharmacy and type the correct one.  This is the patient's preferred pharmacy:  [facility] - [place], [place] - [address] [place] [address] Phone: [phone] Fax: [phone]   Has the prescription been filled recently? No  Is the patient out of the medication? Yes  Has the patient been seen for an appointment in the last year OR does the patient have an upcoming appointment? Yes  Can we respond through MyChart? Yes  Agent: Please be advised that Rx refills may take up to 3 business days. We ask that you follow-up with your pharmacy.

## 2023-04-16 NOTE — Telephone Encounter (Addendum)
This RN attempted third and final attempt to patient. No answer. LVM to call practice back.   This RN attempted return call attempt #2. No answer. LVM.  This RN attempted return call. No answer. LVM.  Copied from CRM [id number]. Topic: Clinical - Medication Refill >> Apr 16, 2023  2:34 PM Ivette P wrote: Most Recent Primary Care Visit:  Provider: Kerri Perches  Department: [facility]  Visit Type: HOSPITAL FU  Date: 12/25/2022  Medication: Pt has a UTI.   Has the patient contacted their pharmacy? Yes (Agent: If no, request that the patient contact the pharmacy for the refill. If patient does not wish to contact the pharmacy document the reason why and proceed with request.) (Agent: If yes, when and what did the pharmacy advise?)  Is this the correct pharmacy for this prescription? Yes If no, delete pharmacy and type the correct one.  This is the patient's preferred pharmacy:  [facility] - [place], [place] - [address] [place] [address] Phone: [phone] Fax: [phone]   Has the prescription been filled recently? No  Is the patient out of the medication? Yes  Has the patient been seen for an appointment in the last year OR does the patient have an upcoming appointment? Yes  Can we respond through MyChart? Yes  Agent: Please be advised that Rx refills may take up to 3 business days. We ask that you follow-up with your pharmacy. >> Apr 16, 2023  2:37 PM Ivette P wrote: Pt wanted to include she has been busy because [employer] on May 10, 2023 and they had a burial on 04/13/2023. Pt wanted to leave this note for Dr. Lodema Hong

## 2023-04-19 ENCOUNTER — Other Ambulatory Visit: Payer: Self-pay | Admitting: Family Medicine

## 2023-04-21 NOTE — Telephone Encounter (Signed)
 Spoke with sister tioday, pt was seen  in urgent care

## 2023-04-30 ENCOUNTER — Other Ambulatory Visit: Payer: Self-pay | Admitting: Family Medicine

## 2023-05-04 ENCOUNTER — Other Ambulatory Visit: Payer: Self-pay | Admitting: Family Medicine

## 2023-05-18 ENCOUNTER — Encounter: Payer: Self-pay | Admitting: Family Medicine

## 2023-05-18 ENCOUNTER — Other Ambulatory Visit: Payer: Self-pay

## 2023-05-18 MED ORDER — EMPAGLIFLOZIN 10 MG PO TABS: 10.0000 mg | ORAL_TABLET | Freq: Every day | ORAL | 0 refills | Status: DC

## 2023-05-19 ENCOUNTER — Ambulatory Visit: Payer: Medicare Other | Admitting: Family Medicine

## 2023-05-19 VITALS — BP 110/69 | HR 73 | Ht 66.0 in | Wt 176.8 lb

## 2023-05-19 DIAGNOSIS — A6004 Herpesviral vulvovaginitis: Secondary | ICD-10-CM | POA: Diagnosis not present

## 2023-05-19 DIAGNOSIS — E559 Vitamin D deficiency, unspecified: Secondary | ICD-10-CM

## 2023-05-19 DIAGNOSIS — N760 Acute vaginitis: Secondary | ICD-10-CM | POA: Diagnosis not present

## 2023-05-19 DIAGNOSIS — I10 Essential (primary) hypertension: Secondary | ICD-10-CM

## 2023-05-19 DIAGNOSIS — Z09 Encounter for follow-up examination after completed treatment for conditions other than malignant neoplasm: Secondary | ICD-10-CM

## 2023-05-19 DIAGNOSIS — E782 Mixed hyperlipidemia: Secondary | ICD-10-CM | POA: Diagnosis not present

## 2023-05-19 NOTE — Patient Instructions (Addendum)
 Annual exam in 4 months, call if you need me sooner  Specimen to be sent for testing today  Due to peristence of symptoms, I an referring you to dr Donzetta Matters, Gynecology for  evaluation  Nurse pls get shingrix vaccine update from [facility] [place]  Fasting lipid, cmp and EGFr, Vit D, TSH , Vit D and HBa1c TO BE DRAWN IN NEXT 1 WEEK PLEASE  Thanks for choosing [facility], we consider it a privelige to serve you.

## 2023-05-22 LAB — NUSWAB VAGINITIS PLUS (VG+)
Candida albicans, NAA: NEGATIVE
Candida glabrata, NAA: NEGATIVE
Chlamydia trachomatis, NAA: NEGATIVE
Neisseria gonorrhoeae, NAA: NEGATIVE
Trich vag by NAA: NEGATIVE

## 2023-05-23 ENCOUNTER — Encounter: Payer: Self-pay | Admitting: Family Medicine

## 2023-05-25 MED ORDER — FLUOXETINE HCL 10 MG PO CAPS: 10.0000 mg | ORAL_CAPSULE | Freq: Every day | ORAL | 5 refills | Status: DC

## 2023-05-25 NOTE — Progress Notes (Unsigned)
   Theresa Garcia     MRN: [id number]      DOB: 04-14-1940  Chief Complaint  Patient presents with   ER follow up     ER follow up from 3/16. Burning sensation in vaginal area x1 month. ER gave her antibiotics and diflucan     HPI Theresa Garcia is here for follow up of ED  visit on 05/09/2018 at [facility] when she presented with vaginal burning. Presumptive dx was candidiasis, flucoazole prescribed, urine negative for infection Currently on jardiance for heart failure , which will predispose to candidiasis Also has been maintained for years on acyclovir due to HSV2 positivity ROS See HPI  Denies recent fever or chills. Denies sinus pressure, nasal congestion, ear pain or sore throat. Denies chest congestion, productive cough or wheezing. Denies chest pains, palpitations and leg swelling Denies abdominal pain, nausea, vomiting,diarrhea or constipation.   Denies dysuria, frequency, hesitancy or incontinence. Denies joint pain, swelling and limitation in mobility. Denies headaches, seizures, numbness, or tingling. Denies uncontrolled depression, anxiety or insomnia.  PE  BP 110/69   Pulse 73   Ht 5\' 6"  (1.676 m)   Wt 176 lb 12.8 oz (80.2 kg)   SpO2 93%   BMI 28.54 kg/m   Patient alert and oriented and in no cardiopulmonary distress.  HEENT: No facial asymmetry, EOMI,     Neck supple .  Chest: Clear to auscultation bilaterally.  CVS: S1, S2 no murmurs, no S3.Regular rate.  ABD: Soft non tender.   Ext: No edema  MS: Adequate ROM spine, shoulders, hips and knees.  Skin: Intact, no ulcerations or rash noted.  Psych: Good eye contact, normal affect. Memory intact not anxious or depressed appearing.  CNS: CN 2-12 intact, power,  normal throughout.no focal deficits noted.   Assessment & Plan  No problem-specific Assessment & Plan notes found for this encounter.

## 2023-05-29 ENCOUNTER — Other Ambulatory Visit: Payer: Self-pay | Admitting: Family Medicine

## 2023-05-31 DIAGNOSIS — N760 Acute vaginitis: Secondary | ICD-10-CM | POA: Insufficient documentation

## 2023-05-31 DIAGNOSIS — E559 Vitamin D deficiency, unspecified: Secondary | ICD-10-CM | POA: Insufficient documentation

## 2023-05-31 NOTE — Assessment & Plan Note (Signed)
 Maintained on acyclovir dailyfor over 10 years

## 2023-06-01 ENCOUNTER — Encounter: Payer: Self-pay | Admitting: Family Medicine

## 2023-06-01 NOTE — Assessment & Plan Note (Signed)
 Patient in for follow up of recent ED visit at [facility] , Aaron Edelman on 05/09/2023 Discharge summary, and laboratory and radiology data are reviewed, and any questions or concerns  are discussed. Specific issues requiring follow up are specifically addressed.

## 2023-06-01 NOTE — Assessment & Plan Note (Signed)
 Controlled, no change in medication

## 2023-06-01 NOTE — Assessment & Plan Note (Signed)
 Updated lab needed at/ before next visit.

## 2023-06-01 NOTE — Assessment & Plan Note (Signed)
 New  complaint treated presumptively for candidiasis as on jardiance, still symptomatic , nu swab sent, and will refer for Gyne eval andmanagement

## 2023-06-01 NOTE — Assessment & Plan Note (Signed)
 Hyperlipidemia:Low fat diet discussed and encouraged.   Lipid Panel  Lab Results  Component Value Date   CHOL 85 07/03/2022   HDL 35 (L) 07/03/2022   LDLCALC 38 07/03/2022   TRIG 61 07/03/2022   CHOLHDL 2.4 07/03/2022     Updated lab needed at/ before next visit.

## 2023-06-15 ENCOUNTER — Ambulatory Visit (INDEPENDENT_AMBULATORY_CARE_PROVIDER_SITE_OTHER): Payer: Medicare Other

## 2023-06-15 VITALS — Ht 66.0 in | Wt 178.0 lb

## 2023-06-15 DIAGNOSIS — Z780 Asymptomatic menopausal state: Secondary | ICD-10-CM

## 2023-06-15 DIAGNOSIS — Z0000 Encounter for general adult medical examination without abnormal findings: Secondary | ICD-10-CM

## 2023-06-15 NOTE — Progress Notes (Signed)
 Please attest and cosign this visit due to patients primary care provider not being in the office at the time the visit was completed.  Because this visit was a virtual/telehealth visit,  certain criteria was not obtained, such a blood pressure, CBG if applicable, and timed get up and go. Any medications not marked as "taking" were not mentioned during the medication reconciliation part of the visit. Any vitals not documented were not able to be obtained due to this being a telehealth visit or patient was unable to self-report a recent blood pressure reading due to a lack of equipment at home via telehealth. Vitals that have been documented are verbally provided by the patient.   Subjective:   Theresa CALEDONIA Garcia is a 83 y.o. who presents for a Medicare Wellness preventive visit.  Visit Complete: Virtual I connected with  Mele M Polka on 06/15/23 by a audio enabled telemedicine application and verified that I am speaking with the correct person using two identifiers.  Patient Location: Home  Provider Location: Home Office  I discussed the limitations of evaluation and management by telemedicine. The patient expressed understanding and agreed to proceed.  Vital Signs: Because this visit was a virtual/telehealth visit, some criteria may be missing or patient reported. Any vitals not documented were not able to be obtained and vitals that have been documented are patient reported.  VideoDeclined- This patient declined Librarian, academic. Therefore the visit was completed with audio only.  Persons Participating in Visit: Patient.  AWV Questionnaire: No: Patient Medicare AWV questionnaire was not completed prior to this visit.  Cardiac Risk Factors include: advanced age (>48men, >20 women);hypertension;dyslipidemia;sedentary lifestyle;Other (see comment), Risk factor comments: CHF     Objective:    Today's Vitals   06/15/23 1143  Weight: 178 lb (80.7 kg)   Height: 5\' 6"  (1.676 m)   Body mass index is 28.73 kg/m.     06/15/2023   11:45 AM 07/16/2022   12:42 PM 07/15/2022    5:20 AM 07/02/2022   11:11 AM 01/27/2022   12:14 PM 10/03/2020    3:03 PM 04/15/2020    4:28 PM  Advanced Directives  Does Patient Have a Medical Advance Directive? No No No No Yes Yes No  Does patient want to make changes to medical advance directive?     No - Patient declined    Would patient like information on creating a medical advance directive? No - Patient declined  No - Patient declined No - Patient declined       Current Medications (verified) Outpatient Encounter Medications as of 06/15/2023  Medication Sig   acetaminophen  (TYLENOL ) 500 MG tablet Take 1,000 mg by mouth every 6 (six) hours as needed for mild pain or moderate pain.    acyclovir  (ZOVIRAX ) 400 MG tablet Take 1 tablet (400 mg total) by mouth daily.   alendronate  (FOSAMAX ) 70 MG tablet TAKE 1 TABLET BY MOUTH EVERY 7 days on an empty stomach with a full glass of water .   atorvastatin  (LIPITOR) 20 MG tablet TAKE 1 TABLET BY MOUTH IN THE  EVENING   CALCIUM  600+D3 600-20 MG-MCG TABS take 1 tablet once daily   ELIQUIS  5 MG TABS tablet TAKE 1 TABLET BY MOUTH TWICE  DAILY   empagliflozin  (JARDIANCE ) 10 MG TABS tablet Take 1 tablet (10 mg total) by mouth daily.   FLUoxetine  (PROZAC ) 10 MG capsule Take 1 capsule (10 mg total) by mouth daily.   furosemide  (LASIX ) 40 MG tablet take 1 tablet (  40 milligram total) by mouth daily.   spironolactone  (ALDACTONE ) 25 MG tablet Take 1 tablet (25 mg total) by mouth daily.   temazepam  (RESTORIL ) 15 MG capsule Take 1 capsule (15 mg total) by mouth at bedtime as needed for sleep.   vitamin C (ASCORBIC ACID) 500 MG tablet Take 500 mg by mouth daily.   Vitamin D , Ergocalciferol , (DRISDOL ) 1.25 MG (50000 UNIT) CAPS capsule TAKE 1 CAPSULE BY MOUTH ONCE  WEEKLY   No facility-administered encounter medications on file as of 06/15/2023.    Allergies (verified) Meclizine  and  Penicillins   History: Past Medical History:  Diagnosis Date   (HFpEF) heart failure with preserved ejection fraction (HCC)    A-fib (HCC)    Dx: 01/2022   Breast cancer (HCC) 02/24/2000   Bronchitis    Cancer (HCC)    CHF (congestive heart failure) (HCC)    Chronic back pain    Depression    Dysrhythmia    GERD (gastroesophageal reflux disease)    Hematuria    Hyperlipidemia    Hypertension    Insomnia    Kidney stone    Personal history of chemotherapy    Personal history of radiation therapy    Pleural effusion    Pneumonia    Thyroid  goiter    Vertigo    Past Surgical History:  Procedure Laterality Date   ABDOMINAL HYSTERECTOMY     BREAST LUMPECTOMY Left 2002   BREAST SURGERY     left   CYSTOSCOPY/RETROGRADE/URETEROSCOPY/STONE EXTRACTION WITH BASKET  01/07/2011   Procedure: CYSTOSCOPY/RETROGRADE/URETEROSCOPY/STONE EXTRACTION WITH BASKET;  Surgeon: Mohammad I Javaid;  Location: [facility];  Service: Urology;  Laterality: Left;  Balloon Dilatation; stone given to family per MD   RIGHT/LEFT HEART CATH AND CORONARY ANGIOGRAPHY N/A 07/15/2022   Procedure: RIGHT/LEFT HEART CATH AND CORONARY ANGIOGRAPHY;  Surgeon: Mardell Shade, MD;  Location: [facility]ASIVE [facility];  Service: Cardiovascular;  Laterality: N/A;   TEE WITHOUT CARDIOVERSION N/A 07/16/2022   Procedure: TRANSESOPHAGEAL ECHOCARDIOGRAM;  Surgeon: Mardell Shade, MD;  Location: [facility] INVASIVE CV LAB;  Service: Cardiovascular;  Laterality: N/A;   TUBAL LIGATION     Family History  Problem Relation Age of Onset   COPD Father    COPD Sister    COPD Sister    Diabetes Sister    Diabetes Brother    Seizures Mother    Bone cancer Brother    Heart disease Brother    Aneurysm Brother        brain   HIV/AIDS Brother    Social History   Socioeconomic History   Marital status: Divorced    Spouse name: Not on file   Number of children: 2   Years of education: Not on file   Highest education level: 12th grade   Occupational History   Not on file  Tobacco Use   Smoking status: Former    Current packs/day: 0.00    Average packs/day: 1 pack/day for 5.0 years (5.0 ttl pk-yrs)    Types: Cigarettes    Start date: 09/08/1975    Quit date: 09/07/1980    Years since quitting: 42.7   Smokeless tobacco: Never   Tobacco comments:    quit 25+ years ago  Vaping Use   Vaping status: Never Used  Substance and Sexual Activity   Alcohol use: No    Alcohol/week: 0.0 standard drinks of alcohol   Drug use: No   Sexual activity: Not Currently  Other Topics Concern   Not  on file  Social History Narrative   1 child deceased   Social Drivers of Corporate investment banker Strain: Low Risk  (06/15/2023)   Overall Financial Resource Strain (CARDIA)    Difficulty of Paying Living Expenses: Not hard at all  Food Insecurity: No Food Insecurity (06/15/2023)   Hunger Vital Sign    Worried About Running Out of Food in the Last Year: Never true    Ran Out of Food in the Last Year: Never true  Transportation Needs: No Transportation Needs (06/15/2023)   PRAPARE - Administrator, Civil Service (Medical): No    Lack of Transportation (Non-Medical): No  Physical Activity: Inactive (06/15/2023)   Exercise Vital Sign    Days of Exercise per Week: 0 days    Minutes of Exercise per Session: 0 min  Stress: No Stress Concern Present (06/15/2023)   [employer] of Occupational Health - Occupational Stress Questionnaire    Feeling of Stress : Not at all  Social Connections: Socially Isolated (06/15/2023)   Social Connection and Isolation Panel [NHANES]    Frequency of Communication with Friends and Family: Three times a week    Frequency of Social Gatherings with Friends and Family: Never    Attends Religious Services: Never    Database administrator or Organizations: No    Attends Engineer, structural: Never    Marital Status: Divorced    Tobacco Counseling Counseling given: Yes Tobacco  comments: quit 25+ years ago    Clinical Intake:  Pre-visit preparation completed: Yes  Pain : No/denies pain     BMI - recorded: 28.73 Nutritional Status: BMI 25 -29 Overweight Nutritional Risks: None Diabetes: No  No results found for: "HGBA1C"   How often do you need to have someone help you when you read instructions, pamphlets, or other written materials from your doctor or pharmacy?: 1 - Never  Interpreter Needed?: No  Information entered by :: Sally Crazier CMa   Activities of Daily Living     06/15/2023   11:45 AM 07/15/2022    5:28 AM  In your present state of health, do you have any difficulty performing the following activities:  Hearing? 0   Vision? 0   Difficulty concentrating or making decisions? 0   Walking or climbing stairs? 1   Comment uses cane   Dressing or bathing? 0   Doing errands, shopping? 0 1  Comment  her sister or other relatives drive her  Preparing Food and eating ? N   Using the Toilet? N   In the past six months, have you accidently leaked urine? N   Do you have problems with loss of bowel control? N   Managing your Medications? N   Managing your Finances? N   Housekeeping or managing your Housekeeping? N     Patient Care Team: Towanda Fret, MD as PCP - General Mallipeddi, Kennyth Pean, MD as PCP - Cardiology (Cardiology)  Indicate any recent Medical Services you may have received from other than [facility] providers in the past year (date may be approximate).     Assessment:   This is a routine wellness examination for [place].  Hearing/Vision screen Hearing Screening - Comments:: Patient denies any hearing difficulties.   Vision Screening - Comments:: Wears rx glasses - up to date with routine eye exams  Patient sees [facility]    Goals Addressed             This Visit's  Progress    Patient Stated       To remain active, healthy, and independent.        Depression Screen     06/15/2023   11:51 AM 05/19/2023     1:32 PM 12/25/2022    3:10 PM 08/06/2022    3:24 PM 06/30/2022    2:11 PM 04/02/2022    8:54 AM 02/06/2022    8:49 AM  PHQ 2/9 Scores  PHQ - 2 Score 0 1 1 0 0 0 0  PHQ- 9 Score 0   0 10 1 3     Fall Risk     06/15/2023   11:45 AM 05/19/2023    1:32 PM 12/25/2022    3:10 PM 08/06/2022    3:24 PM 06/30/2022    2:11 PM  Fall Risk   Falls in the past year? 0 0 0 0 0  Number falls in past yr: 0 0 0 0 0  Injury with Fall? 0 0 0 0 0  Risk for fall due to : No Fall Risks  No Fall Risks No Fall Risks No Fall Risks  Follow up Falls prevention discussed;Falls evaluation completed  Falls evaluation completed Falls evaluation completed Falls evaluation completed    MEDICARE RISK AT HOME:  Medicare Risk at Home Any stairs in or around the home?: Yes If so, are there any without handrails?: No Home free of loose throw rugs in walkways, pet beds, electrical cords, etc?: Yes Adequate lighting in your home to reduce risk of falls?: Yes Life alert?: No Use of a cane, walker or w/c?: Yes Grab bars in the bathroom?: Yes Shower chair or bench in shower?: Yes Elevated toilet seat or a handicapped toilet?: Yes  TIMED UP AND GO:  Was the test performed?  No  Cognitive Function: 6CIT completed    05/27/2016    2:35 PM 07/24/2015    4:03 PM  MMSE - Mini Mental State Exam  Orientation to time 5 5  Orientation to Place 5 5  Registration 3 3  Attention/ Calculation 5 5  Recall 3 2  Language- name 2 objects 2 2  Language- repeat 1 1  Language- follow 3 step command 3 3  Language- read & follow direction 1 1  Write a sentence 1 1  Copy design 1 1  Total score 30 29        06/15/2023   11:44 AM 10/03/2020    3:07 PM 09/21/2019    2:29 PM 09/20/2018    9:05 AM 09/13/2017    1:52 PM  6CIT Screen  What Year? 0 points 0 points  0 points 0 points  What month? 0 points 0 points 0 points 0 points 0 points  What time? 0 points 0 points 0 points 0 points 0 points  Count back from 20 0 points 0 points 0  points 0 points 0 points  Months in reverse 0 points 0 points 0 points 0 points 0 points  Repeat phrase 0 points 0 points 0 points 2 points 0 points  Total Score 0 points 0 points  2 points 0 points    Immunizations Immunization History  Administered Date(s) Administered   Fluad Quad(high Dose 65+) 11/07/2018, 11/03/2019, 11/13/2020, 12/10/2021   Fluad Trivalent(High Dose 65+) 12/08/2022   Influenza Split 12/18/2010   Influenza Whole 01/13/2010   Influenza,inj,Quad PF,6+ Mos 11/28/2012, 11/27/2013, 12/17/2014, 12/30/2015, 11/30/2016   Influenza-Unspecified 12/24/2017   Moderna Sars-Covid-2 Vaccination 03/31/2019, 04/29/2019, 12/18/2019, 07/11/2020   Pneumococcal Conjugate-13  03/14/2014   Pneumococcal Polysaccharide-23 10/10/2012    Screening Tests Health Maintenance  Topic Date Due   DTaP/Tdap/Td (1 - Tdap) Never done   DEXA SCAN  08/22/2021   Zoster Vaccines- Shingrix (2 of 2) 12/26/2021   MAMMOGRAM  09/06/2022   COVID-19 Vaccine (5 - 2024-25 season) 10/25/2022   INFLUENZA VACCINE  09/24/2023   Medicare Annual Wellness (AWV)  06/14/2024   Pneumonia Vaccine 18+ Years old  Completed   HPV VACCINES  Aged Out   Meningococcal B Vaccine  Aged Out   Hepatitis C Screening  Discontinued    Health Maintenance  Health Maintenance Due  Topic Date Due   DTaP/Tdap/Td (1 - Tdap) Never done   DEXA SCAN  08/22/2021   Zoster Vaccines- Shingrix (2 of 2) 12/26/2021   MAMMOGRAM  09/06/2022   COVID-19 Vaccine (5 - 2024-25 season) 10/25/2022   Health Maintenance Items Addressed: DEXA scheduled  Additional Screening:  Vision Screening: Recommended annual ophthalmology exams for early detection of glaucoma and other disorders of the eye.  Dental Screening: Recommended annual dental exams for proper oral hygiene  Community Resource Referral / Chronic Care Management: CRR required this visit?  No   CCM required this visit?  No     Plan:     I have personally reviewed and noted  the following in the patient's chart:   Medical and social history Use of alcohol, tobacco or illicit drugs  Current medications and supplements including opioid prescriptions. Patient is not currently taking opioid prescriptions. Functional ability and status Nutritional status Physical activity Advanced directives List of other physicians Hospitalizations, surgeries, and ER visits in previous 12 months Vitals Screenings to include cognitive, depression, and falls Referrals and appointments  In addition, I have reviewed and discussed with patient certain preventive protocols, quality metrics, and best practice recommendations. A written personalized care plan for preventive services as well as general preventive health recommendations were provided to patient.      Carmon Brigandi, CMA   06/15/2023   After Visit Summary: (Mail) Due to this being a telephonic visit, the after visit summary with patients personalized plan was offered to patient via mail   Notes: Nothing significant to report at this time.

## 2023-06-15 NOTE — Patient Instructions (Addendum)
 Theresa Garcia , Thank you for taking time to come for your Medicare Wellness Visit. I appreciate your ongoing commitment to your health goals. Please review the following plan we discussed and let me know if I can assist you in the future.   Referrals/Orders/Follow-Ups/Clinician Recommendations:  Next Medicare AWV: June 19, 2024 at 11:20 am virtual visit.   Your osteoporosis screening has been scheduled for: Jun 30, 2023 at 1:[facility]pm [facility] -Discontinue any medications that contain calcium  at least 48 hours (2 days) prior to your scan. -Bring list of medications you are currently taking including any supplements.  -Make sure you wear comfortable 2 piece clothing   Make sure to bring picture ID and insurance card.  Bring list of medications you are currently taking including any supplements.  If you are having a bone density scan, please discontinue any medications that contain calcium  at least 48 hours (2 days) prior to your scan.   This is a list of the screening recommended for you and due dates:  Health Maintenance  Topic Date Due   DTaP/Tdap/Td vaccine (1 - Tdap) Never done   DEXA scan (bone density measurement)  08/22/2021   Zoster (Shingles) Vaccine (2 of 2) 12/26/2021   Mammogram  09/06/2022   COVID-19 Vaccine (5 - 2024-25 season) 10/25/2022   Flu Shot  09/24/2023   Medicare Annual Wellness Visit  06/14/2024   Pneumonia Vaccine  Completed   HPV Vaccine  Aged Out   Meningitis B Vaccine  Aged Out   Hepatitis C Screening  Discontinued    Advanced directives: (Declined) Advance directive discussed with you today. Even though you declined this today, please call our office should you change your mind, and we can give you the proper paperwork for you to fill out. Advance Care Planning is important because it:  [x]  Makes sure you receive the medical care that is consistent with your values, goals, and preferences  [x]  It provides guidance to your family and loved  ones and it also reduces their decisional burden about whether or not they are making the right decisions based on what you want done  Follow the link provided in your after visit summary or read over the paperwork we have mailed to you to help you started getting your Advance Directives in place. If you need assistance in completing these, please reach out to us  so that we can help you!   Next Medicare Annual Wellness Visit scheduled for next year: yes  Understanding Your Risk for Falls Millions of people have serious injuries from falls each year. It is important to understand your risk of falling. Talk with your health care provider about your risk and what you can do to lower it. If you do have a serious fall, make sure to tell your provider. Falling once raises your risk of falling again. How can falls affect me? Serious injuries from falls are common. These include: Broken bones, such as hip fractures. Head injuries, such as traumatic brain injuries (TBI) or concussions. A fear of falling can cause you to avoid activities and stay at home. This can make your muscles weaker and raise your risk for a fall. What can increase my risk? There are a number of risk factors that increase your risk for falling. The more risk factors you have, the higher your risk of falling. Serious injuries from a fall happen most often to people who are older than 81 years old. Teenagers and young adults ages 7-29  are also at higher risk. Common risk factors include: Weakness in the lower body. Being generally weak or confused due to long-term (chronic) illness. Dizziness or balance problems. Poor vision. Medicines that cause dizziness or drowsiness. These may include: Medicines for your blood pressure, heart, anxiety, insomnia, or swelling (edema). Pain medicines. Muscle relaxants. Other risk factors include: Drinking alcohol. Having had a fall in the past. Having foot pain or wearing improper  footwear. Working at a dangerous job. Having any of the following in your home: Tripping hazards, such as floor clutter or loose rugs. Poor lighting. Pets. Having dementia or memory loss. What actions can I take to lower my risk of falling?     Physical activity Stay physically fit. Do strength and balance exercises. Consider taking a regular class to build strength and balance. Yoga and tai chi are good options. Vision Have your eyes checked every year and your prescription for glasses or contacts updated as needed. Shoes and walking aids Wear non-skid shoes. Wear shoes that have rubber soles and low heels. Do not wear high heels. Do not walk around the house in socks or slippers. Use a cane or walker as told by your provider. Home safety Attach secure railings on both sides of your stairs. Install grab bars for your bathtub, shower, and toilet. Use a non-skid mat in your bathtub or shower. Attach bath mats securely with double-sided, non-slip rug tape. Use good lighting in all rooms. Keep a flashlight near your bed. Make sure there is a clear path from your bed to the bathroom. Use night-lights. Do not use throw rugs. Make sure all carpeting is taped or tacked down securely. Remove all clutter from walkways and stairways, including extension cords. Repair uneven or broken steps and floors. Avoid walking on icy or slippery surfaces. Walk on the grass instead of on icy or slick sidewalks. Use ice melter to get rid of ice on walkways in the winter. Use a cordless phone. Questions to ask your health care provider Can you help me check my risk for a fall? Do any of my medicines make me more likely to fall? Should I take a vitamin D  supplement? What exercises can I do to improve my strength and balance? Should I make an appointment to have my vision checked? Do I need a bone density test to check for weak bones (osteoporosis)? Would it help to use a cane or a walker? Where to find  more information Centers for Disease Control and Prevention, STEADI: TonerPromos.no Community-Based Fall Prevention Programs: TonerPromos.no [employer] on Aging: BaseRingTones.pl Contact a health care provider if: You fall at home. You are afraid of falling at home. You feel weak, drowsy, or dizzy. This information is not intended to replace advice given to you by your health care provider. Make sure you discuss any questions you have with your health care provider. Document Revised: 10/13/2021 Document Reviewed: 10/13/2021 Elsevier Patient Education  2024 [employer].

## 2023-06-28 ENCOUNTER — Other Ambulatory Visit: Payer: Self-pay | Admitting: Family Medicine

## 2023-06-29 ENCOUNTER — Other Ambulatory Visit: Payer: Self-pay | Admitting: Family Medicine

## 2023-06-30 ENCOUNTER — Ambulatory Visit (HOSPITAL_COMMUNITY)
Admission: RE | Admit: 2023-06-30 | Discharge: 2023-06-30 | Disposition: A | Discharge status: Home / Self Care | Source: Ambulatory Visit | Attending: Internal Medicine | Admitting: Internal Medicine

## 2023-06-30 DIAGNOSIS — Z780 Asymptomatic menopausal state: Secondary | ICD-10-CM | POA: Insufficient documentation

## 2023-07-01 ENCOUNTER — Other Ambulatory Visit (HOSPITAL_COMMUNITY): Payer: Self-pay | Admitting: Family Medicine

## 2023-07-01 DIAGNOSIS — Z1231 Encounter for screening mammogram for malignant neoplasm of breast: Secondary | ICD-10-CM

## 2023-07-02 ENCOUNTER — Encounter (HOSPITAL_COMMUNITY): Payer: Self-pay

## 2023-07-02 ENCOUNTER — Ambulatory Visit (HOSPITAL_COMMUNITY)
Admission: RE | Admit: 2023-07-02 | Discharge: 2023-07-02 | Disposition: A | Discharge status: Home / Self Care | Source: Ambulatory Visit | Attending: Family Medicine | Admitting: Family Medicine

## 2023-07-02 DIAGNOSIS — Z1231 Encounter for screening mammogram for malignant neoplasm of breast: Secondary | ICD-10-CM | POA: Insufficient documentation

## 2023-07-07 ENCOUNTER — Ambulatory Visit: Payer: Self-pay | Admitting: Family Medicine

## 2023-07-07 ENCOUNTER — Encounter: Payer: Self-pay | Admitting: Family Medicine

## 2023-07-07 DIAGNOSIS — I10 Essential (primary) hypertension: Secondary | ICD-10-CM

## 2023-07-07 DIAGNOSIS — R946 Abnormal results of thyroid function studies: Secondary | ICD-10-CM

## 2023-07-07 DIAGNOSIS — R7303 Prediabetes: Secondary | ICD-10-CM

## 2023-07-07 NOTE — Telephone Encounter (Signed)
 Mailed

## 2023-07-11 ENCOUNTER — Other Ambulatory Visit: Payer: Self-pay | Admitting: Family Medicine

## 2023-07-17 ENCOUNTER — Other Ambulatory Visit: Payer: Self-pay | Admitting: Family Medicine

## 2023-07-23 ENCOUNTER — Other Ambulatory Visit: Payer: Self-pay | Admitting: Family Medicine

## 2023-07-25 ENCOUNTER — Other Ambulatory Visit: Payer: Self-pay | Admitting: Family Medicine

## 2023-07-26 ENCOUNTER — Other Ambulatory Visit: Payer: Self-pay | Admitting: Family Medicine

## 2023-08-03 ENCOUNTER — Other Ambulatory Visit: Payer: Self-pay | Admitting: Family Medicine

## 2023-08-21 ENCOUNTER — Other Ambulatory Visit: Payer: Self-pay | Admitting: Family Medicine

## 2023-08-23 ENCOUNTER — Other Ambulatory Visit: Payer: Self-pay | Admitting: Family Medicine

## 2023-09-15 ENCOUNTER — Telehealth: Payer: Self-pay

## 2023-09-15 NOTE — Telephone Encounter (Signed)
 Lab order faxed to [facility]

## 2023-09-15 NOTE — Telephone Encounter (Signed)
 Copied from CRM [id number]. Topic: Clinical - Request for Lab/Test Order >> Sep 14, 2023  4:37 PM DeAngela L wrote: Reason for CRM: calling to ask if the patient could have her blood work drawn this is not a Mining engineer [facility] or [facility] and would like to ask if the patient needs paperwork to print out and take into the appt   Pt brother Nancyann [phone] (M)

## 2023-09-16 ENCOUNTER — Other Ambulatory Visit: Payer: Self-pay | Admitting: Family Medicine

## 2023-09-18 ENCOUNTER — Other Ambulatory Visit: Payer: Self-pay | Admitting: Family Medicine

## 2023-09-21 ENCOUNTER — Encounter: Payer: Self-pay | Admitting: Family Medicine

## 2023-09-21 ENCOUNTER — Ambulatory Visit (INDEPENDENT_AMBULATORY_CARE_PROVIDER_SITE_OTHER): Admitting: Family Medicine

## 2023-09-21 VITALS — BP 127/78 | HR 72 | Resp 16 | Ht 66.0 in | Wt 177.0 lb

## 2023-09-21 DIAGNOSIS — I5032 Chronic diastolic (congestive) heart failure: Secondary | ICD-10-CM | POA: Diagnosis not present

## 2023-09-21 DIAGNOSIS — Z0001 Encounter for general adult medical examination with abnormal findings: Secondary | ICD-10-CM | POA: Insufficient documentation

## 2023-09-21 NOTE — Patient Instructions (Signed)
 F/U in early December  Nurse visit for flu vaccine in September  You Need TdaP , RSV and Covid vaccines, take them separately pls , they are  available from your pharmacy  Nurse pls send for and document shingrix vaccines she has received  Congrats on improved blood sugar  Please after dinner nothing other than a serving of fruit by 7 pm for a 10 o clock bedtime  Take melatonin 5 mg tablet at 9 pm amd no light or sound in bedroom when going to sleep  Weigh daily and limit/ exclude salt A 2 pound weight gain overnight is not normal  You are referred to Cardiology for folow up  It is important that you exercise regularly , start two 10 minute session a home as able please  Keep doing word search puzzles  Sorry to hear of recent loss of your friend, may you be comforted over time  Thankful you look content and are in better health than 1 year ago!  Thanks for choosing [facility], we consider it a privelige to serve you.

## 2023-09-21 NOTE — Progress Notes (Unsigned)
    Theresa Garcia     MRN: [id number]      DOB: [birth year]  Chief Complaint  Patient presents with   Annual Exam    cpe    HPI: Patient is in for annual physical exam. No other health concerns are expressed or addressed at the visit. Recent labs,  are reviewed. Immunization is reviewed , and  updated if needed.   PE: Pleasant  female, alert and oriented x 3, in no cardio-pulmonary distress. Afebrile. HEENT No facial trauma or asymetry. Sinuses non tender.  Extra occullar muscles intact.. External ears normal, . Neck: decreased rOM, no adenopathy,JVD or thyromegaly.No bruits.  Chest: Clear to ascultation bilaterally.No crackles or wheezes. Non tender to palpation  Breast: Not  examined, normal mammogram 06/2023, and asymptomatic  Cardiovascular system; Heart sounds normal,  S1 and  S2 ,no S3.  No murmur, or thrill.  Peripheral pulses normal.  Abdomen: Soft, non tender, no organomegaly or masses. No bruits. Bowel sounds normal. No guarding, tenderness or rebound.     Musculoskeletal exam: Decreased though ADEQUATE  ROM of spine, hips , shoulders and knees. No deformity ,swelling or crepitus noted. No muscle wasting or atrophy.   Neurologic: Cranial nerves 2 to 12 intact. Power, tone ,sensation  normal throughout. Mild  disturbance in gait. No tremor.  Skin: Intact, no ulceration, erythema , scaling or rash noted. Pigmentation normal throughout  Psych; Normal mood and affect.    Assessment & Plan:  No problem-specific Assessment & Plan notes found for this encounter.

## 2023-09-21 NOTE — Assessment & Plan Note (Signed)
Needs cardiology f/u

## 2023-09-21 NOTE — Assessment & Plan Note (Signed)

## 2023-09-22 ENCOUNTER — Encounter: Payer: Self-pay | Admitting: Family Medicine

## 2023-10-15 ENCOUNTER — Other Ambulatory Visit: Payer: Self-pay | Admitting: Family Medicine

## 2023-10-18 ENCOUNTER — Other Ambulatory Visit: Payer: Self-pay | Admitting: Family Medicine

## 2023-10-26 ENCOUNTER — Encounter: Payer: Self-pay | Admitting: Family Medicine

## 2023-10-28 ENCOUNTER — Ambulatory Visit: Admitting: Internal Medicine

## 2023-10-29 ENCOUNTER — Ambulatory Visit

## 2023-11-16 ENCOUNTER — Other Ambulatory Visit: Payer: Self-pay | Admitting: Family Medicine

## 2023-11-17 ENCOUNTER — Other Ambulatory Visit: Payer: Self-pay | Admitting: Family Medicine

## 2023-11-19 ENCOUNTER — Telehealth: Payer: Self-pay | Admitting: Family Medicine

## 2023-11-19 NOTE — Telephone Encounter (Unsigned)
 Copied from CRM #[id number]. Topic: General - Other >> Nov 19, 2023  3:55 PM Emylou G wrote: Reason for CRM: Patient called.. wants to know if Dr Antonetta is okay, wants to send a card.SABRA Pls fwd to [place].SABRA

## 2023-12-09 ENCOUNTER — Other Ambulatory Visit: Payer: Self-pay | Admitting: Family Medicine

## 2023-12-27 ENCOUNTER — Ambulatory Visit: Attending: Internal Medicine | Admitting: Internal Medicine

## 2023-12-27 ENCOUNTER — Encounter: Payer: Self-pay | Admitting: Internal Medicine

## 2023-12-27 VITALS — BP 126/74 | HR 52 | Ht 67.0 in | Wt 179.8 lb

## 2023-12-27 DIAGNOSIS — T829XXA Unspecified complication of cardiac and vascular prosthetic device, implant and graft, initial encounter: Secondary | ICD-10-CM

## 2023-12-27 DIAGNOSIS — I5032 Chronic diastolic (congestive) heart failure: Secondary | ICD-10-CM | POA: Diagnosis not present

## 2023-12-27 DIAGNOSIS — T829XXD Unspecified complication of cardiac and vascular prosthetic device, implant and graft, subsequent encounter: Secondary | ICD-10-CM | POA: Diagnosis not present

## 2023-12-27 NOTE — Progress Notes (Signed)
 Cardiology Office Note  Date: 12/27/2023   ID: Theresa Garcia, Theresa Garcia January 26, 1941, MRN [id number]  PCP:  Antonetta Rollene BRAVO, MD  Cardiologist:  Morrell Fluke P Akil Hoos, MD Electrophysiologist:  None    History of Present Illness: Theresa Garcia is a 83 y.o. female known to have HTN, HLD, A-fib, severe atrial functional TR s/p 56 mm Evoque tricuspid prosthetic valve implanted at [facility] in October 2024 he is here today for follow-up visit.  Patient has had multiple hospital admissions for HFpEF from right heart failure (due to severe TR).  Patient underwent 56 mm Evoque tricuspid prosthetic valve plantation at [facility] in October 2024.  No ADHF hospitalizations since then.  Currently on Lasix  40 mg once daily, Jardiance  10 mg once daily, spironolactone  25 mg once daily.  Does not have any issues.  No DOE, orthopnea, PND, leg swelling.  No angina.  No dizziness, syncope, palpitations.  Past Medical History:  Diagnosis Date   (HFpEF) heart failure with preserved ejection fraction (HCC)    A-fib (HCC)    Dx: 01/2022   Acute congestive heart failure (HCC) 01/27/2022   preserved EF 01/2022     Acute right heart failure (HCC) 07/02/2022   Breast cancer (HCC) 02/24/2000   left   Bronchitis    Cancer (HCC)    CHF (congestive heart failure) (HCC)    Chronic back pain    Depression    Dysrhythmia    GERD (gastroesophageal reflux disease)    Hematuria    Hyperlipidemia    Hypertension    Insomnia    Kidney stone    Personal history of chemotherapy    Personal history of radiation therapy    Pleural effusion    Pneumonia    Thyroid  goiter    Vertigo     Past Surgical History:  Procedure Laterality Date   ABDOMINAL HYSTERECTOMY     BREAST LUMPECTOMY Left 2002   CARDIAC VALVE REPLACEMENT  12/17/2022   CYSTOSCOPY/RETROGRADE/URETEROSCOPY/STONE EXTRACTION WITH BASKET  01/07/2011   Procedure: CYSTOSCOPY/RETROGRADE/URETEROSCOPY/STONE EXTRACTION WITH BASKET;  Surgeon: Mohammad I Javaid;   Location: [facility];  Service: Urology;  Laterality: Left;  Balloon Dilatation; stone given to family per MD   RIGHT/LEFT HEART CATH AND CORONARY ANGIOGRAPHY N/A 07/15/2022   Procedure: RIGHT/LEFT HEART CATH AND CORONARY ANGIOGRAPHY;  Surgeon: Cherrie Toribio SAUNDERS, MD;  Location: [facility]AS[facility] CV [facility];  Service: Cardiovascular;  Laterality: N/A;   TEE WITHOUT CARDIOVERSION N/A 07/16/2022   Procedure: TRANSESOPHAGEAL ECHOCARDIOGRAM;  Surgeon: Cherrie Toribio SAUNDERS, MD;  Location: [facility] INVASIVE CV LAB;  Service: Cardiovascular;  Laterality: N/A;   TUBAL LIGATION      Current Outpatient Medications  Medication Sig Dispense Refill   acetaminophen  (TYLENOL ) 500 MG tablet Take 1,000 mg by mouth every 6 (six) hours as needed for mild pain or moderate pain.      acyclovir  (ZOVIRAX ) 400 MG tablet take 1 tablet (400 milligram total) by mouth daily. 30 tablet 0   alendronate  (FOSAMAX ) 70 MG tablet take 1 tablet by mouth every 7 days on an empty stomach with a full glass of water . 12 tablet 0   atorvastatin  (LIPITOR) 20 MG tablet TAKE 1 TABLET BY MOUTH IN THE  EVENING 90 tablet 3   CALCIUM  600+D3 600-20 MG-MCG TABS take 1 tablet once daily 30 tablet 0   Calcium  Carb-Cholecalciferol  (OYSTER SHELL CALCIUM  W/D) 500-5 MG-MCG TABS take 1 tablet every day 60 tablet 0   ELIQUIS  5 MG TABS tablet Take 1 tablet by mouth  twice daily 180 tablet 0   FLUoxetine  (PROZAC ) 10 MG capsule TAKE 1 CAPSULE BY MOUTH DAILY 90 capsule 3   furosemide  (LASIX ) 40 MG tablet take 1 tablet (40 milligram total) by mouth daily. 30 tablet 0   JARDIANCE  10 MG TABS tablet take 1 tablet once a day. 30 tablet 0   spironolactone  (ALDACTONE ) 25 MG tablet take 1 tablet by mouth daily 30 tablet 0   vitamin C (ASCORBIC ACID) 500 MG tablet Take 500 mg by mouth daily.     Vitamin D , Ergocalciferol , (DRISDOL ) 1.25 MG (50000 UNIT) CAPS capsule take 1 capsule by mouth once weekly. 13 capsule 0   No current facility-administered medications for this visit.    Allergies:  Meclizine  and Penicillins   Social History: The patient  reports that she quit smoking about 43 years ago. Her smoking use included cigarettes. She started smoking about 48 years ago. She has a 5 pack-year smoking history. She has never used smokeless tobacco. She reports that she does not drink alcohol and does not use drugs.   Family History: The patient's family history includes Aneurysm in her brother; Bone cancer in her brother; COPD in her father, sister, and sister; Diabetes in her brother and sister; HIV/AIDS in her brother; Heart disease in her brother; Seizures in her mother.   ROS:  Please see the history of present illness. Otherwise, complete review of systems is positive for none  All other systems are reviewed and negative.   Physical Exam: VS:  There were no vitals taken for this visit., BMI There is no height or weight on file to calculate BMI.  Wt Readings from Last 3 Encounters:  09/21/23 177 lb 0.6 oz (80.3 kg)  06/15/23 178 lb (80.7 kg)  05/19/23 176 lb 12.8 oz (80.2 kg)    General: Patient appears comfortable at rest. HEENT: Conjunctiva and lids normal, oropharynx clear with moist mucosa. Neck: Supple, no elevated JVP or carotid bruits, no thyromegaly. Lungs: Clear to auscultation, nonlabored breathing at rest. Cardiac: Systolic murmur present Abdomen: Soft, nontender, no hepatomegaly, bowel sounds present, no guarding or rebound. Extremities: 1+ pitting edema, distal pulses 2+. Skin: Warm and dry. Musculoskeletal: No kyphosis. Neuropsychiatric: Alert and oriented x3, affect grossly appropriate.  Recent Labwork: No results found for requested labs within last 365 days.     Component Value Date/Time   CHOL 85 07/03/2022 0421   CHOL 149 07/04/2021 1341   TRIG 61 07/03/2022 0421   HDL 35 (L) 07/03/2022 0421   HDL 53 07/04/2021 1341   CHOLHDL 2.4 07/03/2022 0421   VLDL 12 07/03/2022 0421   LDLCALC 38 07/03/2022 0421   LDLCALC 78 07/04/2021 1341    LDLCALC 79 04/25/2019 1053    Assessment and Plan:  # Chronic diastolic heart failure # Severe TR s/p 56 mm Evoque tricuspid prosthetic valve implanted in October 2024 at [facility] with residual moderate RV systolic dysfunction and moderate RV enlargement. - No interval DOE, orthopnea, PND, leg swelling since prosthetic valve implantation. - Continue p.o. Lasix  40 mg once daily. - Continue Jardiance  10 mg once daily. - Continue spironolactone  25 mg once daily. - HF weight precautions recommended.  Patient verbalized understanding. - Last echocardiogram was in December 2024 at [facility] that showed normal tricuspid prosthesis with residual moderate RV systolic dysfunction and moderate RV enlargement.  Update echocardiogram.  Repeat every 1 year. - Dental cleanings twice per year, SBE prophylaxis prior to dental procedures.  # Persistent A-fib with slow ventricular response -  100% A-fib burden ranging from 44 to 128 bpm on the event monitor from 08/24/2022.  Not on rate controlling agents, continue Eliquis  5 mg twice daily.  30 minutes spent in reviewing prior medical records, more than 3 labs, discussion and documentation.   Medication Adjustments/Labs and Tests Ordered: Current medicines are reviewed at length with the patient today.  Concerns regarding medicines are outlined above.    Disposition:  Follow up 6 months  Signed Daulton Harbaugh Priya Talisa Petrak, MD, 12/27/2023 9:32 AM    [facility] [address] [place], [place], [place] [address]

## 2023-12-27 NOTE — Patient Instructions (Addendum)

## 2024-01-05 ENCOUNTER — Ambulatory Visit: Attending: Internal Medicine

## 2024-01-05 DIAGNOSIS — T829XXA Unspecified complication of cardiac and vascular prosthetic device, implant and graft, initial encounter: Secondary | ICD-10-CM

## 2024-01-05 DIAGNOSIS — I340 Nonrheumatic mitral (valve) insufficiency: Secondary | ICD-10-CM | POA: Diagnosis not present

## 2024-01-06 ENCOUNTER — Other Ambulatory Visit: Payer: Self-pay | Admitting: Family Medicine

## 2024-01-06 LAB — ECHOCARDIOGRAM COMPLETE
AR max vel: 2.36 cm2
AV Area VTI: 2.38 cm2
AV Area mean vel: 2.1 cm2
AV Mean grad: 4.4 mmHg
AV Peak grad: 8.8 mmHg
Ao pk vel: 1.48 m/s
Calc EF: 59 %
S' Lateral: 2.8 cm
Single Plane A2C EF: 60.7 %
Single Plane A4C EF: 53.7 %

## 2024-01-07 ENCOUNTER — Ambulatory Visit: Payer: Self-pay | Admitting: Internal Medicine

## 2024-01-10 ENCOUNTER — Other Ambulatory Visit: Payer: Self-pay | Admitting: Family Medicine

## 2024-01-28 ENCOUNTER — Ambulatory Visit: Payer: Self-pay | Admitting: Family Medicine

## 2024-02-03 ENCOUNTER — Other Ambulatory Visit: Payer: Self-pay | Admitting: Family Medicine

## 2024-02-09 ENCOUNTER — Other Ambulatory Visit: Payer: Self-pay | Admitting: Family Medicine

## 2024-02-09 ENCOUNTER — Encounter: Payer: Self-pay | Admitting: Family Medicine

## 2024-02-11 ENCOUNTER — Ambulatory Visit: Payer: Self-pay

## 2024-03-02 ENCOUNTER — Other Ambulatory Visit: Payer: Self-pay | Admitting: Family Medicine

## 2024-03-10 ENCOUNTER — Other Ambulatory Visit (HOSPITAL_COMMUNITY): Payer: Self-pay

## 2024-04-04 ENCOUNTER — Ambulatory Visit: Admitting: Family Medicine

## 2024-04-19 ENCOUNTER — Ambulatory Visit: Admitting: Family Medicine

## 2024-06-19 ENCOUNTER — Ambulatory Visit
